# Patient Record
Sex: Male | Born: 1935 | Race: White | Hispanic: No | Marital: Married | Smoking: Former smoker
Health system: Southern US, Community
[De-identification: ages and names within clinical notes are randomized; demographics above are authoritative.]

## PROBLEM LIST (undated history)

## (undated) DIAGNOSIS — I482 Chronic atrial fibrillation, unspecified: Secondary | ICD-10-CM

## (undated) DIAGNOSIS — E559 Vitamin D deficiency, unspecified: Secondary | ICD-10-CM

## (undated) DIAGNOSIS — I259 Chronic ischemic heart disease, unspecified: Secondary | ICD-10-CM

## (undated) DIAGNOSIS — E291 Testicular hypofunction: Secondary | ICD-10-CM

## (undated) DIAGNOSIS — Z95810 Presence of automatic (implantable) cardiac defibrillator: Secondary | ICD-10-CM

## (undated) DIAGNOSIS — J90 Pleural effusion, not elsewhere classified: Secondary | ICD-10-CM

## (undated) DIAGNOSIS — I4821 Permanent atrial fibrillation: Secondary | ICD-10-CM

## (undated) DIAGNOSIS — F0390 Unspecified dementia without behavioral disturbance: Secondary | ICD-10-CM

## (undated) DIAGNOSIS — C6290 Malignant neoplasm of unspecified testis, unspecified whether descended or undescended: Secondary | ICD-10-CM

## (undated) DIAGNOSIS — E119 Type 2 diabetes mellitus without complications: Secondary | ICD-10-CM

## (undated) DIAGNOSIS — I10 Essential (primary) hypertension: Secondary | ICD-10-CM

## (undated) DIAGNOSIS — J439 Emphysema, unspecified: Secondary | ICD-10-CM

## (undated) DIAGNOSIS — R918 Other nonspecific abnormal finding of lung field: Secondary | ICD-10-CM

## (undated) DIAGNOSIS — I5020 Unspecified systolic (congestive) heart failure: Secondary | ICD-10-CM

## (undated) DIAGNOSIS — E785 Hyperlipidemia, unspecified: Secondary | ICD-10-CM

## (undated) DIAGNOSIS — I2510 Atherosclerotic heart disease of native coronary artery without angina pectoris: Secondary | ICD-10-CM

## (undated) DIAGNOSIS — F03A0 Unspecified dementia, mild, without behavioral disturbance, psychotic disturbance, mood disturbance, and anxiety: Secondary | ICD-10-CM

## (undated) DIAGNOSIS — I00 Rheumatic fever without heart involvement: Secondary | ICD-10-CM

## (undated) DIAGNOSIS — F329 Major depressive disorder, single episode, unspecified: Secondary | ICD-10-CM

## (undated) DIAGNOSIS — A809 Acute poliomyelitis, unspecified: Secondary | ICD-10-CM

## (undated) DIAGNOSIS — F32A Depression, unspecified: Secondary | ICD-10-CM

## (undated) HISTORY — DX: Unspecified systolic (congestive) heart failure: I50.20

## (undated) HISTORY — DX: Acute poliomyelitis, unspecified: A80.9

## (undated) HISTORY — DX: Essential (primary) hypertension: I10

## (undated) HISTORY — DX: Emphysema, unspecified: J43.9

## (undated) HISTORY — DX: Pleural effusion, not elsewhere classified: J90

## (undated) HISTORY — DX: Unspecified dementia without behavioral disturbance: F03.90

## (undated) HISTORY — DX: Depression, unspecified: F32.A

## (undated) HISTORY — DX: Chronic atrial fibrillation, unspecified: I48.20

## (undated) HISTORY — DX: Unspecified dementia, mild, without behavioral disturbance, psychotic disturbance, mood disturbance, and anxiety: F03.A0

## (undated) HISTORY — DX: Hyperlipidemia, unspecified: E78.5

## (undated) HISTORY — DX: Rheumatic fever without heart involvement: I00

## (undated) HISTORY — DX: Malignant neoplasm of unspecified testis, unspecified whether descended or undescended: C62.90

## (undated) HISTORY — DX: Vitamin D deficiency, unspecified: E55.9

## (undated) HISTORY — DX: Type 2 diabetes mellitus without complications: E11.9

## (undated) HISTORY — DX: Presence of automatic (implantable) cardiac defibrillator: Z95.810

## (undated) HISTORY — DX: Other nonspecific abnormal finding of lung field: R91.8

## (undated) HISTORY — DX: Testicular hypofunction: E29.1

## (undated) HISTORY — DX: Permanent atrial fibrillation: I48.21

## (undated) HISTORY — DX: Atherosclerotic heart disease of native coronary artery without angina pectoris: I25.10

## (undated) HISTORY — DX: Major depressive disorder, single episode, unspecified: F32.9

## (undated) HISTORY — DX: Chronic ischemic heart disease, unspecified: I25.9

---

## 1998-04-16 ENCOUNTER — Inpatient Hospital Stay (HOSPITAL_COMMUNITY): Admission: AD | Admit: 1998-04-16 | Discharge: 1998-04-17 | Payer: Self-pay | Admitting: *Deleted

## 1998-04-18 ENCOUNTER — Encounter (HOSPITAL_COMMUNITY): Admission: RE | Admit: 1998-04-18 | Discharge: 1998-07-17 | Payer: Self-pay

## 1998-07-02 ENCOUNTER — Emergency Department (HOSPITAL_COMMUNITY): Admission: EM | Admit: 1998-07-02 | Discharge: 1998-07-02 | Payer: Self-pay | Admitting: Emergency Medicine

## 1998-09-25 ENCOUNTER — Ambulatory Visit: Admission: RE | Admit: 1998-09-25 | Discharge: 1998-09-25 | Payer: Self-pay | Admitting: Otolaryngology

## 1998-10-23 ENCOUNTER — Other Ambulatory Visit: Admission: RE | Admit: 1998-10-23 | Discharge: 1998-10-23 | Payer: Self-pay | Admitting: Otolaryngology

## 1998-10-29 ENCOUNTER — Inpatient Hospital Stay (HOSPITAL_COMMUNITY): Admission: EM | Admit: 1998-10-29 | Discharge: 1998-11-04 | Payer: Self-pay

## 1998-10-30 ENCOUNTER — Encounter: Payer: Self-pay | Admitting: *Deleted

## 1998-10-31 ENCOUNTER — Encounter: Payer: Self-pay | Admitting: Internal Medicine

## 1998-12-17 ENCOUNTER — Encounter: Payer: Self-pay | Admitting: Cardiology

## 1998-12-17 ENCOUNTER — Ambulatory Visit (HOSPITAL_COMMUNITY): Admission: RE | Admit: 1998-12-17 | Discharge: 1998-12-17 | Payer: Self-pay | Admitting: Cardiology

## 1999-10-15 ENCOUNTER — Other Ambulatory Visit: Admission: RE | Admit: 1999-10-15 | Discharge: 1999-10-15 | Payer: Self-pay | Admitting: Urology

## 2000-10-11 ENCOUNTER — Encounter: Payer: Self-pay | Admitting: Internal Medicine

## 2000-10-11 ENCOUNTER — Ambulatory Visit (HOSPITAL_COMMUNITY): Admission: RE | Admit: 2000-10-11 | Discharge: 2000-10-11 | Payer: Self-pay | Admitting: Internal Medicine

## 2001-07-27 ENCOUNTER — Encounter (INDEPENDENT_AMBULATORY_CARE_PROVIDER_SITE_OTHER): Payer: Self-pay | Admitting: Specialist

## 2001-07-27 ENCOUNTER — Other Ambulatory Visit: Admission: RE | Admit: 2001-07-27 | Discharge: 2001-07-27 | Payer: Self-pay | Admitting: Urology

## 2002-06-19 ENCOUNTER — Encounter: Admission: RE | Admit: 2002-06-19 | Discharge: 2002-06-19 | Payer: Self-pay | Admitting: Cardiology

## 2002-06-19 ENCOUNTER — Encounter: Payer: Self-pay | Admitting: Cardiology

## 2002-06-22 ENCOUNTER — Inpatient Hospital Stay (HOSPITAL_COMMUNITY): Admission: RE | Admit: 2002-06-22 | Discharge: 2002-07-01 | Payer: Self-pay | Admitting: Cardiology

## 2002-06-23 ENCOUNTER — Encounter: Payer: Self-pay | Admitting: Cardiology

## 2002-06-23 ENCOUNTER — Encounter: Payer: Self-pay | Admitting: Cardiothoracic Surgery

## 2002-06-26 ENCOUNTER — Encounter: Payer: Self-pay | Admitting: Cardiothoracic Surgery

## 2002-06-26 HISTORY — PX: CORONARY ARTERY BYPASS GRAFT: SHX141

## 2002-06-27 ENCOUNTER — Encounter: Payer: Self-pay | Admitting: Cardiothoracic Surgery

## 2002-06-28 ENCOUNTER — Encounter: Payer: Self-pay | Admitting: Cardiothoracic Surgery

## 2002-06-29 ENCOUNTER — Encounter: Payer: Self-pay | Admitting: Cardiothoracic Surgery

## 2002-07-20 ENCOUNTER — Encounter: Admission: RE | Admit: 2002-07-20 | Discharge: 2002-07-20 | Payer: Self-pay | Admitting: Cardiothoracic Surgery

## 2002-07-20 ENCOUNTER — Encounter: Payer: Self-pay | Admitting: Cardiothoracic Surgery

## 2002-08-21 ENCOUNTER — Ambulatory Visit (HOSPITAL_COMMUNITY): Admission: RE | Admit: 2002-08-21 | Discharge: 2002-08-21 | Payer: Self-pay | Admitting: Ophthalmology

## 2002-09-18 ENCOUNTER — Ambulatory Visit (HOSPITAL_COMMUNITY): Admission: RE | Admit: 2002-09-18 | Discharge: 2002-09-19 | Payer: Self-pay | Admitting: Ophthalmology

## 2003-02-15 ENCOUNTER — Encounter: Payer: Self-pay | Admitting: Internal Medicine

## 2003-02-15 ENCOUNTER — Ambulatory Visit (HOSPITAL_COMMUNITY): Admission: RE | Admit: 2003-02-15 | Discharge: 2003-02-15 | Payer: Self-pay | Admitting: Internal Medicine

## 2003-03-12 ENCOUNTER — Encounter (INDEPENDENT_AMBULATORY_CARE_PROVIDER_SITE_OTHER): Payer: Self-pay | Admitting: Specialist

## 2003-03-12 ENCOUNTER — Ambulatory Visit (HOSPITAL_COMMUNITY): Admission: RE | Admit: 2003-03-12 | Discharge: 2003-03-12 | Payer: Self-pay | Admitting: Gastroenterology

## 2003-05-20 ENCOUNTER — Encounter: Payer: Self-pay | Admitting: Cardiology

## 2003-05-20 ENCOUNTER — Inpatient Hospital Stay (HOSPITAL_COMMUNITY): Admission: AD | Admit: 2003-05-20 | Discharge: 2003-05-27 | Payer: Self-pay | Admitting: Cardiology

## 2003-07-19 ENCOUNTER — Ambulatory Visit (HOSPITAL_COMMUNITY): Admission: RE | Admit: 2003-07-19 | Discharge: 2003-07-19 | Payer: Self-pay | Admitting: Cardiology

## 2003-09-16 ENCOUNTER — Encounter: Payer: Self-pay | Admitting: Internal Medicine

## 2003-09-16 ENCOUNTER — Ambulatory Visit (HOSPITAL_COMMUNITY): Admission: RE | Admit: 2003-09-16 | Discharge: 2003-09-16 | Payer: Self-pay | Admitting: Internal Medicine

## 2003-10-21 ENCOUNTER — Ambulatory Visit (HOSPITAL_COMMUNITY): Admission: RE | Admit: 2003-10-21 | Discharge: 2003-10-22 | Payer: Self-pay | Admitting: *Deleted

## 2004-02-25 ENCOUNTER — Emergency Department (HOSPITAL_COMMUNITY): Admission: AD | Admit: 2004-02-25 | Discharge: 2004-02-26 | Payer: Self-pay | Admitting: Emergency Medicine

## 2004-03-09 ENCOUNTER — Ambulatory Visit (HOSPITAL_COMMUNITY): Admission: RE | Admit: 2004-03-09 | Discharge: 2004-03-09 | Payer: Self-pay | Admitting: Internal Medicine

## 2004-03-25 ENCOUNTER — Encounter (INDEPENDENT_AMBULATORY_CARE_PROVIDER_SITE_OTHER): Payer: Self-pay | Admitting: *Deleted

## 2004-03-25 ENCOUNTER — Ambulatory Visit (HOSPITAL_COMMUNITY): Admission: RE | Admit: 2004-03-25 | Discharge: 2004-03-25 | Payer: Self-pay | Admitting: Urology

## 2004-11-29 ENCOUNTER — Inpatient Hospital Stay (HOSPITAL_COMMUNITY): Admission: EM | Admit: 2004-11-29 | Discharge: 2004-12-01 | Payer: Self-pay | Admitting: Emergency Medicine

## 2004-11-30 ENCOUNTER — Encounter (INDEPENDENT_AMBULATORY_CARE_PROVIDER_SITE_OTHER): Payer: Self-pay | Admitting: Cardiovascular Disease

## 2005-01-26 ENCOUNTER — Ambulatory Visit (HOSPITAL_COMMUNITY): Admission: RE | Admit: 2005-01-26 | Discharge: 2005-01-26 | Payer: Self-pay | Admitting: Ophthalmology

## 2005-07-02 ENCOUNTER — Encounter: Admission: RE | Admit: 2005-07-02 | Discharge: 2005-07-02 | Payer: Self-pay | Admitting: Cardiology

## 2005-07-19 ENCOUNTER — Ambulatory Visit (HOSPITAL_COMMUNITY): Admission: RE | Admit: 2005-07-19 | Discharge: 2005-07-19 | Payer: Self-pay | Admitting: Internal Medicine

## 2005-10-07 ENCOUNTER — Ambulatory Visit (HOSPITAL_COMMUNITY): Admission: RE | Admit: 2005-10-07 | Discharge: 2005-10-07 | Payer: Self-pay | Admitting: Surgery

## 2005-10-07 ENCOUNTER — Encounter (INDEPENDENT_AMBULATORY_CARE_PROVIDER_SITE_OTHER): Payer: Self-pay | Admitting: *Deleted

## 2005-11-02 ENCOUNTER — Encounter (INDEPENDENT_AMBULATORY_CARE_PROVIDER_SITE_OTHER): Payer: Self-pay | Admitting: Specialist

## 2005-11-02 ENCOUNTER — Ambulatory Visit (HOSPITAL_COMMUNITY): Admission: RE | Admit: 2005-11-02 | Discharge: 2005-11-03 | Payer: Self-pay | Admitting: Urology

## 2006-03-19 ENCOUNTER — Emergency Department (HOSPITAL_COMMUNITY): Admission: EM | Admit: 2006-03-19 | Discharge: 2006-03-19 | Payer: Self-pay | Admitting: Emergency Medicine

## 2006-03-31 ENCOUNTER — Encounter (INDEPENDENT_AMBULATORY_CARE_PROVIDER_SITE_OTHER): Payer: Self-pay | Admitting: Specialist

## 2006-03-31 ENCOUNTER — Inpatient Hospital Stay (HOSPITAL_COMMUNITY): Admission: RE | Admit: 2006-03-31 | Discharge: 2006-04-06 | Payer: Self-pay | Admitting: Urology

## 2006-04-21 ENCOUNTER — Inpatient Hospital Stay (HOSPITAL_COMMUNITY): Admission: EM | Admit: 2006-04-21 | Discharge: 2006-04-23 | Payer: Self-pay | Admitting: Emergency Medicine

## 2006-05-24 ENCOUNTER — Ambulatory Visit: Admission: RE | Admit: 2006-05-24 | Discharge: 2006-05-24 | Payer: Self-pay | Admitting: Urology

## 2006-07-01 ENCOUNTER — Ambulatory Visit (HOSPITAL_COMMUNITY): Admission: RE | Admit: 2006-07-01 | Discharge: 2006-07-01 | Payer: Self-pay | Admitting: Internal Medicine

## 2006-07-08 ENCOUNTER — Ambulatory Visit (HOSPITAL_COMMUNITY): Admission: RE | Admit: 2006-07-08 | Discharge: 2006-07-08 | Payer: Self-pay | Admitting: Gastroenterology

## 2006-07-08 ENCOUNTER — Encounter (INDEPENDENT_AMBULATORY_CARE_PROVIDER_SITE_OTHER): Payer: Self-pay | Admitting: *Deleted

## 2006-09-14 ENCOUNTER — Encounter: Admission: RE | Admit: 2006-09-14 | Discharge: 2006-09-14 | Payer: Self-pay | Admitting: Cardiology

## 2006-09-15 HISTORY — PX: NM MYOCAR PERF WALL MOTION: HXRAD629

## 2006-10-26 ENCOUNTER — Inpatient Hospital Stay (HOSPITAL_COMMUNITY): Admission: EM | Admit: 2006-10-26 | Discharge: 2006-11-03 | Payer: Self-pay | Admitting: Emergency Medicine

## 2006-11-02 ENCOUNTER — Ambulatory Visit: Payer: Self-pay | Admitting: Internal Medicine

## 2006-11-04 ENCOUNTER — Ambulatory Visit: Payer: Self-pay | Admitting: Family Medicine

## 2006-11-25 ENCOUNTER — Ambulatory Visit (HOSPITAL_COMMUNITY): Admission: RE | Admit: 2006-11-25 | Discharge: 2006-11-25 | Payer: Self-pay | Admitting: Family Medicine

## 2006-11-26 ENCOUNTER — Emergency Department (HOSPITAL_COMMUNITY): Admission: EM | Admit: 2006-11-26 | Discharge: 2006-11-26 | Payer: Self-pay | Admitting: Emergency Medicine

## 2007-01-05 ENCOUNTER — Ambulatory Visit (HOSPITAL_COMMUNITY): Admission: RE | Admit: 2007-01-05 | Discharge: 2007-01-05 | Payer: Self-pay | Admitting: Neurology

## 2007-01-05 ENCOUNTER — Ambulatory Visit: Admission: RE | Admit: 2007-01-05 | Discharge: 2007-01-05 | Payer: Self-pay | Admitting: Neurology

## 2007-02-06 ENCOUNTER — Encounter: Admission: RE | Admit: 2007-02-06 | Discharge: 2007-03-05 | Payer: Self-pay | Admitting: Internal Medicine

## 2007-03-06 ENCOUNTER — Encounter: Admission: RE | Admit: 2007-03-06 | Discharge: 2007-03-30 | Payer: Self-pay | Admitting: Internal Medicine

## 2007-08-25 ENCOUNTER — Ambulatory Visit (HOSPITAL_COMMUNITY): Admission: RE | Admit: 2007-08-25 | Discharge: 2007-08-25 | Payer: Self-pay | Admitting: Internal Medicine

## 2007-09-14 ENCOUNTER — Encounter: Admission: RE | Admit: 2007-09-14 | Discharge: 2007-09-14 | Payer: Self-pay | Admitting: Internal Medicine

## 2007-11-29 ENCOUNTER — Ambulatory Visit (HOSPITAL_COMMUNITY): Admission: RE | Admit: 2007-11-29 | Discharge: 2007-11-29 | Payer: Self-pay | Admitting: Cardiology

## 2008-03-04 ENCOUNTER — Inpatient Hospital Stay (HOSPITAL_COMMUNITY): Admission: EM | Admit: 2008-03-04 | Discharge: 2008-03-07 | Payer: Self-pay | Admitting: Emergency Medicine

## 2008-04-23 ENCOUNTER — Ambulatory Visit: Payer: Self-pay | Admitting: Internal Medicine

## 2008-04-25 ENCOUNTER — Ambulatory Visit: Payer: Self-pay | Admitting: Internal Medicine

## 2008-04-25 LAB — CONVERTED CEMR LAB
Basophils Absolute: 0 10*3/uL (ref 0.0–0.1)
CO2: 33 meq/L — ABNORMAL HIGH (ref 19–32)
Chloride: 104 meq/L (ref 96–112)
Lymphocytes Relative: 18.2 % (ref 12.0–46.0)
MCHC: 33.9 g/dL (ref 30.0–36.0)
Neutrophils Relative %: 70.3 % (ref 43.0–77.0)
Potassium: 3.2 meq/L — ABNORMAL LOW (ref 3.5–5.1)
Prothrombin Time: 12.1 s (ref 10.9–13.3)
RBC: 4.45 M/uL (ref 4.22–5.81)
RDW: 14.1 % (ref 11.5–14.6)
Sodium: 144 meq/L (ref 135–145)
aPTT: 30.7 s — ABNORMAL HIGH (ref 21.7–29.8)

## 2008-04-29 ENCOUNTER — Ambulatory Visit: Payer: Self-pay | Admitting: Internal Medicine

## 2008-04-29 LAB — CONVERTED CEMR LAB
CO2: 32 meq/L (ref 19–32)
Calcium: 8.5 mg/dL (ref 8.4–10.5)
Chloride: 107 meq/L (ref 96–112)
Sodium: 145 meq/L (ref 135–145)

## 2008-05-01 ENCOUNTER — Ambulatory Visit: Payer: Self-pay | Admitting: Internal Medicine

## 2008-05-01 ENCOUNTER — Inpatient Hospital Stay (HOSPITAL_COMMUNITY): Admission: RE | Admit: 2008-05-01 | Discharge: 2008-05-03 | Payer: Self-pay | Admitting: Internal Medicine

## 2008-05-01 HISTORY — PX: CARDIAC DEFIBRILLATOR PLACEMENT: SHX171

## 2008-05-11 ENCOUNTER — Inpatient Hospital Stay (HOSPITAL_COMMUNITY): Admission: EM | Admit: 2008-05-11 | Discharge: 2008-05-22 | Payer: Self-pay | Admitting: Pediatrics

## 2008-05-11 ENCOUNTER — Ambulatory Visit: Payer: Self-pay | Admitting: Pulmonary Disease

## 2008-05-11 ENCOUNTER — Ambulatory Visit: Payer: Self-pay | Admitting: Cardiology

## 2008-05-14 ENCOUNTER — Encounter (INDEPENDENT_AMBULATORY_CARE_PROVIDER_SITE_OTHER): Payer: Self-pay | Admitting: Pulmonary Disease

## 2008-05-22 ENCOUNTER — Telehealth: Payer: Self-pay | Admitting: Pulmonary Disease

## 2008-05-23 ENCOUNTER — Telehealth (INDEPENDENT_AMBULATORY_CARE_PROVIDER_SITE_OTHER): Payer: Self-pay | Admitting: *Deleted

## 2008-05-24 ENCOUNTER — Encounter: Payer: Self-pay | Admitting: Pulmonary Disease

## 2008-06-04 ENCOUNTER — Encounter: Payer: Self-pay | Admitting: Pulmonary Disease

## 2008-06-11 ENCOUNTER — Ambulatory Visit: Payer: Self-pay | Admitting: Pulmonary Disease

## 2008-06-12 ENCOUNTER — Ambulatory Visit: Payer: Self-pay

## 2008-10-15 ENCOUNTER — Ambulatory Visit: Payer: Self-pay | Admitting: Internal Medicine

## 2008-11-04 ENCOUNTER — Ambulatory Visit (HOSPITAL_COMMUNITY): Admission: RE | Admit: 2008-11-04 | Discharge: 2008-11-04 | Payer: Self-pay | Admitting: Internal Medicine

## 2008-11-27 ENCOUNTER — Ambulatory Visit (HOSPITAL_COMMUNITY): Admission: RE | Admit: 2008-11-27 | Discharge: 2008-11-27 | Payer: Self-pay | Admitting: Internal Medicine

## 2009-04-10 ENCOUNTER — Encounter: Admission: RE | Admit: 2009-04-10 | Discharge: 2009-04-10 | Payer: Self-pay | Admitting: Orthopaedic Surgery

## 2009-06-11 IMAGING — CR DG CHEST 1V PORT
1 series · 1 of 1 positions shown · non-contrast
Comparison: 05/14/2008

CLINICAL DATA: Respiratory distress/pneumonia with

PORTABLE CHEST - 1 VIEW

[AP]
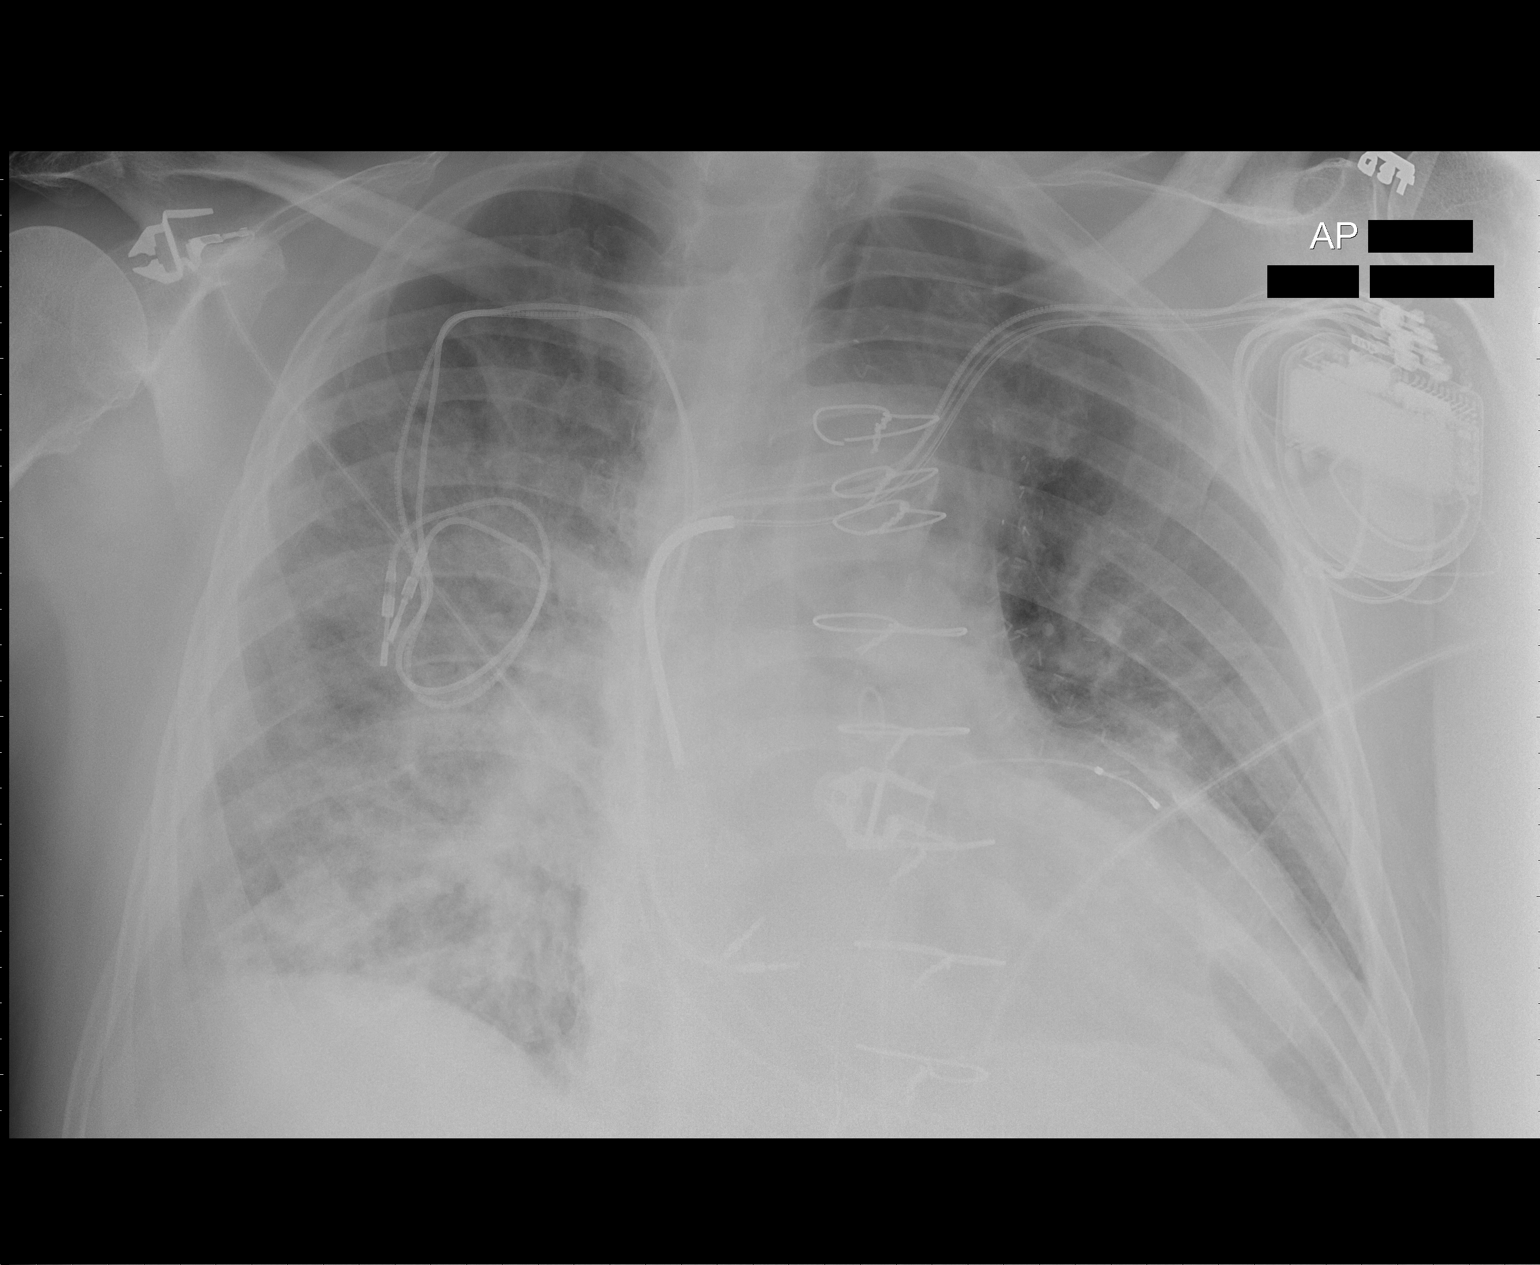

[1 of 1 positions shown; findings below may reference images not displayed]

FINDINGS: Heart remains enlarged.  Bilateral airspace densities are
again noted with little interval change allowing for differences in
technique.  AICD unchanged.  No new findings.
IMPRESSION: Cardiomegaly with bilateral airspace disease - right greater than
left.  No significant change allowing for some differences in
technique.

## 2009-12-09 ENCOUNTER — Inpatient Hospital Stay (HOSPITAL_COMMUNITY): Admission: EM | Admit: 2009-12-09 | Discharge: 2009-12-10 | Payer: Self-pay | Admitting: Emergency Medicine

## 2009-12-09 ENCOUNTER — Ambulatory Visit: Payer: Self-pay | Admitting: Cardiovascular Disease

## 2009-12-10 ENCOUNTER — Encounter (INDEPENDENT_AMBULATORY_CARE_PROVIDER_SITE_OTHER): Payer: Self-pay | Admitting: Internal Medicine

## 2009-12-10 ENCOUNTER — Ambulatory Visit: Payer: Self-pay | Admitting: Vascular Surgery

## 2010-07-18 ENCOUNTER — Emergency Department (HOSPITAL_COMMUNITY): Admission: EM | Admit: 2010-07-18 | Discharge: 2010-07-18 | Payer: Self-pay | Admitting: Emergency Medicine

## 2010-08-18 ENCOUNTER — Encounter: Admission: RE | Admit: 2010-08-18 | Discharge: 2010-08-18 | Payer: Self-pay | Admitting: Neurology

## 2011-02-26 LAB — BASIC METABOLIC PANEL
CO2: 29 mEq/L (ref 19–32)
Calcium: 9.3 mg/dL (ref 8.4–10.5)
Chloride: 103 mEq/L (ref 96–112)
GFR calc Af Amer: >60 mL/min (ref >60)
Sodium: 141 mEq/L (ref 135–145)

## 2011-02-26 LAB — CBC
Hemoglobin: 14.7 g/dL (ref 13.0–17.0)
MCH: 31.1 pg (ref 26.0–34.0)
RBC: 4.73 MIL/uL (ref 4.22–5.81)

## 2011-02-26 LAB — DIFFERENTIAL
Eosinophils Absolute: 0.1 10*3/uL (ref 0.0–0.7)
Lymphocytes Relative: 16 % (ref 12–46)
Lymphs Abs: 1.1 10*3/uL (ref 0.7–4.0)
Monocytes Relative: 10 % (ref 3–12)
Neutrophils Relative %: 73 % (ref 43–77)

## 2011-03-15 LAB — GLUCOSE, CAPILLARY
Glucose-Capillary: 110 mg/dL — ABNORMAL HIGH (ref 70–99)
Glucose-Capillary: 111 mg/dL — ABNORMAL HIGH (ref 70–99)
Glucose-Capillary: 130 mg/dL — ABNORMAL HIGH (ref 70–99)
Glucose-Capillary: 173 mg/dL — ABNORMAL HIGH (ref 70–99)
Glucose-Capillary: 231 mg/dL — ABNORMAL HIGH (ref 70–99)

## 2011-03-15 LAB — CARDIAC PANEL(CRET KIN+CKTOT+MB+TROPI)
CK, MB: 2.6 ng/mL (ref 0.3–4.0)
CK, MB: 3 ng/mL (ref 0.3–4.0)
Relative Index: INVALID (ref 0.0–2.5)
Total CK: 65 U/L (ref 7–232)
Troponin I: 0.04 ng/mL (ref 0.00–0.06)

## 2011-03-15 LAB — CBC
HCT: 38.7 % — ABNORMAL LOW (ref 39.0–52.0)
Hemoglobin: 13.2 g/dL (ref 13.0–17.0)
MCV: 91 fL (ref 78.0–100.0)
RBC: 4.25 MIL/uL (ref 4.22–5.81)
WBC: 5.9 10*3/uL (ref 4.0–10.5)

## 2011-03-15 LAB — BASIC METABOLIC PANEL
BUN: 16 mg/dL (ref 6–23)
Chloride: 104 mEq/L (ref 96–112)
GFR calc Af Amer: >60 mL/min (ref >60)
Potassium: 3.4 mEq/L — ABNORMAL LOW (ref 3.5–5.1)
Sodium: 139 mEq/L (ref 135–145)

## 2011-03-15 LAB — DIFFERENTIAL
Eosinophils Absolute: 0.2 10*3/uL (ref 0.0–0.7)
Eosinophils Relative: 3 % (ref 0–5)
Lymphocytes Relative: 21 % (ref 12–46)
Lymphs Abs: 1.2 10*3/uL (ref 0.7–4.0)
Monocytes Absolute: 0.4 10*3/uL (ref 0.1–1.0)
Monocytes Relative: 7 % (ref 3–12)

## 2011-03-15 LAB — POCT CARDIAC MARKERS
CKMB, poc: 2.1 ng/mL (ref 1.0–8.0)
CKMB, poc: 2.6 ng/mL (ref 1.0–8.0)
Troponin i, poc: <0.05 ng/mL (ref 0.00–0.09)
Troponin i, poc: <0.05 ng/mL (ref 0.00–0.09)

## 2011-03-15 LAB — LIPID PANEL
HDL: 54 mg/dL (ref >39)
Triglycerides: 75 mg/dL (ref <150)
VLDL: 15 mg/dL (ref 0–40)

## 2011-03-15 LAB — URINALYSIS, ROUTINE W REFLEX MICROSCOPIC
Glucose, UA: NEGATIVE mg/dL
Hgb urine dipstick: NEGATIVE
Ketones, ur: NEGATIVE mg/dL
Protein, ur: NEGATIVE mg/dL
pH: 5 (ref 5.0–8.0)

## 2011-03-15 LAB — TSH: TSH: 1.799 u[IU]/mL (ref 0.350–4.500)

## 2011-04-03 ENCOUNTER — Other Ambulatory Visit: Payer: Self-pay | Admitting: Internal Medicine

## 2011-04-16 ENCOUNTER — Emergency Department (HOSPITAL_COMMUNITY): Payer: Medicare Other

## 2011-04-16 ENCOUNTER — Inpatient Hospital Stay (HOSPITAL_COMMUNITY)
Admission: EM | Admit: 2011-04-16 | Discharge: 2011-04-19 | DRG: 293 | Disposition: A | Discharge status: Home / Self Care | Payer: Medicare Other | Attending: Cardiology | Admitting: Cardiology

## 2011-04-16 DIAGNOSIS — I2589 Other forms of chronic ischemic heart disease: Secondary | ICD-10-CM | POA: Diagnosis present

## 2011-04-16 DIAGNOSIS — K219 Gastro-esophageal reflux disease without esophagitis: Secondary | ICD-10-CM | POA: Diagnosis present

## 2011-04-16 DIAGNOSIS — E119 Type 2 diabetes mellitus without complications: Secondary | ICD-10-CM | POA: Diagnosis present

## 2011-04-16 DIAGNOSIS — I10 Essential (primary) hypertension: Secondary | ICD-10-CM | POA: Diagnosis present

## 2011-04-16 DIAGNOSIS — I2510 Atherosclerotic heart disease of native coronary artery without angina pectoris: Secondary | ICD-10-CM | POA: Diagnosis present

## 2011-04-16 DIAGNOSIS — E039 Hypothyroidism, unspecified: Secondary | ICD-10-CM | POA: Diagnosis present

## 2011-04-16 DIAGNOSIS — Z8673 Personal history of transient ischemic attack (TIA), and cerebral infarction without residual deficits: Secondary | ICD-10-CM

## 2011-04-16 DIAGNOSIS — Z95810 Presence of automatic (implantable) cardiac defibrillator: Secondary | ICD-10-CM

## 2011-04-16 DIAGNOSIS — I5023 Acute on chronic systolic (congestive) heart failure: Principal | ICD-10-CM | POA: Diagnosis present

## 2011-04-16 DIAGNOSIS — Z951 Presence of aortocoronary bypass graft: Secondary | ICD-10-CM

## 2011-04-16 DIAGNOSIS — I4891 Unspecified atrial fibrillation: Secondary | ICD-10-CM | POA: Diagnosis present

## 2011-04-16 DIAGNOSIS — I509 Heart failure, unspecified: Secondary | ICD-10-CM | POA: Diagnosis present

## 2011-04-16 DIAGNOSIS — G4730 Sleep apnea, unspecified: Secondary | ICD-10-CM | POA: Diagnosis present

## 2011-04-16 DIAGNOSIS — F0390 Unspecified dementia without behavioral disturbance: Secondary | ICD-10-CM | POA: Diagnosis present

## 2011-04-16 LAB — POCT CARDIAC MARKERS
CKMB, poc: 1.3 ng/mL (ref 1.0–8.0)
Myoglobin, poc: 110 ng/mL (ref 12–200)
Troponin i, poc: <0.05 ng/mL (ref 0.00–0.09)
Troponin i, poc: <0.05 ng/mL (ref 0.00–0.09)

## 2011-04-16 LAB — COMPREHENSIVE METABOLIC PANEL
ALT: 6 U/L (ref 0–53)
AST: 12 U/L (ref 0–37)
Albumin: 3.5 g/dL (ref 3.5–5.2)
Chloride: 111 mEq/L (ref 96–112)
Creatinine, Ser: 1.28 mg/dL (ref 0.4–1.5)
GFR calc Af Amer: >60 mL/min (ref >60)
Sodium: 148 mEq/L — ABNORMAL HIGH (ref 135–145)
Total Bilirubin: 1 mg/dL (ref 0.3–1.2)

## 2011-04-16 LAB — CBC
Hemoglobin: 12.9 g/dL — ABNORMAL LOW (ref 13.0–17.0)
MCH: 29.1 pg (ref 26.0–34.0)
Platelets: 153 10*3/uL (ref 150–400)
RBC: 4.43 MIL/uL (ref 4.22–5.81)
WBC: 6.6 10*3/uL (ref 4.0–10.5)

## 2011-04-16 LAB — BASIC METABOLIC PANEL
Calcium: 8.8 mg/dL (ref 8.4–10.5)
Chloride: 109 mEq/L (ref 96–112)
Creatinine, Ser: 1.19 mg/dL (ref 0.4–1.5)
GFR calc Af Amer: >60 mL/min (ref >60)

## 2011-04-16 LAB — DIFFERENTIAL
Basophils Absolute: 0 10*3/uL (ref 0.0–0.1)
Basophils Relative: 0 % (ref 0–1)
Eosinophils Absolute: 0.2 10*3/uL (ref 0.0–0.7)
Neutro Abs: 5 10*3/uL (ref 1.7–7.7)
Neutrophils Relative %: 76 % (ref 43–77)

## 2011-04-16 LAB — TROPONIN I: Troponin I: <0.3 ng/mL (ref <0.30)

## 2011-04-16 LAB — CK TOTAL AND CKMB (NOT AT ARMC)
Relative Index: INVALID (ref 0.0–2.5)
Total CK: 48 U/L (ref 7–232)

## 2011-04-17 LAB — MAGNESIUM: Magnesium: 2.1 mg/dL (ref 1.5–2.5)

## 2011-04-17 LAB — CARDIAC PANEL(CRET KIN+CKTOT+MB+TROPI)
CK, MB: 3.1 ng/mL (ref 0.3–4.0)
Relative Index: INVALID (ref 0.0–2.5)
Troponin I: <0.3 ng/mL (ref <0.30)

## 2011-04-17 LAB — TSH: TSH: 1.738 u[IU]/mL (ref 0.350–4.500)

## 2011-04-17 LAB — GLUCOSE, CAPILLARY: Glucose-Capillary: 139 mg/dL — ABNORMAL HIGH (ref 70–99)

## 2011-04-18 LAB — GLUCOSE, CAPILLARY
Glucose-Capillary: 189 mg/dL — ABNORMAL HIGH (ref 70–99)
Glucose-Capillary: 207 mg/dL — ABNORMAL HIGH (ref 70–99)

## 2011-04-18 LAB — BASIC METABOLIC PANEL
CO2: 31 mEq/L (ref 19–32)
Calcium: 9 mg/dL (ref 8.4–10.5)
Chloride: 107 mEq/L (ref 96–112)
GFR calc Af Amer: >60 mL/min (ref >60)
Sodium: 143 mEq/L (ref 135–145)

## 2011-04-19 LAB — BASIC METABOLIC PANEL
BUN: 22 mg/dL (ref 6–23)
CO2: 29 mEq/L (ref 19–32)
Chloride: 103 mEq/L (ref 96–112)
Creatinine, Ser: 1.27 mg/dL (ref 0.4–1.5)
Glucose, Bld: 156 mg/dL — ABNORMAL HIGH (ref 70–99)

## 2011-04-20 NOTE — H&P (Signed)
NAME:  Troy Warren, Troy Warren NO.:  [id number]  MEDICAL RECORD NO.:  [id number]           PATIENT TYPE:  E  LOCATION:  [facility]                         FACILITY:  [facility]  PHYSICIAN:  Landry Corporal, MD DATE OF BIRTH:  Sep 11, 1931  DATE OF ADMISSION:  04/16/2011 DATE OF DISCHARGE:                             HISTORY & PHYSICAL   CHIEF COMPLAINTS:  Weakness and shortness of breath.  HISTORY OF PRESENT ILLNESS:  Troy Warren is a 77 year old male followed by Dr. Clarene Warren with a history of bypass grafting in 2003.  He was cathed in 2007 and has been treated medically.  He had an LIMA to the LAD and a vein graft to the ramus intermedius.  He has had a pacemaker implanted in the past and he was upgraded to a BiV device in May 2009 by Dr. Graciela Warren for ischemic cardiomyopathy.  He has had a past history of intercerebral bleed and is subsequently troubled with dementia.  He is not a Coumadin candidate.  He has actually not been in the hospital since he was hospitalized for pneumonia in 2009.  The patient's family brought him into the emergency room tonight.  They said that he had been weak over the last week.  Today, his wife said he got out of the shower and he looked very weak and sweaty.  He has had some left-sided chest pain.  It is difficult for him to give a clear history.  In the emergency room, he is comfortable and his EKG is paced.  He is admitted now for further evaluations.  PAST MEDICAL HISTORY:  Remarkable for PAF.  He has type 2 insulin- dependent diabetes.  He has treated hypertension.  He has sleep apnea but is intolerant to see Pap.  He has gastroesophageal reflux.  He has treated hypothyroidism.  He has had previous gallbladder surgery, prostate surgery, and a tonsillectomy.  HOME MEDICATIONS: 1. Meloxicam 7.5 mg b.i.d. p.r.n. arthritis. 2. Benicar 40 mg a day. 3. Synthroid 0.075 mg a day. 4. Lasix 40 mg b.i.d. 5. Potassium 20 mEq b.i.d. 6. Imdur 60 mg a  day. 7. Aricept 10 mg a day. 8. Pravachol 40 mg a day. 9. Diltiazem 120 mg twice a day. 10.Hydralazine 25 mg twice a day. 11.Citalopram 40 mg at bedtime. 12.Labetalol 200 mg twice a day. 13.He also has listed losartan potassium 100 mg daily.  ALLERGIES:  He has no known drug allergies.  SOCIAL HISTORY:  He is married.  He is a nonsmoker.  He is retired from [employer].  FAMILY HISTORY:  Unremarkable.  REVIEW OF SYSTEMS:  Essentially unremarkable except for noted above.  PHYSICAL EXAMINATION:  VITAL SIGNS:  Blood pressure 150/76, pulse 70, temperature 98, and respirations 12. GENERAL:  He is a well-developed elderly male in no acute distress. HEENT:  Normocephalic.  Extraocular movements intact.  Sclerae are nonicteric.  Lids and conjunctivae within normal limits. NECK:  Without JVD or bruit. CHEST:  Without rales. CARDIAC:  Regular rate and rhythm with a soft systolic murmur to aortic valve area on left sternal border. ABDOMEN:  Nontender.  He is obese. EXTREMITIES:  Trace  edema bilaterally. NEUROLOGIC:  Grossly intact.  He is awake, alert, oriented, and cooperative.  IMAGING:  His EKG shows atrial fibrillation with V pacing.  LABORATORY DATA:  White count 6.6, hemoglobin 12.9, and hematocrit 39. INR 1.12.  Troponin is negative.  Chest x-ray shows cardiomegaly but no active disease.  IMPRESSION: 1. Fatigue and dyspnea on exertion, rule out anginal equivalent vs. Acute on Chronic Systolic & Diastolic Heart Failure.  a. Known ischemic cardiomyopathy with an ejection fraction of 25%,         b. Coronary disease with coronary artery bypass grafting in 2003 with      catheterization 2007, plans for medical therapy. 2. Atrial fibrillation, the patient not felt to be a Coumadin      candidate.  a. History of pacemaker implant in the past with an upgrade to a     biventricular device, May 2009 by Dr. Graciela Warren. 3. History of sleep apnea. 4. Treated hypothyroidism. 5.  History of cerebrovascular accident and intercerebral bleed with     subsequent dementia and poor memory.  PLAN:  The patient is admitted to Telemetry.  We will try and get his home medications straightened out with an accurate med rec sheet.  Once his renal function comes back, we will decide about a Lasix dose, he may need to be gently diuresed over the weekend. We will check cardiac biomarkers, check and echocardiogram. If biomarkers are negative, we will consider out-patient non-invasive stress testing.   If positive or with a significant new echocardiographic finding, consider invasive assessment.    Troy Warren, P.A.   ______________________________ Landry Corporal, MD    LKK/MEDQ  D:  04/16/2011  T:  04/16/2011  Job:  [id number]  cc:   Thereasa Solo. Little, M.D.  Electronically Signed by Corine Shelter P.A. on 04/18/2011 10:30:54 AM Electronically Signed by Bryan Lemma MD on 04/20/2011 09:18:47 AM

## 2011-04-27 NOTE — Letter (Signed)
October 15, 2008    Thereasa Solo. Little, MD  [address]  [address]  [place], [place] [address]   RE:  Troy, Warren  MRN:  [id number]  /  DOB:  1931/10/10   Dear [name]:   Troy Warren comes in following CRTD upgrade accomplished in May 2009.  You have seen him in intercurrently and according to his wife he is much  improved in terms of his functional capacity.  He obviously doesn't  remember.   His medications are reviewed and are unchanged apart from the  intercurrent, addition of labetalol, dose unknown, Imdur titrated from  30-60, Lasix down titrated from 80-40, and the potassium comfortably.   PHYSICAL EXAMINATION:  VITAL SIGNS:  Today, his blood pressure was  136/61 with the pulse of 77.  LUNGS:  Clear.  HEART:  Sounds were regular.  Device pocket was well-healed.  EXTREMITIES:  Without edema.   Interrogation of his Medtronic Concerta device demonstrates a P-wave of  1.8 with impedance of 480, a threshold of 0.5 at 0.4, the R-wave was  14.7 with impedance of 408, threshold of 1.5 at 0.5, and the LV  impedance of 560 with threshold 1.5 at 0.7.  Battery voltage was 3.17.  Device was reprogrammed for maximum longevity.   IMPRESSION:  1. Congestive heart failure - chronic - systolic - improved.  2. Ischemic heart disease with      a.     Prior bypass surgery.      b.     Depressed left ventricular function.  3. Status post CRT for the above.  4. PAF, on amiodarone without options for Coumadin because of      intracerebral hemorrhage.   Mr. [name] is stable from a heart failure point of view.  His device  has been reprogrammed.  He will follow up with Dr. Clarene Duke and I will be  glad to see him again at Dr. Fredirick Maudlin request.    Sincerely,      Duke Salvia, MD, [facility]  Electronically Signed    SCK/MedQ  DD: 10/15/2008  DT: 10/16/2008  Job #: [id number]

## 2011-04-27 NOTE — Letter (Signed)
Apr 23, 2008    Thereasa Solo. Little, M.D.  [address]  [address]  [place],  [place] [address]   RE:  Troy, Warren  MRN:  [id number]  /  DOB:  06-24-1931   Dear [name]:   It was a pleasure seeing Troy Warren with his wife and daughter  today for consideration of upgrade of his Medtronic pacemaker, given his  congestive heart failure and his ischemic heart disease.   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  He has significant limiting congestive symptoms,  being short of breath at about 100 feet, nocturnal dyspnea, some  orthopnea but no significant peripheral edema.   He is had no concomitant chest pain.  He has had no syncope.   PAST MEDICAL HISTORY:  1. Prostate surgery, status post prostatectomy.  2. Obstructive sleep apnea, though he says he only wears nocturnal O2      as opposed to CPAP.  3. Diabetes.  4. Stomach disorders with GE reflux disease.  5. Fatigue.  6. Thyroid disease.   PAST MEDICAL HISTORY:  1. Bypass surgery.  2. Cholecystectomy.  3. Prostate surgery.  4. Tonsillectomy.   SOCIAL HISTORY:  He is retired from the [employer].  He has two  stepdaughters and is married.  He does not use cigarettes, alcohol, or  recreational drugs.   REVIEW OF SYSTEMS:  His review of systems is broadly negative, apart  from the above and related to his neurological deficits.   MEDICATIONS:  1. Coreg 12.5 b.i.d.  2. Furosemide 80 b.i.d.  3. Potassium 10.  4. Isosorbide 30.  5. Pravastatin 40.  6. Levothyroxine 75.  7. Cozaar 100.  8. Lisinopril 20.  9. Insulin.  10.Aricept 10.  11.Pacerone 200, presumably for atrial fibrillation, though I do not      have this on record.  12.Benadryl.   ALLERGIES:  NO KNOWN DRUG ALLERGIES.   PHYSICAL EXAMINATION:  GENERAL:  He  is an older Caucasian male appearing  older than his stated age of 45.  VITAL SIGNS:  His blood pressure is 156/84,  pulse of 84.  NECK:  Neck veins were 7-8 cm.  His carotids were brisk.  HEENT:  No icterus or xanthomata.  BACK:  Without kyphosis or scoliosis.  LUNGS:  Clear.  CARDIOVASCULAR:  His heart sounds were regular with a displaced PMI and  a 2/6 systolic murmur heard along the left lower sternal border.  ABDOMEN:  Protuberant but soft.  EXTREMITIES:  Femoral pulses were trace.  Distal pulses were intact.  There was no clubbing, cyanosis or edema.  NEUROLOGIC:  Notable for him having a poor memory, not being able to  follow the conversation that he was having with his wife in his  daughter.   Electrocardiogram today demonstrated AV pacing.   Interrogation of his device demonstrates that he has an intrinsic rhythm  at about 30 beats per minute with a PR interval of 250 milliseconds;  however, pacing at 60 beats per minute, he does not conduct with an AV  interval of 350 milliseconds.   IMPRESSION:  1. Sinus node dysfunction with prolonged antegrade atrioventricular      conduction.  2. Status post pacer for the above, resulting in 100% ventricular  pacing.  3. Ischemic heart disease with (a) prior bypass, (b) ejection fraction      of 25-30%.  4. Congestive heart failure - systolic - chronic - class III.  5. Question paroxysmal atrial fibrillation on amiodarone.  6. Status post stroke.  7. No Coumadin therapy secondary to intracerebral hemorrhage.   Al, Mr. Cavell certainly might benefit from CRT upgrade of his  pacemaker.  The data are that about 50% of these people will have  significant improvement in their functional capacity following  resynchronization of an RV apically paced rhythm.  This is somewhat  lower than the general left bundle branch block patients as you know.  We also discussed the role of ICD implantation with prevention of sudden  death in  this gentleman who is quite devastated by a stroke, but his  wife and his daughter are both quite clear that they would like to  pursue ICD implantation at the same time.  Given the concomitant  ventricular pacing wires that would be needed, I would plan to go in  from the left side with an entirely new system and then explant the  right-sided pacemaker.  I have reviewed this with them as well as the  other potential risks including but not limited to death, perforation,  infection, diaphragmatic stimulation, device malfunction, and vascular  injury.  They understand these risks and would like to proceed.   Al, thank you very much for asking [place] to see him, and we will plan to  insert his Medtronic CRT device in the near future.    Sincerely,      Duke Salvia, MD, [facility]  Electronically Signed    SCK/MedQ  DD: 04/23/2008  DT: 04/23/2008  Job #: [id number]

## 2011-04-27 NOTE — Discharge Summary (Signed)
NAME:  Troy Warren, Troy Warren NO.:  [id number]   MEDICAL RECORD NO.:  [id number]          PATIENT TYPE:  INP   LOCATION:  4709                         FACILITY:  [facility]   PHYSICIAN:  Coralyn Helling, MD        DATE OF BIRTH:  12-Sep-1931   DATE OF ADMISSION:  05/11/2008  DATE OF DISCHARGE:  05/22/2008                               DISCHARGE SUMMARY   FINAL DIAGNOSES:  1. Resolved acute respiratory failure secondary to community-acquired      pneumonia (no organism specified, complicated by pulmonary edema      and decompensated congestive heart failure.  2. Hypertension.  3. Diabetes.  4. Hypothyroidism.   PROCEDURES:  Endotracheal tube placed on May 11, 2008 and removed on May 12, 2008; left radial A-line placed on May 11, 2008 and removed on May 13, 2008.   LABORATORY DATA:  Blood cultures x2 on May 11, 2008 were negative.  Urine culture on May 11, 2008 negative.  Bronchioalveolar lavage  negative, this was obtained on May 11, 2008, as well as urine strep and  Legionella antigens, both negative.   BRIEF HISTORY:  A 77 year old male patient admitted on May 11, 2008 with  right lower lobe pneumonia.  He was intubated in the emergency room upon  arrival secondary to progressive respiratory distress.  Pulmonary  critical care service was asked to evaluate.   PAST MEDICAL HISTORY:  Ischemic cardiomyopathy, recently underwent  hospitalization for insertion/update of permanent pacemaker.  Additional  past medical history consists of coronary artery disease, coronary  artery bypass grafting, cerebrovascular accident, hypertension,  hyperlipidemia, diabetes, prostate cancer, and paroxysmal atrial  fibrillation, status post AICD in the past.   HOSPITAL COURSE BY DISCHARGE DIAGNOSIS:  1. Acute respiratory failure secondary to community-acquired      pneumonia, complicated by decompensated congestive heart failure.      [name] was admitted to the intensive care  following      endotracheal intubation in the emergency room.  He was maintained      on mechanical ventilation x24 hours and successfully extubated      after that point.  Therapy consisted of intravenous antibiotics in      the form of vancomycin from May 11, 2008 to May 20, 2008, also      Rocephin from May 11, 2008 to May 20, 2008.  He continue to improve      over the course of his hospitalization.  His regimen also consisted      of aggressive intravenous diuresis, bronchodilators, and titration      of supplemental oxygen.  Upon time of discharge, he did require      supplemental oxygen, given ambulatory saturations, did drop into      the 80s on room air.  He was discharged to home after reevaluating      his medication regimen, as well as completing appropriate course of      therapy for pneumonia without organism specified.  He was      discharged to home with followup within the outpatient setting with  Dr. Craige Cotta.  2. Hypertension.  For this, he was maintained on his primary      antihypertensive regimen.  3. Hypothyroidism.  For this, he was maintained on Synthroid.  4. Diabetes.  For this, he was maintained on his typical regimen.   DISCHARGE INSTRUCTIONS:  Diet, no concentrated sweet, low sodium.   DISCHARGE FOLLOWUP:  Dr. Graciela Husbands with [facility] on June 12, 2008  and Dr. Craige Cotta on June 11, 2008.   DISCHARGE MEDICATIONS:  1. Synthroid 75 mcg daily.  2. Aspirin 81 mg daily.  3. Normodyne 200 mg every 8 hours.  4. Potassium chloride 20 mEq daily.  5. Imdur 60 mg daily.  6. NovoLog insulin per typical scale.  7. Aricept 10 mg daily.  8. Pravachol 40 mg daily.  9. Oxygen at all times at 2 L.   DISPOSITION:  [name] had met maximum benefit from inpatient stay.  He was discharged to home with followup with mentioned.      Zenia Resides, NP      Coralyn Helling, MD  Electronically Signed    PB/MEDQ  D:  06/11/2008  T:  06/11/2008  Job:  [id number]

## 2011-04-27 NOTE — Discharge Summary (Signed)
NAME:  Troy Warren, Troy Warren NO.:  [id number]   MEDICAL RECORD NO.:  [id number]          PATIENT TYPE:  INP   LOCATION:  2002                         FACILITY:  [facility]   PHYSICIAN:  Duke Salvia, MD, FACCDATE OF BIRTH:  06/03/1931   DATE OF ADMISSION:  05/01/2008  DATE OF DISCHARGE:  05/03/2008                               DISCHARGE SUMMARY   PROCEDURES:  1. Upgrade of existing pacemaker.  2. Medtronic biventricular device.   SECONDARY DIAGNOSES:  1. Sinus node dysfunction with prolonged antegrade atrioventricular      conduction.  2. Ischemic cardiomyopathy with an ejection fraction of 25-30%.  3. Chronic systolic congestive heart failure.  4. Paroxysmal atrial fibrillation.  5. History of cerebrovascular accident.  6. Status post intracerebral hemorrhage, therefore no Coumadin.  7. Status post aortocoronary bypass surgery in 2003 with left internal      mammary artery to left anterior descending artery and saphenous      vein graft to ramus intermedius.  8. Insulin-dependent diabetes.  9. Cataracts.  10.Hypertension.  11.History of pericardial effusion secondary to minoxidil, resolved.  12.Aortic insufficiency.   TIME AT DISCHARGE:  52 minutes.   HOSPITAL COURSE:  Troy Warren is a 77 year old male with a history of  coronary artery disease and permanent pacemaker.  He was evaluated by  Dr. Graciela Husbands in the office and an upgrade to a biventricular CRT device was  indicated.  He came to the hospital for this on May 02, 2008.   The device was implanted without complication.  However, he had a pocket  hematoma post procedure.  Dr. Ladona Ridgel evaluated this and because the  pocket was no longer tense, direct pressure was used with a very tight  pressure dressing, but the pocket was not evacuated.  Dr. Ladona Ridgel felt  that the pocket would ultimately heal.   On May 03, 2008, there was a large area of ecchymosis because the blood  expressed into the tissue around the  incision, relieving pressure on the  incision.  He was watched for several hours and then Dr. Graciela Husbands felt that  he could be safely discharged home with close outpatient followup.   DISCHARGE INSTRUCTIONS:  1. His activity level is to be per the discharge instruction sheet      with keeping the incision clean and dry for a week.  He is to      increase his arm movements per the activity sheet.  He is to stick      to a low-sodium, heart-healthy diet.  He is to come to short stay      on Tuesday, May 07, 2008, at 7:30 a.m. and get Dr. Graciela Husbands to check      the wound then.  He also has an appointment for wound check and      pacer progression and device interrogation on May 23, 2008 at 2      p.m..  He is to follow up with Dr. Milinda Cave and Dr. Clarene Duke as      scheduled.   DISCHARGE MEDICATIONS:  1. Coreg 12.5 mg b.i.d.  2. Benadryl daily.  3. Vitamin D daily.  4. Klor-Con 10 mEq b.i.d.  5. Isosorbide 30 mg daily.  6. Pravastatin 40 mg daily.  7. Synthroid 75 mcg daily.  8. Cozaar 100 mg daily.  9. Pacerone 200 mg daily.  10.Fish oil and vitamins as well as flaxseed daily.  11.Aricept 10 mg daily.  12.Azopt and Alphagan eye drops b.i.d.  13.Novolin 28 units a.m. and 12 units p.m.  14.Xanax 1 mg 1/2 tablet nightly.  15.Celexa 40 mg nightly.  16.Lasix 40 mg 2 tablets b.i.d.  17.Lisinopril 20 mg daily  18.Iron daily.  19.Aspirin 81 mg daily.      Theodore Demark, PA-C      Duke Salvia, MD, [place] [facility]  Electronically Signed    RB/MEDQ  D:  05/03/2008  T:  05/04/2008  Job:  [id number]   cc:   Troy Warren, M.D.  Jeoffrey Massed, MD

## 2011-04-27 NOTE — Discharge Summary (Signed)
NAME:  GIONNI, VACA NO.:  [id number]   MEDICAL RECORD NO.:  [id number]          PATIENT TYPE:  INP   LOCATION:  2021                         FACILITY:  [facility]   PHYSICIAN:  Thereasa Solo. Little, M.D. DATE OF BIRTH:  01-26-1931   DATE OF ADMISSION:  03/04/2008  DATE OF DISCHARGE:  03/07/2008                               DISCHARGE SUMMARY   ADDENDUM   At discharge, we got Mr. Stamps' potassium back to 20 mEq once a day.  His potassium is 4.3 at discharge and his ACE inhibitor was just  increased.      Abelino Derrick, P.A.    ______________________________  Thereasa Solo. Little, M.D.    Lenard Lance  D:  03/07/2008  T:  03/08/2008  Job:  [id number]

## 2011-04-29 NOTE — Discharge Summary (Signed)
  NAME:  Troy Warren, CLINCH             ACCOUNT NO.:  [id number]  MEDICAL RECORD NO.:  [id number]           PATIENT TYPE:  I  LOCATION:  3732                         FACILITY:  [facility]  PHYSICIAN:  Thereasa Solo. Little, M.D. DATE OF BIRTH:  1931-07-29  DATE OF ADMISSION:  04/16/2011 DATE OF DISCHARGE:  04/19/2011                              DISCHARGE SUMMARY   DISCHARGE DIAGNOSES: 1. Acute-on-chronic systolic heart failure, improved at discharge. 2. History of an ischemic cardiomyopathy, status post biventricular     implantable cardioverter-defibrillator in May 2009 by Dr. 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 3. Chronic atrial fibrillation, not felt to be a Coumadin candidate. 4. Coronary disease with coronary artery bypass grafting in 2003,     catheterization in 2007.5. History of cerebrovascular accident in the past with an     intercerebral bleed and subsequent dementia and poor short-term     memory. 6. Sleep apnea. 7. Treated hypothyroidism.  HOSPITAL COURSE:  The patient is a pleasant 77 year old male followed by Dr. Clarene Duke.  He was admitted to the emergency room on the night of Apr 16, 2011.  His family brought him in because they noted he had some increasing dyspnea on exertion.  He had also had some lower extremity edema.  The patient was admitted to telemetry and started on IV diuretics for gentle diuresis.  Pro-BNP on admission was 4875.  The patient improved gradually with diaphoresis.  His creatinine remained stable.  His admission weight was 82.8 kg, weight at discharge is 77 kg. This correlates with his usual good dry weight at home of about 170.  We did increase the patient's daily Lasix from 40 twice a day to 80 in the morning and 40 in the evening.  He will see Dr. Clarene Duke back in about a week.  LABORATORY DATA ON  ADMISSION:  LFTs were normal.  Hemoglobin A1c was 6.1.  CK-MB and troponins were negative.  TSH is 1.73.  At discharge, sodium 140, potassium 3.7, BUN 22, creatinine 1.27.  White count 6.6, hemoglobin 12.9, hematocrit 39, platelets 153.  EKG shows V-pacing. Chest x-ray on Apr 16, 2011, shows cardiomegaly and vascular congestion. An echocardiogram is pending at the time of this dictation.  Please see med rec for complete discharge medications.    Abelino Derrick, P.A.   ______________________________ Thereasa Solo. Little, M.D.   Lenard Lance  D:  04/19/2011  T:  04/20/2011  Job:  [id number]  Electronically Signed by Corine Shelter P.A. on 04/21/2011 11:08:15 AM Electronically Signed by Julieanne Manson M.D. on 04/29/2011 03:51:32 PM

## 2011-04-30 NOTE — Discharge Summary (Signed)
[place]  Patient:    Troy Warren, Troy Warren Visit Number: [id number] MRN: [id number]          Service Type: MED Location: 2000 2001 01 Attending Physician:  Mikey Bussing Dictated by:   Maxwell Marion, RNFA Admit Date:  06/22/2002 Discharge Date: 07/01/2002   CC:         Julieanne Manson, M.D.  Marinus Maw, M.D.   Discharge Summary  DATE OF BIRTH:  06-Sep-1931  DATE OF SURGERY:  June 26, 2002  ADMITTING DIAGNOSES:  Chest pain and known aortic insufficiency.  PAST MEDICAL HISTORY: 1. Type 2 diabetes. 2. Bradycardia. 3. Hypertension.  PAST SURGICAL HISTORY:  Cholecystectomy and tonsillectomy.  ALLERGIES:  This patient has no known drug allergies.  DISCHARGE DIAGNOSIS:  Two-vessel coronary artery disease, status post coronary artery bypass graft.  HISTORY OF PRESENT ILLNESS:  The patient is a 77 year old Caucasian man.  The patient is initially referred by his primary care Timothey Dahlstrom to Dr. Clarene Duke for evaluation of bradycardia.  Dr. Clarene Duke performed a 2-D echocardiogram which showed a dilated left ventricle with mild global hypokinesia with an ejection fraction of 45%.  There was a questionable aortic insufficiency of 1 to 2+.  A repeat echocardiogram showed no aortic insufficiency, however.  During this time, the patient developed indigestion that occurred both at rest and during exercise.  To evaluate these symptoms, Dr. Clarene Duke recommended cardiac catheterization.  HOSPITAL COURSE:  On July 11, the patient was admitted to [facility] under the care of Dr. Julieanne Manson.  He underwent a cardiac catheterization which revealed two-vessel coronary artery disease and an ejection fraction of 45%, trace aortic insufficiency.  His coronary lesions were not amenable to PCI; therefore, CVTS consult was requested.  Dr. Donata Clay evaluated the patient.  After examination of the patient, he reviewed the available  records including catheterization films.  Dr. Donata Clay recommended coronary artery bypass grafting for a treatment choice in this gentleman.  Preoperative Doppler studies were performed which revealed no significant coronary artery disease, and his ABIs were noted to be greater than 1.0 bilaterally.  Of note on admission, his HBA1C was 6.6.  On July 15, the patient underwent an uncomplicated coronary artery bypass grafting x 2 with Dr. Kathlee Nations Trigt.  Grafts placed at the time of procedure:  Left internal mammary artery graft to the diagonal artery, saphenous vein graft to the ramus artery.  Vein was harvested for the bypass grafts from the right leg.  The patient tolerated this procedure well, was transferred in stable condition to the SICU.  He has remained hemodynamically stable since surgery and his postoperative course has been uneventful.  On the morning of July 19, the patient reports feeling very well.  His heart is in a regular rate and rhythm.  His lungs have fine crackles at the left base, otherwise clear.  He is tolerating a regular diet.  His bowel and bladder functions are within normal limits for him.  His incisions are all healing well.  He has staples intact on his right leg.  He is ambulating independently and his pain is well controlled.  If the patient continues to make progress, it is anticipated he will be ready for discharge home tomorrow, July 01, 2002.  RECENT LABORATORY STUDIES:  July 18, WBC is 9.0, hemoglobin 10.1, hematocrit 28.9, platelets 131.  Sodium 138, potassium 3.8, chloride 100, CO2 33, BUN 22, creatinine 1.0, glucose 90, calcium 8.3.  CONDITION ON DISCHARGE:  Improved.  DISCHARGE INSTRUCTIONS:  Activities:  He has been asked to refrain from any driving or any heavy lifting, pushing, or pulling.  He has been asked to continue his breathing exercises and daily walking.  His diet should be a diabetic diet.  Wound care:  He may shower with mild  soap and water.  He has been asked to examine his incisions daily.  If they become red, hot, swollen, draining, or if he has any fever greater than 101 degrees Fahrenheit, he has been asked to call Dr. Vincent Gros office.  DISCHARGE MEDICATIONS: 1. Tylox one to two p.o. q.4-6h. p.r.n. for pain. 2. Toprol XL 25 mg one p.o. q.d. 3. Coated aspirin 325 mg p.o. q.d.  He is instructed to resume the following home medications: 1. Actos 30 mg p.o. q.d. 2. Glyburide 5 mg p.o. b.i.d. 3. Altace 2.5 mg p.o. b.i.d. 4. Paxil 30 mg p.o. q.d. 5. Xanax 0.25 mg p.o. b.i.d. 6. Cardura 4 mg p.o. q.h.s.  FOLLOW-UP: 1. He has an appointment at the CVTS office for staple removal on Thursday,    July 24, at 10:30. 2. Dr. Clarene Duke would like to see him in the office in two weeks.  He has been    asked to call that office to arrange an appointment. 3. Dr. Donata Clay will see the patient at the CVTS office on Friday, August 8,    at 11:30.  He has been asked to have a chest x-ray ______ at 12:30 that    day. Dictated by:   Maxwell Marion, RNFA Attending Physician:  Mikey Bussing DD:  06/30/02 TD:  07/05/02 Job: [id number]

## 2011-04-30 NOTE — Op Note (Signed)
NAME:  Troy Warren, Troy Warren NO.:  [id number]   MEDICAL RECORD NO.:  [id number]          PATIENT TYPE:  OIB   LOCATION:  1419                         FACILITY[facility]   PHYSICIAN:  Jamison Neighbor, M.D.  DATE OF BIRTH:  04-10-1931   DATE OF PROCEDURE:  11/02/2005  DATE OF DISCHARGE:                                 OPERATIVE REPORT   PREOPERATIVE DIAGNOSIS:  Right cord liposarcoma.   POSTOPERATIVE DIAGNOSIS.:  Right cord liposarcoma.   PROCEDURE:  1.  Right inguinal exploration with resection of residual cord and pericord      soft tissue.  2.  Right hemi scrotectomy.   SURGEON:  Marcelyn Bruins, M.D.   ASSISTANT:  Glade Nurse, M.D.   ANESTHESIA:  General endotracheal.   SPECIMENS:  Right scrotal wall and residual cord structure.   DRAINS:  Right inguinal Jackson-Pratt.   PROCEDURE:  The patient was identified by his wrist bracelet and brought to  room 10 where he received preprocedure antibiotic and was prepped and draped  in usual sterile fashion, taking great care to minimize peripheral  neuropathy and compartment syndrome. Next, we made a 7-cm right inguinal  incision, dissected down through the dermis, subcutaneous fat, Scarpa's  fascia, down to the level of the external oblique fibers. We identified the  external ring and made a 1-cm incision proximal to and along the lines of  its fibers. We grasped each edge with a hemostat clamp, and using a Kidner,  bluntly dissected the nerve down away from the ilioinguinal nerve away from  the incision. Next, we further opened the external fascia along its fibers  down to level the ring. We then extended this incision with Metzenbaum  scissors along the length of its fibers in a cranial direction. Next, we  were able to grasp the cord structure, and with Kidner dissection, we were  able to bluntly dissect it and deliver it into the field on a Penrose drain.  Next, with a knife, we created a large ellipse around the  right hemiscrotum,  inscribing the entire right hemiscrotum. The dermis, subcutaneous tissue was  then divided with Bovie cautery. We were able to palpate a residual  mass/fibrosis the right hemiscrotum. We carried our dissection in a  circle  around this, maintaining a margin on all sides and effectively removing the  right hemiscrotum and all soft tissue to the level of the pubis on the  right. When this was entirely freed, we delivered this under our inguinal  bridge into the inguinal incision. Next, using Kelly clamps, we divided the  cord into three packets and double clamped each packet with Kelly clamps. We  then ligated each section of the cord with a 2-0 silk free tie and then AP 2-  0 silk suture ligature tie. The cord was then divided sharply and passed off  the field for pathologic analysis. We then thoroughly examined both incision  sites and addressed all bleeding points with Bovie cautery. Next, we turned  our attention to the inguinal incision. We closed the external fascia with a  running 2-0 Vicryl being careful  to protect the ilioinguinal nerve and keep  it in constant vision throughout the process. Next, we copiously irrigated  the wound and closed Scarpa's fascia with a running 2-0 Vicryl. Skin was  then closed with a running 4-0 subcuticular Monocryl. Dressing was applied.  We then turned attention to the scrotal wound. We closed this in three  layers, first redressing any venous oozing points with Bovie cautery. We  then closed the deep space with running 2-0 Vicryl. We then closed  the  level of the subdermal layer with a running 2-0 Vicryl and then closed skin  with a running 3-0 chromic. Dressings were applied. The patient was reversed  from his anesthesia which he tolerated without complication. Please note Dr.  Marcelyn Bruins was present for and participated in all aspects of this case.     ______________________________  Glade Nurse, MD      Jamison Neighbor, M.D.  Electronically Signed    MT/MEDQ  D:  11/02/2005  T:  11/03/2005  Job:  [id number]

## 2011-04-30 NOTE — Consult Note (Signed)
[place]  Patient:    Troy Warren, LANTRY Visit Number: [id number] MRN: [id number]          Service Type: CAT Location: 2000 2020 01 Attending Physician:  Loreli Dollar Dictated by:   Mikey Bussing, M.D. Proc. Date: 06/23/02 Admit Date:  06/22/2002   CC:         Julieanne Manson, M.D.  Marinus Maw, M.D.   Consultation Report  REQUESTING PHYSICIAN:  Julieanne Manson, M.D.  CONSULTANT:  Mikey Bussing, M.D.  PRIMARY CARE PHYSICIAN:  Marinus Maw, M.D.  REASON FOR CONSULTATION:  Severe two-vessel coronary artery disease with unstable angina.  CHIEF COMPLAINT:  Heartburn and chest pain.  HISTORY OF PRESENT ILLNESS:  I was asked to see this 77 year old white male type 2 diabetic in consultation by Dr. Clarene Duke for evaluation of potential surgical coronary revascularization for recently diagnosed severe two-vessel coronary artery disease.  The patient was initially referred by his primary care physician to Dr. Clarene Duke for evaluation of bradycardia.  A 2-D echo was performed which showed a dilated left ventricle with mild global hypokinesia, with ejection fraction of 45%.  There was a questionable aortic insufficiency of 1-2+.  The patients rhythm was sinus.  A repeat echo showed no aortic insufficiency, however.  During this period of evaluation and adjustment of his medicines to treat his bradycardia, he developed indigestion both at rest and during exercise.  To evaluate these new symptoms of potential ischemia in this patient with several risk factors (diabetes, hypertension, and peripheral vascular disease) a cardiac catheterization was performed by Dr. Clarene Duke yesterday.  This demonstrated two proximal high-grade stenoses of 80-90% in the LAD and a 90-95% stenosis of the ramus intermediate.  Ejection fraction was 45% and there was trace or a whiff of aortic insufficiency.  Based on the patients bad coronary anatomy  and recent symptoms of unstable angina, he was admitted to the hospital, placed on IV heparin, and a cardiothoracic surgical consultation was requested.  PAST MEDICAL HISTORY: 1. Type 2 diabetes. 2. Bradycardia. 3. Hypertension. 4. Surgical history positive for cholecystectomy and tonsillectomy.  ALLERGIES:  No known drug allergies.  CURRENT OUTPATIENT MEDICATIONS:  1. Altace 10 mg p.o. q.d.  2. Glyburide 5 mg p.o. t.i.d.  3. Cardura 8 mg p.o. q.d.  4. Norvasc 10 mg p.o. q.d.  5. Xanax 1 mg p.o. b.i.d.  6. Verapamil 240 mg p.o. q.d.  7. Actos 30 mg p.o. q.d.  CURRENT HOSPITAL MEDICATIONS:  1. IV heparin.  2. Aspirin 325 mg q.d.  3. Amlodipine or Norvasc 5 mg q.d.  4. NPH insulin  5. Humalog insulin sliding scale q.d.  6. Actos 30 mg q.d.  7. Maxzide one p.o. q.d.  8. Paxil 30 mg p.o. q.d.  9. Cardura 8 mg p.o. q.d. 10. Altace 10 mg p.o. q.d. 11. Glyburide 5 mg t.i.d. 12. Verapamil 240 mg a day. 13. Valium p.r.n.  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  He is married and stopped smoking 28 years ago.  He does not use alcohol.  FAMILY HISTORY:  Positive for diabetes and hypertension.  Both parents are deceased.  REVIEW OF SYSTEMS:  The patient denies any significant recent weight loss or other constitutional symptoms of fever or night sweats.  He denies any significant trauma to his chest or extremities.  HEENT:  He denies any ENT changes in vision or dysphagia.  He did have a tonsillectomy five years ago and did have some bleeding problems afterwards.  LUNG:  Negative for history of pneumonia or asthma.  His chest x-ray on admission does show an area of scar or nodular infiltrate in the lingula of the left lung.  CARDIAC: Positive for angina. Negative for orthopnea or PND.  Positive for dyspnea on exertion.  Negative for history of heart murmur or palpitation.  GI:  Negative for blood per rectum, jaundice, or  chronic abdominal pain.  GU:  Negative for hematuria, kidney stone, or significant nocturia.  VASCULAR:  Negative for DVT or claudication.  NEUROLOGIC:  Negative for seizure, syncope, concussion, or TIA.  HEMATOLOGIC:  Negative for previous blood transfusion or bleeding diathesis.  PSYCHIATRIC:  Negative for depression, insomnia, or diminished appetite.  PHYSICAL EXAMINATION:  VITAL SIGNS:  The patient is 5 feet 7 inches and weighs 195 pounds.  Blood pressure 140/70, heart rate 80 and sinus, oxygen saturation on room air is 95%.  His blood glucose this morning was 218.  GENERAL APPEARANCE:  A pleasant elderly male who is accompanied by his wife and family in the hospital room after having cardiac catheterization yesterday.  He is comfortable, in no distress.  HEENT:  Normocephalic.  He wears eyeglasses.  He has partial dental plates. The pharynx is clear.  NECK:  Supple without JVD, thyromegaly, mass, or bruit.  LYMPHATICS:  Reveal no palpable cervical, supraclavicular, or axillary adenopathy.  LUNGS:  Clear to auscultation.  CARDIAC:  Reveals regular rate and rhythm without S3 gallop.  I hear no murmur of aortic insufficiency.  ABDOMEN:  Mildly obese with a well-healed right upper quadrant surgical incision.  There is no abdominal bruit.  RECTAL:  Deferred.  VASCULAR:  Reveals 2+ pulses in the radial, femoral, and pedal areas.  There is no significant groin hematoma at the cardiac catheterization site.  EXTREMITIES:  Without edema, tenderness, or cyanosis.  Skin is warm, clear, and dry without rash or lesion.  There are no diabetic ulcers.  He has some mild varicosities of his tibial areas.   His ABI is greater than 1.0 in each lower extremity.  His carotid Doppler study showed no significant carotid stenosis.  NEUROLOGIC:  Alert and oriented x3 with full motor function.   LABORATORY DATA:  Creatinine normal.  Chest x-ray shows a nodular infiltrate or scar of the  lingular area which we followed with a CT scan.  Hematocrit 37; platelet count 145,000.  IMPRESSION AND RECOMMENDATION:  A 77 year old type 2 diabetic with a new-onset chest pain and severe two-vessel coronary artery disease with high-grade stenosis of the LAD diagonal, and ramus intermediate.  I agree that coronary bypass surgery would be his best therapeutic option for relief of angina, preservation of ventricular function, and improved long-term survival.  I reviewed the procedure in detail with the patient and his family including the choice of conduit for grafting, the location of the surgical incisions, the use of general anesthesia and cardiopulmonary bypass, and the expected postoperative hospital recovery.  I reviewed with the patient the alternatives to surgical therapy for treatment of his coronary artery disease.  I have discussed with the patient and family the potential complications to him of having heart surgery including those risks of MI, CVA, bleeding, infection, and death.  He understands these implications for the surgery and agrees to proceed with the operation as planned under what I feel is an informed consent.  Thank you very much for this consultation.  His surgery will be scheduled for the morning of Tuesday, June 26, 2002. Dictated by:   Mikey Bussing, M.D. Attending Physician:  Loreli Dollar DD:  06/23/02 TD:  06/23/02 Job: [id number]

## 2011-04-30 NOTE — Op Note (Signed)
NAME:  Troy Warren, Troy Warren NO.:  [id number]   MEDICAL RECORD NO.:  [id number]          PATIENT TYPE:  OIB   LOCATION:  2899                         FACILITY:  [facility]   PHYSICIAN:  Guadelupe Sabin, M.D.DATE OF BIRTH:  Oct 04, 1931   DATE OF PROCEDURE:  01/26/2005  DATE OF DISCHARGE:  01/26/2005                                 OPERATIVE REPORT   PREOPERATIVE DIAGNOSIS:  Nuclear cataract, right eye.   POSTOPERATIVE DIAGNOSIS:  Nuclear cataract, right eye.   DATE OF OPERATION:  Planned extracapsular cataract extraction -  phacoemulsification, primary insertion of posterior chamber intraocular lens  implant.   SURGEON:  Guadelupe Sabin, M.D.   ASSISTANT:  Nurse.   ANESTHESIA:  Local 4% Xylocaine, 0.75 Marcaine retrobulbar block with Wydase  added.  Topical tetracaine, intraocular Xylocaine.  Anesthesia standby  required in this diabetic patient.   OPERATIVE PROCEDURE:  After the patient was prepped and draped, a lid  speculum was inserted in the right eye.  The eye was turned downward, and a  superior rectus traction suture placed.  Schiotz tonometry was recorded at  10 scale units with a 5.5 gram weight.  A peritomy was performed adjacent to  the limbus from the 11 to 1 o'clock position.  The corneoscleral junction  was cleaned, and a corneoscleral groove made with a 45 degrees Superblade.  The anterior chamber was then entered with a 2.5 mm diamond keratome at the  12 o'clock position, and a 15 degrees blade at the 2:30 position.  Using a  bent 26 gauge needle on an Ocucoat syringe, a circular capsular rhexis was  begun and then completed with the Utrata forceps. Hydrodissection and  hydrodelineation were performed using 1% Xylocaine. The 30 degrees  phacoemulsification tip was then inserted with slow controlled  emulsification of the lens nucleus with back cracking and fragmentation with  the Bechert pick.  Total ultrasonic time 1 minute 7 seconds, average  power  level 14%, total amount of fluid used 75 mL.  Following removal of the  nucleus, the residual cortex was aspirated with the irrigation-aspiration  tip.  The posterior capsule appeared intact with a brilliant red fundus  reflex.  It was therefore elected to insert an Allergan medical optics SI 40  and B silicone three-piece posterior chamber intraocular lens implant,  diopter strength +20.00.  This was inserted with the McDonald forceps into  the anterior chamber and then centered into the capsular bag using the  [facility] lens rotator.  The lens appeared to be well-centered.  The Provisc  and Ocucoat which had used intermittently during the procedures were  replaced with balanced salt solution and Miochol ophthalmic solution. The  operative incisions appeared to be self-sealing.  It was, however, elected  to place a single 10-0 interrupted nylon suture across the 12 o'clock  incision to ensure closure and  to prevent endophthalmitis. Maxitrol ointment was instilled in the  conjunctival cul-de-sac and a light patch and protector shield applied.  Duration of procedure and anesthesia administration 45 minutes.  The patient  tolerated the procedure well in general, left the operating room  for the  recovery room in good condition.      HNJ/MEDQ  D:  04/11/2005  T:  04/11/2005  Job:  [id number]

## 2011-04-30 NOTE — Op Note (Signed)
   NAME:  Troy Warren, Troy Warren                       ACCOUNT NO.:  [id number]   MEDICAL RECORD NO.:  [id number]                   PATIENT TYPE:  AMB   LOCATION:  ENDO                                 FACILITY:  [facility]   PHYSICIAN:  James L. Malon Kindle., M.D.          DATE OF BIRTH:  01-25-1931   DATE OF PROCEDURE:  03/12/2003  DATE OF DISCHARGE:                                 OPERATIVE REPORT   PROCEDURE:  Colonoscopy with polypectomy and biopsy.   MEDICATIONS:  Fentanyl 50 mcg, Versed 4 mg IV.   ENDOSCOPE:  Olympus adult scope.   INDICATIONS:  Heme-positive stool.   DESCRIPTION OF PROCEDURE:  The procedure had been explained to the patient  and consent obtained.  With the patient in the left lateral decubitus  position, the adult Olympus scope was inserted and advanced under direct  visualization.  The prep was excellent.  We were able to reach the cecum,  the ileocecal valve and appendiceal orifice seen.  About 4-5 cm from the  appendiceal orifice was a 3-4 mm sessile polyp that was cauterized and  placed in jar #1.  The patient had been complaining of diarrhea, and the  mucosa throughout the entire colon was grossly normal.  Multiple biopsies  were obtained randomly throughout.  There was no significant diverticular  disease.  No polyps were seen until the rectum was reached, and a 0.75 cm  sessile polyp was encountered in the rectum and was removed with the snare  and sucked through the scope.  No other polyps were seen.  The scope was  withdrawn.  The patient tolerated the procedure well, was maintained on low-  flow oxygen and pulse oximeter throughout the procedure.   ASSESSMENT:  1. Polyps removed from the cecum and rectum.  2. Chronic diarrhea, grossly normal mucosa but microscopic biopsies     obtained.   PLAN:  Will check pathology on the polyps and the colonic mucosa, see back  in the office in two to three months and recheck his stools.                      James L. Malon Kindle., M.D.    Waldron Session  D:  03/12/2003  T:  03/12/2003  Job:  [id number]   cc:   Lucky Cowboy, M.D.  [address]  [place], [place] [address]  Fax: [phone]

## 2011-04-30 NOTE — H&P (Signed)
NAME:  Troy Warren, Troy Warren NO.:  [id number]   MEDICAL RECORD NO.:  [id number]          PATIENT TYPE:  INP   LOCATION:  2104                         FACILITY:  [facility]   PHYSICIAN:  Casimiro Needle L. Reynolds, M.D.DATE OF BIRTH:  June 17, 1972   DATE OF ADMISSION:  10/26/2006  DATE OF DISCHARGE:                                HISTORY & PHYSICAL   CHIEF COMPLAINT:  Speech disturbance.   HISTORY OF PRESENT ILLNESS:  This is one of several [place]  admissions for this 77 year old man with a past medical history which  includes coronary artery disease, atrial fibrillation, and anticoagulation.  The patient woke this morning with confused speech.  There is no associated  weakness or facial droop, although the family did notice that he was  somewhat unsteady on his feet.  He seemed to worsen a little bit over the  day.  He was seen by his primary physician and was advised to go to the  emergency room for evaluation for possible stroke.  In the emergency room,  he underwent a CT of the head which demonstrated a sizable left temporal  intracerebral hemorrhage.  His blood pressure was elevated in the emergency  department.  He has received intravenous labetalol.  There is no history of  any previous similar symptoms.   PAST MEDICAL HISTORY:  1. Coronary artery disease.  He had coronary artery bypass grafting in      2004 and an admission for angina with a negative catheterization in May      of 2007.  2. History of atrial fibrillation with chronic anticoagulation.  3. He has pacemaker secondary to bradycardia.  4. He has history of diabetes under uncertain control.  5. 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      Biopsies at that time were negative for prostate cancer   FAMILY HISTORY:  Notable for hypertension and diabetes.   SOCIAL HISTORY:  He lives at home with  his wife and is normally fairly  independent.  There is no history of tobacco or alcohol use.   ALLERGIES:  NO KNOWN DRUG ALLERGIES.   MEDICATIONS:  1. Xanax 1 mg each bedtime.  2. Cordarone 200 mg daily.  3. Coreg 25 mg two times daily.  4. Coumadin as directed.  5. Diovan 320 mg daily.  6. Lasix 40 mg two times daily.  7. NovoLog N 28 units in the morning and 12 units in the evening.  8. Zoloft 20 mg daily.  9. Potassium 10 mEq 4 times daily.  10.Levothyroxine 0.75 mg daily.  11.Baby aspirin.  12.Pravastatin 40 mg daily.  13.Sular 10 mg daily.  14.Isosorbide 30 mg daily.   REVIEW OF SYSTEMS:  Not available secondary to patient's alteration in  language function.  His family reports that he does have intermittent chest  pain even at this time.   PHYSICAL EXAMINATION:  VITAL SIGNS:  Temperature 98.1, blood pressure  211/97, pulse 71, respirations 16.  GENERAL:  This is an elderly, healthy-appearing man supine in the hospital  bed, no evidence of distress.  HEAD:  Cranium is normocephalic and atraumatic.  Oropharynx benign.  NECK:  Supple without carotid bruit.  HEART:  Occasional premature beats, otherwise regular rhythm with no murmur.  CHEST:  Clear to auscultation bilaterally.  ABDOMEN:  Soft, normoactive bowel sounds.  His liver is at the costal  margin.  EXTREMITIES:  2+ pulses, 1+ edema.  NEUROLOGIC:  Mental status:  He is awake and alert.  He has fluent aphasia  and is only intermittently able to follow simple commands.  For the most  part, he does not understand spoken speech.  He is unable to answer  orientation questions.  Cranial nerves:  Pupils are 3 mm, brisk, reactive.  Extraocular movements are full without nystagmus.  He does not blink to  threat from either side.  The face is symmetric, and he is able to protrude  his tongue symmetrically.  Motor:  Normal bulk and tone.  He has normal  strength in all tested extremity muscles.  Sensation intact to pain in  all  extremities.  Reflexes are 2+ and symmetric. Toes are downgoing bilaterally.  Cerebellar and gait are deferred.   LABORATORY DATA:  Laboratory reviewed.  CBC:  White count 6.9, hemoglobin  13.8, platelets 188,000.  BMET remarkable for an elevated glucose of 135.  BUN and creatinine are 16 and 1.2 respectively.  Prothrombin time is 35.1  seconds with an INR of 3.2.  PTT is elevated at 76 seconds. LFTs are normal  except for slightly elevated bilirubin.  Cardiac enzymes are negative.   CT of the head is personally reviewed.  The study demonstrates a sizable  left temporal hemorrhage with approximately 5 mm of left-to-right shift.   IMPRESSION:  1. Acute intracerebral hemorrhage secondary to anticoagulation with      resultant fluent aphasia.  2. Hypertensive emergency.  3. History of coronary artery disease.  4. History of atrial fibrillation.  5. History of congestive heart failure.  6. History of diabetes.  7. History of prostate surgery.   PLAN:  We will admit to the ICU.  He needs blood pressure control,  intravenous Cardene, plus we will correct his coagulopathy as quickly as  possible with FFP.  Given his complex cardiac history, factor VII is felt to  be contraindicated.  Stroke service to follow.      Michael L. Thad Ranger, M.D.  Electronically Signed     MLR/MEDQ  D:  10/26/2006  T:  10/27/2006  Job:  [id number]   cc:   Lucky Cowboy, M.D.  Thereasa Solo. Little, M.D.

## 2011-04-30 NOTE — Discharge Summary (Signed)
NAME:  Troy Warren, Troy Warren             ACCOUNT NO.:  [id number]   MEDICAL RECORD NO.:  [id number]          PATIENT TYPE:  INP   LOCATION:  4711                         FACILITY:  [facility]   PHYSICIAN:  Thereasa Solo. Little, M.D. DATE OF BIRTH:  28-Aug-1931   DATE OF ADMISSION:  11/29/2004  DATE OF DISCHARGE:  12/01/2004                                 DISCHARGE SUMMARY   ADMISSION DIAGNOSES:  1.  Progressive dyspnea on exertion.  2.  History of coronary artery disease.      1.  History of coronary artery bypass grafting July 2003.  3.  History of ejection fraction 55% at catheterization July 2003.  4.  History of atrial fibrillation, the patient is on chronic Coumadin and      amiodarone.  5.  History of permanent pacemaker.  6.  Diabetes mellitus type 2.  7.  Hypotension.  8.  History of remote pericardial effusion secondary to minoxidil.  9.  History of cholecystectomy.  10. History of tonsillectomy.  11. History of laser therapy to both eyes over the last six months.   DISCHARGE DIAGNOSES:  1.  Progressive dyspnea on exertion.  Probably secondary to dietary      indiscretion.  Cardiac enzymes negative for myocardial infarction.      Pacemaker interrogated and functioning well.  A 2-D echocardiogram      performed and shows no significant change.  2.  History of coronary artery disease.      1.  History of coronary artery bypass grafting July 2003.  3.  History of ejection fraction 55% at catheterization July 2003.  4.  History of atrial fibrillation, the patient is on chronic Coumadin and      amiodarone.  5.  History of permanent pacemaker.  6.  Diabetes mellitus type 2.  7.  Hypotension.  8.  History of remote pericardial effusion secondary to minoxidil.  9.  History of cholecystectomy.  10. History of tonsillectomy.  11. History of laser therapy to both eyes over the last six months.   HISTORY OF PRESENT ILLNESS:  Troy Warren is a 77 year old white male with  history of CAD  status post CABG July 2003.  He also has a history of  hypertension, diabetes mellitus.  He has a history of atrial fibrillation  and permanent pacemaker implantation.  He is now on amiodarone and Coumadin  anticoagulation.   He presented to the ER on November 29, 2004.  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  He says that six months ago he would not have felt short of breath  with this type of activity.  Also he had noticed that when picking greens  in the garden he also felt short of breath.  Whenever he does any minor  activity he feels dyspneic.   He then states that on the prior evening and  on the morning of admission, he  got up and went to the bathroom.  When he laid back in the bed, he felt  significantly short of breath.  Shortness of breath was so significant that  he could not go back to sleep.  Instead he had to get up, take a nerve pill.  He was not improving, he called his daughter who brought him the ER.  He had  no significant cough, had some lower extremity swelling, but this was  chronic and no worse.   As well, he stated that he had recently had a little bit of an indigestion  feeling.  He says that prior to bypass he had a little bit of a similar  feeling.  He says that recently he has been taking Tums with relief and has  not used any nitroglycerin.  The discomfort resolved quickly in just a  matter of minutes.   At this point he was seen and evaluated by Dr. Charolette Child who was on call  for Dr. Clarene Duke.  He planned for gentle diuresis, monitor overnight.  We  would check enzymes to rule out MI.   HOSPITAL COURSE:  On November 30, 2004, Mr. Dufresne was seen by Dr. Clarene Duke.  He was found to have BNP of only 137.  However, Dr. Clarene Duke did feel  that he  had rales halfway up bilaterally.  He felt that the patient had mild CHF.  He planned to give some IV Lasix and would check an echocardiogram.  At that  point cardiac enzymes were negative.   A 2-D echocardiogram was done November 30, 2004.  I am unable to locate a  full report on the chart.  However, this was reviewed by Dr. Alanda Amass prior  to the patient's discharge.  I do know that this did not show any  significant abnormality.   The morning of December 01, 2004 Dr. Alanda Amass performed pacemaker  interrogation.  He felt that the pacemaker was functioning okay.  He had  reviewed the echocardiogram as well.  He also reviewed it with the patient.  At this point the heart was in the 60s pacing, oxygen saturations were  greater than 96% on room air.  Lungs sounded fairly clear.  There is no  lower extremity edema.   At this time again he had reviewed the above as well as the echo and the  pacemaker interrogation and felt that the patient was deemed stable for  discharge home.   HOSPITAL CONSULTS:  None.   HOSPITAL PROCEDURES:  A 2-D echocardiogram done November 30, 2004.  Cannot  find the typed report copy on the chart.   EKG shows appropriate pacing.   LABORATORY REVIEW:  TSH normal at 2.914.  Cardiac enzymes negative, with CK  of 73, 68, and 59, MB 3.1, 2.4, 1.6, troponin 0.04, 0.03, 0.03.  Liver  function tests are normal.  Sodium 141, potassium 3.6, BUN 12, creatinine  0.9.  White count 5.4, hemoglobin 12.1, hematocrit 32.1, platelets 157.  INR  therapeutic at 2.9 and 3.3.   DISCHARGE MEDICATIONS:  1.  Novolin N 28 units in the morning, 12 units at night.  2.  Diovan 320 mg at night.  3.  Paroxetine HCl 30 mg 1/2 tablet at night.  4.  Norvasc 10 mg at night.  5.  Amiodarone 200 mg 1/2 tablet at night.  6.  Coumadin 5 mg every day except for 2.5 mg on Fridays. 7.  Toprol-XL 50 mg tablet  1/2 tablet daily.  8.  Alprazolam as before.  9.  Fish oil, flaxseed as  before.  10. Lasix 60 (1-1/2 of the 40 mg tablet) once a day.  11. K-Dur 10 mEq a day on Monday, Wednesday and Friday only.   DISCHARGE INSTRUCTIONS:  1.  Low sodium diabetic diet.  2.  Have your blood drawn to check a PT/INR in three weeks.  3.  Have your blood drawn to check a BMET in1-2 weeks.  4.  Follow up with Dr. Clarene Duke January 05, 2004 at 10:45 a.m.      MBE/MEDQ  D:  01/18/2005  T:  01/18/2005  Job:  [id number]   cc:   Lucky Cowboy, M.D.  [address]  [place], [place] [address]  Fax: [phone]

## 2011-04-30 NOTE — Op Note (Signed)
   NAME:  Troy Warren, Troy Warren NO.:  [id number]   MEDICAL RECORD NO.:  [id number]                   PATIENT TYPE:  INP   LOCATION:  2008                                 FACILITY:  [facility]   PHYSICIAN:  Jamison Neighbor, M.D.               DATE OF BIRTH:  1931/08/05   DATE OF PROCEDURE:  06/08/2003  DATE OF DISCHARGE:  05/27/2003                                 OPERATIVE REPORT   PREOPERATIVE DIAGNOSIS:  Penile bleeding secondary to Foley catheter trauma.   POSTOPERATIVE DIAGNOSIS:  Penile bleeding secondary to Foley catheter  trauma.   PROCEDURE:  Flexible cystoscopy.   SURGEON:  Jamison Neighbor, M.D.   ANESTHESIA:  Local.   COMPLICATIONS:  None.   DRAINS:  None.   BRIEF HISTORY:  This is a patient evaluated by me in the past.  He is known  to have some prostatic obstruction.  The patient had been in the hospital  for evaluation of chest pain as well as new onset of atrial fibrillation.  The patient had a Foley catheter inserted, and it does appear it was  somewhat traumatic.  He has had bleeding since that time and now has  bleeding coming from the urethra.  He is to undergo diagnostic evaluation.   DESCRIPTION OF PROCEDURE:  Flexible cystoscopy was performed at the bedside.  Using local anesthesia, the patient was sterilely prepped and draped.  The  flexible cystoscope was inserted.  The urethra was visualized in its  entirety.  In the deep bulb just prior to the prostate, there was evidence  of urethral trauma and some bleeding from that area.  That area was easily  bypassed.  The bladder was carefully inspected.  It was free of any tumors  or stones.  Both ureteral orifices were normal and appeared to be in  original location.  The patient did have an enlarged prostate.   Foley catheter was inserted.  With this in place, the bleeding should slow  down and stop.  This can be discontinued in the next few days.  The patient  will be followed by me  during his hospital stay as well post discharge.                                               Jamison Neighbor, M.D.    RJE/MEDQ  D:  06/09/2003  T:  06/09/2003  Job:  [id number]

## 2011-04-30 NOTE — Cardiovascular Report (Signed)
NAME:  Troy Warren, Troy Warren NO.:  [id number]   MEDICAL RECORD NO.:  [id number]          PATIENT TYPE:  INP   LOCATION:  2001                         FACILITY:  [facility]   PHYSICIAN:  Darlin Priestly, MD  DATE OF BIRTH:  02-26-1931   DATE OF PROCEDURE:  04/22/2006  DATE OF DISCHARGE:  04/23/2006                              CARDIAC CATHETERIZATION   PROCEDURE:  1.  Left heart catheterization.  2.  Coronary angiography.  3.  Left ventriculogram.  4.  Saphenous vein graft angiography.  5.  Left internal mammary artery angiography.   CARDIOLOGIST:  Darlin Priestly, M.D.   COMPLICATIONS:  None.   INDICATIONS FOR PROCEDURE:  Mr. Wenig is a 77 year old male, a patient of  Dr. Gaspar Garbe B. Little's and Dr. Lacretia Leigh, with a history of coronary  artery disease, status post coronary artery bypass graft surgery in July  2003, consisting of a vein graft to the ramus intermedius and a LIMA to the  diagonal, a history of ischemic cardiomyopathy with an ejection fraction of  approximately 40%, a history of symptomatic  bradycardia, status post dual-  chamber pacer implant in 2004, hypertension and diabetes, who was admitted  on Apr 21, 2006, with substernal chest tightness and shortness of breath.  He is now brought for a repeat cardiac catheterization to reassess his  grafts.   DESCRIPTION OF PROCEDURE:  After giving an informed consent, the patient was  brought to the cardiac catheterization laboratory.  The right and left  groins were shaved, prepped and draped in the usual sterile fashion.  An  electrocardiogram monitor was established.  Using the modified Seldinger  technique a 6-French arterial sheath was inserted in the right femoral  artery.  The 6-French diagnostic catheters used to perform a diagnostic  angiography.   RESULTS:  1.  LEFT MAIN CORONARY ARTERY:  The left main coronary artery is a large and      short vessel, with no significant disease.  2.   LEFT ANTERIOR DESCENDING CORONARY ARTERY:  The left anterior descending      coronary artery is large vessel which courses back to the apex and      supplies two diagonal branches.  The LAD is noted to have a 60% proximal      and an 80% mid-lesion prior to the takeoff of the second diagonal.  The      remainder of the LAD does not have any significant disease.  The first      diagonal is a small to medium-sized vessel which bifurcates distally      with 70% ostial lesion.  The second diagonal is a medium-sized vessel      which is noted to be grafted via the LIMA.  There is a mild 50% kinking      segment beyond the IMA insertion in the diagonal.  3.  LEFT CIRCUMFLEX CORONARY ARTERY:  The left circumflex coronary artery is      a large vessel which courses in the AV groove and supplies four obtuse      marginal branches.  The AV  groove circumflex has no significant disease.      The first OM is a medium-sized vessel which fills competitively and is      noted to have a 90% proximal stenosis.  There is a patent saphenous vein      graft which inserts in the mid-portion of the first OM.  The vein graft      does appear to have approximately a 50% lesion at its ostium; however,      there does not appear to be any further significant disease within the      graft or distal to the graft insertion.  The second, third and fourth      OM's are all large vessels which bifurcate in their mid-segments with no      significant disease.  4.  RIGHT CORONARY ARTERY:  The right coronary artery is a large vessel      which is dominant and __________ the PDS posterolateral branch.  There      is a mild 30% mid-RCA narrowing but no high-grade stenosis.  The PD and      posterolateral branch have no significant disease.   A hand injection of the LV reveals an ejection fraction of approximately 40%  with global hypokinesis.   HEMODYNAMICS:  Systemic arterial pressure:  183/72.  LV systemic pressure:   182/90.  LVEDP:  14.   CONCLUSION:  1.  Significant two-vessel coronary artery disease.  2.  Patent left internal mammary artery to the diagonal with a 50% kinking      segment beyond the left internal mammary artery insertion.  3.  Patent saphenous vein graft to the first obtuse marginal, with a 50%      ostial lesion in the saphenous vein graft, but no significant disease in      the remainder of the graft or distal to the graft insertion.  4.  Mildly to moderately depressed left ventricular systolic function.  5.  Systemic hypertension.      Darlin Priestly, MD  Electronically Signed     RHM/MEDQ  D:  04/22/2006  T:  04/24/2006  Job:  [id number]   cc:   Thereasa Solo. Little, M.D.  Fax: [phone]   Lucky Cowboy, M.D.  Fax: [phone]

## 2011-04-30 NOTE — Op Note (Signed)
NAME:  Troy Warren, Troy Warren                       ACCOUNT NO.:  [id number]   MEDICAL RECORD NO.:  [id number]                   PATIENT TYPE:  OIB   LOCATION:  5740                                 FACILITY:  [facility]   PHYSICIAN:  Guadelupe Sabin, M.D.             DATE OF BIRTH:  [birth year]   DATE OF PROCEDURE:  09/18/2002  DATE OF DISCHARGE:  09/19/2002                                 OPERATIVE REPORT   PREOPERATIVE DIAGNOSES:  1. Retained cataract fragments following recent cataract surgery left eye.  2. Proliferative diabetic retinopathy left eye.   POSTOPERATIVE DIAGNOSES:  1. Retained cataract fragments following recent cataract surgery left eye.  2. Proliferative diabetic retinopathy left eye.   OPERATION/PROCEDURE:  Posterior vitrectomy through pars plana using vitreous  infusion suction cutter, endolaser photocoagulation, removal of retained  cataract lens fragments.   SURGEON:  Guadelupe Sabin, M.D.   ASSISTANT:  Nurse.   ANESTHESIA:  General.   OPHTHALMOSCOPY:  As previously described.   DESCRIPTION OF PROCEDURE:  After the patient was prepped and draped, a lid  speculum was inserted in the left eye. A peritomy was performed adjacent to  the limbus from the 7:00 to 12:00 position and from the 9:00 to 10:00  position. The subconjunctival tissue was cleaned. The cataract incision at  the 12:00 position appeared sealed and secure. Three sclerotomy sites were  prepared using the MVR blade #19 gauge knife. The 6 mm vitreous infusion  terminal was secured in place at the 3:30 position with a 5-0 white mattress  Dacron suture. The tip could be seen projecting into the vitreous cavity.  The fiberoptic light pipe was inserted at the 2:00 position and the hand  piece of the vitreous infusion suction cutter at the 10:00 position. Slow  vitreous infusion suction cutting was begun anteriorly initially. The  retained fragments were aspirated in the pupillary aperture and the  posterior capsule was opened slightly to remove some of the cortical  fragment in the pupillary space at the posterior capsule. Attention was then  paid to the deeper vitreous and retina. There were no retained fragments  seen in the mid vitreous or on the retinal surface. The old diabetic  proliferative retinopathy and panretinal laser photocoagulation was noted.  There was one area of retinal hemorrhage surrounded by hard, yellow exudates  and it was elected to perform endolaser photocoagulation to this area and to  other selective microaneurysms or dot hemorrhages seen at the posterior  pole. A total of 128 applications were applied. The macular area was  inspected and felt to represent chronic macular edema and thickening with a  grayish glistening appearance. This did not appear to be a definite  preretinal membrane, but just thickening of the retinal itself. It was  therefore elected not to perform any preretinal membrane peeling or an  excision. The vitreous instruments were withdrawn and the fundus inspected  with indirect ophthalmoscopy and  scleral depression. No peripheral retinal  tear or detachment areas were noted. The sclerotomy sites were closed with 7-  0 Vicryl sutures and the conjunctiva closed with a running 7-0 Vicryl  suture. Depo, Garamycin and dexamethasone were injected in the subtenon's  space inferiorly and Maxitrol and Atropine ointment were instilled in  the conjunctival cul-de-sac. A light patch and protector shield were  applied. Duration of the procedure and anesthesia administration one hour.  The patient tolerated the procedure well in general and left the operating  room for the recovery room in good condition.                                               Guadelupe Sabin, M.D.    HNJ/MEDQ  D:  09/18/2002  T:  09/20/2002  Job:  [id number]   cc:   Thereasa Solo. Little, M.D.

## 2011-04-30 NOTE — Op Note (Signed)
NAME:  Troy Warren, Troy Warren NO.:  [id number]   MEDICAL RECORD NO.:  [id number]          PATIENT TYPE:  AMB   LOCATION:  DFTL                         FACILITY:  [facility]   PHYSICIAN:  Jamison Neighbor, M.D.  DATE OF BIRTH:  Jan 04, 1931   DATE OF PROCEDURE:  05/24/2006  DATE OF DISCHARGE:  05/24/2006                                 OPERATIVE REPORT   PREOPERATIVE DIAGNOSIS:  History of urine leak, urinoma, possible wound  infection.   POSTOPERATIVE DIAGNOSIS:  Resolved urine leak.   PROCEDURE:  Cystoscopy and cystogram.   SURGEON:  Jamison Neighbor, M.D.   ASSISTANT:  Terie Purser, MD.   ANESTHESIA:  General anesthesia.   COMPLICATIONS:  None.   DRAINS:  None.   BRIEF HISTORY:  This 77 year old male had a suprapubic prostatectomy  performed and was doing quite well but then he developed what appeared to be  a wound infection.  I opened that up and found a significant urinoma even  though the patient had been urinating normally.  A follow-up cystogram  seemed to show persistent leaks with the patient now to undergo cystoscopy,  cystogram and possible exploration to fix this area along with drainage of  any infection that may be present.  He understands the risks and benefits of  the procedure and gave full informed consent.   DESCRIPTION OF PROCEDURE:  After successful induction of general anesthesia,  the patient was placed in the dorsal lithotomy position, prepped with  Betadine and draped in the usual sterile fashion.  Cystoscopy was performed  and urethra was visualized in its entirety and found to be normal.  Beyond  the verumontanum was a wide open prostatic fossa following the recent open  prostatectomy.  The bladder was inspected and no irregularities could be  seen. A cystogram was performed and there were no signs of a leak.  The  patient apparently had healed over this area and was most likely simply  leaking from a suprapubic tube site. The bladder was  drained and catheter  was left out.  The patient tolerated the procedure well and was taken to the  recovery room in good condition.           ______________________________  Jamison Neighbor, M.D.  Electronically Signed     RJE/MEDQ  D:  06/08/2006  T:  06/08/2006  Job:  [id number]

## 2011-04-30 NOTE — Consult Note (Signed)
NAME:  Troy Warren, Troy Warren NO.:  [id number]   MEDICAL RECORD NO.:  [id number]          PATIENT TYPE:  INP   LOCATION:  2104                         FACILITY:  [facility]   PHYSICIAN:  Hewitt Shorts, M.D.DATE OF BIRTH:  August 02, 1931   DATE OF CONSULTATION:  10/27/2006  DATE OF DISCHARGE:                                   CONSULTATION   HISTORY OF PRESENT ILLNESS:  The patient is a 77 year old white male who was  admitted yesterday from the emergency room to the stroke service after  having suffered a stroke yesterday.   The patient was having difficulties yesterday with difficulty breathing,  difficulties with speech and headache and was taken to see his family  physician, Dr. Cheri Rous, who sent him to the emergency room and the  patient was evaluated by Dr. Thad Ranger and admitted to the stroke service.  He was seen in follow up today by Dr. Delia Heady from the stroke service.   His history is notable for numerous medical problems, but most significantly  related to this is that he is on Coumadin for atrial fibrillation.   CT, yesterday, showed a large left temporal intracerebral hematoma.  The  patient was given vitamin K and fresh frozen plasma for reversible  therapeutic anticoagulation.  However, followup CT scan, this morning, was  reviewed by Dr. Pearlean Brownie, who felt that the hematoma appears slightly bigger  and he requested neurosurgical consultation.  He also felt that, because of  INR was somewhat prolonged, he was going to give him additional fresh frozen  plasma.   PAST MEDICAL HISTORY:  Notable for a history of hypertension for 35 years, a  history of type 1 diabetes for 15 years, a history of sleep apnea that has  not been treated, a history of prostate cancer diagnosed in 2006, a history  of glaucoma, and a history of atherosclerotic cardiovascular disease status  post a CABG in 2004 with subsequent atrial fibrillation.   PAST SURGICAL HISTORY:   Cholecystectomy 25 years ago, tonsillectomy 20 years  ago, CABG by Dr. Donata Clay in 2004 with subsequent placement of a pacemaker  in 2005.  His cardiologist is Dr. Clarene Duke.   ALLERGIES:  No allergies to medications.   MEDICATIONS ON ADMISSION:  1. Isosorbide 30 mg daily.  2. Xanax 1 mg q.h.s.  3. Diovan 320 mg q.h.s.  4. Sertraline 100 mg q.h.s.  5. Lasix 40 mg q.12 hours.  6. Klor-Con 10 mEq daily.  7. Levothyroxine 75 mcg daily.  8. Pravastatin 40 mg daily.  9. Coumadin 4 mg on Saturday, Sunday, Monday, Wednesday and Thursday, and      2.5 mg on Tuesday and Friday.  10.Amiodarone 100 mg daily.  11.Coreg 12.5 mg q.h.s.  12.Novolin 20 units q.a.m. and 12 units q.p.m.  13.Vitamin.  14.Eye drops.  15.Fish oil 1000 mg daily.  16.Flaxseed oil 1000 mg daily.  17.Aspirin 81 mg daily.   FAMILY HISTORY:  His parents have passed on, his mother of old age and  declining health, his father of a myocardial infarction.  He has 6 sisters  that are  living, 1 brother who passed on from a drowning.  He has no  biological children, but 2 stepdaughters.   SOCIAL HISTORY:  The patient is retired as a Chartered certified accountant.  He quit smoking 40  years ago.  He does not drink alcohol and does not have a history of drug  abuse.  He is married.   REVIEW OF SYSTEMS:  Notable for those as described in the history of present  illness and past medical history, but because of a depressed level of  responsiveness, further review of systems is not feasible.   PHYSICAL EXAMINATION:  GENERAL:  The patient is a well-developed, well-  nourished white male, drowsy but arousable and in no acute distress.  VITAL SIGNS:  Temperature 98.4, pulse 70, blood pressure 135/48, respiratory  rate 20.  Height 5 feet, 8 inches.  LUNGS:  Clear to auscultation.  He has symmetrical expiratory excursion.  HEART:  Regular rate and rhythm.  S1, S2.  There is no murmur.  MENTAL STATUS:  The patient is drowsy, but arouses.  He follows some  simple  commands, but other simple commands he is unable to follow.  Such as, he is  able to push down with his feet on command, but is unable to hold up 2  fingers to command.  His speech is garbled.  He has word-finding  difficulties.  He is unable to clearly answer questions of orientation.  Cranial nerves shows that the left pupil is 3.5 mm, the right is 2.5 mm.  They are round, but sluggishly reactive to light.  He has a right ptosis.  He has a right facial paresis.  MOTOR:  A right hemiparesis with the arm being weaker than the leg.  Sensation is more intact to pinprick on the left than on the right side.  Reflexes are absent throughout the upper and lower extremity including the  biceps, brachialis, triceps, quadriceps, and gastrocnemius.  Symmetrical  bilaterally.  Toes downgoing bilaterally.  Gait exam is un-testable at this  time due to his medical condition.   IMPRESSION:  Large left temporal intracerebral hematoma with global  (incomplete) aphasia and right hemiparesis.  Mental status shows a decreased  level of responsiveness.   RECOMMENDATIONS:  Discussed my assessment and impression patient, his wife,  and 2 stepdaughters.  They explained that he is slightly less responsive  than yesterday when they came into the emergency room.  We discussed the  nature of his condition and the possibility that he may continue to decline  as edema and swelling, as a reaction to the stroke, develops.  We will need  to monitor his responsiveness and function.   It was explained to him that he may well require evacuation of the hematoma  if his mental status continues to decline and his responsiveness continues  to decline, or if his left pupil enlarges further.   Recommend repeat CT of the brain in the morning.  I agree with Dr. Marlis Edelson  order for further fresh frozen plasma as well as giving additional vitamin K and will continue to follow the patient along with the neurology  service.      Hewitt Shorts, M.D.  Electronically Signed     RWN/MEDQ  D:  10/27/2006  T:  10/28/2006  Job:  [id number]   cc:   Pramod P. Pearlean Brownie, MD

## 2011-04-30 NOTE — Cardiovascular Report (Signed)
   NAME:  Troy Warren, Troy Warren                       ACCOUNT NO.:  [id number]   MEDICAL RECORD NO.:  [id number]                   PATIENT TYPE:  OIB   LOCATION:  2860                                 FACILITY:  [facility]   PHYSICIAN:  Thereasa Solo. Little, M.D.              DATE OF BIRTH:  01-15-1931   DATE OF PROCEDURE:  07/19/2003  DATE OF DISCHARGE:                              CARDIAC CATHETERIZATION   HISTORY:  [name] is a 77 year old male who has had asymptomatic  bradycardia in the past, but developed atrial fibrillation with increased  ventricular response.  He was placed on Coumadin, Cardizem, and metoprolol  and has had a therapeutic INR for several months.  He is brought in for  elective outpatient cardioversion.   PROCEDURE:  After receiving 225 mg of IV Pentothal by Dr. Jean Rosenthal from  anesthesia, the patient reached an appropriate level of sedation.  He  underwent elective cardioversion.   Two hundred watt seconds resulted in changing the atrial fibrillation with a  rate of 75 to sinus bradycardia with a rate of 38.  Even with this lower  pulse his blood pressure was stable at 155/38.  Now that the patient is  awake, his pulse is 50, blood pressure still in the 150s.   Because of his bradycardia, I have stopped his metoprolol, but will continue  him on his Cardizem.  He has an appointment to see me in my office in two  weeks but because of his bradycardia I have asked my colleagues at  [facility] to see him on August 11 at 2:00 p.m.                                               Thereasa Solo. Little, M.D.    ABL/MEDQ  D:  07/19/2003  T:  07/19/2003  Job:  [id number]

## 2011-04-30 NOTE — H&P (Signed)
NAME:  Troy Warren, Troy Warren                       ACCOUNT NO.:  [id number]   MEDICAL RECORD NO.:  [id number]                   PATIENT TYPE:  AMB   LOCATION:  2008                                 FACILITY:  [facility]   PHYSICIAN:  Thereasa Solo. Little, M.D.              DATE OF BIRTH:  [birth year]   DATE OF ADMISSION:  05/20/2003  DATE OF DISCHARGE:                                HISTORY & PHYSICAL   ADMISSION DIAGNOSES:  New onset of atrial fibrillation with rapid  ventricular response and chest pain.   HISTORY OF PRESENT ILLNESS:  The patient is a 77 year old male who underwent  bypass surgery in July 2003, which included an internal mammary artery graft  to the LAD and a saphenous vein graft to the ramus intermediate.  At that  time he had a 45% ejection fraction and did reasonably well following  surgery.  He also had mild aortic insufficiency at the time of his cardiac  catheterization.  About one week ago, while working in the yard, he had an episode of  exertional chest pressure that lasted for about five to seven minutes.  It  did not radiate into the back.  Some awareness in his arm, resolved, and he  has had no recurrence.  Today he had two episodes of lightheadedness where  he felt faint.  He did not have chest pain with this, and was unaware of any  arrhythmias or palpitations.  In my office today on a routine examination, he has new onset of atrial  fibrillation with a rapid ventricular response at 110-135.  In the distant  past he has had asymptomatic bradycardia.   PAST MEDICAL HISTORY:  1. Hypertension, which was in the distant past somewhat difficult to     control, but has been controlled nicely recently.  2. Pericardial effusion, secondary to Minoxidil.  This resolved after     stopping the Minoxidil.  3. Diabetes mellitus.  4. Aortic insufficiency.   FAMILY HISTORY:  Positive for hypertension in a sister and diabetes mellitus  in his mother.   SOCIAL HISTORY:   Married, a 8th grade education.  No cigarettes since 1973.  Occasional alcohol.  Appropriate diet.  Very active.   REVIEW OF SYSTEMS:  No dizziness or lightheadedness.  No real complaints of  tiredness or fatigue, although he does not fell his stamina is as good as it  should be.  This far out from his surgery.  No PND or orthopnea.  No  puffiness, no claudication complaints.  He has been on Paxil for mild  depression from Dr. Lucky Cowboy.  Blood sugar has been adequately  controlled.  No neuropathy-type complaints.   CURRENT MEDICATIONS:  1. Glyburide 5 mg b.i.d.  2. Aspirin one daily.  3. Diovan 320 mg one daily.  4. Hydrochlorothiazide 25 mg daily.  5. Verapamil 240 mg one daily.  6. Paxil 30 mg, 1/2 tablet daily.  7. Ativan 0.5 mg p.r.n.   PHYSICAL EXAMINATION:  VITAL SIGNS:  Blood pressure 130/80, heart rate 128  and irregular, respirations 16, weight 185 pounds.  HEENT:  Unremarkable.  Posterior pharynx normal.  Minor nicking and  narrowing of the funduscopic vessels.  LUNGS:  Clear.  HEART:  Reveals an irregular rhythm with a rapid rate.  No systolic murmur  can be appreciated.  I did not appreciate a diastolic murmur.  NECK:  No carotid bruits.  Thyroid is nonpalpable.  The neck is supple.  ABDOMEN:  No bruits.  Soft, nontender, no masses.  EXTREMITIES:  Good pulses in the upper and lower extremities.  No edema.  SKIN:  Warm and dry.  NEUROLOGIC:  Grossly intact.   ASSESSMENT:  1. New onset of rapid atrial fibrillation.  2. Chest pain.  3. Hypertension.  4. Diabetes mellitus.  5. Known coronary artery disease, status post bypass surgery in 2003.   PLAN:  I will admit the patient to the hospital and start him on IV heparin  and IV Cardizem and stop his Verapamil.  Will start him with amiodarone p.o.  because of relatively low ejection fraction, and plan a cardiac  catheterization in the morning.  If this does not show any significant  coronary artery disease,  will go ahead and start Coumadin loading.  In our office on May 20, 2003, a 2-D echocardiogram was performed which  showed a 30%-35% ejection fraction, with the anterior wall being  hypokinetic.  The left ventricle was dilated at 6.5 cm in systole, 5.5 in  diastole.  The left atrium dimension was dilated at 5.2 cm.  Doppler study  was less than adequate because of the rapid heart rate.                                                Thereasa Solo. Little, M.D.    ABL/MEDQ  D:  05/20/2003  T:  05/20/2003  Job:  [id number]   cc:   Lucky Cowboy, M.D.  [address], [address]  [place], [place] [address]  Fax: [phone]

## 2011-04-30 NOTE — H&P (Signed)
[facility]  Patient:    Troy Warren, Troy Warren Visit Number: [id number] MRN: [id number]          Service Type: CAT Location: 2000 2020 01 Attending Physician:  Loreli Dollar Dictated by:   Julieanne Manson, M.D. Admit Date:  06/22/2002                           History and Physical  ADMITTING DIAGNOSIS:  Severe coronary disease with angina.  HISTORY OF PRESENT ILLNESS:  Troy Warren is a 77 year old male, who had an episode of indigestion about 10 days ago that occurred for no apparent reason. It lasted about five minutes, was more intense than he had ever had before, and occurred at rest.  He denies being short of breath or diaphoretic with this, but his wife said he looked very clammy and pale.  He had a similar episode the following day, this time while mowing grass.  It was not as severe, and he has had none since that time.  Because of this, plus he has known dilatation of the left ventricle and mild AI, he is brought in for outpatient cardiac catheterization on todays date.  His cardiac catheterization showed severe disease in his LAD, diagonal, and ramus intermediate and the ostium of a circumflex and because of this, he is being admitted to the hospital.  PAST MEDICAL HISTORY: 1. Positive for hypertension which in the distant past was very severe to    control, and he had been on minoxidil.  He had a pericardial effusion    secondary to minoxidil; this resolved with discontinuation of minoxidil. 2. Adult onset diabetes. 3. Aortic insufficiency by echocardiogram. 4. LV dilatation by echocardiogram. 5. Bradycardia, medication induced. 6. Intermittent diarrhea.  FAMILY HISTORY:  Positive for hypertension in a sister, diabetes in his mother.  SOCIAL HISTORY:  Married, eighth grade education, no cigarettes since 1973, occasional alcohol, nothing to excess.  REVIEW OF SYSTEMS:  Unaware of any arrhythmias.  Complained of  slight increased fatigue over the last several years.  Denies any exertional anginal complaints other than the above-mentioned indigestion.  No PND or orthopnea. Rare puffiness of his ankles.  No claudication complaints.  He has been on Paxil for mild depressive symptoms by Dr. Oneta Rack.  PHYSICAL EXAMINATION:  VITAL SIGNS:  Blood pressure 188/82.  Heart rate is in the 60s.  LUNGS:  Completely clear.  CARDIAC:  Regular rhythm with a 1/6 systolic murmur.  I cannot appreciate a diastolic murmur.  ABDOMEN:  Soft and nontender with no masses.  Liver edge nonpalpable.  NECK:  Thyroid nonpalpable.  No carotid bruits.  EXTREMITIES:  Good pulses in the upper and lower extremities.  No edema.  SKIN:  Warm and dry.  NEUROLOGIC:  Grossly normal.  HEENT:  Posterior pharynx normal.  ASSESSMENT: 1. High-grade stenosis in his left anterior descending with a 90+% lesion that    is probably the responsible lesion for his resting "indigestion."  Because    of this high-grade stenosis, I have made arrangements for him to be    admitted to the hospital, started on IV heparin after his cath, and he has    been seen by [facility] with plans for surgery on Tuesday.  Because his episode    of indigestion occurred at rest, I am uncomfortable with him being at home    despite no symptoms in the last week. 2. Mild aortic insufficiency by cardiac  catheterization. 3. Dilatation of the left ventricle with 55% ejection fraction. 4. Mild pulmonary hypertension by cardiac cath, pulmonary artery pressure was    42/22. 5. Hypertension. 6. History of bradycardia, medication induced.  I have stopped his verapamil    and started him on Norvasc. 7. Adult onset diabetes mellitus. 8. Distant history of pericardial effusion secondary to minoxidil.  He is admitted somewhere to the hospital today. Dictated by:   Julieanne Manson, M.D. Attending Physician:  Loreli Dollar DD:  06/22/02 TD:  06/25/02 Job:  [id number]

## 2011-04-30 NOTE — Discharge Summary (Signed)
NAME:  Troy Warren, Troy Warren NO.:  [id number]   MEDICAL RECORD NO.:  [id number]          PATIENT TYPE:  INP   LOCATION:  2021                         FACILITY:  [facility]   PHYSICIAN:  Thereasa Solo. Little, M.D. DATE OF BIRTH:  1931-06-20   DATE OF ADMISSION:  03/04/2008  DATE OF DISCHARGE:  03/07/2008                               DISCHARGE SUMMARY   DISCHARGE DIAGNOSES:  1. Congestive heart failure, improved at discharge.  2. Ischemic cardiomyopathy with an EF of 20%.  3. Coronary artery bypass grafting in 2003 with catheterization in      November 2007, plans for medical therapy.  4. History of paroxysmal atrial fibrillation and sick sinus syndrome      status post pacemaker implant in the past.  5. Dementia.   HOSPITAL COURSE:  The patient is a 77 year old with a history of  ischemic cardiomyopathy and coronary disease.  The patient had a  pacemaker in the past for PAF and sick sinus syndrome.  In the last  week, he has had increasing dyspnea.  In the emergency room, chest x-ray  showed pulmonary edema.  His BNP was elevated.  The patient was admitted  to telemetry, started on IV diuretics.  CK-MB and troponins were  negative.  He felt that he has acute chronic systolic heart failure.  The patient does have PAF and has not felt to be a Coumadin candidate,  because of her past history of intracerebral bleed and dementia.  We  weaned off his IV nitrates.  The patient will follow up with Dr. Jenne Campus  and Dr. Clarene Duke as an outpatient.  We will consider possible upgrade to  BiV-ICD if the patient has recurrent heart failure.  Beta blocker was  added and his ACE inhibitor was increased this admission.   DISCHARGE MEDICATIONS:  1. Lisinopril 20 mg in the morning, 20 mg in the evening.  2. Cozaar 100 mg a day.  3. Aricept 10 mg a day.  4. Levothyroxine 0.075 mg a day.  5. Potassium 20 mEq once a day.  6. Lasix 80 mg twice a day.  7. Novolin sliding scale.  8. Imdur 30 mg a  day.  9. Pacerone 200 mg a day.  10.Pravastatin 40 mg a day.  11.Coreg 6.25 mg twice a day.  12.Vitamin D.  13.Fish oil.  14.Flaxseed oil.  15.Celexa 40 mg a day.  16.Iron daily.  17.Aspirin 81 mg a day.  18.Alphagan.  19.Azopt opthalmic drops.   DIAGNOSTIC STUDIES:  EKG shows paced rhythm.  Chest x-ray shows trace  right pleural effusion.  White count 6.7, hemoglobin 14.1, hematocrit  41, and platelets 152.  INR is 1.1.  Sodium 137, potassium 4.3, BUN 20,  and creatinine 1.2.  Liver functions are normal.  CK-MB and troponins  are negative.  Troponin is negative x3.  BNP on admission was 1039, at  discharge 397.  Lipid panel shows a cholesterol 113, LDL 57, HDL 39, and  TSH 0.85.   DISPOSITION:  The patient is discharged in stable condition and will  follow up with Dr. Clarene Duke as an outpatient.  He  will need to be  considered for possible BiV-ICD if the patient has recurrent heart  failure.      Troy Warren, P.A.    ______________________________  Thereasa Solo. Little, M.D.    Lenard Lance  D:  04/01/2008  T:  04/02/2008  Job:  [id number]   cc:   Darlin Priestly, MD  Kirk Ruths, M.D.

## 2011-04-30 NOTE — Op Note (Signed)
NAME:  Troy Warren, Troy Warren             ACCOUNT NO.:  [id number]   MEDICAL RECORD NO.:  [id number]          PATIENT TYPE:  AMB   LOCATION:  ENDO                         FACILITY:  [facility]   PHYSICIAN:  James L. Malon Kindle., M.D.DATE OF BIRTH:  1931/11/21   DATE OF PROCEDURE:  07/08/2006  DATE OF DISCHARGE:                                 OPERATIVE REPORT   PROCEDURE:  Colonoscopy and polypectomy.   MEDICATIONS:  Fentanyl 60 mcg, Versed 7 mg IV.   INDICATION:  Patient with a previous history of colon polyps.  This is done  due to the fact that he is overdue for a repeat, and he has had iron  deficiency anemia.  He has been on Coumadin, and that has been on hold.   DESCRIPTION OF PROCEDURE:  The procedure had been explained to the patient  and consent obtained.  In the left lateral decubitus position, the Olympus  scope was inserted and advanced.  This was done following an endoscopy.  Cecum, ileocecal valve, and appendiceal orifices seen.  Scope withdrawn.  Cecum and ascending colon normal.  Transverse colon normal.  Descending  colon revealed a 1-cm polyp that was removed with a snare and brought out  through the scope.  Scattered diverticula in the sigmoid colon.  At 11-cm  from the anal verge, a 1.5-cm rectal polyp was encountered.  This was  removed in 2 pieces and brought out through the scope.  There were no  bleeding in any of the sites.  The patient tolerated the procedure well.   ASSESSMENT:  1.  Polyps in the descending colon and rectum.  2.  Iron deficiency.  3.  History of polyps.   PLAN:  We will recommend repeating in 3 years, holding Coumadin for 5 days,  see back in the office in approximately 1 month.           ______________________________  Llana Aliment. Malon Kindle., M.D.     Waldron Session  D:  07/08/2006  T:  07/09/2006  Job:  [id number]   cc:   Thereasa Solo. Little, M.D.  Fax: [phone]   Lucky Cowboy, M.D.  Fax: [phone]   Jamison Neighbor, M.D.  Fax: [phone]

## 2011-04-30 NOTE — Op Note (Signed)
NAME:  Troy Warren, Troy Warren                       ACCOUNT NO.:  [id number]   MEDICAL RECORD NO.:  [id number]                   PATIENT TYPE:  OIB   LOCATION:  2895                                 FACILITY:  [facility]   PHYSICIAN:  Guadelupe Sabin, M.D.             DATE OF BIRTH:  1931-01-18   DATE OF PROCEDURE:  08/21/2002  DATE OF DISCHARGE:                                 OPERATIVE REPORT   PREOPERATIVE DIAGNOSES:  Senile nuclear cataract, left eye; proliferative  diabetic retinopathy; status post panretinal photocoagulation.   POSTOPERATIVE DIAGNOSES:   OPERATION:  Planned extracapsular cataract extraction --  phacoemulsification, primary insertion of posterior chamber intraocular lens  implant.   SURGEON:  Guadelupe Sabin, M.D.   ASSISTANT:  Nurse.   ANESTHESIA:  Local 4% Xylocaine, 0.75% Marcaine retrobulbar block, topical  tetracaine, intraocular Xylocaine, anesthesia standby required in this  cardiac patient; patient given sodium Pentothal intravenously during the  period of retrobulbar injection.   DESCRIPTION OF PROCEDURE:  After the patient was prepped and draped, a lid  speculum was inserted in the left eye.  The eye was turned downward and a  superior rectus traction suture placed.  Schiotz tonometry was recorded at 7  scale units with a 5.5 g weight.  A peritomy was performed adjacent to the  limbus from the 11 to 1 o'clock position.  The corneoscleral junction was  cleaned and a corneoscleral groove made with a 45 degree Superblade.  The  anterior chamber was then entered with a 2.5-mm diamond keratome at the 12  o'clock position and a 15 degree blade at the 2:30 position.  Using a bent  26-gauge needle on a Healon syringe, a circular capsulorrhexis was begun and  then completed with the Grabow forceps.  Hydrodissection and  hydrodelineation were performed using 1% Xylocaine.  A 30 degree  phacoemulsification tip was then inserted with slow, controlled  emulsification of the lens nucleus with some lens chopping.  Following  removal of the nucleus, the residual cortex was aspirated with the  irrigation-aspiration tip.  There suddenly appeared to be a small circular  round hole in the posterior capsule.  The patient had additional Healon  added and the hole inspected.  There did not appear to be any vitreous  presentation into the anterior chamber or into the operative incision.  A  small amount of additional irrigation and aspiration were achieved and then  it was elected to stop, feeling that the hole was possibly enlarging  slightly and that it would be impossible to insert a posterior chamber  implant.  It was therefore elected to proceed with insertion of the  posterior chamber implant.  An [employer] [id number] silicone three-  piece posterior chamber intraocular lens implant was then prepared, diopter  strength +20.50; this was inserted with the McDonald forceps into the  anterior chamber and then centered into the capsular bag using the [employer]  lens rotator.  The lens appeared to be well-centered.  It was elected to  perform a peripheral iridectomy.  The Healon which had been used during the  procedure was aspirated and replaced with balanced salt solution and Miochol  ophthalmic solution.  The incisions were once again checked and there did  not appear to be any vitreous in the anterior chamber.  It was then elected  to close.  The operative incisions appeared to be self-sealing and no  sutures were required.  Maxitrol ointment was instilled in the  conjunctival cul-de-sac and a light patch and protective shield applied.  Duration of procedure and anesthesia administration -- 45 minutes.  The  patient tolerated the procedure well in general and left the operating room  for the recovery room in good condition.                                               Guadelupe Sabin, M.D.    HNJ/MEDQ  D:  08/21/2002  T:   08/22/2002  Job:  [id number]

## 2011-04-30 NOTE — H&P (Signed)
NAME:  Troy Warren, Troy Warren NO.:  [id number]   MEDICAL RECORD NO.:  [id number]           PATIENT TYPE:   LOCATION:                                FACILITY:  [facility]   PHYSICIAN:  Jamison Neighbor, M.D.       DATE OF BIRTH:   DATE OF ADMISSION:  03/31/2006  DATE OF DISCHARGE:                                HISTORY & PHYSICAL   HISTORY OF PRESENT ILLNESS:  This is a 77 year old male who has  a  complicated  urologic history.  The patient has had moderate bladder outlet  symptoms for some time, although those have gotten worse recently.  The  patient developed problems with swelling of the right hemiscrotumand was  thought on ultrasound to have a hydrocele, possibly involving the cord.  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  He was taken back to the operating room where he underwent a  right hemiscrotectomy along with right inguinal exploration with removal of  the entire cord up to the internal ring and extensive resection of all the  soft tissue from the entire right side of the scrotum.  This was taken in  block including a portion of the scrotum and the pathologist felt that  adequate margins had been obtained.  The patient was subsequently staged and  a CT scan showed some fatty nodes, although these were felt to be generally  benign.  The patient had a repeat CT scan done more recently and was found  to have no change in the nodes.  These, however, were obviously of concern  because the patient does, of course, have a liposarcoma.  He does not have  any evidence of neural tissue in the femoral area.  The patient has  developed a secondary problem with worsening bladder obstruction as well as  an elevated PSA.  Because of the PSA he underwent transurethral ultrasound  which revealed a large prostate, 120 g in size.  He was biopsied to rule out  carcinoma of the prostate and had a negative biopsy but went into retention.  He has failed two voiding trials and for that reason, is to undergo  prostatectomy.  The patient certainly was considered for TURP, but given his  large prostate size, it was felt that the optimal treatment will be an open  prostatectomy.  It was also felt that that would give [place] an opportunity in a  single setting to sample the lymph nodes.  The patient has been advised that  these are two separate problems that we are going to try deal with in a  single setting.  He gave full informed consent as to be admitted following  that  procedure.   PAST MEDICAL HISTORY:  1.  Hypertension.  2.  Atrial fibrillation.  He has a pacemaker in place and has been on      Coumadin.  All that has been discontinued prior to the procedure.  3.  The  patient is known to have shortness of breath.  4.  He has had lower extremity weakness.  5.  The patient does have some problems with chronic anemia.  6.  He is known to have diabetes.  7.  Thyroid disease.  8.  There is a history of esophageal reflux.   PAST SURGICAL HISTORY:  1.  His previous surgery includes the right hydrocelectomy.  2.  Right orchiectomy.  3.  Right hemiscrotectomy with inguinal exploration late last year.  4.  He had two vessels coronary bypass graft in July of 2003.  5.  He previously had cholecystectomy.  6.  Tonsillectomy.  7.  Laser and cataract surgery for the eye.   MEDICATIONS:  He is on an extensive list of medications which include:  Sertraline, levothyroxine, Pravastatin, Zetia, Lomotil, potassium,  alprazolam, Cordarone, Coreg, Diovan, Coumadin which has been discontinued,  eye drops, insulin, Lasix, and he was given antibiotics preoperatively.  The  patient will be continued on these medications aside from the Coumadin.   SOCIAL HISTORY:  He stopped smoking at age 73.  He does not use alcohol.   FAMILY HISTORY:  Unremarkable with the  exception of what has been described  above.   REVIEW OF SYSTEMS:  Unremarkable with the exception of what has been  described above.   PHYSICAL EXAMINATION:  VITAL SIGNS:  Temperature 97, pulse 72, respirations  16, blood pressure 160/78.  GENERAL:  The patient is a well-developed, well-nourished male in no  distress with Foley catheter in place.  HEENT: Normocephalic, atraumatic.  NEUROLOGIC:  Cranial nerves II-XII are grossly intact.  NECK:  Supple.  No adenopathy or thyromegaly.  LUNGS:  Clear.  HEART:  The patient has had a pacemaker in place.  His heart had a paced  rhythm.  ABDOMEN:  His abdomen is soft, nontender, with no palpable masses, rebound,  or guarding.  The patient has a well-healed incision in the right groin.  EXTREMITIES:  No cyanosis, clubbing, or edema.  RECTAL:  Examination shows a 4+ massive prostate secondary to what is felt  to be benign prostatic hypertrophy.   IMPRESSION:  1.  Recent history of liposarcoma with nodes detected on CT.  Question      metastatic disease.  2.  Biopsy proven benign prostatic hypertrophy with urinary retention.  3.  Coronary artery disease.  4.  Hypertension.  5.  Atrial fibrillation.  6.  Insulin dependent diabetes mellitus.  7.  Anemia.  8.  Thyroid disease.  9.  Glaucoma.   PLAN:  Admit following suprapubic prostatectomy andpelviclymph node  dissection.           ______________________________  Jamison Neighbor, M.D.  Electronically Signed     RJE/MEDQ  D:  03/31/2006  T:  03/31/2006  Job:  [id number]   cc:   Jamison Neighbor, M.D.  Fax: [phone]   Lucky Cowboy, M.D.  Fax: [phone]   Thereasa Solo. Little, M.D.  Fax: [phone]

## 2011-04-30 NOTE — Discharge Summary (Signed)
NAME:  Troy Warren, Troy Warren NO.:  [id number]   MEDICAL RECORD NO.:  [id number]          PATIENT TYPE:  INP   LOCATION:  3033                         FACILITY:  [facility]   PHYSICIAN:  Casimiro Needle L. Reynolds, M.D.DATE OF BIRTH:  [birth year]   DATE OF ADMISSION:  10/26/2006  DATE OF DISCHARGE:  11/03/2006                                 DISCHARGE SUMMARY   DISCHARGE DIAGNOSES:  1. Left temporal intracranial hemorrhage secondary to Coumadin.  2. Atrial fibrillation, rate controlled, on chronic Coumadin prior to      admission.  3. Hypertension.  4. Diabetes.  5. Coronary artery disease, status post coronary artery bypass grafting in      2004 and an admission for angina with negative catheterization in May      2007.  6. Pacemaker secondary to bradycardia.  7. 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  Biopsies were negative for prostate cancer.   DISCHARGE MEDICATIONS:  1. Amiodarone 200 mg a day.  2. Coreg 25 mg q.12h.  3. Diovan 320 mg a day.  4. Lasix 40 mg b.i.d.  5. Zoloft 100 mg a day.  6. Potassium 10 mEq q.i.d.  7. Synthroid 75 mcg a day.  8. Sular 10 mg a day.  9. Pravachol 40 mg a day.  10.Imdur 30 mg a day.  11.Lisinopril 10 mg a day.  12.Sliding scale insulin q.i.d.  13.Protonix 40 mg a day.  14.Lantus 17 unit q.12h.   STUDIES PERFORMED:  1. CT of the brain on admission showed a sizeable left temporal      intracerebral hemorrhage.  2. Follow-up CT at 24 hours shows minimal increase in size of left      temporoparietal hematoma secondary to hemorrhagic infarct.  Minimal      increase in left-to-right shunt, now 6 mm.  Development of      intraventricular hemorrhage in the lateral ventricles and likely the      fourth ventricle.  Persistent prominence of the occipital horn of the      right lateral ventricle.  Soft tissue density in the subcutaneous fat      of the left occipital scalp.  3. CT of the head  at 48 hours shows slight contraction of left      temporoparietal hematoma.  Intraventricular hemorrhage is slightly      smaller.  Stable subfalcine herniation, no new intracranial findings.  4. Chest x-ray shows interval development of asymmetric interstitial      edema, left worse than right, PICC placement, stable cardiomegaly, and      this was on the day of admission.  5. EKG shows atrial paced rhythm with left axis deviation, nonspecific      intraventricular conduction block.  6. Carotid Doppler and 2 D echocardiogram not performed.   LABORATORY STUDIES:  CBC with hemoglobin 12.8, hematocrit 37.6, otherwise  normal.  Differential with lymphs 14, otherwise normal.  INR on admission  3.2, down to 1.2 day of discharge.  Chemistry with potassium ranging 2.9 to  3.6, glucose  ranging 81 to 198.  Chemistry with total bilirubin 1.5,  indirect bilirubin 1.3, direct bilirubin 0.2.  Liver function tests normal.  Hemoglobin A1c 6.0.  He is O positive, negative antibodies.  Cardiac enzymes  negative.  Fasting lipids, homocysteine and hemoglobin A1c pending at time  of discharge.   HISTORY OF PRESENT ILLNESS:  [name] is a 77 year old right-  handed Caucasian male with a history of coronary artery disease, atrial  fibrillation and anticoagulation.  The patient woke the morning of admission  with confused speech.  He had no associated weakness or facial droop,  although the family did notice he was somewhat unsteady on his feet.  He  seemed to worsen a bit over the day.  He was seen by his primary physician  and was advised to go to the emergency room for evaluation for possible  stroke. In the emergency room he underwent a CT of the head, which  demonstrated a sizeable left temporal intracerebral hemorrhage.  His blood  pressure was elevated in the emergency department.  He received IV  labetalol.  He has no previous history of similar symptoms.  He was admitted  to the ICU for  further stroke evaluation.   HOSPITAL COURSE:  Neurosurgery was consulted.  It was not felt he was a  surgical candidate at admission, but they monitored him throughout the  hospitalization and surgery was not needed.  For initial anticoagulation  reversal, the patient received fresh frozen plasma and vitamin K.  At 24  hours the stroke had increased but by 48 hours, the hematoma was decreasing.  The patient has significant expressive and receptive aphasia but overall, no  weakness in his extremities.  He was evaluated by PT and OT.  He was not  felt to be a rehab candidate secondary to his high level of ambulation.  He  was initially arranged to have home health speech therapy follow him at  discharge; however, wife is concerned with her current ability to care for  him, as he is so aphasic and just general weakness.  They have opted for  skilled nursing facility at the time of discharge, and he will be  transported to one.  Speech therapy has followed him.  He remains on a D-3  nectar-thick liquid diet through spoon only.  They will need close follow-up  at the skilled facility.  The patient has vascular risk factors as above.  He had significant problems with his diabetes in the hospital on the  Glucommander at one time with significant insulin adjustments.  The patient  has also had significant hypertensive medicine adjustments in the hospital.  These will need to be closely watched as an outpatient.   CONDITION ON DISCHARGE:  Patient alert and oriented x2.  He is in no acute  distress.  He has fluent aphasia.  He moves all extremities well.  Chest is  clear to auscultation and his heart rate is regular.   DISCHARGE PLAN:  1. Discharge to skilled nursing facility for continuation of PT, OT, and      speech therapy.  2. Close follow-up of swallowing, increase as able by barium swallow as      needed.  3. Tight control of risk factors with goal LDL less than 100, goal     systolic  blood pressure less than 130, and goal      glucose less than 140.  4. He will need primary care follow-up at the nursing home, then  with his      primary care physician.  He will need to follow up with Dr. Delia Heady in 2-3 months after discharge from the hospital.      Annie Main, N.P.      Marolyn Hammock. Thad Ranger, M.D.  Electronically Signed    SB/MEDQ  D:  11/02/2006  T:  11/02/2006  Job:  [id number]   cc:   Pramod P. Pearlean Brownie, MD  Thereasa Solo Little, M.D.  Lucky Cowboy, M.D.

## 2011-04-30 NOTE — Op Note (Signed)
NAME:  Troy Warren, Troy Warren             ACCOUNT NO.:  [id number]   MEDICAL RECORD NO.:  [id number]          PATIENT TYPE:  AMB   LOCATION:  ENDO                         FACILITY:  [facility]   PHYSICIAN:  James L. Malon Kindle., M.D.DATE OF BIRTH:  July 23, 1931   DATE OF PROCEDURE:  07/08/2006  DATE OF DISCHARGE:                                 OPERATIVE REPORT   PROCEDURE:  Esophagogastroduodenoscopy and biopsy.   MEDICATIONS:  Cetacaine spray, Fentanyl 50 mcg, versed 7 mg IV.   INDICATION:  The patient has a history of colon polyps and has been on  Coumadin.  He has had no clear GI bleeding, but was found to be anemic with  a hemoglobin of 9.6 and a low iron level.  This is done to look for a cause.  He does have a previous history of colon polyps.   DESCRIPTION OF PROCEDURE:  The procedure was explained to the patient and  consent obtained.  In the left lateral decubitus position, the Olympus scope  was inserted and advanced into the stomach __________.  Duodenum normal.  Several biopsies taken of the second duodenum to look for celiac disease.  The scope was withdrawn back into the stomach.  A prepyloric polyp was seen,  appeared normal mucosa.  Hyperplastic.  This was biopsied.  There was  streaky gastritis in the antrum.  Polyp was approximately 1 cm or so in  size.  The fundus and cardia were seen well.  In the retroflexed view, there  was a hiatal hernia with a widely patent GE junction.  The distal and  proximal esophagus were endoscopically normal.  The scope was withdrawn.  The patient tolerated the procedure well.   ASSESSMENT:  1.  Iron deficiency.  2.  Hiatal hernia, possible reflux.  3.  Gastric polyp, probably hyperplastic.   PLAN:  We will check path, rule out celiac disease, go ahead with  colonoscopy at this time, and we will recommend the patient take Prilosec  OTC.           ______________________________  Llana Aliment. Malon Kindle., M.D.     Waldron Session  D:  07/08/2006   T:  07/09/2006  Job:  [id number]   cc:   Dr. Rico Junker. Little, M.D.  Fax: [phone]   Jamison Neighbor, M.D.  Fax: [phone]

## 2011-04-30 NOTE — Cardiovascular Report (Signed)
NAME:  Troy Warren, Troy Warren NO.:  [id number]   MEDICAL RECORD NO.:  [id number]                   PATIENT TYPE:  INP   LOCATION:  2008                                 FACILITY:  [facility]   PHYSICIAN:  Thereasa Solo. Little, M.D.              DATE OF BIRTH:  1931/01/23   DATE OF PROCEDURE:  05/21/2003  DATE OF DISCHARGE:                              CARDIAC CATHETERIZATION   INDICATIONS FOR TEST:  The patient is a 77 year old male who had bypass  surgery one year earlier.  He had an episode of exertional chest pain about  a week ago.  Presented to my office for routine follow-up yesterday  complaining of dizziness and lightheadedness.  Was in new onset rapid atrial  fibrillation.  Because of his complaints of chest pain and the concern about  long-term Coumadin use for the atrial fibrillation, cardiac catheterization  was performed to resolve any early graft failure.   PROCEDURE:  After obtaining informed consent the patient was prepped and  draped in the usual sterile fashion exposing the right groin.  Following  local anesthetic with 1% Xylocaine the Seldinger technique was employed and  the 5-French introducer sheath was placed into the right femoral artery.  Left and right coronary arteriography, ventriculography, and an aortic root  injection for aortic insufficiency was performed.  During the procedure wire  exchange technique was performed.  The patient was also given 1 mg of Versed  for relaxation.   RESULTS:   HEMODYNAMIC MONITORING:  Central aortic pressure was 174/82.  Left  ventricular pressure was 171/11 with no aortic valve gradient noted at time  of pullback.   VENTRICULOGRAPHY:  Ventriculography in the RAO projection revealed the apex  and distal anterior wall to be hypokinetic.  The anterior basilar, posterior  basilar, and inferior wall appeared to have normal contractility.  The  ejection fraction was 40% and the end-diastolic pressure was  16.   Aortic root injection:  Aortic root injection using 20 mL of contrast at 15  mL/second revealed a small, but discreet aortic insufficiency jet.  This is  consistent with mild (+1) aortic insufficiency.  The aortic root itself did  not appear to be enlarged/dilated.   CORONARY ARTERIOGRAPHY:  On fluoroscopy calcification was seen in the  distribution of the left main and LAD.  1. Left main:  There was actually a common ostium for the LAD and     circumflex.  2. LAD:  The LAD had calcification in its proximal segment and mild     irregularities.  At the first septal perforator was a focal area of 80%     narrowing.  The first diagonal that came off just before the septal     perforator had ostial 60-70% narrowing and is not bypassed.  The distal     LAD was free of disease.  The second diagonal showed evidence of  bidirectional flow consistent with graft insertion into this vessel.  3. Circumflex:  The circumflex in the AV groove was free of significant     disease with only mild irregularities at OM number 1.  OM number 2,     however, was not visualized except by the graft.  OM number 3, 4, and 5     were all free of significant disease.  The ostium of OM number 1 had 45%     narrowing.  4. Right coronary artery:  The right coronary artery had mild proximal     irregularities.  The PDA was free of disease.    GRAFTS:  1. The saphenous vein graft to the OM was widely patent.  There was,     however, minimal irregularities of 10-20% at the proximal segment.  OM     number 2 itself was well visualized.  There was retrograde filling to a     subtotaled area and some faint filling of the ongoing circumflex was     noted.  2. Internal mammary artery to diagonal number 2:  The IMA itself was very     tortuous, but free of disease.  It inserted into the IM number 2 and     there was retrograde flow back up the diagonal down the LAD as well as     antegrade flow down the diagonal.   Distal to the insertion site in the     more distal portion of the diagonal was a focal area of narrowing of     about 50%.  There was some minimal tapering at the distal anastomotic     site.  There was brisk TIMI 3 flow.   CONCLUSION:  1. Moderate left ventricular dysfunction with a 40% ejection fraction and     mild dilatation of left ventricle.  2. +1 aortic insufficiency.  3. Patent grafts.  4. Mild to moderate residual coronary disease involving the ostium of     diagonal number 1 at 60%, an ostial narrowing in obtuse marginal number 1     of around 30%, and an area just distal to the insertion site of the     internal mammary artery to the diagonal 50.   DISCUSSION:  At this point these areas of CAD can be managed with medical  therapy.  Plan to start the patient on Coumadin for his atrial fibrillation  today.  His ventricular response is already in the 80s compared to the 130s  at time of admission.  He will be discharged on amiodarone with plans for  outpatient conversion if he does not convert with medical therapy.                                               Thereasa Solo. Little, M.D.    ABL/MEDQ  D:  05/21/2003  T:  05/21/2003  Job:  [id number]   cc:   Cath Lab   Lucky Cowboy, M.D.  [address]  [place], [place] [address]  Fax: [phone]

## 2011-04-30 NOTE — Discharge Summary (Signed)
NAME:  Troy Warren, Troy Warren NO.:  [id number]   MEDICAL RECORD NO.:  [id number]          PATIENT TYPE:  INP   LOCATION:  1417                         FACILITY:  [facility]   PHYSICIAN:  Jamison Neighbor, M.D.  DATE OF BIRTH:  28-Sep-1931   DATE OF ADMISSION:  03/31/2006  DATE OF DISCHARGE:  04/06/2006                                 DISCHARGE SUMMARY   PRINCIPAL PROCEDURE:  1.  Pelvic node  dissection.  2.  Suprapubic prostatectomy.   DISCHARGE DIAGNOSES:  1.  BHP with bladder obstruction.  2.  Atrial fibrillation.  3.  Postoperative anemia.  4.  Hypertension.  5.  Diabetes.  6.  Esophageal reflux disease.  7.  Urgency.  8.  Hypothyroidism.  9.  Coronary artery disease.  10. Glaucoma.  11. Past history of coronary artery bypass.  12. Past history of pacemaker.  13. Long-term use of anticoagulants.  14. Past history of liposarcoma of the right spermatic floor.   HISTORY OF PRESENT ILLNESS:  This 77 year old male has a somewhat  complicated urologic history.  The patient has had bladder outlet symptoms  for some time which have gotten significantly worse.  He also developed some  problems with swelling of the right hemiscrotum.  Evaluation was first felt  to be hydrocele, but when it was explored, he had a somewhat irregular mass  and it was resected.  It turned out to be a liposarcoma of the port.  The  patient was taken back to the operating room and underwent a right  hemiscrotectomy, along with right inguinal  resection  up to the internal  ring, and extensive resection of all soft tissue in that area.  This was  shown to have completely removed the tumor with good margins and no evidence  of recurrence.  The patient has had CT scan done which shows fatty nodes and  was first felt to be in a knot.  These were still present, and for that  reason, it is felt that the patient really should undergo a node dissection.   In the interim, the patient has developed  problems with LA to PSA, and when  he underwent a biopsy, he went into urinary retention.  He has a large  prostate of 120 g in size, and it is felt that this is too large to treat  transurethrally.  He is to undergo a simultaneous open prostatectomy as well  as lymph node dissection.   The patient's initial physical examination was remarkable for the incision  of the right hemiscrotum which is not accompanied by any evidence of  recurrence as well as markedly enlarged prostate.  The patient has been on  Coumadin, but that has been discontinued.   PAST MEDICAL HISTORY/PHYSICAL EXAMINATION:  Well delineated in the initial  History and Physical.   HOSPITAL COURSE:  The patient came to the operating room and underwent a  node dissection on the right hand side.  The left hand side really did not  show any evidence of nodes.  He had an open prostatectomy performed as well.  His postoperative course was  as expected.  The patient did have some blood  loss requiring transfusion, but his urine did clear.  He had both suprapubic  tube and Foley catheter in place.  He had some soreness in his abdomen as to  be expected.  He slowly recovered from the procedure.  Turns out that his  pathology was negative for both cancer and the nodes were negative as well.  The patient was ready for discharge.  He was sent home with the catheter  draining and the suprapubic tube plugged.  He will return to see [place] in  follow up.  He did go home on Colace as well as Vicodin and was maintained  on Macrodantin one daily until all tubes could be removed.           ______________________________  Jamison Neighbor, M.D.  Electronically Signed     RJE/MEDQ  D:  04/21/2006  T:  04/22/2006  Job:  [id number]

## 2011-04-30 NOTE — Discharge Summary (Signed)
NAME:  Troy Warren, Troy Warren NO.:  [id number]   MEDICAL RECORD NO.:  [id number]          PATIENT TYPE:  INP   LOCATION:  2001                         FACILITY:  [facility]   PHYSICIAN:  Darcella Gasman. In[place], N.P.  DATE OF BIRTH:  [birth year]   DATE OF ADMISSION:  04/21/2006  DATE OF DISCHARGE:  04/23/2006                                 DISCHARGE SUMMARY   DISCHARGE DIAGNOSES:  1.  Chest pain, negative myocardial infarction.  2.  Coronary artery disease, status post bypass grafting, now with      nonobstructive disease and patent graft.  3.  History of prostate surgery.  4.  Ejection fracture of 40%.  5.  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  6.  Diabetes mellitus type 2.  7.  History of resection of right spermatic cord.  8.  Paroxysmal atrial fibrillation (PAF).   DISCHARGE CONDITION:  Improved.   PROCEDURE:  On Apr 22, 2006, cardiac catheterization by Dr. Lenise Herald.   DISCHARGE MEDICATIONS:  1.  Coumadin 5 mg daily.  2.  Coreg 12.5 twice a day.  3.  Furosemide 200 mg a 1/2 a tab daily.  4.  K-Dur 20 mEq daily.  5.  Hold Diovan until a.m., then begin 320 mg daily of Diovan.  6.  Levothyroxine 75 mcg daily.  7.  Zoloft 100 mg daily.  8.  Novolin-N 28 units in the morning and 12 units in the evening.  9.  Aspirin 81 mg daily.   DISCHARGE INSTRUCTIONS:  1.  Low-fat, low-salt diabetic diet.  2.  Increase activity slowly.  3.  Wash cath site with soap and water.  Call if any bleeding, swelling or      drainage.  4.  Have lab work done Tuesday morning to check for kidney function and pro      time.   HISTORY OF PRESENT ILLNESS:  A 77 year old married, white male with prior  history of bypass grafting and multiple surgical procedures secondary to  spermatic cord cancer, was admitted to the hospital Apr 21, 2006 after  shortness of breath for 2-3 days prior to admission.  The afternoon of  admission, he was working in the kitchen when he  developed severe shortness  of breath and chest tightness.  He tried to rest, but symptoms were getting  worse.  He was transported to [facility] ER.  The patient's symptoms were  similar to those prior to his bypass grafting.   PAST MEDICAL HISTORY:  1.  Bypass grafting as stated.  2.  EF of 39% by nuclear study in January of 2006.  3.  Permanent pacemaker by Dr. Jenne Campus secondary to bradycardia in 2004.  4.  Hypothyroidism.  5.  Diabetes mellitus type 2.  6.  Hypertension.   Family history, social history and review of systems, see H&P.   ALLERGIES:  No known allergies.   OUTPATIENT MEDS:  1.  Lasix 40 for 2 days.  2.  Klor-Con 10 x4 days.  3.  Verapamil 120 b.i.d.  4.  Levothyroxine 75 mcg daily.  5.  Coreg 12.5, 1/2 a tablet  twice a day.  6.  Coumadin 5 mg daily.  7.  Lomotil 1 pill 4 times a day.  8.  Diovan 320 daily.  9.  Cordarone 100 mg daily.  10. Xanax 1 mg p.r.n.  11. Sertraline 10 mg daily.  12. Insulin Novolin 28 in the morning, 12 in the evening.   PHYSICAL EXAM AT DISCHARGE:  VITAL SIGNS: Blood pressure 158/82, pulse 73,  respirations 18, temp 97.9, oxygen saturation on room air 97%.   LABORATORY DATA:  Hemoglobin on admission 10.3, hematocrit 30.1, WBCs 15.9,  platelets 253.  Hemoglobin at discharge dropped to 9.5, hematocrit 27.7, and  white count 5.9.   Neutrophils 71, lymphs 18, mono 8, eos 3, baso 1, pro time 16.4, INR of 1.3,  PTT 30, heparin 0.52.   Chemistry: Sodium 137.  Potassium originally 2.9,  and he was given  supplements and came up to 3.7.  Again, went down to 3.3 and was given  supplements.  Chloride 103, CO2 of 23, glucose 123, BUN 16, creatinine 1.6,  calcium 8.3, total protein 5.9, albumin 2.7, AST 20, ALT 9, ALP 93, total  bili 0.8, magnesium 2.1.  Creatinine at discharge was 1.3.   Cardiac enzymes: CK 28, MB is 1.3, troponin I 0.04, BNP was 143.   Total cholesterol 100, triglycerides 74, HDL 36 and LDL 49, TSH 1.464.   Chest  x-ray: Cardiac enlargement, but other than some basilar atelectasis,  no acute pulmonary findings.   Chest x-ray May 10th: Unusual P axis, right superior axis deviation  incomplete, bundle branch block.   A 2-D echo: EF was 20% range, mean 15-25.  Moderate mitral valve regurg.  Left atrium was moderately dilated.  Right ventricle was mildly dilated.  Moderate tricuspid valve regurg.   On cardiac cath: It stated an EF on cardiac cath was 40%.   HOSPITAL COURSE:  Mr. Sawchuk was admitted by the on call for Dr. Clarene Duke,  secondary tight chest pain and dyspnea.  He was placed on IV heparin and  followed cardiac enzymes were negative.  He remained stable, and then  underwent cardiac cath Apr 22, 2006 with patent graft.   Please see Dr. Mikey Bussing dictated cath report.  By Apr 23, 2006, he was pain  free.  No complaints.  We were adding back his Coumadin for his atrial fib,  and he will followup as an outpatient with Dr. Clarene Duke.      Darcella Gasman. Annie Paras, N.P.     LRI/MEDQ  D:  06/02/2006  T:  06/02/2006  Job:  [id number]   cc:   Thereasa Solo. Little, M.D.  Fax: [phone]   Nicki Guadalajara, M.D.  Fax: [phone]

## 2011-04-30 NOTE — Discharge Summary (Signed)
NAME:  Troy Warren, Troy Warren NO.:  [id number]   MEDICAL RECORD NO.:  [id number]                   PATIENT TYPE:  INP   LOCATION:  2008                                 FACILITY:  [facility]   PHYSICIAN:  Thereasa Solo. Little, M.D.              DATE OF BIRTH:  Jan 31, 1931   DATE OF ADMISSION:  05/20/2003  DATE OF DISCHARGE:  05/27/2003                                 DISCHARGE SUMMARY   ADMISSION DIAGNOSES:  1. Chest pain, rule out myocardial infarction.  2. New-onset atrial fibrillation with rapid ventricular response.  3. Hypertension.  4. Non-insulin-dependent diabetes mellitus.   DISCHARGE DIAGNOSES:  1. Atrial fibrillation with controlled ventricular response.  2. Coronary artery disease, status post coronary artery bypass graft, July     of '03, with patent grafts by catheterization, May 21, 2003.  3. Hypertension, controlled.  4. Non-insulin-dependent diabetes mellitus, controlled.  5. Penile bleeding secondary to Foley catheter trauma.  6. Urinary tract infection.   PROCEDURES:  1. Left heart catheterization, May 21, 2003.  2. Flexible cystoscopy, May 24, 2003.   COMPLICATIONS:  None.   DISCHARGE STATUS:  Stable.   ADMISSION HISTORY:  This is a 77 year old male with known CAD being status  post CABG, July of '03.  Also with a history of NIDDM and hypertension.   The week prior to this admission, he experienced some exertional chest  discomfort while he was working in his garden; it was rather short-lived and  spontaneously resolved with rest for a few minutes.  Today, he experienced  two episodes of generalized lightheadedness but denies any near syncope.  He  had no awareness of tachycardia, arrhythmias, or palpitations.  No recurrent  chest pain.  No shortness of breath.  He presented to our office with these  complaints and was subsequently admitted when found to be in rapid atrial  fibrillation.   PHYSICAL EXAMINATION:  GENERAL:  He was  conscious and alert.  LUNGS:  Clear.  HEART:  Rapid, irregular rhythm.  EXTREMITIES:  He had no peripheral edema.  ABDOMEN:  Benign with normal bowel sounds.  NEUROLOGIC:  He had no neurological deficit.   HOSPITAL COURSE:  He was admitted, started on IV heparin and IV Cardizem.  His p.o. verapamil was discontinued.  He was started on amiodarone p.o.  Cardiac catheterization was planned for the next morning.   He did undergo a 2-D echocardiogram in the office prior to admission; left  atrium was moderately dilated at 5.2 cm.  EF was noted to be 30 to 35% with  some anterior wall hypokinesis noted.  Left ventricle was noted to be  dilated.   Admission labs showed a normal CBC and coag studies.  CMP was normal with  the exception of hypokalemia at 3.1 and hyperglycemia at 209.  Cardiac  enzymes as well as TSH were all normal.   On May 21, 2003, patient remained in  atrial fibrillation with a variable  heart rate ranging in the 70s up to the 120s.  Blood pressure was stable.  O2 sats were good.  Potassium remained low at 2.8, requiring further  replacement.  His exam was essentially unchanged.   He was taken to the cardiac cath lab on May 21, 2003; he was found to have  patent grafts.  There was notable anterior and apical wall hypokinesis.  EF  was approximately 40%.   He remained in atrial fibrillation.  He continued on IV heparin, and  Coumadin load was begun.  He continued on a Cardizem drip for a variable  heart rate; however, this was changed over to p.o. on May 22, 2003.  His  blood pressure was controlled.  Potassium was still mildly depressed at 3.4;  replacement continued.  He developed some urinary retention post  catheterization, requiring insertion of a Foley catheter; he complained of a  moderate amount of pain with its insertion.  It was removed the following  morning; he, later, developed penile bleeding with obvious mild bleeding  noted at the meatus.  There was  gross hematuria also noted.  At this point,  Dr. Logan Bores, who had seen the patient in the past, was called to see the  patient with him also having some complaints of BPH.   On May 23, 2003, blood pressure and heart rate were noted to be elevated;  blood pressure was 169/99 with a heart rate in the 100s.  His exam was  unchanged.  Potassium had now normalized to 3.5.  Hemoglobin was stable at  13.9.  INR was 1.3.  His dose of Cardizem was increased from 180 mg to 240  mg.  He continued on amiodarone 400 mg p.o. b.i.d.  IV heparin continued.  His Coumadin had been held the day previously secondary to his penile  bleeding with a temporary interruption in IV heparin until bleeding had  stopped.  His heparin was restarted again several hours later.  His blood  sugars were noted to be elevated for the last one or two days, and his dose  of Glucotrol was increased at this point from 5 mg b.i.d. to 10 mg b.i.d.  Continued some potassium replacement.  Coumadin was restarted at this point  with apparent resolution of his penile bleeding.   On May 24, 2003, patient underwent a flexible cystoscopy secondary to this  bleeding; there was no significant bleeding seen in the bladder.  There was  no evidence of major trauma.  An 18-French Foley catheter was placed.  He  was started empirically on antibiotic therapy, and he was also started on  Proscar for BPH symptoms.  It was advised for him to be sent home with the  Foley catheter in place and a leg bag; the Foley catheter would be removed  on an outpatient basis by Dr. Logan Bores once the urine was clear.  He was  started on low-dose Toprol secondary to continued mild tachycardia with a  heart rate in the low 100s.   He continued to complain of some discomfort secondary to the Foley catheter;  he continued to have gross hematuria noted in the Foley bag.  INR was now  1.5.  Coumadin load was continued.  IV heparin was discontinued, and he was on b.i.d.  Lovenox.  Heart rate was still elevated in the 100s along with  mild hypertension; Toprol was increased from 25 to 50 mg per day.   By May 26, 2003,  his INR was now mildly supratherapeutic at 3.3.  Potassium  had now normalized.  Blood pressure still mildly elevated at 160/100.  Heart  rate now down into the 90s.  No other medication changes.  Still continued  with some mild bleeding into the Foley bag.   By May 27, 2003, patient was ready for discharge.  His INR was still  somewhat supratherapeutic at 3.9, but this was secondary to antibiotic  therapy.  He remained in an atrial fibrillation with a controlled  ventricular response with a heart rate now in the 80s.  Blood pressure was  better controlled at 154/76.  He remained afebrile.  He had no complaints  other than wanting the Foley catheter out.  His blood sugars were now  controlled with a CBG on the morning of discharge at 138.  He had had  Glucophage 500 mg daily added on May 24, 2003.  We, again, contacted Dr.  Logan Bores prior to his discharge in regards to removal of the Foley catheter.   He was discharged home on May 27, 2003, without further incident.   DISCHARGE MEDICATIONS:  1. Amiodarone 200 mg two tablets daily.  2. Diovan 320 mg daily.  3. Paxil 30 mg one-half tablet daily.  4. Glyburide 5 mg two tablets b.i.d.  5. Coumadin 5 mg as directed.  6. Cardizem CD 240 mg daily.  7. Glucophage 500 mg daily.  8. Proscar 5 mg daily.  9. Toprol XL 50 mg daily.  10.      Ativan 0.5 mg p.r.n.  11.      Cipro 500 mg b.i.d.   DISCHARGE INSTRUCTIONS:  He was instructed not to take his aspirin, HCTZ, or  verapamil.   ACTIVITY:  As tolerated.   DIET:  He is to maintain a low-fat, low-cholesterol diet as well as maintain  his diabetic diet restrictions.   FOLLOW UP:  1. He will need repeat blood work for INR on Thursday morning, May 30, 2003.  2. He is to call Dr. Logan Bores for a followup office visit.  3. He is to see Dr.  Clarene Duke in three weeks after discharge; he is to call for     an appointment.     Adrian Saran, N.P.                        Thereasa Solo. Little, M.D.    HB/MEDQ  D:  06/03/2003  T:  06/04/2003  Job:  [id number]   cc:   Lucky Cowboy, M.D.  [address], [address]  [place], [place] [address]  Fax: [phone]   Jamison Neighbor, M.D.  [address]  [place]  [place] [address]  Fax: [phone]    cc:   Lucky Cowboy, M.D.  [address]  [place], [place] [address]  Fax: [phone]   Jamison Neighbor, M.D.  [address]  [place]  [place] [address]  Fax: [phone]

## 2011-04-30 NOTE — H&P (Signed)
NAME:  Troy Warren, Troy Warren NO.:  [id number]   MEDICAL RECORD NO.:  [id number]                   PATIENT TYPE:  OIB   LOCATION:  2895                                 FACILITY:  [facility]   PHYSICIAN:  Guadelupe Sabin, M.D.             DATE OF BIRTH:  [birth year]   DATE OF ADMISSION:  08/21/2002  DATE OF DISCHARGE:                                HISTORY & PHYSICAL   REASON FOR ADMISSION:  This was a planned outpatient surgical admission of  this 77 year old non-insulin-dependent diabetic, admitted for  cataract/implant surgery of the left eye.   PRESENT ILLNESS:  This patient has been followed in my office since 2000 for  diabetic retinopathy and progressive cataract formation.  He has undergone  previous panretinal laser photocoagulation to the left eye on three  occasions, February 05, 1999, March 18, 2000, Apr 18, 2000.  The patient was  felt to have proliferative diabetic retinopathy in the left eye and  background diabetic retinopathy, mild, in the right eye.  Progressive  cataract formation has developed, reducing his vision to 20/40 -- right eye,  20/200 -- left eye.  It was elected to proceed with cataract/implant surgery  of the left eye at this time as the patient was anticipating a driver's  license examination in December.  He was given oral discussion and printed  information concerning the procedure and possible complications; he signed  an informed consent and arrangements were made for his outpatient admission  at this time.   PAST MEDICAL HISTORY:  The patient is under the care of Dr. Marinus Maw and Dr. Thereasa Solo. Little.  He is felt to have severe coronary artery  disease with unstable angina in the past; he, however, is felt to be in  satisfactory condition for the proposed surgery.  The patient has undergone  previous coronary artery bypass surgery in July 2003.   REVIEW OF SYSTEMS:  No current cardiorespiratory complaints.   PHYSICAL EXAMINATION:  VITAL SIGNS:  As recorded on admission:  Blood  pressure 154/70, pulse 60, respirations 18, temperature 99.1.  GENERAL APPEARANCE:  The patient is a pleasant, well-nourished, well-  developed 77 year old white male in no acute distress.  HEENT:  Eyes:  The eyes are white and clear with nuclear cataract formations  in both eyes.  Visual acuity as noted above.  Applanation tonometry:  Right  eye -- 15 mm; 18 -- left eye.  Detailed fundus examination reveals a clear  vitreous behind the cataractous lens; the vitreous is clear.  The retina is  attached.  Optic nerve is sharply outlined and of good color.  Disk-cup  ratio 0.4.  The right eye shows background diabetic retinopathy with no  hemorrhage, exudation or choroidal neovascular membranes or proliferation at  this time.  The left eye shows panretinal laser photocoagulation background  -- proliferative diabetic retinopathy with focal laser photocoagulation,  panretinal type.  No  active bleeding site is seen.  CHEST:  Lungs clear to percussion and auscultation.  HEART:  Normal sinus rhythm.  No cardiomegaly.  The patient has a systemic  systolic murmur heard along the left sternal border.  ABDOMEN:  Negative.  EXTREMITIES:  Negative.   ADMISSION DIAGNOSES:  1. Senile cataracts, both eyes.  2.     Non-insulin-dependent diabetes mellitus with diabetic retinopathy,     background-proliferative type/cataract formation.   SURGICAL PLAN:  Cataract/implant surgery, left eye now, right eye later.                                               Guadelupe Sabin, M.D.    HNJ/MEDQ  D:  08/21/2002  T:  08/22/2002  Job:  [id number]

## 2011-04-30 NOTE — Op Note (Signed)
NAME:  Troy Warren, Troy Warren NO.:  [id number]   MEDICAL RECORD NO.:  [id number]                   PATIENT TYPE:  OIB   LOCATION:  2899                                 FACILITY:  [facility]   PHYSICIAN:  Darlin Priestly, M.D.             DATE OF BIRTH:  Mar 23, 1931   DATE OF PROCEDURE:  10/21/2003  DATE OF DISCHARGE:                                 OPERATIVE REPORT   PROCEDURE:  Insertion of a dual chamber pacemaker using Medtronic Enpulse  generator with passive ventricular and active atrial lead.   SURGEON:  Darlin Priestly, M.D.   COMPLICATIONS:  None.   INDICATIONS FOR PROCEDURE:  Mr. Xue is a 77 year old male patient of  Dr. Julieanne Manson and Dr. Lucky Cowboy with a history of coronary bypass  surgery in July 2003, history of paroxysmal atrial fibrillation, status post  DC cardioversion in August of 2004. The patient has continued to have  intermittent significant bradycardia and is now referred for dual chamber  pacemaker implant.   DESCRIPTION OF PROCEDURE:  After giving informed written consent, the  patient was brought to the cardiac catheterization laboratory where the  right anterior chest wall was shaved, prepped and draped in a sterile  fashion. Electrocardiogram monitoring was established. Then 1% Lidocaine was  used to anesthetize the right subclavian area.   An approximately 3-cm horizontal infraclavicular incision was then performed  and hemostasis was achieved with electrocautery. Blunt dissection was then  used to carry this  down to the right pectoral fascia. Following  this the  right subclavian vein was then entered without complication using  fluoroscopic guidance and a guide wire was then easily passed into the  superior vena cava and right atrium.   Next a #9 [place] dilator and sheath was then advanced  over the guide wire  and the dilator and guide wire were then removed. Following  this a 52-cm  Medtronic passive  lead, model [id number], serial number [id number] was then  easily passed into the right atrium and the retained guide wire was then  repositioned through the sheath. The peel-away sheath was then removed. A  2nd 9 French dilator and sheath was then passed over the retained guide wire  and the guide wire and dilator were then removed.   An active fixation Medtronic 45 cm lead, model #5076 serial number  [id number] was then passed through the sheath and then positioned in the  right atrium without difficulty. The retained guide wire was then  repositioned and the peel-away sheath was then removed. The retained guide  wire was then anchored to the sheath with a hemostat.   A J-curve was then placed on the ventricular stylet and the ventricular lead  was then easily brought up to the tricuspid valve and positioned in the RV  apex without difficulty. R-waves were measured at 6.7 millivolts. Impedance  was stable at 662 ohms. The threshold  of the ventricle was 0.5 volts at 0.5  milliseconds. Current was 0.9 milliamps, 10 volts were negative.   A J-stylet was then placed in the atrial lead and the right atrial lead was  then positioned in the neighborhood of the right atrial appendage. The screw  was then extended and confirmed. Thresholds were then determined. P-waves  were stable at 2.7 millivolts. Impedance was stable at 578 ohms. The  threshold in the atrium was 1.1 volts at 0.5 milliseconds. Current was 2.4  milliamps, 10 volts were negative. These leads were then  sutured into place  with 2 silk sutures per lead.   The pocket was then copious irrigated with 1% kanamycin solution. A single  silk anchoring suture was then placed at the apex of the pocket and the  generator and leads were then  delivered into the pocket and the anchoring  suture was then secured to the header. The  pocket was then closed using  running 2-0 Dexon for the subcutaneous layer and running 5-0 Dexon for the  skin   layer. Steri-Strips were then applied. The patient was then  transferred to the recovery room in stable condition.   CONCLUSIONS:  Successful placement of a Medtronic Enpulse E2DRO1 serial  number [id number] generator with passive ventricular and active leads.                                               Darlin Priestly, M.D.    RHM/MEDQ  D:  10/21/2003  T:  10/21/2003  Job:  [id number]   cc:   Thereasa Solo. Little, M.D.  [address]  [place] [address]  Fax: [phone]   Lucky Cowboy, M.D.  [address]  [place], [place] [address]  Fax: [phone]

## 2011-04-30 NOTE — Discharge Summary (Signed)
NAME:  FOWLER, ANTOS NO.:  [id number]   MEDICAL RECORD NO.:  [id number]          PATIENT TYPE:  INP   LOCATION:  3033                         FACILITY:  [facility]   PHYSICIAN:  Casimiro Needle L. Reynolds, M.D.DATE OF BIRTH:  Jul 15, 1931   DATE OF ADMISSION:  10/26/2006  DATE OF DISCHARGE:  11/03/2006                                 DISCHARGE SUMMARY   DIAGNOSES AT TIME OF DISCHARGE:  1. Left temporal intracranial hemorrhage secondary to Coumadin for atrial      fibrillation.  2. Atrial fibrillation.  3. Hypertension.  4. Diabetes.  5. Coronary artery disease.  Had a CABG in 2004 and an admission for      angina with negative catheterization in May 2007.  6. Pacemaker secondary to bradycardia.  7. 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  Biopsies were negative for prostate      cancer.   MEDICINES AT TIME OF DISCHARGE:  1. Amiodarone 200 mg daily.  2. Coreg 25 mg b.i.d.  3. Diovan 320 mg daily.  4. Lasix 40 mg b.i.d.  5. Zoloft 100 mg daily.  6. Potassium 10 mEq q.i.d.  7. Synthroid 75 mcg daily.  8. Sular 10 mg daily.  9. Pravachol 40 mg daily.  10.Imdur 30 mg daily.  11.Lisinopril 5 mg  Dictation ended at this point.      Annie Main, N.P.      Marolyn Hammock. Thad Ranger, M.D.  Electronically Signed   SB/MEDQ  D:  11/02/2006  T:  11/02/2006  Job:  [id number]   cc:   Pramod P. Pearlean Brownie, MD  Lucky Cowboy, M.D.  Thereasa Solo. Little, M.D.

## 2011-04-30 NOTE — H&P (Signed)
NAME:  Troy Warren, SITES NO.:  [id number]   MEDICAL RECORD NO.:  [id number]          PATIENT TYPE:  OIB   LOCATION:  2899                         FACILITY:  [facility]   PHYSICIAN:  Guadelupe Sabin, M.D.DATE OF BIRTH:  1931-08-01   DATE OF ADMISSION:  01/26/2005  DATE OF DISCHARGE:  01/26/2005                                HISTORY & PHYSICAL   This was a planned outpatient admission of this 77 year old white male  admitted for cataract implant surgery of the right eye.   PRESENT ILLNESS:  This patient has been followed in my office, since  January 22, 1999, for evaluation of diabetic retinopathy of both eyes.  Initial examination revealed a history of insulin-dependent diabetes  mellitus and diabetic retinopathy.  Over the ensuing years, the patient has  had laser photocoagulation to both eyes and has been admitted previously on  September 18, 2002 for posterior vitrectomy surgery and endolaser  photocoagulation of the left eye. The patient has recently had focal laser  photocoagulation to the right eye.  It has also been noted that the patient  has had progressive cataract formation in both eyes.  He has undergone  previous cataract implant surgery of the left eye, on August 21, 2002, and  is now admitted for similar surgery of the right eye.  Current vision is  recorded at 20/80+/- right eye, 20/200 left eye.  The patient has been given  oral discussion and printed information concerning the operative procedure  and possible complications.  He has signed an informed consent and  arrangements made for his outpatient admission at this time.   PAST MEDICAL HISTORY:  The patient continues under the care of Dr. Oneta Rack  for his diabetes mellitus.   He takes multiple medications for his hypertension, arteriosclerotic heart  disease with coronary artery bypass surgery March 17, 1999, hyperlipidemia,  and some and benign prostatic hypertrophy.   The patient is felt to be  an average risk for local anesthesia for this  procedure by Dr. Oneta Rack.   REVIEW OF SYSTEMS:  No current cardiorespiratory complaints.   PHYSICAL EXAMINATION:  VITAL SIGNS:  As recorded on admission, blood  pressure 130/69, pulse 87, respirations 18, temperature 97.1. GENERAL  APPEARANCE:  The patient is a pleasant well-nourished, well-developed white  male in no acute distress. HEENT:  EYES:  The visual acuity as recorded  above.  Slit lamp examination, the cornea is clear, anterior chamber deep  and clear, nuclear cataract formation is present right eye, posterior  chamber implant left eye.  Dilated fundus examination shows an optic nerve  sharply outlined, good color with background diabetic retinopathy, minimal  in the right eye with a few para macular laser photocoagulation scars.  The  left eye shows a clear vitreous with extensive panretinal laser  photocoagulation.  The optic nerve is pale in the left eye and the blood  vessels attenuated.  No active bleeding is present  CHEST/LUNGS:  Clear to  percussion and auscultation.  HEART:  Normal sinus rhythm.  No cardiomegaly.  No murmurs.  ABDOMEN:  Negative.  EXTREMITIES:  Negative.  ADMISSION DIAGNOSIS:  1.  Senile nuclear cataract, right eye.  2.  Pseudophakia, left eye.  3.  Diabetes mellitus.  4.  Diabetic retinopathy.   SURGICAL PLAN:  Cataract implant surgery, right eye.      HNJ/MEDQ  D:  04/10/2005  T:  04/11/2005  Job:  [id number]

## 2011-04-30 NOTE — Op Note (Signed)
NAME:  Troy Warren, Troy Warren NO.:  [id number]   MEDICAL RECORD NO.:  [id number]          PATIENT TYPE:  INP   LOCATION:  1417                         FACILITY:  [facility]   PHYSICIAN:  Jamison Neighbor, M.D.  DATE OF BIRTH:  17-Aug-1931   DATE OF PROCEDURE:  03/31/2006  DATE OF DISCHARGE:                                 OPERATIVE REPORT   PREOPERATIVE DIAGNOSIS:  1.  Benign prostatic hypertrophy with bladder outlet obstruction and      associated urinary retention.  2.  History of liposarcoma.   PROCEDURE:  1.  Suprapubic prostatectomy.  2.  Right extended pelvic lymph node dissection.   SURGEON:  Jamison Neighbor, M.D.   ASSISTANT:  Cornelious Bryant, M.D.   ANESTHESIA:  General.   COMPLICATIONS:  None.   DRAINS:  A 24 [place] Ainsworth Foley and also a 20 [place] Foley catheter and  suprapubic tube and a Blake drain.   HISTORY:  This 77 year old male developed swelling in the right hemiscrotum  and underwent an exploration for what was presumed to be a hydrocele with a  possible extension up into the cord.  He was found to have a mass in that  area was removed, and this turned out to be a liposarcoma.  Because the  margins were positive the patient was taken back, where he underwent groin  exploration, including removal of the entire cord up to the inguinal ring.  He had the scrotum resected and had the entire area removed en bloc with all  soft tissue taken down to the bone.  The patient recovered from this quite  nicely and had follow-up scans that showed a few fatty lymph nodes but was  otherwise unremarkable.  There was no sign of any adenopathy within the  groin.  The patient subsequently developed an elevated PSA and problems with  his bladder outlet.  He underwent ultrasound testing, which showed a  prostate of 120 g.  He underwent biopsy, which turned to be negative for  cancer, but the patient went into urinary retention.  He has failed voiding  trials  despite the use of medication such as Flomax and is now to undergo an  open prostatectomy.  This was selected for two reasons.  The first is  because of the large size of the prostate, which would be very difficult to  resect in a single setting.  It was not felt that he was a particularly good  candidate for minimally-invasive surgery, either.  The second rationale was  that it would allow [place] to give access to the pelvic area for a node  dissection given the abnormal nodes seen on CT.  The patient understands  that these are two separate problems that are going to be dealt with in a  single setting.  He understands the risks and benefits of the procedure.  He  is aware of the fact that if the nodes are positive, we will refer him for  oncologic evaluation to determine if he needs either radiation or  chemotherapy.  He also knows that he will have a fairly prolonged  hospital  stay following the open prostatectomy and may need transfusions.  He is also  aware of the possibility for erectile dysfunction as well as urinary  incontinence.  He gave full and informed consent.   PROCEDURE:  After successful induction of general anesthesia, the patient is  placed in the supine position, prepped with Betadine and draped in the usual  sterile fashion.  A midline incision was made.  This was carried down  through the subcutaneous tissue until the fascia was identified.  This was  opened in the midline all the way down to the symphysis pubis.  The patient  was found to have a little herniation on the right-hand side.  As that was  taken down, there was a defect in the peritoneum.  This was fixed at the end  of the procedure with a running suture of 3-0 Vicryl.  The Bookwalter  retractor was placed and inspection of the right pelvis showed extensive  adenopathy.  The patient underwent a very extensive lymph node dissection.  The external iliac was cleared off all the way up to the bifurcation of the   common iliac.  The dissection even included some nodal tissue back behind  the iliac artery.  The obturator fossa was also cleared, and the entire area  overlying the external iliac vein and obturator node was cleared.  Hemostasis was obtained where needed with surgical clips.  Approximately 10  or more large lymph nodes were sent down for frozen section.  Dr. Jimmy Picket looked at these felt initially there was no sign of any cancer, but  we reminded him that this patient had a history of liposarcoma.  He noted  these were very fatty in nature and he is going to proceed to violate these  with permanent section.  Given the large size of the nodes, it may be  multiple days until the final report on this is available.  The lymph nodes  were looked at carefully.  There was no need for a node dissection since the  patient had only had unilateral tumor.   Attention was then directed to the suprapubic prostatectomy.  The bladder  was then opened between two stay stitches.  Electrocautery was used to make  a scored opening in the mucosa below the ureters.  The operator's finger was  placed down between the adenoma and the normal prostate all the way down  toward the sphincter.  The resection then proceeded superiorly to free up  the adenoma from the surrounding tissue.  When this had been freed  circumferentially, it was then removed by pinching off the urethra and  extracting the adenoma.  The patient had hemostatic stitches placed at 5  o'clock and 7 o'clock, which did nicely flatten down the mucosa.  The  patient had a 71 [place] Ainsworth Foley catheter inserted, and Gelfoam was  placed down within the prostatic fossa.  The patient then closed with a  double running layer of 2-0 Vicryl.  A suprapubic tube was placed and  brought out through a stab incision in the left side.  The patient was  irrigated and both catheters irrigated normally.  The incision was then closed with a running suture  of #1 PDS.  Prior to closure the opening in the  peritoneum on the left was closed with 3-0 Vicryl.  Careful inspection in  the right hemipelvis showed no bleeding from the operative site.  The  patient did have a Blake drain in  place, which was inserted down along the  bladder as well as in the pelvis.  This was sutured in place with a 2-0  silk, as was the suprapubic tube.  The skin was closed with surgical  staples.  The patient tolerated the procedure well and was taken to the  recovery room in good condition.           ______________________________  Jamison Neighbor, M.D.  Electronically Signed     RJE/MEDQ  D:  03/31/2006  T:  04/01/2006  Job:  [id number]   cc:   Thereasa Solo. Little, M.D.  Fax: [phone]   Lucky Cowboy, M.D.  Fax: [phone]

## 2011-04-30 NOTE — Op Note (Signed)
NAME:  Troy Warren, CHAPEL NO.:  [id number]   MEDICAL RECORD NO.:  [id number]          PATIENT TYPE:  INP   LOCATION:  0004                         FACILITY:  [facility]   PHYSICIAN:  Jamison Neighbor, M.D.  DATE OF BIRTH:  [birth year]   DATE OF PROCEDURE:  05/24/2006  DATE OF DISCHARGE:                                 OPERATIVE REPORT   PREOPERATIVE DIAGNOSIS:  History of suprapubic prostatectomy with urine leak  and urinoma, possible wound infection.   POSTOPERATIVE DIAGNOSIS:  Urine leak, resolved.   PROCEDURES PERFORMED:  1.  Cystoscopy.  2.  Cystogram.  3.  Interoperative fluoroscopy with interpretation   ATTENDING SURGEON:  Jamison Neighbor, M.D.   ASSISTANT SURGEON:  Terie Purser, M.D.   ANESTHESIA:  General endotracheal.   COMPLICATIONS:  None.   DRAINS:  None.   DISPOSITION:  Stable to the post anesthesia care unit.   INDICATIONS FOR PROCEDURE:  [name] is a 77 year old gentleman who had  a history of BPH and underwent suprapubic prostatectomy in April.  He  subsequently presented to clinic with a large urinoma which was drained.  He  has had a Foley catheter drainage since.  He had a cystogram performed in  the office which was showed a questionable persistent bladder leak.  He also  had some redness surrounding his surgical incision with concern for possible  wound infection.  He was subsequently counseled regarding the need to take  him to the operating room for evaluation including cystoscopy and possible  open repair of urine leak.  The benefits and risks were explained and  consent was obtained.   DESCRIPTION OF PROCEDURE:  The patient was brought to the operating room and  properly identified.  A time-out was performed to confirm correct patient  and procedure.  He was administered general anesthesia, given preoperative  antibiotics, placed in the dorsal lithotomy position, and prepped and draped  in a sterile fashion.  We then performed  cystoscopy with 70 degree lens 22-  French cystoscope sheath.  The anterior urethra was normal.  The posterior  urethra demonstrated post surgical changes.  His bladder was trabeculated  and there was some bullous edema of the mucosa consistent with the patient's  Foley catheter.  We did not identify any opening or defect in the bladder.  As a result, we elected to perform cystogram at this time.   The cystoscope was removed and a Foley catheter was placed in the bladder  without difficulty.  Then, using a gravity drainage, approximately 400 mL of  Cysto-Conray was instilled into the bladder.  Multiple fluoroscopic images  were obtained.  These images showed the bladder filling appropriately with  also evidence of contrast in the prostatic fossa.  This did not appear to  leak during this aspect of the procedure.  There was no evidence of  extravasation.  The bladder was then allowed to drain and image was taken of  the bladder after drainage and revealed no persistent contrast indicating  resolution of prior leak.  We decided to then remove the patient's Foley  catheter.  We  examined the patient's wound and there was no evidence of  active infection.  The procedure was then terminated.  The patient was  awakened from anesthesia and transported to the recovery room in stable  condition.  There were no complications.  Please note Dr. Logan Bores was present  and participated in all aspects of the procedure as the primary surgeon.     ______________________________  Terie Purser, MD      Jamison Neighbor, M.D.  Electronically Signed    JH/MEDQ  D:  05/24/2006  T:  05/24/2006  Job:  [id number]

## 2011-04-30 NOTE — Cardiovascular Report (Signed)
Troy Warren. [facility]  Patient:    Troy Warren, Troy Warren Visit Number: [id number] MRN: [id number]          Service Type: MED Location: 2300 [phone] Attending Physician:  Mikey Bussing Dictated by:   Thereasa Solo Little, M.D. Admit Date:  06/22/2002   CC:         Cath Lab  Deeann Cree, M.D.  CVTS   Cardiac Catheterization  DATE OF BIRTH:  05-11-1931  INDICATIONS FOR TEST:  Troy Warren is a 77 year old male who is hypertensive and has adult onset diabetes.  He has mild LV dysfunction and aortic insufficiency by echocardiogram.  He had developed an episode of chest discomfort that he describes as an indigestion that was not associated with eating that lasted about 5-10 minutes and was significantly effected by this. It resolved spontaneously about a week ago and he had a second episode that was mild while he was cutting grass.  He has had no recurrent problems now in at least a week.  He is brought in for outpatient right and left heart catheterization.  PROCEDURE:  The patient was prepped and draped in the usual sterile fashion exposing the right groin.  Following local anesthetic of 1% Xylocaine, the Seldinger technique was employed and a 5-French introducer sheath was placed in the right femoral artery, and an 8-French introducer sheath into the right femoral vein.  A Swan-Ganz catheter was advanced through its normal route into the pulmonary artery with hemodynamic monitoring undertaken throughout each station.  Cardiac output by thermodilution was performed.  Simultaneous saturations in the pulmonary artery and ascending aorta were obtained. Ventriculography, aortic root injections, selective left and right coronary arteriography was performed.  COMPLICATIONS:  None.  EQUIPMENT:  A 5-French Judkins configuration catheters, 8-French Swan-Ganz catheters.  HEMODYNAMIC MONITORING: 1. Right atrial pressure 11, right ventricular pressure  47/9, pulmonary artery    pressure 42/22, wedge 19.  Aortic pressure 188/82.  Left ventricular    pressure 196/11.  At the time of pullback, there was no aortic valve    gradient seen. 2. Saturations: Aorta 91%, pulmonary artery 60%, cardiac index by    thermodilution 7.8 L/minute, cardiac index 3.9.  Thick cardiac output    questionable accuracy.  Cardiac output 3.5, cardiac index 1.8.  VENTRICULOGRAPHY: 1. Ventriculography in the RAO projection using 25 cc of contrast revealed    good opacification of the left ventricle.  The left ventricular cavity    was dilated and there was mild left ventricular hypertrophy.  The    ejection fraction was 55%, the end diastolic pressure was 11.  There    was no mitral regurgitation. 2. Aortic root: The aortic root revealed injection at 30 cc of contrast at    15 cc/second revealed mild aortic insufficiency.  The aortic root was only    minimally dilated. 3. Coronary arteriography: The left main was either very short or a common    ostium.  I could not selectively engage the circumflex making me think it    was a short left main.  ANGIOGRAPHIC DATA: 1. LAD: The LAD had calcification in the proximal segment.  In this proximal    segment was an eccentric 60% plus area of narrowing.  The mid portion had a    90% area of narrowing and the distal vessel was free of disease.  The    ostium of the first diagonal had a 60% area narrowing. 2. Circumflex: The circumflex  was a dominant vessel giving rise to three OMs    and the PDA.  The first OM had ostial 60-70% narrowing with the distal OMs    and PDA being free of disease. 3. Ramus: The ramus had an 80% area of narrowing in the mid portion of the    vessel and the distal vessel bifurcated. 4. Right coronary artery: Nondominant vessel.  CONCLUSION: 1. Mild dilatation of the left ventricle with left ventricular hypertrophy    and slightly decreased ejection fraction at 55%. 2. Mild aortic  insufficiency. 3. Mild pulmonary artery hypertension with pulmonary artery pressure of 42/22. 4. Significant disease in the LAD, ostium of the first diagonal, in the ramus,    and the ostium of the first OM.  Because of the diffuseness of disease, I have asked CVTS to evaluate the patient for consideration of revascularization.  Of note, he has had problems with bradycardia in the past.  He had been taking Verapamil which has now been stopped.  Cannot use beta-blockers at this point because of the bradycardia. Dictated by:   Thereasa Solo Little, M.D. Attending Physician:  Mikey Bussing DD:  06/22/02 TD:  06/25/02 Job: [id number]

## 2011-04-30 NOTE — Op Note (Signed)
NAME:  Troy Warren, Troy Warren NO.:  [id number]   MEDICAL RECORD NO.:  [id number]          PATIENT TYPE:  AMB   LOCATION:  DAY                          FACILITY:  [facility]   PHYSICIAN:  Jamison Neighbor, M.D.  DATE OF BIRTH:  10-Oct-1931   DATE OF PROCEDURE:  DATE OF DISCHARGE:                                 OPERATIVE REPORT   PREOPERATIVE DIAGNOSES:  1.  Right hydrocele.  2.  Chronic right scrotal pain.   POSTOPERATIVE DIAGNOSIS:  Right chronic epididymitis.   PROCEDURE:  1.  Scrotal exploration.  2.  Right simple orchiectomy .   SURGEON:  Jamison Neighbor, MD.   ASSISTANT:  Glade Nurse, MD.   ANESTHESIA:  General endotracheal.   SPECIMENS:  Right testicle and cord.   PROCEDURE:  The patient was identified by his wrist bracelet and brought to  room 12 where he received preprocedure antibiotics and was administered  general anesthesia. He was prepped and draped in the usual sterile fashion  taking care to minimize peripheral neuropathy or compartment syndrome. Next,  we made a 4 cm incision over the midline raphe, we carried it down through  the dartos to the level of the tunica vaginalis with the knife blade. Next,  we delivered the right testicle and cord into the field. The tunica  vaginalis was opened and inverted exposing the testicle and cord. The entire  epididymis was markedly enlarged and very firm to palpation _consistent with  chronic epididymitis. The cord structure was markedly thickened. Because of  this, a decision was made to perform a right simple orchiectomy. Next, we  opened the fibers of the cremaster and isolated the vas and vessels. They  were double ligated with a 2-0 silk tie and a 2-0 silk suture tie and  divided sharply. This left the remainder of the cord structure which was  dissected out sharply. We did not find any hernia sac. The remainder of this  structure was divided with Bovie cautery. Next, the specimen was passed onto  the  back table. We inspected the operative field which was hemostatic. We  then closed the scrotum in three layers. We closed the dartos fascia in two  layers closing a deep layer of running 2-0 chromic and more superficial  layer of dartos was closed with 3-0 chromic. The skin was  closed with a running 3-0 chromic suture. Dressings were applied. The  patient was reversed from his anesthesia which he tolerated without  complications. Please note Dr. Marcelyn Bruins was present and participated in  all aspects of this case.     ______________________________  Glade Nurse, MD      Jamison Neighbor, M.D.  Electronically Signed    MT/MEDQ  D:  10/07/2005  T:  10/07/2005  Job:  [id number]

## 2011-04-30 NOTE — H&P (Signed)
NAME:  Troy Warren, Troy Warren                       ACCOUNT NO.:  [id number]   MEDICAL RECORD NO.:  [id number]                   PATIENT TYPE:  OIB   LOCATION:  2895                                 FACILITY:  [facility]   PHYSICIAN:  Guadelupe Sabin, M.D.             DATE OF BIRTH:  09-Jun-1931   DATE OF ADMISSION:  08/21/2002  DATE OF DISCHARGE:  08/21/2002                                HISTORY & PHYSICAL   REASON FOR ADMISSION:  This was a planned outpatient readmission of this 77-  year-old white male admitted for retained lens fragments and diabetic  retinopathy of the right eye.   HISTORY OF PRESENT ILLNESS:  This patient has a long history of noninsulin  dependent diabetes mellitus and secondary complications of diabetic  retinopathy and cataract formation. He has had previous laser  photocoagulation performed to the left eye on three occasions, panretinal  type. Due to progressive cataract formation, the patient was recently  admitted on 08/21/02 for planned extracapsular cataract extraction with  primary insertion of a posterior chamber intraocular lens implant. At  surgery, there appeared to be a break in the posterior capsule but enough  retained lens capsule to allow the placement of a posterior chamber  intraocular lens implant. There was no associated vitreous loss. A  peripheral iridectomy was performed also at the time of surgery.  Postoperatively, the patient has done well, but has been noted to have some  small lens fragments in the pupillary aperture reducing his vision and  causing him to have the sensation of multiple floaters. It was therefore  elected to perform a posterior vitrectomy to remove the retained cataract  lens fragments and also inspect the retina for any possible diabetic  retinopathy progression. The patient was given oral discussion and printed  information concerning the procedure and its complications, he signed an  informed consent and arrangements  made for his outpatient readmission.   PAST MEDICAL HISTORY:  The patient is in stable general health; see old  chart. The patient is felt to be in satisfactory condition for the proposed  surgery. The patient has had coronary artery bypass surgery in July 2003. He  is under the cardiac care of Dr. Caprice Kluver.   ALLERGIES:  No known allergies.   REVIEW OF SYMPTOMS:  No current cardiorespiratory complaint. The patient  denies any chest pain, shortness of breath or ankle swelling.   PHYSICAL EXAMINATION:  VITAL SIGNS:  As recorded on admission - heart rate  56, temperature 97.2, respirations 16, blood pressure 205/91.  GENERAL APPEARANCE:  The patient is a pleasant, well-nourished, well-  developed white male in no acute distress.  HEENT:  Eyes:  Visual acuity without correction 20/70 +2 right eye, 20/200  left eye. External ocular and slit lamp examination - There is resolving  operative reaction following the previous cataract surgery of the left eye.  The cornea is clear, anterior chamber deep and  clear. The peripheral  iridectomy is open at the 12:30 position, a well centered posterior chamber  intraocular lens implant is present. Behind the implant, however, is some  white cortical cataract fragments in the pupillary aperture and at the  posterior open capsule. There is no retained lens fragments in the  midvitreous or on the retinal surface. Detailed fundus examination reveals  panretinal laser photocoagulation. The optic nerve is sharply outlined,  slightly pale in color, disk to cup ratio 0.4. Panretinal laser  photocoagulation is present. There are several areas of microaneurysm and  dot hemorrhage is present. The macular appears glassy.  LUNGS:  Lungs clear to percussion and auscultation.  CARDIAC:  Normal sinus rhythm, no cardiomegaly, no murmurs.  ABDOMEN:  Negative.  EXTREMITIES:  Negative.   ADMISSION DIAGNOSES:  1.Retained cataract fragments following cataract  implant  surgery left eye.  1. Proliferative diabetic retinopathy left eye.  2. Status post panretinal laser photocoagulation left eye.   SURGICAL PLAN:  Posterior vitrectomy through pars plana using vitreous  infusion suction cutter, removal of retained cataract fragments, endolaser  photocoagulation, preretinal membrane peeling if needed.                                                Guadelupe Sabin, M.D.    HNJ/MEDQ  D:  09/18/2002  T:  09/20/2002  Job:  [id number]   cc:   Include lab data, EKG, chest x-ray Julieanne Manson, M.D.

## 2011-04-30 NOTE — Discharge Summary (Signed)
NAME:  Troy, Warren                         ACCOUNT NO.:  [id number]   MEDICAL RECORD NO.:  [id number]                   PATIENT TYPE:  OIB   LOCATION:  4735                                 FACILITY:  [facility]   PHYSICIAN:  Thereasa Solo. Little, M.D.              DATE OF BIRTH:  1931/08/13   DATE OF ADMISSION:  10/21/2003  DATE OF DISCHARGE:  10/22/2003                                 DISCHARGE SUMMARY   HISTORY OF PRESENT ILLNESS:  [name] came in the hospital in the  Same Day area for outpatient elective procedure to have a pacemaker  inserted.  He is a 77 year old white married male patient of Gaspar Garbe B.  Little, M.D.  He has a history of PAF, sick sinus syndrome.  He had a  cardioversion on August 23 which he maintained sinus rhythm.  However, he  continued to have a pulse ranging from 33 to 42 with symptoms of marked  fatigue.  He was on amiodarone 200 mg daily and no native chronotropic  medication.  Thus, it was decided that he would need a pacemaker.  He also  has a history of coronary artery disease, hypertension.  He is on  anticoagulation.  He has left ventricular dysfunction with an EF of 30-35%  by echocardiogram and 40% by catheterization.  On October 21, 2003 he had a  Medtronic EnPulse generator with passive ventricular and active atrial lead  placed.  Serial [id number].  It was inserted via right subclavian.  He had  two Medtronic leads placed.  Pacemaker was interrogated on October 22, 2003.  For threshold atrial was 0.5 volts at 0.4 MS.  Ventricular was 0.5 volts at  0.4 MS.  Impedance was 496 atrial, 599 ventricle.  PR-wave was 2.8-4.0  atrial and ventricular was 8.0-11.2.  He was A-paced 99% and V-paced less  than 0.1%.  On the morning of October 22, 2003 he was seen by Gaspar Garbe B.  Little, M.D.  He reviewed his medications.  His blood pressure was quite  high.  It was decided to titrate his blood pressure medications.  Thus, a  beta blocker was added  Toprol 50 mg daily.  His blood pressure was 180/77,  160/78.  Pulse was 60, respirations 20, temperature 98.2, room air  saturations 94%.  No laboratories were done.  The chest x-ray was reviewed  by Gaspar Garbe B. Little, M.D.  There was no pneumothorax.  Leads were in their  proper place.  He was told to start his Coumadin in 48 hours.   DISCHARGE MEDICATIONS:  1. Toprol XL 50 mg daily.  2. Amiodarone 200 mg daily.  3. Paxil 30 mg daily.  4. Norvasc 10 mg daily.  5. Glyburide 10 mg b.i.d.  6. Diltiazem 240 mg daily.  7. Lescol 40 mg q.h.s.  8. Diovan 320 mg daily.  9. Coumadin.  He should start on a Wednesday night.  10.  Alprazolam half a pill when needed every day.   DISCHARGE INSTRUCTIONS:  He should do no driving for one week.  He should  not go to work until he is seen by Gaspar Garbe B. Little, M.D.  He should do no  stretching, pulling, pushing, lifting with his right arm.  He may use his  right arm, but no over extension or extension.  He should keep the area  clean and dry for four days and he may shower and wash with soap and water  and pat it dry.  If his pacer site shows any signs of swelling, oozing,  bruising he will give [place] a call.  He will follow up with Gaspar Garbe B. Little,  M.D. on November 19 at 4:15.  Will follow up with Darlin Priestly, M.D.  for pacemaker check in three months which will be in February.   DISCHARGE DIAGNOSES:  1. Sick sinus syndrome, marked bradycardia with marked fatigue also with     history of paroxysmal atrial fibrillation.  2. Status post PTVDP October 21, 2003 by Darlin Priestly, M.D. with a     Medtronic EnPulse.  3. Hypertension, now controlled.  Toprol XL added.  4. Non-insulin-dependent diabetes mellitus.  5. Anticoagulation.  He will start his Coumadin in 48 hours.  6. Dyslipidemia.  7. LVD with an ejection fraction of 30-35% by echocardiogram, 40% by     catheterization.  8. History of benign prostatic hypertrophy.  He is now on  Proscar and will     need to have a reevaluation by Troy Warren, M.D. in January per     patient report.      Lezlie Octave, N.P.                        Thereasa Solo. Little, M.D.    BB/MEDQ  D:  10/22/2003  T:  10/22/2003  Job:  [id number]   cc:   Lucky Cowboy, M.D.  [address]  [place], [place] [address]  Fax: [phone]   Troy Warren, M.D.  [address]  [place]  [place] [address]  Fax: [phone]

## 2011-04-30 NOTE — Op Note (Signed)
[place]  Patient:    Troy Warren, PUSKAS Visit Number: [id number] MRN: [id number]          Service Type: MED Location: 2000 2001 01 Attending Physician:  Mikey Bussing Dictated by:   Mikey Bussing, M.D. Proc. Date: 06/26/02 Admit Date:  06/22/2002   CC:         CVTS office  Julieanne Manson, M.D.   Operative Report  OPERATION:  Coronary artery bypass grafting x2 (left internal mammary artery to the anterolateral, saphenous vein graft to ramus intermedius).  PREOPERATIVE DIAGNOSIS:  Class IV angina with severe two vessel coronary artery disease.  POSTOPERATIVE DIAGNOSIS:  Class IV angina with severe two vessel coronary artery disease.  SURGEON:  Mikey Bussing, M.D.  ASSISTANT:  Adair Patter, P.A.-C.  ANESTHESIA:  General by Dr. Halford Decamp.  INDICATIONS FOR PROCEDURE:  The patient is a 77 year old white male type 2 diabetic who presented with symptoms of exertional then resting angina. Cardiac catheterization by Dr. Caprice Kluver demonstrated high grade stenosis of the left anterior descending artery, very proximally, as well as high grade stenosis of a smaller ramus intermediate.  The patient was felt to be a candidate for surgical coronary revascularization.  Prior to surgery I examined the patient in his hospital room and reviewed the results of the cardiac catheterization with the patient and family.  I discussed the indications and expected benefits of coronary artery bypass grafting surgery for treatment of his coronary artery disease, including relief of symptoms of angina, preservation of ventricular function, and improved survival.  I discussed the major aspects of the proposed operation with the patient and the family, including the choice of conduits for grafting, the use of general anesthesia, and cardiopulmonary bypass, the location of the surgical incisions, and the expected postoperative hospital  recovery period.  I discussed with the patient the risks to him of coronary artery bypass graft surgery, including those risks of myocardial infarction, cerebrovascular accident, bleeding, infection, blood transfusion requirement, and death.  He understood these implications for the surgery as well as the alternatives to surgery, and agreed to proceed with operation under what I thought was an informed consent.  OPERATIVE FINDINGS:  The patients heart is enlarged with his history of hypertension and covered in a deep layer of epicardial fat.  The coronaries were difficult to identify.  The mammary artery was somewhat small, but had good flow.  The saphenous vein was of adequate quality.  DESCRIPTION OF PROCEDURE:  The patient was brought to the operating room and placed supine on the operating room table where general anesthesia was induced under invasive hemodynamic monitoring.  The chest, abdomen, and legs were prepped with Betadine and draped as a sterile field.  A sternal incision was made, and the internal mammary artery was harvested, as the saphenous vein was harvested from the right lower leg.  The sternal retractor was placed. Heparin was administered.  After the ECT was documented as being therapeutic, the patient was placed on bypass through cannulas placed in the ascending aorta and right atrium, and cooled to 32 degrees.  The coronaries were identified for grafting.  The mammary artery and vein graft were prepared for the distal anastomoses.  A Cardioplegia cannula was placed for both antegrade and retrograde delivery of cold blood Cardioplegia, as the patient had a trace of aortic insufficiency on his preoperative catheterization and echocardiogram.  The patient was cooled to 75 degrees.  The aortic  cross clamp was applied, 500 of cold blood Cardioplegia was delivered in split doses between the antegrade aortic and retrograde coronary sinus Cardioplegia catheters.  There  was good cardioplegic arrest with septal temperature dropping less than 15 degrees.  Topical iced saline slush were used to augment myocardial preservation, and a pericardial insulator pad was used to protect the left phrenic nerve.  The distal coronary anastomoses were then performed.  The first distal anastomosis was placed to the ramus intermedius.  This was a 1.5 mm vessel, proximal 90% stenosis.  A reverse saphenous vein was sewn end-to-side with a running 7-0 Prolene with good flow through the graft.  The second distal anastomosis was the anterolateral branch of the mid portion of the left anterior descending artery.  This branch was a bit larger than the left anterior descending artery, which was deeply intramyocardial, and underneath a thick layer of fat.  The left internal mammary artery pedicle was brought through an opening created in the left lateral pericardium, was brought down on the anterolateral and sewn end-to-side with a running 8-0 Prolene.  There was good flow through the anastomosis with immediate rise in septal temperature and the heart began to beat spontaneously.  The pedicle was secured to the epicardium, and the aortic cross clamp was removed.  A partial occluding clamp was placed on the ascending aorta, and one proximal vein anastomosis was performed.  The partial clamp was removed and the vein was perfused.  There was good flow, and hemostasis was documented at the proximal and distal sites.  The patient was rewarmed to 37 degrees.  Temporary pacing wires were applied.  The lungs were re-expanded, and the ventilator was resumed.  The patient was weaned from bypass on no pressors with stable cardiac output and in a sinus rhythm.  Protamine was administered without adverse reaction.  The cannulas were removed.  The mediastinum was irrigated with warm antibiotic irrigation.  The leg incision was irrigated and closed in  a standard fashion.  The pericardium was  loosely reapproximated.  Two mediastinal and a left pleural chest tube were placed, and brought out through separate incisions.  The sternum was reapproximated with interrupted steel wire.  The pectoralis fascia was closed with interrupted Vicryl.  The subcutaneous and skin were closed with a running Vicryl.  Sterile dressings were applied.  The total bypass time was 100 minutes with aortic cross clamp time of 45 minutes. Dictated by:   Mikey Bussing, M.D. Attending Physician:  Mikey Bussing DD:  06/26/02 TD:  06/29/02 Job: [id number]

## 2011-06-29 ENCOUNTER — Other Ambulatory Visit (HOSPITAL_COMMUNITY): Payer: Self-pay | Admitting: Internal Medicine

## 2011-06-29 ENCOUNTER — Ambulatory Visit (HOSPITAL_COMMUNITY)
Admission: RE | Admit: 2011-06-29 | Discharge: 2011-06-29 | Disposition: A | Discharge status: Home / Self Care | Payer: Medicare Other | Source: Ambulatory Visit | Attending: Internal Medicine | Admitting: Internal Medicine

## 2011-06-29 DIAGNOSIS — R0602 Shortness of breath: Secondary | ICD-10-CM | POA: Insufficient documentation

## 2011-06-29 DIAGNOSIS — Z950 Presence of cardiac pacemaker: Secondary | ICD-10-CM | POA: Insufficient documentation

## 2011-07-14 ENCOUNTER — Ambulatory Visit
Admission: RE | Admit: 2011-07-14 | Discharge: 2011-07-14 | Disposition: A | Discharge status: Home / Self Care | Payer: Medicare Other | Source: Ambulatory Visit | Attending: Internal Medicine | Admitting: Internal Medicine

## 2011-07-14 ENCOUNTER — Other Ambulatory Visit: Payer: Self-pay | Admitting: Internal Medicine

## 2011-07-14 DIAGNOSIS — R42 Dizziness and giddiness: Secondary | ICD-10-CM

## 2011-07-29 ENCOUNTER — Inpatient Hospital Stay (HOSPITAL_COMMUNITY)
Admission: EM | Admit: 2011-07-29 | Discharge: 2011-08-04 | DRG: 193 | Disposition: A | Discharge status: Skilled Nursing Facility | Payer: Medicare Other | Attending: Internal Medicine | Admitting: Internal Medicine

## 2011-07-29 ENCOUNTER — Emergency Department (HOSPITAL_COMMUNITY): Payer: Medicare Other

## 2011-07-29 DIAGNOSIS — F0390 Unspecified dementia without behavioral disturbance: Secondary | ICD-10-CM | POA: Diagnosis present

## 2011-07-29 DIAGNOSIS — F329 Major depressive disorder, single episode, unspecified: Secondary | ICD-10-CM | POA: Diagnosis present

## 2011-07-29 DIAGNOSIS — N179 Acute kidney failure, unspecified: Secondary | ICD-10-CM | POA: Diagnosis present

## 2011-07-29 DIAGNOSIS — Z9981 Dependence on supplemental oxygen: Secondary | ICD-10-CM

## 2011-07-29 DIAGNOSIS — R197 Diarrhea, unspecified: Secondary | ICD-10-CM | POA: Diagnosis present

## 2011-07-29 DIAGNOSIS — Z6828 Body mass index (BMI) 28.0-28.9, adult: Secondary | ICD-10-CM

## 2011-07-29 DIAGNOSIS — J189 Pneumonia, unspecified organism: Principal | ICD-10-CM | POA: Diagnosis present

## 2011-07-29 DIAGNOSIS — E039 Hypothyroidism, unspecified: Secondary | ICD-10-CM | POA: Diagnosis present

## 2011-07-29 DIAGNOSIS — I2589 Other forms of chronic ischemic heart disease: Secondary | ICD-10-CM | POA: Diagnosis present

## 2011-07-29 DIAGNOSIS — Z951 Presence of aortocoronary bypass graft: Secondary | ICD-10-CM

## 2011-07-29 DIAGNOSIS — I69920 Aphasia following unspecified cerebrovascular disease: Secondary | ICD-10-CM

## 2011-07-29 DIAGNOSIS — I4891 Unspecified atrial fibrillation: Secondary | ICD-10-CM | POA: Diagnosis present

## 2011-07-29 DIAGNOSIS — Z7982 Long term (current) use of aspirin: Secondary | ICD-10-CM

## 2011-07-29 DIAGNOSIS — N289 Disorder of kidney and ureter, unspecified: Secondary | ICD-10-CM | POA: Diagnosis present

## 2011-07-29 DIAGNOSIS — I5023 Acute on chronic systolic (congestive) heart failure: Secondary | ICD-10-CM | POA: Diagnosis not present

## 2011-07-29 DIAGNOSIS — E876 Hypokalemia: Secondary | ICD-10-CM | POA: Diagnosis present

## 2011-07-29 DIAGNOSIS — IMO0001 Reserved for inherently not codable concepts without codable children: Secondary | ICD-10-CM | POA: Diagnosis present

## 2011-07-29 DIAGNOSIS — Z9181 History of falling: Secondary | ICD-10-CM

## 2011-07-29 DIAGNOSIS — I509 Heart failure, unspecified: Secondary | ICD-10-CM | POA: Diagnosis present

## 2011-07-29 DIAGNOSIS — E860 Dehydration: Secondary | ICD-10-CM | POA: Diagnosis present

## 2011-07-29 DIAGNOSIS — F3289 Other specified depressive episodes: Secondary | ICD-10-CM | POA: Diagnosis present

## 2011-07-29 DIAGNOSIS — Z79899 Other long term (current) drug therapy: Secondary | ICD-10-CM

## 2011-07-29 DIAGNOSIS — Z95810 Presence of automatic (implantable) cardiac defibrillator: Secondary | ICD-10-CM

## 2011-07-29 DIAGNOSIS — Z794 Long term (current) use of insulin: Secondary | ICD-10-CM

## 2011-07-29 DIAGNOSIS — I2510 Atherosclerotic heart disease of native coronary artery without angina pectoris: Secondary | ICD-10-CM | POA: Diagnosis present

## 2011-07-29 DIAGNOSIS — I951 Orthostatic hypotension: Secondary | ICD-10-CM | POA: Diagnosis present

## 2011-07-29 LAB — URINALYSIS, ROUTINE W REFLEX MICROSCOPIC
Glucose, UA: 100 mg/dL — AB
Hgb urine dipstick: NEGATIVE
Ketones, ur: 15 mg/dL — AB
Protein, ur: NEGATIVE mg/dL
Urobilinogen, UA: 0.2 mg/dL (ref 0.0–1.0)

## 2011-07-29 LAB — GLUCOSE, CAPILLARY: Glucose-Capillary: 179 mg/dL — ABNORMAL HIGH (ref 70–99)

## 2011-07-29 LAB — CBC
HCT: 38.9 % — ABNORMAL LOW (ref 39.0–52.0)
Hemoglobin: 13 g/dL (ref 13.0–17.0)
MCV: 88.4 fL (ref 78.0–100.0)
Platelets: 120 10*3/uL — ABNORMAL LOW (ref 150–400)
RBC: 4.4 MIL/uL (ref 4.22–5.81)
WBC: 12.7 10*3/uL — ABNORMAL HIGH (ref 4.0–10.5)

## 2011-07-29 LAB — POCT I-STAT TROPONIN I

## 2011-07-29 LAB — BASIC METABOLIC PANEL
CO2: 22 mEq/L (ref 19–32)
Chloride: 104 mEq/L (ref 96–112)
Creatinine, Ser: 2.89 mg/dL — ABNORMAL HIGH (ref 0.50–1.35)

## 2011-07-29 LAB — URINE MICROSCOPIC-ADD ON

## 2011-07-29 LAB — CK: Total CK: 441 U/L — ABNORMAL HIGH (ref 7–232)

## 2011-07-29 LAB — DIFFERENTIAL
Eosinophils Absolute: 0 10*3/uL (ref 0.0–0.7)
Lymphocytes Relative: 5 % — ABNORMAL LOW (ref 12–46)
Lymphs Abs: 0.6 10*3/uL — ABNORMAL LOW (ref 0.7–4.0)
Neutro Abs: 11.3 10*3/uL — ABNORMAL HIGH (ref 1.7–7.7)
Neutrophils Relative %: 89 % — ABNORMAL HIGH (ref 43–77)

## 2011-07-30 ENCOUNTER — Inpatient Hospital Stay (HOSPITAL_COMMUNITY): Payer: Medicare Other

## 2011-07-30 LAB — COMPREHENSIVE METABOLIC PANEL
ALT: 7 U/L (ref 0–53)
AST: 19 U/L (ref 0–37)
CO2: 18 mEq/L — ABNORMAL LOW (ref 19–32)
Calcium: 8.3 mg/dL — ABNORMAL LOW (ref 8.4–10.5)
Chloride: 107 mEq/L (ref 96–112)
Creatinine, Ser: 2.68 mg/dL — ABNORMAL HIGH (ref 0.50–1.35)
GFR calc Af Amer: 28 mL/min — ABNORMAL LOW (ref >60)
GFR calc non Af Amer: 23 mL/min — ABNORMAL LOW (ref >60)
Glucose, Bld: 132 mg/dL — ABNORMAL HIGH (ref 70–99)
Sodium: 138 mEq/L (ref 135–145)
Total Bilirubin: 1 mg/dL (ref 0.3–1.2)

## 2011-07-30 LAB — CBC
HCT: 37.3 % — ABNORMAL LOW (ref 39.0–52.0)
MCH: 29.3 pg (ref 26.0–34.0)
MCV: 88.8 fL (ref 78.0–100.0)
Platelets: 111 10*3/uL — ABNORMAL LOW (ref 150–400)
RDW: 15.6 % — ABNORMAL HIGH (ref 11.5–15.5)

## 2011-07-30 LAB — MAGNESIUM: Magnesium: 1.9 mg/dL (ref 1.5–2.5)

## 2011-07-30 LAB — GLUCOSE, CAPILLARY: Glucose-Capillary: 128 mg/dL — ABNORMAL HIGH (ref 70–99)

## 2011-07-30 LAB — TSH: TSH: 0.818 u[IU]/mL (ref 0.350–4.500)

## 2011-07-30 LAB — URINE CULTURE
Colony Count: NO GROWTH
Culture  Setup Time: 201208161504
Culture: NO GROWTH

## 2011-07-31 LAB — HEPATIC FUNCTION PANEL
ALT: 6 U/L (ref 0–53)
Alkaline Phosphatase: 56 U/L (ref 39–117)
Bilirubin, Direct: 0.3 mg/dL (ref 0.0–0.3)
Indirect Bilirubin: 0.5 mg/dL (ref 0.3–0.9)
Total Bilirubin: 0.8 mg/dL (ref 0.3–1.2)
Total Protein: 5.3 g/dL — ABNORMAL LOW (ref 6.0–8.3)

## 2011-07-31 LAB — CBC
HCT: 33.6 % — ABNORMAL LOW (ref 39.0–52.0)
Hemoglobin: 10.9 g/dL — ABNORMAL LOW (ref 13.0–17.0)
MCH: 29.2 pg (ref 26.0–34.0)
MCHC: 32.4 g/dL (ref 30.0–36.0)
MCV: 90.1 fL (ref 78.0–100.0)
RDW: 15.8 % — ABNORMAL HIGH (ref 11.5–15.5)

## 2011-07-31 LAB — BASIC METABOLIC PANEL
CO2: 20 mEq/L (ref 19–32)
Calcium: 8.2 mg/dL — ABNORMAL LOW (ref 8.4–10.5)
Creatinine, Ser: 1.89 mg/dL — ABNORMAL HIGH (ref 0.50–1.35)
GFR calc non Af Amer: 35 mL/min — ABNORMAL LOW (ref >60)
Sodium: 141 mEq/L (ref 135–145)

## 2011-07-31 LAB — CLOSTRIDIUM DIFFICILE BY PCR: Toxigenic C. Difficile by PCR: NEGATIVE

## 2011-07-31 LAB — GLUCOSE, CAPILLARY
Glucose-Capillary: 135 mg/dL — ABNORMAL HIGH (ref 70–99)
Glucose-Capillary: 149 mg/dL — ABNORMAL HIGH (ref 70–99)

## 2011-08-01 ENCOUNTER — Inpatient Hospital Stay (HOSPITAL_COMMUNITY): Payer: Medicare Other

## 2011-08-01 LAB — BASIC METABOLIC PANEL WITH GFR
BUN: 26 mg/dL — ABNORMAL HIGH (ref 6–23)
CO2: 20 meq/L (ref 19–32)
Calcium: 8.5 mg/dL (ref 8.4–10.5)
Chloride: 114 meq/L — ABNORMAL HIGH (ref 96–112)
Creatinine, Ser: 1.35 mg/dL (ref 0.50–1.35)
GFR calc Af Amer: >60 mL/min
GFR calc non Af Amer: 51 mL/min — ABNORMAL LOW
Glucose, Bld: 154 mg/dL — ABNORMAL HIGH (ref 70–99)
Potassium: 4.2 meq/L (ref 3.5–5.1)
Sodium: 140 meq/L (ref 135–145)

## 2011-08-01 LAB — GLUCOSE, CAPILLARY
Glucose-Capillary: 140 mg/dL — ABNORMAL HIGH (ref 70–99)
Glucose-Capillary: 157 mg/dL — ABNORMAL HIGH (ref 70–99)
Glucose-Capillary: 237 mg/dL — ABNORMAL HIGH (ref 70–99)

## 2011-08-01 LAB — CBC
HCT: 33.5 % — ABNORMAL LOW (ref 39.0–52.0)
MCH: 29.1 pg (ref 26.0–34.0)
MCHC: 31.9 g/dL (ref 30.0–36.0)
MCV: 91 fL (ref 78.0–100.0)
Platelets: 116 10*3/uL — ABNORMAL LOW (ref 150–400)
RDW: 15.9 % — ABNORMAL HIGH (ref 11.5–15.5)

## 2011-08-01 LAB — PRO B NATRIURETIC PEPTIDE: Pro B Natriuretic peptide (BNP): 2838 pg/mL — ABNORMAL HIGH (ref 0–450)

## 2011-08-01 NOTE — H&P (Signed)
NAME:  Troy Warren, FRALIX NO.:  [id number]  MEDICAL RECORD NO.:  [id number]  LOCATION:  5532                         FACILITY:  [facility]  PHYSICIAN:  Conley Canal, MD      DATE OF BIRTH:  [birth year]  DATE OF ADMISSION:  07/29/2011 DATE OF DISCHARGE:                             HISTORY & PHYSICAL   PRIMARY CARE PHYSICIAN:  Lucky Cowboy, M.D.  CHIEF COMPLAINT:  The patient fell at home.  HISTORY OF PRESENT ILLNESS:  [name] is a pleasant 77 year old male with history of diabetes mellitus type II, hypothyroidism, history of CVAs in the past, dementia, atrial fibrillation not chronically anticoagulated because of fall risk, history of pacemaker implant now has a biventricular device, history of ischemic cardiomyopathy with an EF of 25% hospitalization in May 2012 for acute on chronic CHF who came in at this time after he fell at home this morning.  When he presented to the emergency room, his blood pressure was found to be relatively low.  The patient is a poor historian, hence history was obtained from medical records as well as from his friend who is at the bedside.  The history is that the patient has been feeling generally weak for the last few days and has had some episodes of excessive sweating at night as well as diarrhea.  According to his friend, the patient was running to the bathroom several times yesterday, although the patient himself denies this history.  Otherwise, the patient admits to vomiting earlier this morning and he complains of some abdominal pain.  He denies any cough or shortness of breath.  However, at presentation he had a chest x- ray, which showed a right lower lobe opacity suspicious for pneumonia and his white count was 12.7.  The patient was also found to be in acute renal failure with a BUN of 47, creatinine 2.89, whereas the BUN and creatinine was normal in May 2012.  He was also hyperglycemic with a blood sugar of 393, but he  was an acidotic as his bicarb was 22.  The patient was given ceftriaxone and azithromycin and was started on IV fluids and referred to the hospitalist service for further management. His blood pressure has since improved and is in the 120s range systolic at the time of my evaluation.  The patient was started on an antibiotic for urinary tract infection by his primary care physician 2 days ago, but he does not recall the name of the antibiotic.  PAST MEDICAL HISTORY: 1. Diabetes mellitus type II. 2. History of chronic systolic congestive heart failure, EF of 40% to     45% in December 2010 status post biventricular pacemaker. 3. Chronic atrial fibrillation.  The patient is not on Coumadin due to     fall risk.  He is on aspirin. 4. CAD status post CABG in 2003. 5. Hypothyroidism. 6. Dementia. 7. Depression.  ALLERGIES:  No known drug allergies.  SOCIAL HISTORY:  The patient is married.  He denies cigarette smoking, alcohol, or illicit drugs.  FAMILY HISTORY:  The patient denies a history of chronic medical conditions.  HOME MEDICATIONS:  Benicar, hydralazine, Lasix, Imdur, Aricept, labetalol, Pravachol, Synthroid, KCl, Cardizem, and  citalopram.  REVIEW OF SYSTEMS:  Unremarkable except as highlighted in the history of present illness.  PHYSICAL EXAMINATION:  GENERAL:  This is a frail elderly male who is not in acute distress. VITAL SIGNS:  Blood pressure 120 systolic, heart rate 60s, he is febrile, temperature 98.2 degrees Fahrenheit, respirations 18, and oxygen saturation 96% on 2 L nasal cannula. HEAD, EARS, NOSE AND THROAT:  Pupils are equal and reacting to light. No jugular venous distention.  Dry oral mucosa.  No oral thrush. RESPIRATORY:  There is bilateral air entry with coarse basilar rhonchi on the right side.  No wheezing. CARDIOVASCULAR:  First and second heart sounds are heard.  No murmurs. Pulse regular. ABDOMEN:  Marked bowel sounds.  Otherwise abdomen soft,  slightly distended, no palpable organomegaly or masses. CNS:  The patient is alert and oriented in person, place, and time but seems forgetful.  No focal neurological deficits. EXTREMITIES:  No pedal edema.  Peripheral pulses are equal.  LABORATORY DATA:  Labs were reviewed significant for WBC 12.7, hemoglobin is 18, hematocrit 38.9, platelet count 120, and neutrophils 89%.  Sodium 139, potassium 3.5, BUN 47, creatinine 2.89, calcium 8.3, glucose 393, and CPK 441.  UA shows nitrites negative, leukocytes trace, wbc 3-6.  Chest x-ray shows right lower lobe opacity suspicious for pneumonia, 2-D echocardiogram in May 2012 showed an EF 60% to 65% with no wall motion abnormalities.  IMPRESSION:  A pleasant 77 year old male with extensive cardiac history and recent hospitalization in May 2012 at which point he apparently received some antibiotics, but was admitted at that time for acute on chronic systolic congestive heart failure who comes in this time with what looks like orthostatic hypotension secondary to gastrointestinal loss.  He has right lower lobe pneumonia, which could be community- acquired versus aspiration as the patient apparently vomited once this morning.  He has diarrhea which has contributed to the acute kidney injury and the concern would be for Clostridium difficile colitis.  He is also hyperglycemic with no evidence of diabetic ketoacidosis.  He does not have evidence of congestive heart failure decompensation.  PLAN: 1. Right lower lobe pneumonia, community-acquired versus aspiration     pneumonia.  We will place the patient on Zosyn to cover for     anaerobes as well as azithromycin for atypical organisms like     Mycoplasma. 2. Diarrhea with concern for C. difficile colitis.  We will place the     patient in contact isolation and gently rehydrate.  Obtain stool C     difficile PCR, meanwhile we will empirically treat with Flagyl     given degree of diarrhea. 3.  Acute kidney injury.  This is a combination of factors including     dehydration from gastrointestinal loss as well as medications     including Benicar and Lasix, expect improvement with rehydration.     Meanwhile, we will check random urine sodium, creatinine, and     protein.  Monitor input and output, place a Foley catheter.  If     renal picture is not improving, we will obtain renal ultrasound and     consider renal input.  We will avoid nephrotoxic medications. 4. Diabetes mellitus type II, uncontrolled.  We will place the patient     on Lantus and sliding scale insulin.  Gently rehydrate and monitor. 5. CAD, history of ischemic cardiomyopathy status post biventricular     implantable cardioverter defibrillator.  Last ejection fraction was     60%  to 65% in May 2012.  The patient has no cardiac symptoms.  We     will resume home medications including hydralazine, Imdur, and     labetalol.  Hold Benicar for now.  Continue Cardizem. 6. Hypokalemia.  Combination of factors including GI loss and     diuretics.  We will replenish as necessary. 7. Hypothyroidism.  Plan to continue Synthroid. 8. Hyperlipidemia.  We will hold Pravachol until diarrhea improved. 9. DVT and GI prophylaxis. 10.The patient's is condition closely guarded.   Conley Canal, MD     SR/MEDQ  D:  07/29/2011  T:  07/29/2011  Job:  [id number]  cc:   Lucky Cowboy, M.D. Thereasa Solo. Little, M.D.  Electronically Signed by Conley Canal  on 08/01/2011 02:25:41 PM

## 2011-08-02 LAB — BASIC METABOLIC PANEL
BUN: 17 mg/dL (ref 6–23)
Creatinine, Ser: 1.15 mg/dL (ref 0.50–1.35)
GFR calc Af Amer: >60 mL/min (ref >60)
GFR calc non Af Amer: >60 mL/min (ref >60)
Potassium: 3.6 mEq/L (ref 3.5–5.1)

## 2011-08-02 LAB — GLUCOSE, CAPILLARY
Glucose-Capillary: 98 mg/dL (ref 70–99)
Glucose-Capillary: 184 mg/dL — ABNORMAL HIGH (ref 70–99)

## 2011-08-02 LAB — PRO B NATRIURETIC PEPTIDE: Pro B Natriuretic peptide (BNP): 4496 pg/mL — ABNORMAL HIGH (ref 0–450)

## 2011-08-02 LAB — CBC
MCH: 28.8 pg (ref 26.0–34.0)
Platelets: 124 10*3/uL — ABNORMAL LOW (ref 150–400)
RBC: 4 MIL/uL — ABNORMAL LOW (ref 4.22–5.81)

## 2011-08-03 LAB — STOOL CULTURE

## 2011-08-03 LAB — BASIC METABOLIC PANEL
BUN: 12 mg/dL (ref 6–23)
CO2: 30 mEq/L (ref 19–32)
Chloride: 105 mEq/L (ref 96–112)
Creatinine, Ser: 1.07 mg/dL (ref 0.50–1.35)

## 2011-08-04 ENCOUNTER — Inpatient Hospital Stay (HOSPITAL_COMMUNITY): Payer: Medicare Other

## 2011-08-04 LAB — CBC
Hemoglobin: 11.7 g/dL — ABNORMAL LOW (ref 13.0–17.0)
MCH: 29 pg (ref 26.0–34.0)
MCHC: 32.7 g/dL (ref 30.0–36.0)
RDW: 15.1 % (ref 11.5–15.5)

## 2011-08-04 LAB — GLUCOSE, CAPILLARY
Glucose-Capillary: 178 mg/dL — ABNORMAL HIGH (ref 70–99)
Glucose-Capillary: 101 mg/dL — ABNORMAL HIGH (ref 70–99)

## 2011-08-04 LAB — BASIC METABOLIC PANEL
CO2: 32 mEq/L (ref 19–32)
Calcium: 9.4 mg/dL (ref 8.4–10.5)
Creatinine, Ser: 1.09 mg/dL (ref 0.50–1.35)
GFR calc non Af Amer: >60 mL/min (ref >60)
Glucose, Bld: 110 mg/dL — ABNORMAL HIGH (ref 70–99)
Sodium: 141 mEq/L (ref 135–145)

## 2011-08-04 LAB — PRO B NATRIURETIC PEPTIDE: Pro B Natriuretic peptide (BNP): 3886 pg/mL — ABNORMAL HIGH (ref 0–450)

## 2011-08-04 NOTE — Discharge Summary (Signed)
NAME:  Troy Warren, Troy Warren NO.:  [id number]  MEDICAL RECORD NO.:  [id number]  LOCATION:  5524                         FACILITY:  [facility]  PHYSICIAN:  Lonia Blood, M.D.       DATE OF BIRTH:  Oct 14, 1931  DATE OF ADMISSION:  07/29/2011 DATE OF DISCHARGE:  08/04/2011                              DISCHARGE SUMMARY   PRIMARY CARE PHYSICIAN:  Lucky Cowboy, MD  DISCHARGE DIAGNOSES: 1. Right lower lobe pneumonia. 2. Acute on chronic systolic and diastolic congestive heart failure. 3. Diabetes mellitus type 2. 4. Ischemic cardiomyopathy status post biventricular pacemaker     placement. 5. Chronic atrial fibrillation, not on Coumadin due to fall risk. 6. Coronary artery disease status post coronary artery bypass graft in     2003. 7. Hyperthyroidism. 8. Mild dementia. 9. Status post stroke with expressive aphasia. 10.Depression.  DISCHARGE MEDICATIONS: 1. Tylenol 650 mg by mouth every 4 hours as needed for pain. 2. Celexa 20 mg daily. 3. Famotidine 20 mg daily. 4. Insulin NovoLog sliding scale 1-9 units subcutaneously 3 times a     day with meals. 5. Lantus 10 units at bedtime. 6. Moxifloxacin 400 mg by mouth daily for two more days. 7. Resource 240 mL by mouth daily. 8. Florastor 250 mg by mouth twice a day. 9. Alprazolam 1 mg half tablet by mouth every evening. 10.Aricept 10 mg by mouth every morning. 11.Aspirin 81 mg daily. 12.Diltiazem 120 mg by mouth once a day. 14.Dorzolamide 1 drop in both eyes twice a day. 15.Imdur 60 mg by mouth daily. 17.Iron 1 tablet by mouth twice a day. 18.Labetalol 200 mg by mouth twice a day. 19.Lasix 40 mg by mouth once a day 20.Potassium chloride 10 mEq by mouth twice a day. 21.Pravachol 40 mg by mouth daily. 22.Synthroid 75 mcg by mouth daily. 23.Vitamin B12 one tablet by mouth every day. 24.Vitamin D 2000 2 tablets by mouth twice a day.  CONDITION ON DISCHARGE:  Troy Warren was discharged in good condition on 2 L of  oxygen.  Hemodynamically stable.  He will be discharged to a skilled nursing home for short-term rehabilitation.  The patient will be followed by the attending physician at the skilled nursing home.  He will have physical therapy, occupational therapy at the nursing home. Oxygen weaning can be done as needed.  PROCEDURE DONE THIS ADMISSION: 1. The patient underwent a chest x-ray portable with findings of right     lower lobe airspace disease. 2. A followup chest x-ray showing clearing of the right lower lobe     opacity, some cardiomegaly and vascular congestion.  HISTORY AND PHYSICAL:  Refer dictated H and P done by Dr. Venetia Constable.  HOSPITAL COURSE:  Troy Warren is a 77 year old gentleman with history of congestive heart failure and diabetes, who presented to the emergency room with fall at home, increased weakness.  He was diagnosed with right lower lobe community-acquired pneumonia versus aspiration pneumonia.  He also had some diarrhea.  His C. diff toxin was negative.  He was treated empirically on intravenous Zosyn with improvement in his respiratory status.  He initially had his diuretics put on the hold because he was having diarrhea, he received  intravenous fluids.  He initially had also acute renal insufficiency.  His acute renal insufficiency improved after intravenous fluids, but then he became a little bit volume overload and require intravenous diuresis.  His diarrhea resolved completely with just one loose stool by August 01, 2011.  He had empiric Flagyl initially.  His C. diff was negative.  We did not treat him with long- term Flagyl.  The patient progressed nicely and his antibiotics generally was consolidated to oral Avelox.  He was seen by the physical therapist, occupational therapist felt that he could benefit from short- term rehabilitation.  He is discharged to the nursing home in stable condition with a creatinine of 1.0, oxygen requirement only 2 liters per nasal  cannula, and plans for physical therapy, occupational therapy.     Lonia Blood, M.D.     SL/MEDQ  D:  08/04/2011  T:  08/04/2011  Job:  [id number]  cc:   Lucky Cowboy, M.D.  Electronically Signed by Lonia Blood M.D. on 08/04/2011 02:21:45 PM

## 2011-08-05 LAB — CULTURE, BLOOD (ROUTINE X 2)
Culture  Setup Time: 201208170142
Culture: NO GROWTH
Culture: NO GROWTH

## 2011-08-30 ENCOUNTER — Other Ambulatory Visit (HOSPITAL_COMMUNITY): Payer: Self-pay | Admitting: Internal Medicine

## 2011-08-30 ENCOUNTER — Ambulatory Visit (HOSPITAL_COMMUNITY)
Admission: RE | Admit: 2011-08-30 | Discharge: 2011-08-30 | Disposition: A | Discharge status: Home / Self Care | Payer: Medicare Other | Source: Ambulatory Visit | Attending: Internal Medicine | Admitting: Internal Medicine

## 2011-08-30 DIAGNOSIS — R0602 Shortness of breath: Secondary | ICD-10-CM | POA: Insufficient documentation

## 2011-08-30 DIAGNOSIS — Z8701 Personal history of pneumonia (recurrent): Secondary | ICD-10-CM

## 2011-09-06 LAB — BASIC METABOLIC PANEL
CO2: 30
CO2: 36 — ABNORMAL HIGH
Calcium: 8.7
Calcium: 8.8
Calcium: 9.3
Chloride: 101
Chloride: 102
Chloride: 97
Creatinine, Ser: 1.29
Creatinine, Ser: 1.32
GFR calc Af Amer: >60
Glucose, Bld: 128 — ABNORMAL HIGH
Glucose, Bld: 68 — ABNORMAL LOW
Sodium: 137
Sodium: 140

## 2011-09-06 LAB — CARDIAC PANEL(CRET KIN+CKTOT+MB+TROPI)
Relative Index: INVALID
Relative Index: INVALID
Troponin I: 0.03
Troponin I: 0.03

## 2011-09-06 LAB — CBC
Hemoglobin: 14.1
Hemoglobin: 14.4
MCHC: 33.7
MCHC: 34.3
MCHC: 34.4
MCV: 89.9
MCV: 90.2
MCV: 90.4
Platelets: 145 — ABNORMAL LOW
RBC: 4.54
RBC: 4.72
RDW: 15.5
RDW: 15.5
WBC: 6.7

## 2011-09-06 LAB — LIPID PANEL
HDL: 39 — ABNORMAL LOW
Total CHOL/HDL Ratio: 2.9
Triglycerides: 86

## 2011-09-06 LAB — POCT I-STAT, CHEM 8
Calcium, Ion: 1.08 — ABNORMAL LOW
Glucose, Bld: 136 — ABNORMAL HIGH
HCT: 43
Hemoglobin: 14.6
TCO2: 31

## 2011-09-06 LAB — CK TOTAL AND CKMB (NOT AT ARMC): CK, MB: 3.3

## 2011-09-06 LAB — COMPREHENSIVE METABOLIC PANEL
ALT: 15
AST: 23
Albumin: 3.6
Chloride: 104
Creatinine, Ser: 1.45
GFR calc Af Amer: 58 — ABNORMAL LOW
Potassium: 4.2
Sodium: 145
Total Bilirubin: 1.9 — ABNORMAL HIGH

## 2011-09-06 LAB — DIFFERENTIAL
Basophils Absolute: 0
Basophils Relative: 0
Eosinophils Absolute: 0.1
Monocytes Absolute: 0.7
Monocytes Relative: 8

## 2011-09-06 LAB — TSH: TSH: 0.855

## 2011-09-06 LAB — PROTIME-INR: INR: 1.1

## 2011-09-06 LAB — MAGNESIUM: Magnesium: 2.4

## 2011-09-08 LAB — BLOOD GAS, ARTERIAL
Acid-Base Excess: 0.2
Acid-Base Excess: 0.2
Bicarbonate: 25.1 — ABNORMAL HIGH
Drawn by: 22430
FIO2: 0.5
FIO2: 0.5
FIO2: 0.7
FIO2: 1
MECHVT: 500
MECHVT: 500
Mode: POSITIVE
O2 Saturation: 94
O2 Saturation: 95.8
PEEP: 8
PEEP: 8
Patient temperature: 98.6
Patient temperature: 98.6
Patient temperature: 98.6
RATE: 14
RATE: 14
RATE: 18
TCO2: 26.6
pCO2 arterial: 46.5 — ABNORMAL HIGH
pCO2 arterial: 48.3 — ABNORMAL HIGH
pH, Arterial: 7.335 — ABNORMAL LOW
pH, Arterial: 7.408
pO2, Arterial: 75.9 — ABNORMAL LOW

## 2011-09-08 LAB — CULTURE, BLOOD (ROUTINE X 2)
Culture: NO GROWTH
Culture: NO GROWTH

## 2011-09-08 LAB — LEGIONELLA ANTIGEN, URINE: Legionella Antigen, Urine: NEGATIVE

## 2011-09-08 LAB — CBC
Hemoglobin: 10.7 — ABNORMAL LOW
MCHC: 34.2
MCHC: 34.9
MCHC: 35.1
MCV: 91
MCV: 92
Platelets: 132 — ABNORMAL LOW
RBC: 3.28 — ABNORMAL LOW
RBC: 4.56
RDW: 14.8
RDW: 15.6 — ABNORMAL HIGH
WBC: 10.8 — ABNORMAL HIGH

## 2011-09-08 LAB — BASIC METABOLIC PANEL
BUN: 16
CO2: 27
CO2: 32
Calcium: 7.7 — ABNORMAL LOW
Calcium: 8.6
Chloride: 103
Creatinine, Ser: 1.22
GFR calc Af Amer: 49 — ABNORMAL LOW
GFR calc Af Amer: >60
GFR calc non Af Amer: 40 — ABNORMAL LOW
Glucose, Bld: 85
Sodium: 144

## 2011-09-08 LAB — POCT I-STAT 3, VENOUS BLOOD GAS (G3P V)
Acid-Base Excess: 1
O2 Saturation: 72
TCO2: 31
pCO2, Ven: 64.7 — ABNORMAL HIGH
pO2, Ven: 47 — ABNORMAL HIGH

## 2011-09-08 LAB — POCT I-STAT, CHEM 8
Calcium, Ion: 1.13
Chloride: 101
Creatinine, Ser: 1.5
Glucose, Bld: 194 — ABNORMAL HIGH
Hemoglobin: 15
Potassium: 3.7

## 2011-09-08 LAB — POCT CARDIAC MARKERS
Myoglobin, poc: 75
Operator id: 270651

## 2011-09-08 LAB — PROTIME-INR
INR: 1
INR: 1
Prothrombin Time: 13.4

## 2011-09-08 LAB — APTT: aPTT: 26

## 2011-09-08 LAB — CULTURE, BAL-QUANTITATIVE W GRAM STAIN

## 2011-09-08 LAB — URINE CULTURE
Colony Count: NO GROWTH
Special Requests: NEGATIVE

## 2011-09-08 LAB — DIFFERENTIAL
Basophils Absolute: 0
Basophils Relative: 0
Eosinophils Absolute: 0.1
Monocytes Relative: 1 — ABNORMAL LOW
Neutro Abs: 10.2 — ABNORMAL HIGH
Neutrophils Relative %: 92 — ABNORMAL HIGH

## 2011-09-08 LAB — INFLUENZA A+B VIRUS AG-DIRECT(RAPID)
Inflenza A Ag: NEGATIVE
Influenza B Ag: NEGATIVE

## 2011-09-08 LAB — LACTIC ACID, PLASMA: Lactic Acid, Venous: 2

## 2011-09-08 LAB — CORTISOL: Cortisol, Plasma: 33.2

## 2011-09-09 LAB — BASIC METABOLIC PANEL
BUN: 19
BUN: 23
BUN: 25 — ABNORMAL HIGH
CO2: 27
CO2: 29
CO2: 30
CO2: 33 — ABNORMAL HIGH
CO2: 33 — ABNORMAL HIGH
Calcium: 8.3 — ABNORMAL LOW
Calcium: 8.3 — ABNORMAL LOW
Chloride: 103
Chloride: 107
Chloride: 107
Chloride: 109
Chloride: 98
Creatinine, Ser: 1.08
Creatinine, Ser: 1.09
Creatinine, Ser: 1.11
Creatinine, Ser: 1.22
GFR calc Af Amer: >60
GFR calc Af Amer: >60
GFR calc Af Amer: >60
GFR calc Af Amer: >60
GFR calc Af Amer: >60
GFR calc non Af Amer: 58 — ABNORMAL LOW
GFR calc non Af Amer: >60
GFR calc non Af Amer: >60
GFR calc non Af Amer: >60
Glucose, Bld: 126 — ABNORMAL HIGH
Glucose, Bld: 140 — ABNORMAL HIGH
Glucose, Bld: 162 — ABNORMAL HIGH
Potassium: 3.4 — ABNORMAL LOW
Potassium: 3.4 — ABNORMAL LOW
Potassium: 3.5
Potassium: 3.7
Potassium: 3.7
Potassium: 3.8
Sodium: 140
Sodium: 141
Sodium: 141
Sodium: 142
Sodium: 144
Sodium: 145

## 2011-09-09 LAB — CBC
HCT: 29.5 — ABNORMAL LOW
HCT: 30.3 — ABNORMAL LOW
HCT: 30.3 — ABNORMAL LOW
HCT: 31 — ABNORMAL LOW
Hemoglobin: 10.2 — ABNORMAL LOW
Hemoglobin: 10.2 — ABNORMAL LOW
Hemoglobin: 10.8 — ABNORMAL LOW
Hemoglobin: 10.9 — ABNORMAL LOW
Hemoglobin: 10.9 — ABNORMAL LOW
MCHC: 33.7
MCHC: 34.2
MCHC: 34.7
MCHC: 34.8
MCHC: 35.3
MCV: 91.8
MCV: 92
MCV: 92.4
MCV: 92.8
MCV: 92.9
Platelets: 134 — ABNORMAL LOW
Platelets: 135 — ABNORMAL LOW
RBC: 3.12 — ABNORMAL LOW
RBC: 3.2 — ABNORMAL LOW
RBC: 3.26 — ABNORMAL LOW
RBC: 3.49 — ABNORMAL LOW
RDW: 15.6 — ABNORMAL HIGH
RDW: 15.8 — ABNORMAL HIGH
RDW: 15.8 — ABNORMAL HIGH
RDW: 16 — ABNORMAL HIGH
WBC: 10.6 — ABNORMAL HIGH
WBC: 6.7
WBC: 6.9

## 2011-09-09 LAB — BLOOD GAS, ARTERIAL
Acid-Base Excess: 1.6
Acid-Base Excess: 4.4 — ABNORMAL HIGH
Acid-Base Excess: 7.6 — ABNORMAL HIGH
Acid-Base Excess: 9.2 — ABNORMAL HIGH
Bicarbonate: 26.4 — ABNORMAL HIGH
Bicarbonate: 27.8 — ABNORMAL HIGH
Bicarbonate: 28.6 — ABNORMAL HIGH
Bicarbonate: 32.1 — ABNORMAL HIGH
Delivery systems: POSITIVE
Drawn by: 224301
Drawn by: 27553
FIO2: 0.4
FIO2: 0.5
O2 Content: 5
O2 Content: 5
O2 Saturation: 91.5
O2 Saturation: 91.9
O2 Saturation: 93.3
O2 Saturation: 98.1
Patient temperature: 98.5
Patient temperature: 98.6
Patient temperature: 98.6
Patient temperature: 98.6
TCO2: 28.7
TCO2: 29.1
TCO2: 33.6
TCO2: 35.9
pCO2 arterial: 42.8
pCO2 arterial: 48.3 — ABNORMAL HIGH
pCO2 arterial: 49.9 — ABNORMAL HIGH
pCO2 arterial: 53.6 — ABNORMAL HIGH
pH, Arterial: 7.399
pH, Arterial: 7.428
pO2, Arterial: 104 — ABNORMAL HIGH
pO2, Arterial: 62.5 — ABNORMAL LOW
pO2, Arterial: 77.6 — ABNORMAL LOW
pO2, Arterial: 81.3

## 2011-09-09 LAB — COMPREHENSIVE METABOLIC PANEL
BUN: 22
CO2: 29
Calcium: 8.5
Creatinine, Ser: 1.01
GFR calc non Af Amer: >60
Glucose, Bld: 116 — ABNORMAL HIGH

## 2011-09-09 LAB — RETICULOCYTES
RBC.: 3.7 — ABNORMAL LOW
Retic Count, Absolute: 111

## 2011-09-09 LAB — IRON AND TIBC
Iron: 58
TIBC: 265
UIBC: 207

## 2011-09-09 LAB — MAGNESIUM
Magnesium: 2.2
Magnesium: 2.4
Magnesium: 2.4

## 2011-09-09 LAB — VITAMIN B12: Vitamin B-12: 347 (ref 211–911)

## 2011-09-09 LAB — FOLATE: Folate: 8.7

## 2011-09-09 LAB — PHOSPHORUS
Phosphorus: 3.4
Phosphorus: 3.9

## 2011-09-09 LAB — FERRITIN: Ferritin: 132 (ref 22–322)

## 2011-09-09 LAB — B-NATRIURETIC PEPTIDE (CONVERTED LAB): Pro B Natriuretic peptide (BNP): 570 — ABNORMAL HIGH

## 2012-03-21 ENCOUNTER — Ambulatory Visit (HOSPITAL_COMMUNITY)
Admission: RE | Admit: 2012-03-21 | Discharge: 2012-03-21 | Disposition: A | Discharge status: Home / Self Care | Payer: Medicare Other | Source: Ambulatory Visit | Attending: Physician Assistant | Admitting: Physician Assistant

## 2012-03-21 ENCOUNTER — Other Ambulatory Visit (HOSPITAL_COMMUNITY): Payer: Self-pay | Admitting: Physician Assistant

## 2012-03-21 DIAGNOSIS — Z0000 Encounter for general adult medical examination without abnormal findings: Secondary | ICD-10-CM | POA: Insufficient documentation

## 2012-03-21 DIAGNOSIS — R0602 Shortness of breath: Secondary | ICD-10-CM

## 2012-03-21 DIAGNOSIS — Z950 Presence of cardiac pacemaker: Secondary | ICD-10-CM | POA: Insufficient documentation

## 2012-10-17 ENCOUNTER — Encounter (HOSPITAL_COMMUNITY): Payer: Self-pay | Admitting: Pharmacy Technician

## 2012-10-17 ENCOUNTER — Other Ambulatory Visit: Payer: Self-pay | Admitting: Cardiovascular Disease

## 2012-10-23 ENCOUNTER — Other Ambulatory Visit: Payer: Self-pay | Admitting: Cardiovascular Disease

## 2012-10-23 ENCOUNTER — Ambulatory Visit
Admission: RE | Admit: 2012-10-23 | Discharge: 2012-10-23 | Disposition: A | Discharge status: Home / Self Care | Payer: Medicare Other | Source: Ambulatory Visit | Attending: Cardiovascular Disease | Admitting: Cardiovascular Disease

## 2012-10-23 DIAGNOSIS — Z01811 Encounter for preprocedural respiratory examination: Secondary | ICD-10-CM

## 2012-10-26 MED ORDER — SODIUM CHLORIDE 0.9 % IR SOLN
80.0000 mg | Status: DC
  Filled 2012-10-26: qty 2

## 2012-10-26 MED ORDER — CEFAZOLIN SODIUM-DEXTROSE 2-3 GM-% IV SOLR
2.0000 g | INTRAVENOUS | Status: DC
  Filled 2012-10-26: qty 50

## 2012-10-27 ENCOUNTER — Encounter (HOSPITAL_COMMUNITY)
Admission: RE | Disposition: A | Discharge status: Home / Self Care | Payer: Self-pay | Source: Ambulatory Visit | Attending: Cardiovascular Disease

## 2012-10-27 ENCOUNTER — Ambulatory Visit (HOSPITAL_COMMUNITY)
Admission: RE | Admit: 2012-10-27 | Discharge: 2012-10-27 | Disposition: A | Discharge status: Home / Self Care | Payer: Medicare Other | Source: Ambulatory Visit | Attending: Cardiovascular Disease | Admitting: Cardiovascular Disease

## 2012-10-27 DIAGNOSIS — Z4502 Encounter for adjustment and management of automatic implantable cardiac defibrillator: Secondary | ICD-10-CM | POA: Insufficient documentation

## 2012-10-27 HISTORY — PX: BIV ICD GENERTAOR CHANGE OUT: SHX5745

## 2012-10-27 LAB — GLUCOSE, CAPILLARY: Glucose-Capillary: 177 mg/dL — ABNORMAL HIGH (ref 70–99)

## 2012-10-27 LAB — SURGICAL PCR SCREEN: Staphylococcus aureus: POSITIVE — AB

## 2012-10-27 SURGERY — BIV ICD GENERTAOR CHANGE OUT
Anesthesia: LOCAL

## 2012-10-27 MED ORDER — CHLORHEXIDINE GLUCONATE 4 % EX LIQD: 60.0000 mL | Freq: Once | CUTANEOUS | Status: DC

## 2012-10-27 MED ORDER — CEFAZOLIN SODIUM-DEXTROSE 2-3 GM-% IV SOLR
INTRAVENOUS | Status: AC
  Filled 2012-10-27: qty 50

## 2012-10-27 MED ORDER — SODIUM CHLORIDE 0.45 % IV SOLN
INTRAVENOUS | Status: DC
  Administered 2012-10-27: 07:00:00 via INTRAVENOUS

## 2012-10-27 MED ORDER — LIDOCAINE HCL (PF) 1 % IJ SOLN
INTRAMUSCULAR | Status: AC
  Filled 2012-10-27: qty 60

## 2012-10-27 MED ORDER — SODIUM CHLORIDE 0.9 % IJ SOLN: 3.0000 mL | INTRAMUSCULAR | Status: DC | PRN

## 2012-10-27 MED ORDER — MUPIROCIN 2 % EX OINT
TOPICAL_OINTMENT | Freq: Two times a day (BID) | CUTANEOUS | Status: DC
  Administered 2012-10-27: 1 via NASAL
  Filled 2012-10-27: qty 22

## 2012-10-27 NOTE — CV Procedure (Signed)
Troy Warren,Troy Warren Male, 78 y.o., [birth year]  Location: [facility]  Bed: NONE  MRN: [id number]  CSN: [id number]  Admit Dt: 10/27/2012  Procedure report  Procedure performed:  Dual chamber CRT-D generator changeout  Reason for procedure:  Device generator at elective replacement interval  Procedure performed by:  Thurmon Fair, MD  Complications:  None  Estimated blood loss:  <5 mL  Medications administered during procedure:  Ancef 2 g intravenously, lidocaine 1% 30 mL locally  Device details:  Therapist, art model number D314TRG, serial number [id number] Right atrial lead (chronic) Medtronic, model number [id number], serial [id number] (implanted 05/01/2008) Right ventricular lead (chronic)  Medtronic, model number C320749, serial number [id number] (implanted 05/01/2008) Left ventricular lead  Medtronic, model number [id number], serial number [id number] (implanted 05/01/2008) Explanted generator Medtronic [place],  model number [id number], serial number  [id number] (implanted 05/01/2008)  Procedure details:  After the risks and benefits of the procedure were discussed the patient provided informed consent. She was brought to the cardiac catheter lab in the fasting state. The patient was prepped and draped in usual sterile fashion. Local anesthesia with 1% lidocaine was administered to to the left infraclavicular area. A 5-6cm horizontal incision was made parallel with and 2-3 cm caudal to the left clavicle, in the area of an old scar. An older scar was seen closer to the left clavicle. Using minimal electrocautery and mostly sharp and blunt dissection the prepectoral pocket was opened carefully to avoid injury to thet loops of chronic leads. Extensive dissection was necessary. The device was explanted. The pocket was carefully inspected for hemostasis and flushed with copious amounts of antibiotic solution.  The leads were disconnected from the old generator and testing of the lead  parameters later showed excellent values. The new generator was connected to the chronic leads, with appropriate pacing noted.   The entire system was then carefully inserted in the pocket with care been taking that the leads and device assumed a comfortable position without pressure on the incision. Great care was taken that the leads be located deep to the generator. The pocket was then closed in layers using 2 layers of 2-0 Vicryl and cutaneous staples after which a sterile dressing was applied.   At the end of the procedure the following lead parameters were encountered:   Right atrial lead sensed P waves 1.6 mV, impedance 404 ohms, threshold not tested due to AF  Right ventricular lead sensed R waves  8.0 mV, impedance 453 ohms, threshold 1.1 at 0.4 ms pulse width.  Left ventricular lead impedance 594 ohms, threshold 1.3 at 1.0 ms pulse width.  HV 57/59 ohm (dual coil).   Thurmon Fair, MD, [facility] [phone] office [phone] pager 10/27/2012.now

## 2012-10-27 NOTE — Progress Notes (Signed)
Pt's PCR is positive for staff and MRSA.  Pt and wife given verbal and written instructions and mupiricin to use at home for 5 days 2x day.  Both verbalize understanding.

## 2012-10-27 NOTE — H&P (Signed)
Date of Initial H&P:   History reviewed, patient examined, no change in status, stable for surgery. Generator change (elective replacement indicator). This procedure has been fully reviewed with the patient and written informed consent has been obtained.  Thurmon Fair, MD, [facility] [phone] office [phone] pager 10/27/2012 8:06 AM

## 2012-10-27 NOTE — Progress Notes (Signed)
Pt received from Renee Richards,R.N. To discharge

## 2013-03-18 LAB — ICD DEVICE OBSERVATION

## 2013-05-01 ENCOUNTER — Other Ambulatory Visit: Payer: Self-pay | Admitting: *Deleted

## 2013-05-01 ENCOUNTER — Encounter: Payer: Self-pay | Admitting: *Deleted

## 2013-05-01 ENCOUNTER — Encounter: Payer: Self-pay | Admitting: Neurology

## 2013-05-01 MED ORDER — FUROSEMIDE 40 MG PO TABS: 40.0000 mg | ORAL_TABLET | Freq: Every day | ORAL | Status: DC

## 2013-05-01 NOTE — Telephone Encounter (Signed)
rx sent to pharmacy by e-script  

## 2013-05-02 ENCOUNTER — Encounter: Payer: Self-pay | Admitting: Neurology

## 2013-05-03 ENCOUNTER — Other Ambulatory Visit: Payer: Self-pay | Admitting: *Deleted

## 2013-05-03 ENCOUNTER — Ambulatory Visit: Payer: Self-pay | Admitting: Neurology

## 2013-05-08 ENCOUNTER — Other Ambulatory Visit: Payer: Self-pay | Admitting: Neurology

## 2013-05-20 ENCOUNTER — Other Ambulatory Visit: Payer: Self-pay | Admitting: Cardiovascular Disease

## 2013-05-20 DIAGNOSIS — I428 Other cardiomyopathies: Secondary | ICD-10-CM

## 2013-05-20 DIAGNOSIS — I4891 Unspecified atrial fibrillation: Secondary | ICD-10-CM

## 2013-05-20 DIAGNOSIS — I495 Sick sinus syndrome: Secondary | ICD-10-CM

## 2013-05-20 DIAGNOSIS — I509 Heart failure, unspecified: Secondary | ICD-10-CM

## 2013-05-28 ENCOUNTER — Encounter: Payer: Self-pay | Admitting: *Deleted

## 2013-05-28 LAB — REMOTE ICD DEVICE
FVT: 0
PACEART VT: 0
RV LEAD IMPEDENCE ICD: 361 Ohm
TZAT-0001SLOWVT: 1
TZAT-0013SLOWVT: 2
TZON-0003SLOWVT: 340.9 ms
TZON-0004SLOWVT: 20
TZON-0004VSLOWVT: 24
TZON-0005SLOWVT: 12
TZST-0001SLOWVT: 3
TZST-0001SLOWVT: 6
TZST-0003SLOWVT: 20 J
TZST-0003SLOWVT: 35 J
VF: 0

## 2013-06-03 ENCOUNTER — Other Ambulatory Visit: Payer: Self-pay

## 2013-06-03 MED ORDER — DONEPEZIL HCL 10 MG PO TABS: 10.0000 mg | ORAL_TABLET | Freq: Every day | ORAL | Status: DC

## 2013-06-05 ENCOUNTER — Ambulatory Visit: Payer: Medicare Other | Admitting: Cardiovascular Disease

## 2013-06-06 ENCOUNTER — Ambulatory Visit: Payer: Medicare Other | Admitting: Physician Assistant

## 2013-06-13 ENCOUNTER — Other Ambulatory Visit: Payer: Self-pay | Admitting: Neurology

## 2013-06-24 ENCOUNTER — Other Ambulatory Visit: Payer: Self-pay | Admitting: Cardiovascular Disease

## 2013-06-24 LAB — ICD DEVICE OBSERVATION

## 2013-06-26 ENCOUNTER — Encounter: Payer: Self-pay | Admitting: Cardiovascular Disease

## 2013-07-05 ENCOUNTER — Ambulatory Visit (INDEPENDENT_AMBULATORY_CARE_PROVIDER_SITE_OTHER): Payer: Medicare Other | Admitting: Neurology

## 2013-07-05 ENCOUNTER — Other Ambulatory Visit: Payer: Self-pay

## 2013-07-05 ENCOUNTER — Telehealth: Payer: Self-pay | Admitting: Neurology

## 2013-07-05 VITALS — BP 134/67 | HR 67 | Temp 98.2°F | Ht 66.5 in | Wt 170.0 lb

## 2013-07-05 DIAGNOSIS — F0390 Unspecified dementia without behavioral disturbance: Secondary | ICD-10-CM

## 2013-07-05 MED ORDER — MEMANTINE HCL ER 7 MG PO CP24: 7.0000 mg | ORAL_CAPSULE | ORAL | Status: DC

## 2013-07-05 NOTE — Telephone Encounter (Signed)
Rx was already sent to the pharmacy.  I have resent it again.  The patient will need to follow up with the pharmacy to see when meds will be ready for pick up.  I called the patient back.  He will contact the pharmacy.

## 2013-07-05 NOTE — Patient Instructions (Addendum)
Trial of Namenda for worsening vascular dementia. Start Namenda excess 7 mg daily for one week followed by 14 mg daily for one week then 21 mg and 28 mg once daily if tolerated. Continue Aricept 10 mg daily. Return for followup in 2 months with Heide Guile,, NP

## 2013-07-05 NOTE — Telephone Encounter (Signed)
Pt's wife Gardiner Ramus called wants to know if pt's prescription was called in to [facility] for Memantine HCl ER (NAMENDA XR) 7 MG. Gardiner Ramus states they got directions just no prescription. They would like for someone to call him concerning this matter so that they may pick this up. Thanks

## 2013-07-07 NOTE — Progress Notes (Signed)
[facility] [address] [place]. [place] [address] [phone]       OFFICE FOLLOW-UP NOTE  [name] Date of Birth:  March 19, 1931 Medical Record Number:  [id number]   HPI: 48 male with a vascular dementia following left temporal intercerebral hemorrhage in 2007 from warfarin related coagulopathy and atrial fibrillation. 07/05/2013 He returns for followup today after last visit on 05/15/2012. Is accompanied by his wife who feel that she has noticed worsening of his memory and cognitive abilities. He is cannot remember at times his name several familiar places and needs more help with activities of daily living. Is still able to walk quite well and has good balance and has not had any falls. Does not have any delusions or hallucinations her concerns about agitation her safety. He is tolerating Aricept 10 mg daily without any side effects. He has not yet tried Namenda in the past. Wife state that she is quite comfortable taking care of him at home with some help from her daughter. On Mini-Mental status exam testing today his score 9/30 which is a 5 point decline from 14/30 at last visit one year ago ROS:   14 system review of systems is positive for weight gain, loss of vision, shortness of breath, swelling in legs, hearing loss, diarrhea, feeling hot, feeling cold, easy bruising, runny nose, confusion, memory loss, poor sleep, change in appetite and confusion.  PMH:  Past Medical History  Diagnosis Date  . Hypertension   . Heart disease   . Hyperlipemia   . Depression   . Testicular cancer   . Diabetes mellitus without complication   . Polio     child polio  . Rheumatic fever   . Mild dementia   . Mild dementia     Social History:  History   Social History  . Marital Status: Married    Spouse Name: N/A    Number of Children: N/A  . Years of Education: N/A   Occupational History  . Not on file.   Social History Main Topics  . Smoking status: Not  on file  . Smokeless tobacco: Not on file  . Alcohol Use: Not on file  . Drug Use: Not on file  . Sexually Active: Not on file   Other Topics Concern  . Not on file   Social History Narrative  . No narrative on file    Medications:   Current Outpatient Prescriptions on File Prior to Visit  Medication Sig Dispense Refill  . ALPRAZolam (XANAX) 1 MG tablet Take 0.5 mg by mouth at bedtime. Take every night per wife      . aspirin 81 MG tablet Take 81 mg by mouth at bedtime.      . citalopram (CELEXA) 40 MG tablet Take 40 mg by mouth daily.      . Cyanocobalamin (VITAMIN B-12 PO) Take 1 tablet by mouth daily.      Marland Kitchen diltiazem (CARDIZEM) 120 MG tablet Take 120 mg by mouth 2 (two) times daily.      . diphenoxylate-atropine (LOMOTIL) 2.5-0.025 MG per tablet       . donepezil (ARICEPT) 10 MG tablet TAKE 1 TABLET BY MOUTH EVERY DAY  30 tablet  0  . dorzolamide-timolol (COSOPT) 22.3-6.8 MG/ML ophthalmic solution       . ferrous fumarate (HEMOCYTE - 106 MG FE) 325 (106 FE) MG TABS Take 1 tablet by mouth 2 (two) times daily.      . furosemide (LASIX) 40  MG tablet Take 1 tablet (40 mg total) by mouth daily.  30 tablet  11  . insulin glargine (LANTUS) 100 UNIT/ML injection Inject 30 Units into the skin at bedtime.      . isosorbide mononitrate (IMDUR) 60 MG 24 hr tablet Take 60 mg by mouth 2 (two) times daily. Takes twice daily per wife      . labetalol (NORMODYNE) 200 MG tablet Take 200 mg by mouth 2 (two) times daily.      Marland Kitchen levothyroxine (SYNTHROID, LEVOTHROID) 75 MCG tablet Take 75 mcg by mouth daily.      Marland Kitchen losartan (COZAAR) 100 MG tablet       . meloxicam (MOBIC) 15 MG tablet       . Multiple Vitamins-Minerals (ICAPS) CAPS Take 1 capsule by mouth daily.      . potassium chloride (K-DUR) 10 MEQ tablet Take 10 mEq by mouth 2 (two) times daily.      . pravastatin (PRAVACHOL) 40 MG tablet Take 40 mg by mouth daily.       No current facility-administered medications on file prior to visit.     Allergies:  No Known Allergies Filed Vitals:   07/05/13 1416  BP: 134/67  Pulse: 67  Temp: 98.2 F (36.8 C)    Physical Exam General: well developed, well nourished, seated, in no evident distress Head: head normocephalic and atraumatic. Orohparynx benign Neck: supple with no carotid or supraclavicular bruits Cardiovascular: regular rate and rhythm, no murmurs Musculoskeletal: no deformity Skin:  no rash/petichiae Vascular:  Normal pulses all extremities  Neurologic Exam Mental Status: Awake and fully alert. Disoriented to place and time. Recent and remote memory diminished. Attention span, concentration and fund of knowledge diminishedte. Mood and affect appropriate. MMSE  9/30 ( down from 15/30 last visit).Animal Naming test 6 only. Cranial Nerves: Fundoscopic exam not done  Pupils equal, briskly reactive to light. Extraocular movements full without nystagmus. Visual fields full to confrontation. Hearing intact. Facial sensation intact. Face, tongue, palate moves normally and symmetrically.  Motor: Normal bulk and tone. Normal strength in all tested extremity muscles. Sensory.: intact to tough and pinprick and vibratory.  Coordination: Rapid alternating movements normal in all extremities. Finger-to-nose and heel-to-shin performed accurately bilaterally. Gait and Station: Arises from chair without difficulty. Gait is steady. No apraxia or ataxia. Unable to do tandem walking without difficulty.  Reflexes: 1+ and symmetric. Toes downgoing.     ASSESSMENT: 35 male with a vascular dementia following left temporal intercerebral hemorrhage in 2007 from warfarin related coagulopathy and atrial fibrillation. Recent progression of dementia and may be related to superimposed Alzheimer's.    PLAN: Trial of Namenda for worsening vascular dementia. Start Namenda excess 7 mg daily for one week followed by 14 mg daily for one week then 21 mg and 28 mg once daily if tolerated. Continue  Aricept 10 mg daily. Return for followup in 2 months with Heide Guile,, NP

## 2013-07-18 ENCOUNTER — Telehealth: Payer: Self-pay

## 2013-07-18 NOTE — Telephone Encounter (Signed)
Tonya from [facility] called, left message stating they have approve the Prior Auth Request we submitted for Namenda XR.  Approval is valid for one year.  They have notified the patient and will mail [place] the approval letter.

## 2013-07-20 ENCOUNTER — Encounter: Payer: Self-pay | Admitting: Cardiovascular Disease

## 2013-07-23 ENCOUNTER — Other Ambulatory Visit: Payer: Self-pay

## 2013-07-23 MED ORDER — MEMANTINE HCL ER 28 MG PO CP24: 28.0000 mg | ORAL_CAPSULE | Freq: Every day | ORAL | Status: DC

## 2013-07-26 ENCOUNTER — Encounter: Payer: Self-pay | Admitting: *Deleted

## 2013-07-28 ENCOUNTER — Other Ambulatory Visit: Payer: Self-pay | Admitting: Cardiovascular Disease

## 2013-07-28 DIAGNOSIS — I495 Sick sinus syndrome: Secondary | ICD-10-CM

## 2013-07-28 DIAGNOSIS — I4891 Unspecified atrial fibrillation: Secondary | ICD-10-CM

## 2013-07-28 DIAGNOSIS — I428 Other cardiomyopathies: Secondary | ICD-10-CM

## 2013-07-28 LAB — ICD DEVICE OBSERVATION

## 2013-07-30 ENCOUNTER — Telehealth: Payer: Self-pay | Admitting: Neurology

## 2013-07-31 NOTE — Telephone Encounter (Signed)
Called patient wife and let her know to hold the patients namenda 28MG  Until  Tomorrow 08-01-2013. And to start giving him one pill a day from that point on.

## 2013-08-01 LAB — REMOTE ICD DEVICE
AL IMPEDENCE ICD: 361 Ohm
BATTERY VOLTAGE: 3.15 V
LV LEAD IMPEDENCE ICD: 608 Ohm
MODE SWITCH EPISODES: 1
RV LEAD AMPLITUDE: 8.4 mv
RV LEAD THRESHOLD: 1.125 V
TZAT-0004SLOWVT: 8
TZAT-0011SLOWVT: 10 ms
TZAT-0013SLOWVT: 2
TZON-0004VSLOWVT: 24
TZST-0001SLOWVT: 3
TZST-0001SLOWVT: 6
TZST-0003SLOWVT: 35 J
VENTRICULAR PACING ICD: 100 pct

## 2013-08-16 ENCOUNTER — Other Ambulatory Visit: Payer: Self-pay

## 2013-08-16 MED ORDER — MEMANTINE HCL ER 28 MG PO CP24: 28.0000 mg | ORAL_CAPSULE | Freq: Every day | ORAL | Status: DC

## 2013-08-23 ENCOUNTER — Encounter: Payer: Self-pay | Admitting: *Deleted

## 2013-08-23 LAB — REMOTE ICD DEVICE
AL AMPLITUDE: 1.6 mv
AL IMPEDENCE ICD: 361 Ohm
LV LEAD IMPEDENCE ICD: 532 Ohm
LV LEAD THRESHOLD: 1.625 V
RV LEAD AMPLITUDE: 5.5 mv
RV LEAD IMPEDENCE ICD: 361 Ohm
RV LEAD THRESHOLD: 1.375 V
TZAT-0004SLOWVT: 8
TZAT-0011SLOWVT: 10 ms
TZAT-0013SLOWVT: 2
TZAT-0018SLOWVT: NEGATIVE
TZON-0004SLOWVT: 20
TZON-0004VSLOWVT: 24
TZST-0001SLOWVT: 3
TZST-0001SLOWVT: 4
TZST-0001SLOWVT: 6
TZST-0003SLOWVT: 35 J
TZST-0003SLOWVT: 35 J
VENTRICULAR PACING ICD: 100 pct
VF: 0

## 2013-09-02 DIAGNOSIS — I4891 Unspecified atrial fibrillation: Secondary | ICD-10-CM

## 2013-09-02 DIAGNOSIS — I509 Heart failure, unspecified: Secondary | ICD-10-CM

## 2013-09-02 DIAGNOSIS — I428 Other cardiomyopathies: Secondary | ICD-10-CM

## 2013-09-02 LAB — ICD DEVICE OBSERVATION

## 2013-09-03 ENCOUNTER — Ambulatory Visit: Payer: Medicare Other

## 2013-09-05 ENCOUNTER — Encounter: Payer: Self-pay | Admitting: Nurse Practitioner

## 2013-09-05 ENCOUNTER — Ambulatory Visit (INDEPENDENT_AMBULATORY_CARE_PROVIDER_SITE_OTHER): Payer: Medicare Other | Admitting: Nurse Practitioner

## 2013-09-05 VITALS — BP 125/69 | HR 96 | Temp 98.5°F | Ht 66.0 in | Wt 174.0 lb

## 2013-09-05 DIAGNOSIS — F0390 Unspecified dementia without behavioral disturbance: Secondary | ICD-10-CM

## 2013-09-05 NOTE — Patient Instructions (Signed)
Discontinue Namenda by giving 1 pill once a day for 1 week then stop.  Continue Aricept.  Follow up in 6 months, sooner as needed.

## 2013-09-05 NOTE — Progress Notes (Signed)
[facility]  PATIENT: Troy Warren DOB: [birth year]   REASON FOR VISIT: follow up HISTORY FROM: patient  HISTORY OF PRESENT ILLNESS: 10 male with a vascular dementia following left temporal intercerebral hemorrhage in 2007 from warfarin related coagulopathy and atrial fibrillation.   07/05/2013 He returns for followup today after last visit on 05/15/2012. Is accompanied by his wife who feel that she has noticed worsening of his memory and cognitive abilities. He is cannot remember at times his name several familiar places and needs more help with activities of daily living. Is still able to walk quite well and has good balance and has not had any falls. Does not have any delusions or hallucinations her concerns about agitation her safety. He is tolerating Aricept 10 mg daily without any side effects. He has not yet tried Namenda in the past. Wife state that she is quite comfortable taking care of him at home with some help from her daughter. On Mini-Mental status exam testing today his score 9/30 which is a 5 point decline from 14/30 at last visit one year ago.  UPDATE 09/05/13 (LL):  Mr. Lusk returns with his wife for 2 month follow up after adding Namenda.  Patient states he is feeling good, no complaints.  Wife and daughter do not see any difference since starting Namenda except they feel he becomes agitated more often and may have more confusion.  They would like to stop the medication.  MMSE score today is 9/30, which is the same since last visit 2 months ago.  ROS:  14 system review of systems is positive for weight gain, loss of vision, shortness of breath, hearing loss, diarrhea,diarrhea, memory loss  ALLERGIES: No Known Allergies  HOME MEDICATIONS: Outpatient Prescriptions Prior to Visit  Medication Sig Dispense Refill  . ALPRAZolam (XANAX) 1 MG tablet Take 0.5 mg by mouth at bedtime. Take every night per wife      . aspirin 81 MG tablet Take 81 mg by  mouth at bedtime.      . citalopram (CELEXA) 40 MG tablet Take 40 mg by mouth daily.      . Cyanocobalamin (VITAMIN B-12 PO) Take 1 tablet by mouth daily.      Marland Kitchen diltiazem (CARDIZEM) 120 MG tablet Take 120 mg by mouth 2 (two) times daily.      . diphenoxylate-atropine (LOMOTIL) 2.5-0.025 MG per tablet       . donepezil (ARICEPT) 10 MG tablet TAKE 1 TABLET BY MOUTH EVERY DAY  30 tablet  0  . dorzolamide-timolol (COSOPT) 22.3-6.8 MG/ML ophthalmic solution       . ferrous fumarate (HEMOCYTE - 106 MG FE) 325 (106 FE) MG TABS Take 1 tablet by mouth 2 (two) times daily.      . furosemide (LASIX) 40 MG tablet Take 1 tablet (40 mg total) by mouth daily.  30 tablet  11  . insulin glargine (LANTUS) 100 UNIT/ML injection Inject 30 Units into the skin at bedtime.      . isosorbide mononitrate (IMDUR) 60 MG 24 hr tablet Take 60 mg by mouth 2 (two) times daily. Takes twice daily per wife      . labetalol (NORMODYNE) 200 MG tablet Take 200 mg by mouth 2 (two) times daily.      Marland Kitchen levothyroxine (SYNTHROID, LEVOTHROID) 75 MCG tablet Take 75 mcg by mouth daily.      Marland Kitchen losartan (COZAAR) 100 MG tablet       . meloxicam (MOBIC) 15 MG tablet       .  Multiple Vitamins-Minerals (ICAPS) CAPS Take 1 capsule by mouth daily.      . potassium chloride (K-DUR) 10 MEQ tablet Take 10 mEq by mouth 2 (two) times daily.      . pravastatin (PRAVACHOL) 40 MG tablet Take 40 mg by mouth daily.      . Memantine HCl ER (NAMENDA XR) 28 MG CP24 Take 28 mg by mouth daily. After 1 month titration completed.  If this medication is still on backorder, please dispense Namenda 10mg  BID #60 + 5 Refills instead.  Thank you.  30 capsule  5   No facility-administered medications prior to visit.    PAST MEDICAL HISTORY: Past Medical History  Diagnosis Date  . Hypertension   . Heart disease   . Hyperlipemia   . Depression   . Testicular cancer   . Diabetes mellitus without complication   . Polio     child polio  . Rheumatic fever   .  Mild dementia   . Mild dementia     PAST SURGICAL HISTORY: No past surgical history on file.  FAMILY HISTORY: No family history on file.  SOCIAL HISTORY: History   Social History  . Marital Status: Married    Spouse Name: N/A    Number of Children: 0  . Years of Education: N/A   Occupational History  .     Social History Main Topics  . Smoking status: Former Games developer  . Smokeless tobacco: Not on file  . Alcohol Use: No  . Drug Use: No  . Sexual Activity: Not on file   Other Topics Concern  . Not on file   Social History Narrative  . No narrative on file   PHYSICAL EXAM  Filed Vitals:   09/05/13 1327  BP: 125/69  Pulse: 96  Temp: 98.5 F (36.9 C)  Height: 5\' 6"  (1.676 m)  Weight: 174 lb (78.926 kg)   Body mass index is 28.1 kg/(m^2).  Physical Exam  General: well developed, well nourished, seated, in no evident distress  Head: head normocephalic and atraumatic. Orohparynx benign  Neck: supple with no carotid or supraclavicular bruits  Cardiovascular: regular rate and rhythm, no murmurs  Musculoskeletal: no deformity  Skin: no rash/petichiae  Vascular: Normal pulses all extremities   Neurologic Exam  Mental Status: Awake and fully alert. Disoriented to place and time. Recent and remote memory diminished. Attention span, concentration and fund of knowledge diminishedte. Mood and affect appropriate. MMSE 9/30 ( down from 15/30 last visit).Animal Naming test 6 only.  Cranial Nerves: Fundoscopic exam not done Pupils equal, briskly reactive to light. Extraocular movements full without nystagmus. Visual fields full to confrontation. Hearing intact. Facial sensation intact. Face, tongue, palate moves normally and symmetrically.  Motor: Normal bulk and tone. Normal strength in all tested extremity muscles.  Sensory.: intact to tough and pinprick and vibratory.  Coordination: Rapid alternating movements normal in all extremities. Finger-to-nose and heel-to-shin  performed accurately bilaterally.  Gait and Station: Arises from chair without difficulty. Gait is steady. No apraxia or ataxia. Unable to do tandem walking without difficulty.  Reflexes: 1+ and symmetric. Toes downgoing.   DIAGNOSTIC DATA (LABS, IMAGING, TESTING) - I reviewed patient records, labs, notes, testing and imaging myself where available.   ASSESSMENT AND PLAN 75 male with a vascular dementia following left temporal intercerebral hemorrhage in 2007 from warfarin related coagulopathy and atrial fibrillation. Recent progression of dementia and may be related to superimposed Alzheimer's.   Continue Aricept 10 mg daily.  No benefit  with Namenda and family feels there is increased agitation.  Decrease to 1 daily for 1 week, then stop.  Pharmacy did not have Namenda XR so they were given the BID formula. Provided family with resources list for seniors in [place]. Return for followup in 6 months with Heide Guile, NP, sooner as needed.  Medications Discontinued During This Encounter  Medication Reason  . Memantine HCl ER (NAMENDA XR) 28 MG CP24 Discontinued by provider   Ronal Fear, MSN, NP-C 09/05/2013, 2:26 PM [facility] [address] [place], [place] [address] [phone]

## 2013-09-08 ENCOUNTER — Encounter: Payer: Self-pay | Admitting: *Deleted

## 2013-09-08 LAB — REMOTE ICD DEVICE
BATTERY VOLTAGE: 3.14 V
FVT: 0
LV LEAD IMPEDENCE ICD: 608 Ohm
RV LEAD AMPLITUDE: 6.9 mv
RV LEAD IMPEDENCE ICD: 399 Ohm
TZAT-0001SLOWVT: 1
TZAT-0011SLOWVT: 10 ms
TZON-0003SLOWVT: 340.9 ms
TZON-0003VSLOWVT: 379.7 ms
TZON-0004VSLOWVT: 24
TZON-0005SLOWVT: 12
TZST-0001SLOWVT: 2
TZST-0001SLOWVT: 3
TZST-0001SLOWVT: 5
TZST-0003SLOWVT: 20 J
TZST-0003SLOWVT: 35 J
TZST-0003SLOWVT: 35 J
VENTRICULAR PACING ICD: 100 pct
VF: 0

## 2013-09-14 ENCOUNTER — Other Ambulatory Visit: Payer: Self-pay

## 2013-09-14 MED ORDER — DONEPEZIL HCL 10 MG PO TABS: 10.0000 mg | ORAL_TABLET | Freq: Every day | ORAL | Status: DC

## 2013-10-07 LAB — ICD DEVICE OBSERVATION

## 2013-10-08 ENCOUNTER — Ambulatory Visit (INDEPENDENT_AMBULATORY_CARE_PROVIDER_SITE_OTHER): Payer: Medicare Other

## 2013-10-08 DIAGNOSIS — I428 Other cardiomyopathies: Secondary | ICD-10-CM

## 2013-10-08 DIAGNOSIS — I509 Heart failure, unspecified: Secondary | ICD-10-CM

## 2013-10-08 DIAGNOSIS — I4891 Unspecified atrial fibrillation: Secondary | ICD-10-CM

## 2013-10-15 ENCOUNTER — Encounter: Payer: Self-pay | Admitting: *Deleted

## 2013-10-15 LAB — REMOTE ICD DEVICE
AL AMPLITUDE: 1 mv
BATTERY VOLTAGE: 3.1295 V
FVT: 0
LV LEAD IMPEDENCE ICD: 589 Ohm
LV LEAD THRESHOLD: 2.125 V
RV LEAD THRESHOLD: 1.375 V
TOT-0006: 20131115000000
TZAT-0001ATACH: 1
TZAT-0001ATACH: 2
TZAT-0004SLOWVT: 8
TZAT-0005SLOWVT: 88 pct
TZAT-0011SLOWVT: 10 ms
TZAT-0012ATACH: 150 ms
TZAT-0012ATACH: 150 ms
TZAT-0012ATACH: 150 ms
TZAT-0012FASTVT: 170 ms
TZAT-0012SLOWVT: 170 ms
TZAT-0018ATACH: NEGATIVE
TZAT-0018ATACH: NEGATIVE
TZAT-0018FASTVT: NEGATIVE
TZAT-0019ATACH: 6 V
TZAT-0020ATACH: 1.5 ms
TZAT-0020ATACH: 1.5 ms
TZAT-0020FASTVT: 1.5 ms
TZAT-0020SLOWVT: 1.5 ms
TZON-0003ATACH: 350 ms
TZON-0003SLOWVT: 340 ms
TZON-0003VSLOWVT: 380 ms
TZON-0004VSLOWVT: 24
TZST-0001ATACH: 4
TZST-0001FASTVT: 2
TZST-0001FASTVT: 6
TZST-0001SLOWVT: 3
TZST-0001SLOWVT: 4
TZST-0002ATACH: NEGATIVE
TZST-0002FASTVT: NEGATIVE
TZST-0002FASTVT: NEGATIVE
TZST-0003SLOWVT: 20 J
TZST-0003SLOWVT: 35 J
VENTRICULAR PACING ICD: 83.09 pct
VF: 0

## 2013-10-24 ENCOUNTER — Other Ambulatory Visit: Payer: Self-pay | Admitting: Internal Medicine

## 2013-10-25 ENCOUNTER — Ambulatory Visit: Payer: Medicare Other

## 2013-10-25 DIAGNOSIS — Z23 Encounter for immunization: Secondary | ICD-10-CM

## 2013-10-29 ENCOUNTER — Other Ambulatory Visit: Payer: Self-pay | Admitting: Internal Medicine

## 2013-11-09 ENCOUNTER — Ambulatory Visit (INDEPENDENT_AMBULATORY_CARE_PROVIDER_SITE_OTHER): Payer: Medicare Other | Admitting: *Deleted

## 2013-11-09 DIAGNOSIS — I509 Heart failure, unspecified: Secondary | ICD-10-CM

## 2013-11-09 DIAGNOSIS — I4891 Unspecified atrial fibrillation: Secondary | ICD-10-CM

## 2013-11-09 DIAGNOSIS — I428 Other cardiomyopathies: Secondary | ICD-10-CM

## 2013-11-09 LAB — ICD DEVICE OBSERVATION

## 2013-11-23 ENCOUNTER — Encounter: Payer: Medicare Other | Admitting: Cardiovascular Disease

## 2013-11-27 ENCOUNTER — Encounter: Payer: Self-pay | Admitting: *Deleted

## 2013-11-27 LAB — MDC_IDC_ENUM_SESS_TYPE_REMOTE
Battery Voltage: 3.11 V
HighPow Impedance: 43 Ohm
Lead Channel Impedance Value: 342 Ohm
Lead Channel Pacing Threshold Amplitude: 1.25 V
Lead Channel Pacing Threshold Pulse Width: 1 ms
Lead Channel Sensing Intrinsic Amplitude: 5.4 mV
Lead Channel Setting Pacing Amplitude: 2.5 V
Zone Setting Detection Interval: 300 ms
Zone Setting Detection Interval: 340 ms
Zone Setting Detection Interval: 350 ms

## 2013-12-03 ENCOUNTER — Telehealth: Payer: Self-pay

## 2013-12-03 NOTE — Telephone Encounter (Signed)
Return call to patient , wife called today states that patient has had a cold and his ears fill as though they are "clogged" up, per Marchelle Folks advised to try Zyrtec at night, Nasacort nasal spray , advised if not feeling any better to call office to schedule appt

## 2013-12-15 ENCOUNTER — Encounter: Payer: Self-pay | Admitting: *Deleted

## 2013-12-17 ENCOUNTER — Other Ambulatory Visit: Payer: Self-pay | Admitting: Internal Medicine

## 2013-12-18 ENCOUNTER — Encounter: Payer: Medicare Other | Admitting: Cardiovascular Disease

## 2013-12-18 NOTE — Telephone Encounter (Signed)
Called pt scheduled Appt 01/02/14 with Dr Crissie Sickles with Pts wife

## 2013-12-24 ENCOUNTER — Encounter: Payer: Self-pay | Admitting: Nurse Practitioner

## 2013-12-24 ENCOUNTER — Telehealth: Payer: Self-pay | Admitting: Nurse Practitioner

## 2013-12-24 ENCOUNTER — Ambulatory Visit (INDEPENDENT_AMBULATORY_CARE_PROVIDER_SITE_OTHER): Payer: Medicare Other | Admitting: Emergency Medicine

## 2013-12-24 ENCOUNTER — Encounter: Payer: Self-pay | Admitting: Emergency Medicine

## 2013-12-24 ENCOUNTER — Other Ambulatory Visit: Payer: Self-pay | Admitting: Nurse Practitioner

## 2013-12-24 VITALS — BP 118/70 | HR 72 | Temp 98.0°F | Resp 16 | Ht 66.5 in | Wt 169.0 lb

## 2013-12-24 DIAGNOSIS — I259 Chronic ischemic heart disease, unspecified: Secondary | ICD-10-CM

## 2013-12-24 DIAGNOSIS — I2510 Atherosclerotic heart disease of native coronary artery without angina pectoris: Secondary | ICD-10-CM

## 2013-12-24 DIAGNOSIS — R569 Unspecified convulsions: Secondary | ICD-10-CM

## 2013-12-24 DIAGNOSIS — F32A Depression, unspecified: Secondary | ICD-10-CM | POA: Insufficient documentation

## 2013-12-24 DIAGNOSIS — R350 Frequency of micturition: Secondary | ICD-10-CM

## 2013-12-24 DIAGNOSIS — E119 Type 2 diabetes mellitus without complications: Secondary | ICD-10-CM

## 2013-12-24 DIAGNOSIS — E782 Mixed hyperlipidemia: Secondary | ICD-10-CM

## 2013-12-24 DIAGNOSIS — R5381 Other malaise: Secondary | ICD-10-CM

## 2013-12-24 DIAGNOSIS — F329 Major depressive disorder, single episode, unspecified: Secondary | ICD-10-CM

## 2013-12-24 DIAGNOSIS — E785 Hyperlipidemia, unspecified: Secondary | ICD-10-CM

## 2013-12-24 DIAGNOSIS — F05 Delirium due to known physiological condition: Secondary | ICD-10-CM

## 2013-12-24 DIAGNOSIS — F0390 Unspecified dementia without behavioral disturbance: Secondary | ICD-10-CM

## 2013-12-24 DIAGNOSIS — C6290 Malignant neoplasm of unspecified testis, unspecified whether descended or undescended: Secondary | ICD-10-CM | POA: Insufficient documentation

## 2013-12-24 DIAGNOSIS — F03A0 Unspecified dementia, mild, without behavioral disturbance, psychotic disturbance, mood disturbance, and anxiety: Secondary | ICD-10-CM

## 2013-12-24 DIAGNOSIS — R5383 Other fatigue: Secondary | ICD-10-CM

## 2013-12-24 DIAGNOSIS — I10 Essential (primary) hypertension: Secondary | ICD-10-CM | POA: Insufficient documentation

## 2013-12-24 DIAGNOSIS — R7309 Other abnormal glucose: Secondary | ICD-10-CM

## 2013-12-24 LAB — CBC WITH DIFFERENTIAL/PLATELET
BASOS ABS: 0 10*3/uL (ref 0.0–0.1)
Basophils Relative: 0 % (ref 0–1)
EOS PCT: 2 % (ref 0–5)
Eosinophils Absolute: 0.2 10*3/uL (ref 0.0–0.7)
HEMATOCRIT: 43.5 % (ref 39.0–52.0)
HEMOGLOBIN: 15 g/dL (ref 13.0–17.0)
LYMPHS ABS: 1.4 10*3/uL (ref 0.7–4.0)
LYMPHS PCT: 15 % (ref 12–46)
MCH: 30.9 pg (ref 26.0–34.0)
MCHC: 34.5 g/dL (ref 30.0–36.0)
MCV: 89.5 fL (ref 78.0–100.0)
MONO ABS: 0.7 10*3/uL (ref 0.1–1.0)
Monocytes Relative: 8 % (ref 3–12)
NEUTROS ABS: 6.8 10*3/uL (ref 1.7–7.7)
Neutrophils Relative %: 75 % (ref 43–77)
Platelets: 160 10*3/uL (ref 150–400)
RBC: 4.86 MIL/uL (ref 4.22–5.81)
RDW: 15 % (ref 11.5–15.5)
WBC: 9.1 10*3/uL (ref 4.0–10.5)

## 2013-12-24 LAB — HEMOGLOBIN A1C
Hgb A1c MFr Bld: 7.8 % — ABNORMAL HIGH (ref <5.7)
MEAN PLASMA GLUCOSE: 177 mg/dL — AB (ref <117)

## 2013-12-24 LAB — HEPATIC FUNCTION PANEL
ALBUMIN: 4 g/dL (ref 3.5–5.2)
AST: 12 U/L (ref 0–37)
Alkaline Phosphatase: 85 U/L (ref 39–117)
BILIRUBIN DIRECT: 0.2 mg/dL (ref 0.0–0.3)
Indirect Bilirubin: 0.8 mg/dL (ref 0.0–0.9)
TOTAL PROTEIN: 6.3 g/dL (ref 6.0–8.3)
Total Bilirubin: 1 mg/dL (ref 0.3–1.2)

## 2013-12-24 LAB — LIPID PANEL
CHOLESTEROL: 149 mg/dL (ref 0–200)
HDL: 57 mg/dL (ref >39)
LDL Cholesterol: 66 mg/dL (ref 0–99)
TRIGLYCERIDES: 128 mg/dL (ref <150)
Total CHOL/HDL Ratio: 2.6 Ratio
VLDL: 26 mg/dL (ref 0–40)

## 2013-12-24 LAB — BASIC METABOLIC PANEL WITH GFR
BUN: 19 mg/dL (ref 6–23)
CHLORIDE: 101 meq/L (ref 96–112)
CO2: 28 meq/L (ref 19–32)
CREATININE: 1.32 mg/dL (ref 0.50–1.35)
Calcium: 9.4 mg/dL (ref 8.4–10.5)
GFR, Est African American: 58 mL/min — ABNORMAL LOW
GFR, Est Non African American: 51 mL/min — ABNORMAL LOW
GLUCOSE: 230 mg/dL — AB (ref 70–99)
POTASSIUM: 4.2 meq/L (ref 3.5–5.3)
Sodium: 139 mEq/L (ref 135–145)

## 2013-12-24 MED ORDER — LEVOFLOXACIN 500 MG PO TABS: 500.0000 mg | ORAL_TABLET | Freq: Every day | ORAL | Status: AC

## 2013-12-24 NOTE — Telephone Encounter (Signed)
Seems like he may had a seizure. Schedule a EEG and appointment with Charlott Holler

## 2013-12-24 NOTE — Patient Instructions (Signed)
Confusion °Confusion is the inability to think with your usual speed or clarity. Confusion may come on quickly or slowly over time. How quickly the confusion comes on depends on the cause. Confusion can be due to any number of causes. °CAUSES  °· Concussion, head injury, or head trauma. °· Seizures. °· Stroke. °· Fever. °· Senility. °· Heightened emotional states like rage or terror. °· Mental illness in which the person loses the ability to determine what is real and what is not (hallucinations). °· Infections. °· Toxic effects from alcohol, drugs, or prescription medicines. °· Dehydration and an imbalance of salts in the body (electrolytes). °· Lack of sleep. °· Low blood sugar (diabetes). °· Low levels of oxygen (for example from chronic lung disorders). °· Drug interactions or other medication side effects. °· Nutritional deficiencies, especially niacin, thiamine, vitamin C, or vitamin B. °· Sudden drop in body temperature (hypothermia). °· Illness in the elderly. Constipation can result in confusion. An elderly person who is hospitalized may become confused due to change in daily routine. °SYMPTOMS  °People often describe their thinking as cloudy or unclear when they are confused. Confusion can also include feeling disoriented. That means you are unaware of where or who you are. You may also not know what the date or time is. If confused, you may also have difficulty paying attention, remembering and making decisions. Some people also act aggressively when they are confused.  °DIAGNOSIS  °The medical evaluation of confusion may include: °· Blood and urine tests. °· X-rays. °· Brain and nervous system tests. °· Analyzing your brain waves (electroencphalogram or EEG). °· A special X-ray (MRI) of your head or other special studies. °Your physician will ask questions such as: °· Do you get days and nights mixed up? °· Are you awake during regular sleep times? °· Do you have trouble recognizing people? °· Do you  know where you are? °· Do you know the date and time? °· Does the confusion come and go? °· Is the confusion quickly getting worse? °· Has there been a recent illness? °· Has there been a recent head injury? °· Are you diabetic? °· Do you have a lung disorder? °· What medication are you taking? °· Have you taken drugs or alcohol? °TREATMENT  °An admission to the hospital may not be needed, but a confused person should not be left alone. Stay with a family member or friend until the confusion clears. Avoid alcohol, pain relievers or sedative drugs until you have fully recovered. Do not drive until your caregiver says it is okay. °HOME CARE INSTRUCTIONS °What family and friends can do: °· To find out if someone is confused ask him or her their name, age, and the date. If the person is unsure or answers incorrectly, he or she is confused. °· Always introduce yourself, no matter how well the person knows you. °· Often remind the person of his or her location. °· Place a calendar and clock near the confused person. °· Talk about current events and plans for the day. °· Try to keep the environment calm, quiet and peaceful. °· Make sure the patient keeps follow up appointments with their physician. °PREVENTION  °Ways to prevent confusion: °· Avoid alcohol. °· Eat a balanced diet. °· Get enough sleep. °· Do not become isolated. Spend time with other people and make plans for your days. °· Keep careful watch on your blood sugar levels if you are diabetic. °SEEK IMMEDIATE MEDICAL CARE IF:  °· You develop   severe headaches, repeated vomiting, seizures, blackouts or slurred speech. °· There is increasing confusion, weakness, numbness, restlessness or personality changes. °· You develop a loss of balance, have marked dizziness, feel uncoordinated or fall. °· You have delusions, hallucinations or develop severe anxiety. °· Your family members think you need to be rechecked. °Document Released: 01/06/2005 Document Revised:  02/21/2012 Document Reviewed: 09/03/2008 °ExitCare® Patient Information ©2014 [employer]. ° °

## 2013-12-24 NOTE — Telephone Encounter (Signed)
Spoke with wife and she said that patient episode lasted all day/night,could not hear and it seemed as though he was picking something off his pants

## 2013-12-24 NOTE — Telephone Encounter (Signed)
Of appt changed from 3/30 to 3/27 also mailed letter

## 2013-12-24 NOTE — Telephone Encounter (Signed)
PT HAVING PROBLEMS-WIFE STATES YESTERDAY UNABLE TO UNDERSTAND HIM WHEN HE TALKS--PLEASE CALL

## 2013-12-24 NOTE — Progress Notes (Signed)
Subjective:    Patient ID: Troy Warren, male    DOB: 09-01-1931, 80 y.o.   MRN: [id number]  HPI Comments: 80 yo male presents for 3 month F/U for HTN, Cholesterol, Pre-Dm, D. Deficient. He is not exercising. He is eating better but family feels he has lost weight. Weight stable with last visit at office. LAST LABS T 136 TG 141 L 56 A1C 6.1  D 58 HE HAD GI EVALUATION WITH LOW hgb AND IRON AFTER last OV with fatigue, with GI declining to do colonoscopy due to age. He is not exercising or drinking water AD.  He has had increased urine frequency x 2 days and yesterday had extreme confusion with garbled speech but has returned to normal today. Family denies any noticeable weakness. He has been sleeping more than usual.   Current Outpatient Prescriptions on File Prior to Visit  Medication Sig Dispense Refill  . ALPRAZolam (XANAX) 1 MG tablet Take 0.5 mg by mouth at bedtime. Take every night per wife      . aspirin 81 MG tablet Take 81 mg by mouth at bedtime.      . citalopram (CELEXA) 40 MG tablet Take 40 mg by mouth. 1/2 daily = 20  mg      . Cyanocobalamin (VITAMIN B-12 PO) Take 1 tablet by mouth daily.      Marland Kitchen diltiazem (CARDIZEM) 120 MG tablet Take 120 mg by mouth 2 (two) times daily.      Marland Kitchen donepezil (ARICEPT) 10 MG tablet Take 1 tablet (10 mg total) by mouth daily.  30 tablet  6  . dorzolamide-timolol (COSOPT) 22.3-6.8 MG/ML ophthalmic solution       . ferrous fumarate (HEMOCYTE - 106 MG FE) 325 (106 FE) MG TABS Take 1 tablet by mouth 2 (two) times daily.      . furosemide (LASIX) 40 MG tablet Take 1 tablet (40 mg total) by mouth daily.  30 tablet  11  . insulin glargine (LANTUS) 100 UNIT/ML injection Inject 30 Units into the skin at bedtime.      . isosorbide mononitrate (IMDUR) 60 MG 24 hr tablet Take 60 mg by mouth 2 (two) times daily. Takes twice daily per wife      . KLOR-CON 10 10 MEQ tablet TAKE 1 TABLET TWICE DAILY  60 tablet  0  . labetalol (NORMODYNE) 200 MG tablet Take 200  mg by mouth 2 (two) times daily.      Marland Kitchen levothyroxine (SYNTHROID, LEVOTHROID) 75 MCG tablet TAKE 1 TABLET EVERY DAY  90 tablet  3  . losartan (COZAAR) 100 MG tablet       . Multiple Vitamins-Minerals (ICAPS) CAPS Take 1 capsule by mouth daily.      . potassium chloride (K-DUR) 10 MEQ tablet Take 10 mEq by mouth 2 (two) times daily.      . pravastatin (PRAVACHOL) 40 MG tablet Take 40 mg by mouth daily.       No current facility-administered medications on file prior to visit.    Lipitor  Past Medical History  Diagnosis Date  . Hypertension   . Ischemic heart disease   . Hyperlipemia   . Depression   . Testicular cancer   . Diabetes mellitus without complication   . Polio     child polio  . Rheumatic fever   . Mild dementia   . Mild dementia   . Systolic heart failure     acute on chronic  . CAD (coronary artery disease)   .  Chronic atrial fibrillation       Review of Systems  Constitutional: Positive for fatigue.  Genitourinary: Positive for frequency.  Psychiatric/Behavioral: Positive for confusion.   BP 118/70  Pulse 72  Temp(Src) 98 F (36.7 C) (Temporal)  Resp 16  Ht 5' 6.5" (1.689 m)  Wt 169 lb (76.658 kg)  BMI 26.87 kg/m2      Objective:   Physical Exam  Nursing note and vitals reviewed. Constitutional: He is oriented to person, place, and time. He appears well-developed and well-nourished.  HENT:  Head: Normocephalic and atraumatic.  Right Ear: External ear normal.  Left Ear: External ear normal.  Nose: Nose normal.  Eyes: Conjunctivae and EOM are normal.  Neck: Normal range of motion. Neck supple. No JVD present. No thyromegaly present.  Cardiovascular: Normal rate, regular rhythm, normal heart sounds and intact distal pulses.   Pulmonary/Chest: Effort normal and breath sounds normal.  Abdominal: Soft. Bowel sounds are normal. He exhibits no distension and no mass. There is no tenderness. There is no rebound and no guarding.  Musculoskeletal:  Normal range of motion. He exhibits no edema and no tenderness.  Lymphadenopathy:    He has no cervical adenopathy.  Neurological: He is alert and oriented to person, place, and time. He has normal reflexes. No cranial nerve deficit. Coordination normal.  Skin: Skin is warm and dry.  Psychiatric: He has a normal mood and affect. His behavior is normal. Judgment and thought content normal.          Assessment & Plan:  1.  3 month F/U for HTN, Cholesterol, Pre-Dm, D. Deficient. Needs healthy diet, cardio QD and obtain healthy weight. Check Labs, Check BP if >130/80 call office 2. Confusion vs UTI with HX frequency- Check labs, if any sx increase w/c for CT or ER. Advised of dementia exercises to try with mental and physical activity

## 2013-12-25 ENCOUNTER — Ambulatory Visit: Payer: Self-pay | Admitting: Emergency Medicine

## 2013-12-25 LAB — URINALYSIS, ROUTINE W REFLEX MICROSCOPIC
GLUCOSE, UA: 500 mg/dL — AB
HGB URINE DIPSTICK: NEGATIVE
Leukocytes, UA: NEGATIVE
Nitrite: NEGATIVE
PROTEIN: NEGATIVE mg/dL
Specific Gravity, Urine: 1.02 (ref 1.005–1.030)
UROBILINOGEN UA: 0.2 mg/dL (ref 0.0–1.0)
pH: 5 (ref 5.0–8.0)

## 2013-12-25 LAB — TSH: TSH: 2.299 u[IU]/mL (ref 0.350–4.500)

## 2013-12-25 LAB — INSULIN, FASTING: Insulin fasting, serum: 25 u[IU]/mL (ref 3–28)

## 2013-12-25 NOTE — Telephone Encounter (Signed)
EEG/ appt with Ms Chauncy Passy has been scheduled

## 2013-12-26 LAB — URINE CULTURE

## 2014-01-01 DIAGNOSIS — E1029 Type 1 diabetes mellitus with other diabetic kidney complication: Secondary | ICD-10-CM | POA: Insufficient documentation

## 2014-01-01 DIAGNOSIS — E559 Vitamin D deficiency, unspecified: Secondary | ICD-10-CM | POA: Insufficient documentation

## 2014-01-01 DIAGNOSIS — E291 Testicular hypofunction: Secondary | ICD-10-CM | POA: Insufficient documentation

## 2014-01-02 ENCOUNTER — Encounter: Payer: Self-pay | Admitting: Internal Medicine

## 2014-01-02 ENCOUNTER — Ambulatory Visit (INDEPENDENT_AMBULATORY_CARE_PROVIDER_SITE_OTHER): Payer: Medicare Other | Admitting: Physician Assistant

## 2014-01-02 VITALS — BP 122/64 | HR 80 | Temp 97.9°F | Resp 16 | Wt 173.0 lb

## 2014-01-02 DIAGNOSIS — R2689 Other abnormalities of gait and mobility: Secondary | ICD-10-CM

## 2014-01-02 DIAGNOSIS — R269 Unspecified abnormalities of gait and mobility: Secondary | ICD-10-CM

## 2014-01-02 DIAGNOSIS — F05 Delirium due to known physiological condition: Principal | ICD-10-CM

## 2014-01-02 DIAGNOSIS — R410 Disorientation, unspecified: Secondary | ICD-10-CM

## 2014-01-02 DIAGNOSIS — C44310 Basal cell carcinoma of skin of unspecified parts of face: Secondary | ICD-10-CM

## 2014-01-02 NOTE — Progress Notes (Signed)
Subjective:    Patient ID: Troy Warren, male    DOB: 1931/11/06, 80 y.o.   MRN: [id number]  HPI On 12/24/2012 he saw Melissa in the office for a 3 month follow up and at that time he was having frequency, garbled speech for one day and fatigue/sleeping more. He was put on Levaquin for + UTI and has 2 left. His wife and daughter state that he is not having frequency or garbled speech but continues to have fatigue and confusion. He has had some AM sugars in the 70's, lowest was 57. Has follow up appointment with Dr. Mary Sella tomorrow and will be doing an EEG.  Very concerned as well, his speech is better but he has had several incidents with worsening memory. Per his wife up until Sunday when he had the episode of garbled speech he was able to vacuum, check his sugar, take his medicine but while his speech has improved he is unable to do these things without assistance. He has forgotten where his room/clothes, can't check his sugar, and she feels that he is detiorating faster. Feels his balance is worse, has had incontinence.   Last CT head in 2012 IMPRESSION:  1. No acute intracranial abnormality.  2. Similar encephalomalacia in the left temporal lobe.  3. Mild ventriculomegaly, unchanged. Felt to be secondary to  cerebral atrophy.    Current Outpatient Prescriptions on File Prior to Visit  Medication Sig Dispense Refill  . ALPRAZolam (XANAX) 1 MG tablet Take 0.5 mg by mouth at bedtime. Take every night per wife      . aspirin 81 MG tablet Take 81 mg by mouth at bedtime.      . Cholecalciferol (VITAMIN D PO) Take 4,000 Int'l Units by mouth daily.      . citalopram (CELEXA) 40 MG tablet Take 40 mg by mouth. 1/2 daily = 20  mg      . Cyanocobalamin (VITAMIN B-12 PO) Take 1 tablet by mouth daily.      Marland Kitchen diltiazem (CARDIZEM) 120 MG tablet Take 120 mg by mouth 2 (two) times daily.      Marland Kitchen donepezil (ARICEPT) 10 MG tablet Take 1 tablet (10 mg total) by mouth daily.  30 tablet  6  .  dorzolamide-timolol (COSOPT) 22.3-6.8 MG/ML ophthalmic solution       . ferrous fumarate (HEMOCYTE - 106 MG FE) 325 (106 FE) MG TABS Take 1 tablet by mouth 2 (two) times daily.      . furosemide (LASIX) 40 MG tablet Take 1 tablet (40 mg total) by mouth daily.  30 tablet  11  . insulin glargine (LANTUS) 100 UNIT/ML injection Inject 30 Units into the skin at bedtime.      . isosorbide mononitrate (IMDUR) 60 MG 24 hr tablet Take 60 mg by mouth 2 (two) times daily. Takes twice daily per wife      . KLOR-CON 10 10 MEQ tablet TAKE 1 TABLET TWICE DAILY  60 tablet  0  . labetalol (NORMODYNE) 200 MG tablet Take 200 mg by mouth 2 (two) times daily.      Marland Kitchen levofloxacin (LEVAQUIN) 500 MG tablet Take 1 tablet (500 mg total) by mouth daily.  10 tablet  0  . levothyroxine (SYNTHROID, LEVOTHROID) 75 MCG tablet TAKE 1 TABLET EVERY DAY  90 tablet  3  . losartan (COZAAR) 100 MG tablet       . Multiple Vitamins-Minerals (ICAPS) CAPS Take 1 capsule by mouth daily.      Marland Kitchen  pravastatin (PRAVACHOL) 40 MG tablet Take 40 mg by mouth daily.       No current facility-administered medications on file prior to visit.   Past Medical History  Diagnosis Date  . Hypertension   . Ischemic heart disease   . Hyperlipemia   . Depression   . Testicular cancer   . Polio     child polio  . Rheumatic fever   . Mild dementia   . Mild dementia   . Systolic heart failure     acute on chronic  . CAD (coronary artery disease)   . Chronic atrial fibrillation   . Vitamin D deficiency   . Type II or unspecified type diabetes mellitus without mention of complication, not stated as uncontrolled   . Other testicular hypofunction     Review of Systems  Constitutional: Positive for activity change and fatigue. Negative for fever, chills, diaphoresis, appetite change and unexpected weight change.  HENT: Negative.   Respiratory: Negative.   Cardiovascular: Negative.   Gastrointestinal: Negative.   Genitourinary:       Nocturia and  incontinence  Musculoskeletal: Negative.   Skin: Positive for wound (right nares x 1 year).  Neurological: Positive for dizziness, speech difficulty and weakness. Negative for tremors, seizures, syncope, facial asymmetry, light-headedness, numbness and headaches.  Hematological: Negative.   Psychiatric/Behavioral: Positive for confusion.       Objective:   Physical Exam  Constitutional: He appears well-developed and well-nourished.  Eyes: Conjunctivae are normal. Pupils are equal, round, and reactive to light.  Neck: Normal range of motion. Neck supple.  Cardiovascular: Normal rate and regular rhythm.   Pulmonary/Chest: Effort normal and breath sounds normal.  Abdominal: Soft. Bowel sounds are normal. There is no tenderness.  Musculoskeletal: Normal range of motion.  Neurological: He is alert. He has normal strength. He is disoriented. No cranial nerve deficit.  Wide based, shuffling gait  Skin: Skin is warm.  Right nare 53mm ulceration, non healing      Assessment & Plan:  1) UTI- finish levaquin and recheck urine in one month 2) Dementia- increasing confusion, imbalance- continue to see Dr. Mary Sella, we will get a CT scan to rule out masses/NPH/stroke.   Decrease lantus by 5 units and do protein snack at night. - incase he is having low sugars.   I also explained the disease process of dementia and how it is a progressive disease. We will rule out what we can.  3) ? Basal cell right nare- refer to cancer center  Dr. Melford Aase did an examination of the patient as well and agrees with the assessment.

## 2014-01-02 NOTE — Patient Instructions (Signed)
Please decrease your lantus by 5 units Please given him a high protein snack before bed like cheese stick, yogurt.   Dementia Dementia is a general term for problems with brain function. A person with dementia has memory loss and a hard time with at least one other brain function such as thinking, speaking, or problem solving. Dementia can affect social functioning, how you do your job, your mood, or your personality. The changes may be hidden for a long time. The earliest forms of this disease are usually not detected by family or friends. Dementia can be:  Irreversible.  Potentially reversible.  Partially reversible.  Progressive. This means it can get worse over time. CAUSES  Irreversible dementia causes may include:  Degeneration of brain cells (Alzheimer's disease or lewy body dementia).  Multiple small strokes (vascular dementia).  Infection (chronic meningitis or Creutzfelt-Jakob disease).  Frontotemporal dementia. This affects younger people, age 26 to 66, compared to those who have Alzheimer's disease.  Dementia associated with other disorders like Parkinson's disease, Huntington's disease, or HIV-associated dementia. Potentially or partially reversible dementia causes may include:  Medicines.  Metabolic causes such as excessive alcohol intake, vitamin B12 deficiency, or thyroid disease.  Masses or pressure in the brain such as a tumor, blood clot, or hydrocephalus. SYMPTOMS  Symptoms are often hard to detect. Family members or coworkers may not notice them early in the disease process. Different people with dementia may have different symptoms. Symptoms can include:  A hard time with memory, especially recent memory. Long-term memory may not be impaired.  Asking the same question multiple times or forgetting something someone just said.  A hard time speaking your thoughts or finding certain words.  A hard time solving problems or performing familiar tasks (such as  how to use a telephone).  Sudden changes in mood.  Changes in personality, especially increasing moodiness or mistrust.  Depression.  A hard time understanding complex ideas that were never a problem in the past. DIAGNOSIS  There are no specific tests for dementia.   Your caregiver may recommend a thorough evaluation. This is because some forms of dementia can be reversible. The evaluation will likely include a physical exam and getting a detailed history from you and a family member. The history often gives the best clues and suggestions for a diagnosis.  Memory testing may be done. A detailed brain function evaluation called neuropsychologic testing may be helpful.  Lab tests and brain imaging (such as a CT scan or MRI scan) are sometimes important.  Sometimes observation and re-evaluation over time is very helpful. TREATMENT  Treatment depends on the cause.   If the problem is a vitamin deficiency, it may be helped or cured with supplements.  For dementias such as Alzheimer's disease, medicines are available to stabilize or slow the course of the disease. There are no cures for this type of dementia.  Your caregiver can help direct you to groups, organizations, and other caregivers to help with decisions in the care of you or your loved one. HOME CARE INSTRUCTIONS The care of individuals with dementia is varied and dependent upon the progression of the dementia. The following suggestions are intended for the person living with, or caring for, the person with dementia.  Create a safe environment.  Remove the locks on bathroom doors to prevent the person from accidentally locking himself or herself in.  Use childproof latches on kitchen cabinets and any place where cleaning supplies, chemicals, or alcohol are kept.  Use childproof  covers in unused electrical outlets.  Install childproof devices to keep doors and windows secured.  Remove stove knobs or install safety knobs and  an automatic shut-off on the stove.  Lower the temperature on water heaters.  Label medicines and keep them locked up.  Secure knives, lighters, matches, power tools, and guns, and keep these items out of reach.  Keep the house free from clutter. Remove rugs or anything that might contribute to a fall.  Remove objects that might break and hurt the person.  Make sure lighting is good, both inside and outside.  Install grab rails as needed.  Use a monitoring device to alert you to falls or other needs for help.  Reduce confusion.  Keep familiar objects and people around.  Use night lights or dim lights at night.  Label items or areas.  Use reminders, notes, or directions for daily activities or tasks.  Keep a simple, consistent routine for waking, meals, bathing, dressing, and bedtime.  Create a calm, quiet environment.  Place large clocks and calendars prominently.  Display emergency numbers and home address near all telephones.  Use cues to establish different times of the day. An example is to open curtains to let the natural light in during the day.   Use effective communication.  Choose simple words and short sentences.  Use a gentle, calm tone of voice.  Be careful not to interrupt.  If the person is struggling to find a word or communicate a thought, try to provide the word or thought.  Ask one question at a time. Allow the person ample time to answer questions. Repeat the question again if the person does not respond.  Reduce nighttime restlessness.  Provide a comfortable bed.  Have a consistent nighttime routine.  Ensure a regular walking or physical activity schedule. Involve the person in daily activities as much as possible.  Limit napping during the day.  Limit caffeine.  Attend social events that stimulate rather than overwhelm the senses.  Encourage good nutrition and hydration.  Reduce distractions during meal times and snacks.  Avoid  foods that are too hot or too cold.  Monitor chewing and swallowing ability.  Continue with routine vision, hearing, dental, and medical screenings.  Only give over-the-counter or prescription medicines as directed by the caregiver.  Monitor driving abilities. Do not allow the person to drive when safe driving is no longer possible.  Register with an identification program which could provide location assistance in the event of a missing person situation. SEEK MEDICAL CARE IF:   New behavioral problems start such as moodiness, aggressiveness, or seeing things that are not there (hallucinations).  Any new problem with brain function happens. This includes problems with balance, speech, or falling a lot.  Problems with swallowing develop.  Any symptoms of other illness happen. Small changes or worsening in any aspect of brain function can be a sign that the illness is getting worse. It can also be a sign of another medical illness such as infection. Seeing a caregiver right away is important. SEEK IMMEDIATE MEDICAL CARE IF:   A fever develops.  New or worsened confusion develops.  New or worsened sleepiness develops.  Staying awake becomes hard to do. Document Released: 05/25/2001 Document Revised: 02/21/2012 Document Reviewed: 04/26/2011 [facility] Patient Information 2014 [place], [place].

## 2014-01-03 ENCOUNTER — Other Ambulatory Visit (INDEPENDENT_AMBULATORY_CARE_PROVIDER_SITE_OTHER): Payer: Medicare Other

## 2014-01-03 ENCOUNTER — Telehealth: Payer: Self-pay | Admitting: Neurology

## 2014-01-03 ENCOUNTER — Other Ambulatory Visit: Payer: Self-pay | Admitting: Cardiovascular Disease

## 2014-01-03 DIAGNOSIS — F09 Unspecified mental disorder due to known physiological condition: Secondary | ICD-10-CM

## 2014-01-03 DIAGNOSIS — R569 Unspecified convulsions: Secondary | ICD-10-CM

## 2014-01-03 NOTE — Telephone Encounter (Signed)
Pt's daughter called back and stated they just received a call from Mr. [facility] medical doctor and they have set him up with a CT Scan tomorrow.  She wanted to make the doctor aware.

## 2014-01-03 NOTE — Telephone Encounter (Signed)
Patient states that they received a call from Dr. Leonie Man, but I see that you were the last one to see the patient. Please advise.

## 2014-01-03 NOTE — Telephone Encounter (Signed)
Troy Warren was returning call from Dr. Leonie Man.  Please call.

## 2014-01-04 ENCOUNTER — Other Ambulatory Visit: Payer: Self-pay | Admitting: Physician Assistant

## 2014-01-04 ENCOUNTER — Other Ambulatory Visit: Payer: Medicare Other

## 2014-01-04 ENCOUNTER — Ambulatory Visit
Admission: RE | Admit: 2014-01-04 | Discharge: 2014-01-04 | Disposition: A | Discharge status: Home / Self Care | Payer: Medicare Other | Source: Ambulatory Visit | Attending: Physician Assistant | Admitting: Physician Assistant

## 2014-01-04 DIAGNOSIS — R410 Disorientation, unspecified: Secondary | ICD-10-CM

## 2014-01-04 DIAGNOSIS — F05 Delirium due to known physiological condition: Principal | ICD-10-CM

## 2014-01-04 DIAGNOSIS — R2689 Other abnormalities of gait and mobility: Secondary | ICD-10-CM

## 2014-01-04 NOTE — Telephone Encounter (Signed)
Please call daughter.  EEG results do not show seizure activity.   There is bihemispheric slowing which is a nonspecific finding that correlates with patient's diagnosis of dementia.

## 2014-01-04 NOTE — Telephone Encounter (Signed)
Called patient and spoke with patient's wife about the called that we received. Patient's wife stated that the daughter had the doctors mixed up and that her husband needed his EEG results. If Troy Hawking, NP could call the daughter, Troy Warren at [phone] and speak with her concerning the patient's results. I advised the patient's wife that if the patient has any other problems, questions or concerns to call the office. Patient's wife verbalized understanding. Please advise.

## 2014-01-04 NOTE — Telephone Encounter (Signed)
Patient's medical doctor ordered the CT scan; we did not.

## 2014-01-04 NOTE — Telephone Encounter (Signed)
Patient's daughter calling back to touch base with someone about why the CT scan is needed today. Please call her back and advise.

## 2014-01-04 NOTE — Telephone Encounter (Signed)
Spoke with daughter and she wanted the results of patient's EEG report,explained that they were not reviewed by physician as of yet, will call when ready, she verbalized understanding

## 2014-01-07 NOTE — Telephone Encounter (Signed)
Called patient daughter Jackelyn Poling to inform her about the patient's EEG results did not show seizure activity, but there was bihemispheric slowing which is a nonspecific finding that correlates with patient's diagnosis of dementia. Patient's daughter requested that a copy be sent to his PCP. I advised the patient's daughter that if the patient has any other problems, questions or concerns to call the office. Patient's daughter verbalized understanding.

## 2014-01-10 ENCOUNTER — Encounter: Payer: Medicare Other | Admitting: Cardiovascular Disease

## 2014-01-15 ENCOUNTER — Other Ambulatory Visit: Payer: Self-pay | Admitting: Internal Medicine

## 2014-01-21 ENCOUNTER — Encounter: Payer: Self-pay | Admitting: Cardiovascular Disease

## 2014-01-21 ENCOUNTER — Ambulatory Visit (INDEPENDENT_AMBULATORY_CARE_PROVIDER_SITE_OTHER): Payer: Medicare Other | Admitting: Cardiovascular Disease

## 2014-01-21 VITALS — BP 158/62 | HR 77 | Resp 20 | Ht 66.5 in | Wt 179.0 lb

## 2014-01-21 DIAGNOSIS — Z95810 Presence of automatic (implantable) cardiac defibrillator: Secondary | ICD-10-CM

## 2014-01-21 DIAGNOSIS — I4821 Permanent atrial fibrillation: Secondary | ICD-10-CM

## 2014-01-21 DIAGNOSIS — E785 Hyperlipidemia, unspecified: Secondary | ICD-10-CM

## 2014-01-21 DIAGNOSIS — I509 Heart failure, unspecified: Secondary | ICD-10-CM

## 2014-01-21 DIAGNOSIS — I2510 Atherosclerotic heart disease of native coronary artery without angina pectoris: Secondary | ICD-10-CM

## 2014-01-21 DIAGNOSIS — I10 Essential (primary) hypertension: Secondary | ICD-10-CM

## 2014-01-21 DIAGNOSIS — I4891 Unspecified atrial fibrillation: Secondary | ICD-10-CM

## 2014-01-21 DIAGNOSIS — I259 Chronic ischemic heart disease, unspecified: Secondary | ICD-10-CM

## 2014-01-21 HISTORY — DX: Permanent atrial fibrillation: I48.21

## 2014-01-21 HISTORY — DX: Presence of automatic (implantable) cardiac defibrillator: Z95.810

## 2014-01-21 LAB — MDC_IDC_ENUM_SESS_TYPE_INCLINIC
Brady Statistic AP VS Percent: 0 %
Brady Statistic AS VP Percent: 88.2 %
Brady Statistic AS VS Percent: 11.8 %
HighPow Impedance: 40 Ohm
HighPow Impedance: 54 Ohm
Lead Channel Impedance Value: 361 Ohm
Lead Channel Impedance Value: 589 Ohm
Lead Channel Pacing Threshold Amplitude: 1 V
Lead Channel Sensing Intrinsic Amplitude: 9.8 mV
Lead Channel Setting Pacing Amplitude: 2.5 V
Lead Channel Setting Pacing Pulse Width: 0.7 ms
Lead Channel Setting Sensing Sensitivity: 0.3 mV
MDC IDC MSMT BATTERY VOLTAGE: 3.1 V
MDC IDC MSMT LEADCHNL LV PACING THRESHOLD AMPLITUDE: 1.25 V
MDC IDC MSMT LEADCHNL LV PACING THRESHOLD PULSEWIDTH: 1 ms
MDC IDC MSMT LEADCHNL RA IMPEDANCE VALUE: 361 Ohm
MDC IDC MSMT LEADCHNL RA SENSING INTR AMPL: 1.6 mV
MDC IDC MSMT LEADCHNL RV PACING THRESHOLD PULSEWIDTH: 0.7 ms
MDC IDC SET LEADCHNL LV PACING AMPLITUDE: 2.5 V
MDC IDC SET LEADCHNL LV PACING PULSEWIDTH: 1 ms
MDC IDC SET ZONE DETECTION INTERVAL: 300 ms
MDC IDC STAT BRADY AP VP PERCENT: 0 %
Zone Setting Detection Interval: 340 ms
Zone Setting Detection Interval: 350 ms
Zone Setting Detection Interval: 380 ms

## 2014-01-21 LAB — PACEMAKER DEVICE OBSERVATION

## 2014-01-21 MED ORDER — DILTIAZEM HCL 120 MG PO TABS: 120.0000 mg | ORAL_TABLET | Freq: Three times a day (TID) | ORAL | Status: DC

## 2014-01-21 NOTE — Assessment & Plan Note (Addendum)
Likewise asymptomatic but imperfect ventricular rate control leads to the relatively low efficiency of biventricular pacing. Since his LVEF is now normal, recommend increasing the dose of diltiazem to 120 mg 3 times a day (he was only taking this short acting medication twice daily). On aspirin for stroke prophylaxis due to history of intracranial hemorrhage and high risk of bleeding complications. If necessary we will switch his labetalol to carvedilol for improved rate control.

## 2014-01-21 NOTE — Progress Notes (Signed)
Patient ID: Troy Warren, male   DOB: 21-Aug-1931, 80 y.o.   MRN: [id number]      Reason for office visit Defibrillator followup, atrial fibrillation, ischemic cardiomyopathy  I met Troy Warren that I performed his CRT-D generator change out in November of 2013. He was previously a patient of Dr. Aldona Bar and Dr. Georgiann Mccoy, who have both since retired. He has a history of ischemic cardiomyopathy and his left ventricular ejection fraction was severely depressed around 30%. He has permanent atrial fibrillation and underlying left bundle branch block. He had a remarkably positive response to CRT with increased ejection fraction to 60-65% when last assessed by echocardiography. He does not have angina pectoris or dyspnea.  He has had slowly progressive dementia and it is a little challenging to obtain the review of systems from the patient. He is also very hard of hearing. Both his wife and daughter are present and they were very helpful in describing his functional status and symptoms. He seems to be doing quite well.  Interrogation of his defibrillator shows normal device function but rather poor efficiency of biventricular pacing. This occurs only 88% of the time. The background rhythm is atrial fibrillation and all the ventricular sensed episodes appear to be secondary to relatively rapid ventricular rates. Today the underlying ventricular rate is only 45 beats per minute. Lead parameters are excellent. Thoracic impedance readings are not indicative of acute heart failure although recently they have been slowly rising Optivol readings.  He is not receiving anticoagulant therapy secondary to left temporal intracerebral hemorrhage in 2007, while on warfarin. He does not have a history of stroke or TIA, but is suspected to have vascular dementia. He is on aspirin therapy.  The family states that his blood pressure is always elevated at home with systolic blood pressure usually in the  155-160 mm Hg range. A comparable blood pressures recorded here today.   Allergies  Allergen Reactions  . Lipitor [Atorvastatin]     weakness    Current Outpatient Prescriptions  Medication Sig Dispense Refill  . ALPRAZolam (XANAX) 1 MG tablet Take 0.5 mg by mouth at bedtime. Take every night per wife      . aspirin 81 MG tablet Take 81 mg by mouth at bedtime.      . Cholecalciferol (VITAMIN D PO) Take 4,000 Int'l Units by mouth daily.      . citalopram (CELEXA) 40 MG tablet Take 40 mg by mouth. 1/2 daily = 20  mg      . Cyanocobalamin (VITAMIN B-12 PO) Take 1 tablet by mouth daily.      Marland Kitchen diltiazem (CARDIZEM) 120 MG tablet Take 1 tablet (120 mg total) by mouth 3 (three) times daily.  270 tablet  3  . diphenoxylate-atropine (LOMOTIL) 2.5-0.025 MG per tablet TAKE 1 TABLET BY MOUTH 4 TIMES A DAY AS NEEDED FOR DIARRHEA  100 tablet  1  . donepezil (ARICEPT) 10 MG tablet Take 1 tablet (10 mg total) by mouth daily.  30 tablet  6  . dorzolamide-timolol (COSOPT) 22.3-6.8 MG/ML ophthalmic solution       . ferrous fumarate (HEMOCYTE - 106 MG FE) 325 (106 FE) MG TABS Take 1 tablet by mouth 2 (two) times daily.      . furosemide (LASIX) 40 MG tablet Take 1 tablet (40 mg total) by mouth daily.  30 tablet  11  . insulin glargine (LANTUS) 100 UNIT/ML injection Inject 30 Units into the skin at bedtime.      Marland Kitchen  isosorbide mononitrate (IMDUR) 60 MG 24 hr tablet TAKE 1 AND 1/2 TABLETS DAILY.  135 tablet  1  . KLOR-CON 10 10 MEQ tablet TAKE 1 TABLET TWICE DAILY  60 tablet  0  . labetalol (NORMODYNE) 200 MG tablet Take 200 mg by mouth 2 (two) times daily.      Marland Kitchen levothyroxine (SYNTHROID, LEVOTHROID) 75 MCG tablet TAKE 1 TABLET EVERY DAY  90 tablet  3  . losartan (COZAAR) 100 MG tablet Take 100 mg by mouth daily.       . Multiple Vitamins-Minerals (ICAPS) CAPS Take 1 capsule by mouth daily.      . pravastatin (PRAVACHOL) 40 MG tablet Take 40 mg by mouth daily.      . meloxicam (MOBIC) 15 MG tablet Take 15 mg  by mouth as needed.       No current facility-administered medications for this visit.    Past Medical History  Diagnosis Date  . Hypertension   . Ischemic heart disease   . Hyperlipemia   . Depression   . Testicular cancer   . Polio     child polio  . Rheumatic fever   . Mild dementia   . Mild dementia   . Systolic heart failure     acute on chronic  . CAD (coronary artery disease)   . Chronic atrial fibrillation   . Vitamin D deficiency   . Type II or unspecified type diabetes mellitus without mention of complication, not stated as uncontrolled   . Other testicular hypofunction     Past Surgical History  Procedure Laterality Date  . Coronary artery bypass graft  06/26/2002    LIMA to diagonal artery & SVG to the first obtuse margin  . Nm myocar perf wall motion  09/15/2006    Dilated LV w/small area of possible nontransmural inferior scar w/mild perinfarct ischemia.  No significant change from previous study.  . Cardiac defibrillator placement  05/01/2008    Medtronic    No family history on file.  History   Social History  . Marital Status: Married    Spouse Name: N/A    Number of Children: 0  . Years of Education: N/A   Occupational History  .     Social History Main Topics  . Smoking status: Former Smoker    Quit date: 12/24/1968  . Smokeless tobacco: Not on file  . Alcohol Use: No  . Drug Use: No  . Sexual Activity: Not on file   Other Topics Concern  . Not on file   Social History Narrative  . No narrative on file    Review of systems: Occasional shortness of breath on exertion, no particular pattern The patient specifically denies any chest pain at rest or with exertion, dyspnea at rest, orthopnea, paroxysmal nocturnal dyspnea, syncope, palpitations, focal neurological deficits, intermittent claudication, lower extremity edema, unexplained weight gain, cough, hemoptysis or wheezing.  The patient also denies abdominal pain, nausea, vomiting,  dysphagia, diarrhea, constipation, polyuria, polydipsia, dysuria, hematuria, frequency, urgency, abnormal bleeding or bruising, fever, chills, unexpected weight changes, mood swings, change in skin or hair texture, change in voice quality, auditory or visual problems, allergic reactions or rashes, new musculoskeletal complaints other than usual "aches and pains".   PHYSICAL EXAM BP 158/62  Pulse 77  Resp 20  Ht 5' 6.5" (1.689 m)  Wt 81.194 kg (179 lb)  BMI 28.46 kg/m2  General: Alert, oriented x3, no distress Head: no evidence of trauma, PERRL, EOMI, no exophtalmos or  lid lag, no myxedema, no xanthelasma; normal ears, nose and oropharynx Neck: normal jugular venous pulsations and no hepatojugular reflux; brisk carotid pulses without delay and no carotid bruits Chest: clear to auscultation, no signs of consolidation by percussion or palpation, normal fremitus, symmetrical and full respiratory excursions Cardiovascular: normal position and quality of the apical impulse, regular rhythm, normal first and paradoxically split second heart sounds, no murmurs, rubs or gallops Abdomen: no tenderness or distention, no masses by palpation, no abnormal pulsatility or arterial bruits, normal bowel sounds, no hepatosplenomegaly Extremities: no clubbing, cyanosis or edema; 2+ radial, ulnar and brachial pulses bilaterally; 2+ right femoral, posterior tibial and dorsalis pedis pulses; 2+ left femoral, posterior tibial and dorsalis pedis pulses; no subclavian or femoral bruits Neurological: grossly nonfocal   EKG: Atrial fibrillation, biventricular pacing  Lipid Panel     Component Value Date/Time   CHOL 149 12/24/2013 1511   TRIG 128 12/24/2013 1511   HDL 57 12/24/2013 1511   CHOLHDL 2.6 12/24/2013 1511   VLDL 26 12/24/2013 1511   LDLCALC 66 12/24/2013 1511    BMET    Component Value Date/Time   NA 139 12/24/2013 1511   K 4.2 12/24/2013 1511   CL 101 12/24/2013 1511   CO2 28 12/24/2013 1511   GLUCOSE  230* 12/24/2013 1511   BUN 19 12/24/2013 1511   CREATININE 1.32 12/24/2013 1511   CREATININE 1.09 08/04/2011 0545   CALCIUM 9.4 12/24/2013 1511   GFRNONAA >60 08/04/2011 0545   GFRAA >60 08/04/2011 0545     ASSESSMENT AND PLAN CAD (coronary artery disease) Asymptomatic. Most recent angiogram in 2007 showed both the mammary artery bypass and the SVG to the ramus intermedius were patent (50% stenosis in the SVG) and he had a widely patent right coronary artery  Permanent atrial fibrillation Likewise asymptomatic but imperfect ventricular rate control leads to the relatively low efficiency of biventricular pacing. Since his LVEF is now normal, recommend increasing the dose of diltiazem to 120 mg 3 times a day (he was only taking this short acting medication twice daily). On aspirin for stroke prophylaxis due to history of intracranial hemorrhage and high risk of bleeding complications. If necessary we will switch his labetalol to carvedilol for improved rate control.  Hypertension Imperfect control. Increase diltiazem, consider switching labetalol to carvedilol. Advised that he use the meloxicam sparingly and avoid other nonsteroidal anti-inflammatory drugs as well since these can worsen hypertension and volume retention.  Hyperlipemia Excellent lipid profile no changes  ICD, biventricular, in situ - Medtronic Protecta 2013, leads 2009 Normal device function, no changes made to device settings, only 88% ventricular resynchronization pacing because of atrial fibrillation   Orders Placed This Encounter  Procedures  . Implantable device check  . EKG 12-Lead   Meds ordered this encounter  Medications  . meloxicam (MOBIC) 15 MG tablet    Sig: Take 15 mg by mouth as needed.  . diltiazem (CARDIZEM) 120 MG tablet    Sig: Take 1 tablet (120 mg total) by mouth 3 (three) times daily.    Dispense:  270 tablet    Refill:  Sawmill Milas Schappell, MD, [facility] [phone] office [phone] pager

## 2014-01-21 NOTE — Assessment & Plan Note (Signed)
Asymptomatic. Most recent angiogram in 2007 showed both the mammary artery bypass and the SVG to the ramus intermedius were patent (50% stenosis in the SVG) and he had a widely patent right coronary artery

## 2014-01-21 NOTE — Patient Instructions (Addendum)
Remote monitoring is used to monitor your ICD from home. This monitoring reduces the number of office visits required to check your device to one time per year. It allows [place] to keep an eye on the functioning of your device to ensure it is working properly. You are scheduled for a device check from home on 04-24-2014. You may send your transmission at any time that day. If you have a wireless device, the transmission will be sent automatically. After your physician reviews your transmission, you will receive a postcard with your next transmission date.  Your physician has recommended you make the following change in your medication: Increase Diltiazem to 120 mg [place] physician recommends that you schedule a follow-up appointment in: 12 months with Dr.Croitoru

## 2014-01-21 NOTE — Assessment & Plan Note (Signed)
Normal device function, no changes made to device settings, only 88% ventricular resynchronization pacing because of atrial fibrillation

## 2014-01-21 NOTE — Assessment & Plan Note (Signed)
Excellent lipid profile no changes

## 2014-01-21 NOTE — Assessment & Plan Note (Addendum)
Imperfect control. Increase diltiazem, consider switching labetalol to carvedilol. Advised that he use the meloxicam sparingly and avoid other nonsteroidal anti-inflammatory drugs as well since these can worsen hypertension and volume retention.

## 2014-01-23 ENCOUNTER — Other Ambulatory Visit: Payer: Self-pay | Admitting: Emergency Medicine

## 2014-01-23 MED ORDER — POTASSIUM CHLORIDE ER 10 MEQ PO TBCR: EXTENDED_RELEASE_TABLET | ORAL | Status: DC

## 2014-02-04 ENCOUNTER — Encounter: Payer: Self-pay | Admitting: Physician Assistant

## 2014-02-04 ENCOUNTER — Ambulatory Visit (INDEPENDENT_AMBULATORY_CARE_PROVIDER_SITE_OTHER): Payer: Medicare Other | Admitting: Physician Assistant

## 2014-02-04 VITALS — BP 138/68 | HR 80 | Temp 97.9°F | Resp 16 | Ht 66.5 in | Wt 176.0 lb

## 2014-02-04 DIAGNOSIS — F329 Major depressive disorder, single episode, unspecified: Secondary | ICD-10-CM

## 2014-02-04 DIAGNOSIS — I10 Essential (primary) hypertension: Secondary | ICD-10-CM

## 2014-02-04 DIAGNOSIS — F3289 Other specified depressive episodes: Secondary | ICD-10-CM

## 2014-02-04 DIAGNOSIS — F32A Depression, unspecified: Secondary | ICD-10-CM

## 2014-02-04 DIAGNOSIS — I2510 Atherosclerotic heart disease of native coronary artery without angina pectoris: Secondary | ICD-10-CM

## 2014-02-04 DIAGNOSIS — E559 Vitamin D deficiency, unspecified: Secondary | ICD-10-CM

## 2014-02-04 DIAGNOSIS — I4821 Permanent atrial fibrillation: Secondary | ICD-10-CM

## 2014-02-04 DIAGNOSIS — Z23 Encounter for immunization: Secondary | ICD-10-CM

## 2014-02-04 DIAGNOSIS — I4891 Unspecified atrial fibrillation: Secondary | ICD-10-CM

## 2014-02-04 DIAGNOSIS — N3000 Acute cystitis without hematuria: Secondary | ICD-10-CM

## 2014-02-04 DIAGNOSIS — E119 Type 2 diabetes mellitus without complications: Secondary | ICD-10-CM

## 2014-02-04 NOTE — Progress Notes (Signed)
Subjective:  Troy Warren is a 80 y.o. male who presents for Medicare Annual Wellness Visit and 3 month follow up for HTN, hyperlipidemia, diabetes, and vitamin D Def.  Date of last medicare wellness visit was is unknown.  His blood pressure has been controlled at home, today their BP is BP: 138/68 mmHg He denies chest pain, shortness of breath, dizziness.  His cholesterol is diet controlled. In addition they are on pravastatin and denies myalgias. His cholesterol is controlled. The cholesterol last visit was:   Lab Results  Component Value Date   CHOL 149 12/24/2013   HDL 57 12/24/2013   LDLCALC 66 12/24/2013   TRIG 128 12/24/2013   CHOLHDL 2.6 12/24/2013   He has been working on diet and exercise for diabetes, and last visit his wife was complaining of hypoglycemia, so we decreased his lantus by 5 units, currently doing 30 in morning. Sugars are still in the 60-80s fasting. Last A1C in the office was:  Lab Results  Component Value Date   HGBA1C 7.8* 12/24/2013   Patient is on Vitamin D supplement.  He has seen Dr. Mary Sella and the CT scan and EEG shows no seizure or stokes, just dementia.  His daughter and wife states he is doing better from last visit, was treated for UTI, will recheck.   Names of Other Physician/Practitioners you currently use: 1. [facility] here for primary care 2. Dr. At Edd Arbour, eye doctor, last visit 11/2013 3. unknown, dentist, last visit 10/2013 Patient Care Team: Unk Pinto, MD as PCP - General (Internal Medicine) Garvin Fila, MD as Consulting Physician (Neurology) Sanda Klein, MD as Consulting Physician (Cardiology)   Medical Services you may have received from other than [facility] providers in the past year (date may be approximate) N/A  Medication Review: Current Outpatient Prescriptions on File Prior to Visit  Medication Sig Dispense Refill  . ALPRAZolam (XANAX) 1 MG tablet Take 0.5 mg by mouth at  bedtime. Take every night per wife      . aspirin 81 MG tablet Take 81 mg by mouth at bedtime.      . Cholecalciferol (VITAMIN D PO) Take 4,000 Int'l Units by mouth daily.      . citalopram (CELEXA) 40 MG tablet Take 40 mg by mouth. 1/2 daily = 20  mg      . Cyanocobalamin (VITAMIN B-12 PO) Take 1 tablet by mouth daily.      Marland Kitchen diltiazem (CARDIZEM) 120 MG tablet Take 1 tablet (120 mg total) by mouth 3 (three) times daily.  270 tablet  3  . diphenoxylate-atropine (LOMOTIL) 2.5-0.025 MG per tablet TAKE 1 TABLET BY MOUTH 4 TIMES A DAY AS NEEDED FOR DIARRHEA  100 tablet  1  . donepezil (ARICEPT) 10 MG tablet Take 1 tablet (10 mg total) by mouth daily.  30 tablet  6  . dorzolamide-timolol (COSOPT) 22.3-6.8 MG/ML ophthalmic solution       . ferrous fumarate (HEMOCYTE - 106 MG FE) 325 (106 FE) MG TABS Take 1 tablet by mouth 2 (two) times daily.      . furosemide (LASIX) 40 MG tablet Take 1 tablet (40 mg total) by mouth daily.  30 tablet  11  . insulin glargine (LANTUS) 100 UNIT/ML injection Inject 30 Units into the skin at bedtime.      . isosorbide mononitrate (IMDUR) 60 MG 24 hr tablet TAKE 1 AND 1/2 TABLETS DAILY.  135 tablet  1  . labetalol (NORMODYNE)  200 MG tablet Take 200 mg by mouth 2 (two) times daily.      Marland Kitchen levothyroxine (SYNTHROID, LEVOTHROID) 75 MCG tablet TAKE 1 TABLET EVERY DAY  90 tablet  3  . losartan (COZAAR) 100 MG tablet Take 100 mg by mouth daily.       . meloxicam (MOBIC) 15 MG tablet Take 15 mg by mouth as needed.      . Multiple Vitamins-Minerals (ICAPS) CAPS Take 1 capsule by mouth daily.      . potassium chloride (KLOR-CON 10) 10 MEQ tablet TAKE 1 TABLET TWICE DAILY  180 tablet  1  . pravastatin (PRAVACHOL) 40 MG tablet Take 40 mg by mouth daily.       No current facility-administered medications on file prior to visit.    Current Problems (verified) Patient Active Problem List   Diagnosis Date Noted  . Permanent atrial fibrillation 01/21/2014  . ICD, biventricular, in  situ - Medtronic Protecta 2013, leads 2009 01/21/2014  . Vitamin D deficiency   . Type II or unspecified type diabetes mellitus without mention of complication, not stated as uncontrolled   . Other testicular hypofunction   . Hypertension   . Ischemic heart disease   . Hyperlipemia   . Depression   . Testicular cancer   . Mild dementia   . CAD (coronary artery disease)   . Dementia, vascular 07/07/2013  . DEPRESSIVE DISORDER NOT ELSEWHERE CLASSIFIED 06/11/2008  . BRONCHOPNEUMONIA ORGANISM UNSPECIFIED 06/11/2008  . HYPOXEMIA 05/22/2008    Immunization History  Administered Date(s) Administered  . DT 02/04/2014  . Influenza, High Dose Seasonal PF 10/25/2013  . Pneumococcal Polysaccharide-23 03/26/2009    Screening Tests Health Maintenance  Topic Date Due  . Tetanus/tdap  11/27/1952  . Colonoscopy  11/28/1983  . Zostavax  11/27/1993  . Influenza Vaccine  07/13/2014  . Pneumococcal Polysaccharide Vaccine Age 54 And Over  Completed    Preventative care: Last colonoscopy: 2004  Prior vaccinations: TD or Tdap: 2005 DUE  Influenza: 09/2013 Pneumococcal: 2010 Shingles/Zostavax: ? Check on price  History reviewed: allergies, current medications, past family history, past medical history, past social history, past surgical history and problem list   Risk Factors: Tobacco History  Smoking status  . Former Smoker  . Quit date: 12/24/1968  Smokeless tobacco  . Not on file   He does not smoke.  Patient is a former smoker. Are there smokers in your home (other than you)?  No  Alcohol Current alcohol use: none  Caffeine Current caffeine use: coffee 1 /day  Exercise Current exercise habits: The patient does not participate in regular exercise at present.  Current exercise: walking Walks to mail box dialy  Nutrition/Diet Current diet: in general, a "healthy" diet    Cardiac risk factors: advanced age (older than 69 for men, 27 for women), diabetes mellitus,  dyslipidemia, family history of premature cardiovascular disease, hypertension, male gender and sedentary lifestyle.  Depression Screen Nurse depression screen reviewed.  (Note: if answer to either of the following is "Yes", a more complete depression screening is indicated)   Q1: Over the past two weeks, have you felt down, depressed or hopeless? No  Q2: Over the past two weeks, have you felt little interest or pleasure in doing things? No  Have you lost interest or pleasure in daily life? No  Do you often feel hopeless? No  Do you cry easily over simple problems? No Celexa helps  Activities of Daily Living Nurse ADLs screen reviewed.  In your  present state of health, do you have any difficulty performing the following activities?:  Driving? Yes Managing money?  Yes Feeding yourself? No Getting from bed to chair? No Climbing a flight of stairs? No Preparing food and eating?: No Bathing or showering? No Getting dressed: No Getting to the toilet? No Using the toilet:No Moving around from place to place: No In the past year have you fallen or had a near fall?:No   Are you sexually active?  No  Do you have more than one partner?  No  Vision Difficulties: No  Hearing Difficulties: Yes Do you often ask people to speak up or repeat themselves? Yes Do you experience ringing or noises in your ears? Yes Do you have difficulty understanding soft or whispered voices? Yes  Cognition  Do you feel that you have a problem with memory? Yes  Do you often misplace items? Yes  Do you feel safe at home?  Yes  Advanced directives Does patient have a Health Care Power of Attorney? Yes Does patient have a Living Will? Yes   Objective:     Vision and hearing screens reviewed.   Blood pressure 138/68, pulse 80, temperature 97.9 F (36.6 C), resp. rate 16, height 5' 6.5" (1.689 m), weight 176 lb (79.833 kg). Body mass index is 27.98 kg/(m^2).  General appearance: alert, no distress,  WD/WN, male Cognitive Testing  Alert? Yes  Normal Appearance?Yes  Oriented to person? Yes  Place? No   Time? No  Recall of three objects?  No  Can perform simple calculations? No  Displays appropriate judgment?Yes  Can read the correct time from a watch face?Yes  HEENT: normocephalic, sclerae anicteric, TMs pearly, nares patent, no discharge or erythema, pharynx normal Oral cavity: MMM, no lesions Neck: supple, no lymphadenopathy, no thyromegaly, no masses Heart: RRR, normal S1, S2, no murmurs Lungs: CTA bilaterally, no wheezes, rhonchi, or rales Abdomen: +bs, soft, non tender, non distended, no masses, no hepatomegaly, no splenomegaly Musculoskeletal: nontender, no swelling, no obvious deformity Extremities: 1-2+ edema, no cyanosis, no clubbing Pulses: 2+ symmetric, upper and lower extremities, normal cap refill Neurological: alert, oriented x 1, CN2-12 intact, strength normal upper extremities and lower extremities, sensation normal throughout, DTRs 2+ throughout, no cerebellar signs, gait normal Psychiatric: normal affect, behavior normal, pleasant   Assessment:   1. Hypertension At goal.   2. CAD (coronary artery disease) No CP, SOB  3. Permanent atrial fibrillation Due to high fall risk the patient's cardiologist and family do not have him on an anticoagulant.   4. Type II or unspecified type diabetes mellitus without mention of complication, not stated as uncontrolled Discussed general issues about diabetes pathophysiology and management., Educational material distributed., Suggested low cholesterol diet., Encouraged aerobic exercise., Discussed foot care., Reminded to get yearly retinal exam.  5. Vitamin D deficiency  6. Depression- remission with Celexa  7. Acute cystitis - Urinalysis, Routine w reflex microscopic - Urine culture  8. Need for prophylactic vaccination with combined diphtheria-tetanus-pertussis (DTP) vaccine - Dt vaccine greater than 7yo  IM   Plan:   During the course of the visit the patient was educated and counseled about appropriate screening and preventive services including:    Td vaccine  Screening electrocardiogram  Diabetes screening  Glaucoma screening  Nutrition counseling   Advanced directives: has an advanced directive - a copy HAS NOT been provided.  Screening recommendations, referrals: Vaccinations: Tdap vaccine yes  Influenza vaccine yes will get yearly Pneumococcal vaccine no Shingles vaccine yes wants to  get but can not due to financial situation will check with [facility] Hep B vaccine no  Nutrition assessed and recommended  Colonoscopy no due to age Recommended yearly ophthalmology/optometry visit for glaucoma screening and checkup Recommended yearly dental visit for hygiene and checkup Advanced directives - yes. Done just not on file  Conditions/risks identified: BMI 27: Discussed weight loss, diet, and increase physical activity.  Increase physical activity: AHA recommends 150 minutes of physical activity a week.  Medications reviewed Diabetes is not at goal, ACE/ARB therapy: Yes. Urinary Incontinence is an issue: discussed non pharmacology and pharmacology options.  Fall risk: high- discussed PT, home fall assessment, medication- will decrease insulin levels, declines PT at this time Dementia- family understands the progressive nature of the disease.   Medicare Attestation I have personally reviewed: The patient's medical and social history Their use of alcohol, tobacco or illicit drugs Their current medications and supplements The patient's functional ability including ADLs,fall risks, home safety risks, cognitive, and hearing and visual impairment Diet and physical activities Evidence for depression or mood disorders  The patient's weight, height, BMI, and visual acuity have been recorded in the chart.  I have made referrals, counseling, and provided education to the patient  based on review of the above and I have provided the patient with a written personalized care plan for preventive services.     Vicie Mutters, PA-C   02/04/2014

## 2014-02-04 NOTE — Patient Instructions (Addendum)
Please decrease the insulin by another 5 units and give a protein snack night and monitor morning sugars. ALSO if you can, 1-2 times a week try to check his sugar 2 hours after eating.   Preventative Care for Adults - Male      MAINTAIN REGULAR HEALTH EXAMS:  A routine yearly physical is a good way to check in with your primary care provider about your health and preventive screening. It is also an opportunity to share updates about your health and any concerns you have, and receive a thorough all-over exam.   Most health insurance companies pay for at least some preventative services.  Check with your health plan for specific coverages.  WHAT PREVENTATIVE SERVICES DO WOMEN NEED?  Adult women should have their weight and blood pressure checked regularly.   Women age 73 and older should have their cholesterol levels checked regularly.  Women should be screened for cervical cancer with a Pap smear and pelvic exam beginning at either age 17, or 3 years after they become sexually activity.    Breast cancer screening generally begins at age 37 with a mammogram and breast exam by your primary care provider.    Beginning at age 40 and continuing to age 47, women should be screened for colorectal cancer.  Certain people may need continued testing until age 7.  Updating vaccinations is part of preventative care.  Vaccinations help protect against diseases such as the flu.  Osteoporosis is a disease in which the bones lose minerals and strength as we age. Women ages 37 and over should discuss this with their caregivers, as should women after menopause who have other risk factors.  Lab tests are generally done as part of preventative care to screen for anemia and blood disorders, to screen for problems with the kidneys and liver, to screen for bladder problems, to check blood sugar, and to check your cholesterol level.  Preventative services generally include counseling about diet, exercise,  avoiding tobacco, drugs, excessive alcohol consumption, and sexually transmitted infections.    GENERAL RECOMMENDATIONS FOR GOOD HEALTH:  Healthy diet:  Eat a variety of foods, including fruit, vegetables, animal or vegetable protein, such as meat, fish, chicken, and eggs, or beans, lentils, tofu, and grains, such as rice.  Drink plenty of water daily.  Decrease saturated fat in the diet, avoid lots of red meat, processed foods, sweets, fast foods, and fried foods.  Exercise:  Aerobic exercise helps maintain good heart health. At least 30-40 minutes of moderate-intensity exercise is recommended. For example, a brisk walk that increases your heart rate and breathing. This should be done on most days of the week.   Find a type of exercise or a variety of exercises that you enjoy so that it becomes a part of your daily life.  Examples are running, walking, swimming, water aerobics, and biking.  For motivation and support, explore group exercise such as aerobic class, spin class, Zumba, Yoga,or  martial arts, etc.    Set exercise goals for yourself, such as a certain weight goal, walk or run in a race such as a 5k walk/run.  Speak to your primary care provider about exercise goals.  Disease prevention:  If you smoke or chew tobacco, find out from your caregiver how to quit. It can literally save your life, no matter how long you have been a tobacco user. If you do not use tobacco, never begin.   Maintain a healthy diet and normal weight. Increased weight leads to  problems with blood pressure and diabetes.   The Body Mass Index or BMI is a way of measuring how much of your body is fat. Having a BMI above 27 increases the risk of heart disease, diabetes, hypertension, stroke and other problems related to obesity. Your caregiver can help determine your BMI and based on it develop an exercise and dietary program to help you achieve or maintain this important measurement at a healthful  level.  High blood pressure causes heart and blood vessel problems.  Persistent high blood pressure should be treated with medicine if weight loss and exercise do not work.   Fat and cholesterol leaves deposits in your arteries that can block them. This causes heart disease and vessel disease elsewhere in your body.  If your cholesterol is found to be high, or if you have heart disease or certain other medical conditions, then you may need to have your cholesterol monitored frequently and be treated with medication.   Ask if you should have a cardiac stress test if your history suggests this. A stress test is a test done on a treadmill that looks for heart disease. This test can find disease prior to there being a problem.  Menopause can be associated with physical symptoms and risks. Hormone replacement therapy is available to decrease these. You should talk to your caregiver about whether starting or continuing to take hormones is right for you.   Osteoporosis is a disease in which the bones lose minerals and strength as we age. This can result in serious bone fractures. Risk of osteoporosis can be identified using a bone density scan. Women ages 43 and over should discuss this with their caregivers, as should women after menopause who have other risk factors. Ask your caregiver whether you should be taking a calcium supplement and Vitamin D, to reduce the rate of osteoporosis.   Avoid drinking alcohol in excess (more than two drinks per day).  Avoid use of street drugs. Do not share needles with anyone. Ask for professional help if you need assistance or instructions on stopping the use of alcohol, cigarettes, and/or drugs.  Brush your teeth twice a day with fluoride toothpaste, and floss once a day. Good oral hygiene prevents tooth decay and gum disease. The problems can be painful, unattractive, and can cause other health problems. Visit your dentist for a routine oral and dental check up and  preventive care every 6-12 months.   Look at your skin regularly.  Use a mirror to look at your back. Notify your caregivers of changes in moles, especially if there are changes in shapes, colors, a size larger than a pencil eraser, an irregular border, or development of new moles.  Safety:  Use seatbelts 100% of the time, whether driving or as a passenger.  Use safety devices such as hearing protection if you work in environments with loud noise or significant background noise.  Use safety glasses when doing any work that could send debris in to the eyes.  Use a helmet if you ride a bike or motorcycle.  Use appropriate safety gear for contact sports.  Talk to your caregiver about gun safety.  Use sunscreen with a SPF (or skin protection factor) of 15 or greater.  Lighter skinned people are at a greater risk of skin cancer. Don't forget to also wear sunglasses in order to protect your eyes from too much damaging sunlight. Damaging sunlight can accelerate cataract formation.   Practice safe sex. Use condoms. Condoms are used  for birth control and to help reduce the spread of sexually transmitted infections (or STIs).  Some of the STIs are gonorrhea (the clap), chlamydia, syphilis, trichomonas, herpes, HPV (human papilloma virus) and HIV (human immunodeficiency virus) which causes AIDS. The herpes, HIV and HPV are viral illnesses that have no cure. These can result in disability, cancer and death.   Keep carbon monoxide and smoke detectors in your home functioning at all times. Change the batteries every 6 months or use a model that plugs into the wall.   Vaccinations:  Stay up to date with your tetanus shots and other required immunizations. You should have a booster for tetanus every 10 years. Be sure to get your flu shot every year, since 5%-20% of the [place] population comes down with the flu. The flu vaccine changes each year, so being vaccinated once is not enough. Get your shot in the fall, before  the flu season peaks.   Other vaccines to consider:  Human Papilloma Virus or HPV causes cancer of the cervix, and other infections that can be transmitted from person to person. There is a vaccine for HPV, and females should get immunized between the ages of 10 and 59. It requires a series of 3 shots.   Pneumococcal vaccine to protect against certain types of pneumonia.  This is normally recommended for adults age 15 or older.  However, adults younger than 78 years old with certain underlying conditions such as diabetes, heart or lung disease should also receive the vaccine.  Shingles vaccine to protect against Varicella Zoster if you are older than age 78, or younger than 78 years old with certain underlying illness.  Hepatitis A vaccine to protect against a form of infection of the liver by a virus acquired from food.  Hepatitis B vaccine to protect against a form of infection of the liver by a virus acquired from blood or body fluids, particularly if you work in health care.  If you plan to travel internationally, check with your local health department for specific vaccination recommendations.  Cancer Screening:  Breast cancer screening is essential to preventive care for women. All women age 23 and older should perform a breast self-exam every month. At age 100 and older, women should have their caregiver complete a breast exam each year. Women at ages 69 and older should have a mammogram (x-ray film) of the breasts. Your caregiver can discuss how often you need mammograms.    Cervical cancer screening includes taking a Pap smear (sample of cells examined under a microscope) from the cervix (end of the uterus). It also includes testing for HPV (Human Papilloma Virus, which can cause cervical cancer). Screening and a pelvic exam should begin at age 84, or 3 years after a woman becomes sexually active. Screening should occur every year, with a Pap smear but no HPV testing, up to age 47. After  age 74, you should have a Pap smear every 3 years with HPV testing, if no HPV was found previously.   Most routine colon cancer screening begins at the age of 20. On a yearly basis, doctors may provide special easy to use take-home tests to check for hidden blood in the stool. Sigmoidoscopy or colonoscopy can detect the earliest forms of colon cancer and is life saving. These tests use a small camera at the end of a tube to directly examine the colon. Speak to your caregiver about this at age 55, when routine screening begins (and is repeated every 5  years unless early forms of pre-cancerous polyps or small growths are found).

## 2014-02-05 LAB — URINE CULTURE
Colony Count: NO GROWTH
ORGANISM ID, BACTERIA: NO GROWTH

## 2014-02-05 LAB — URINALYSIS, ROUTINE W REFLEX MICROSCOPIC
Bilirubin Urine: NEGATIVE
Glucose, UA: NEGATIVE mg/dL
Hgb urine dipstick: NEGATIVE
KETONES UR: NEGATIVE mg/dL
Leukocytes, UA: NEGATIVE
NITRITE: NEGATIVE
PH: 5.5 (ref 5.0–8.0)
Protein, ur: NEGATIVE mg/dL
SPECIFIC GRAVITY, URINE: 1.013 (ref 1.005–1.030)
Urobilinogen, UA: 0.2 mg/dL (ref 0.0–1.0)

## 2014-02-28 ENCOUNTER — Encounter: Payer: Self-pay | Admitting: Cardiovascular Disease

## 2014-03-01 ENCOUNTER — Other Ambulatory Visit: Payer: Self-pay | Admitting: Emergency Medicine

## 2014-03-08 ENCOUNTER — Ambulatory Visit: Payer: Medicare Other | Admitting: Nurse Practitioner

## 2014-03-11 ENCOUNTER — Ambulatory Visit: Payer: Medicare Other | Admitting: Nurse Practitioner

## 2014-03-18 ENCOUNTER — Emergency Department (HOSPITAL_COMMUNITY): Payer: Medicare Other

## 2014-03-18 ENCOUNTER — Inpatient Hospital Stay (HOSPITAL_COMMUNITY)
Admission: EM | Admit: 2014-03-18 | Discharge: 2014-03-21 | DRG: 193 | Disposition: A | Discharge status: Home / Self Care | Payer: Medicare Other | Attending: Internal Medicine | Admitting: Internal Medicine

## 2014-03-18 ENCOUNTER — Encounter (HOSPITAL_COMMUNITY): Payer: Self-pay | Admitting: Emergency Medicine

## 2014-03-18 DIAGNOSIS — J984 Other disorders of lung: Secondary | ICD-10-CM

## 2014-03-18 DIAGNOSIS — I4891 Unspecified atrial fibrillation: Secondary | ICD-10-CM | POA: Diagnosis present

## 2014-03-18 DIAGNOSIS — E291 Testicular hypofunction: Secondary | ICD-10-CM

## 2014-03-18 DIAGNOSIS — R0902 Hypoxemia: Secondary | ICD-10-CM

## 2014-03-18 DIAGNOSIS — Z951 Presence of aortocoronary bypass graft: Secondary | ICD-10-CM

## 2014-03-18 DIAGNOSIS — I672 Cerebral atherosclerosis: Secondary | ICD-10-CM | POA: Diagnosis present

## 2014-03-18 DIAGNOSIS — F0150 Vascular dementia without behavioral disturbance: Secondary | ICD-10-CM | POA: Diagnosis present

## 2014-03-18 DIAGNOSIS — Z87891 Personal history of nicotine dependence: Secondary | ICD-10-CM

## 2014-03-18 DIAGNOSIS — R222 Localized swelling, mass and lump, trunk: Secondary | ICD-10-CM | POA: Diagnosis present

## 2014-03-18 DIAGNOSIS — I2510 Atherosclerotic heart disease of native coronary artery without angina pectoris: Secondary | ICD-10-CM | POA: Diagnosis present

## 2014-03-18 DIAGNOSIS — C6290 Malignant neoplasm of unspecified testis, unspecified whether descended or undescended: Secondary | ICD-10-CM

## 2014-03-18 DIAGNOSIS — E559 Vitamin D deficiency, unspecified: Secondary | ICD-10-CM | POA: Diagnosis present

## 2014-03-18 DIAGNOSIS — Z9079 Acquired absence of other genital organ(s): Secondary | ICD-10-CM

## 2014-03-18 DIAGNOSIS — R599 Enlarged lymph nodes, unspecified: Secondary | ICD-10-CM | POA: Diagnosis present

## 2014-03-18 DIAGNOSIS — I4821 Permanent atrial fibrillation: Secondary | ICD-10-CM

## 2014-03-18 DIAGNOSIS — E119 Type 2 diabetes mellitus without complications: Secondary | ICD-10-CM

## 2014-03-18 DIAGNOSIS — E785 Hyperlipidemia, unspecified: Secondary | ICD-10-CM | POA: Diagnosis present

## 2014-03-18 DIAGNOSIS — I259 Chronic ischemic heart disease, unspecified: Secondary | ICD-10-CM

## 2014-03-18 DIAGNOSIS — R042 Hemoptysis: Secondary | ICD-10-CM

## 2014-03-18 DIAGNOSIS — K7460 Unspecified cirrhosis of liver: Secondary | ICD-10-CM | POA: Diagnosis present

## 2014-03-18 DIAGNOSIS — E1029 Type 1 diabetes mellitus with other diabetic kidney complication: Secondary | ICD-10-CM | POA: Diagnosis present

## 2014-03-18 DIAGNOSIS — F32A Depression, unspecified: Secondary | ICD-10-CM

## 2014-03-18 DIAGNOSIS — J9620 Acute and chronic respiratory failure, unspecified whether with hypoxia or hypercapnia: Secondary | ICD-10-CM | POA: Diagnosis present

## 2014-03-18 DIAGNOSIS — Z8612 Personal history of poliomyelitis: Secondary | ICD-10-CM

## 2014-03-18 DIAGNOSIS — Z7982 Long term (current) use of aspirin: Secondary | ICD-10-CM

## 2014-03-18 DIAGNOSIS — J9621 Acute and chronic respiratory failure with hypoxia: Secondary | ICD-10-CM

## 2014-03-18 DIAGNOSIS — J189 Pneumonia, unspecified organism: Principal | ICD-10-CM | POA: Diagnosis present

## 2014-03-18 DIAGNOSIS — F03A0 Unspecified dementia, mild, without behavioral disturbance, psychotic disturbance, mood disturbance, and anxiety: Secondary | ICD-10-CM

## 2014-03-18 DIAGNOSIS — J439 Emphysema, unspecified: Secondary | ICD-10-CM | POA: Diagnosis present

## 2014-03-18 DIAGNOSIS — R590 Localized enlarged lymph nodes: Secondary | ICD-10-CM

## 2014-03-18 DIAGNOSIS — I2589 Other forms of chronic ischemic heart disease: Secondary | ICD-10-CM | POA: Diagnosis present

## 2014-03-18 DIAGNOSIS — Z8547 Personal history of malignant neoplasm of testis: Secondary | ICD-10-CM

## 2014-03-18 DIAGNOSIS — E039 Hypothyroidism, unspecified: Secondary | ICD-10-CM | POA: Diagnosis present

## 2014-03-18 DIAGNOSIS — J180 Bronchopneumonia, unspecified organism: Secondary | ICD-10-CM

## 2014-03-18 DIAGNOSIS — F329 Major depressive disorder, single episode, unspecified: Secondary | ICD-10-CM

## 2014-03-18 DIAGNOSIS — I10 Essential (primary) hypertension: Secondary | ICD-10-CM

## 2014-03-18 DIAGNOSIS — J90 Pleural effusion, not elsewhere classified: Secondary | ICD-10-CM

## 2014-03-18 DIAGNOSIS — F3289 Other specified depressive episodes: Secondary | ICD-10-CM

## 2014-03-18 DIAGNOSIS — Z95810 Presence of automatic (implantable) cardiac defibrillator: Secondary | ICD-10-CM

## 2014-03-18 DIAGNOSIS — J438 Other emphysema: Secondary | ICD-10-CM | POA: Diagnosis present

## 2014-03-18 DIAGNOSIS — D689 Coagulation defect, unspecified: Secondary | ICD-10-CM | POA: Diagnosis present

## 2014-03-18 DIAGNOSIS — I5022 Chronic systolic (congestive) heart failure: Secondary | ICD-10-CM | POA: Diagnosis present

## 2014-03-18 DIAGNOSIS — F0390 Unspecified dementia without behavioral disturbance: Secondary | ICD-10-CM

## 2014-03-18 HISTORY — DX: Pleural effusion, not elsewhere classified: J90

## 2014-03-18 HISTORY — DX: Emphysema, unspecified: J43.9

## 2014-03-18 LAB — BASIC METABOLIC PANEL
BUN: 16 mg/dL (ref 6–23)
CALCIUM: 8.9 mg/dL (ref 8.4–10.5)
CHLORIDE: 107 meq/L (ref 96–112)
CO2: 27 mEq/L (ref 19–32)
CREATININE: 1.15 mg/dL (ref 0.50–1.35)
GFR, EST AFRICAN AMERICAN: 67 mL/min — AB (ref >90)
GFR, EST NON AFRICAN AMERICAN: 58 mL/min — AB (ref >90)
Glucose, Bld: 99 mg/dL (ref 70–99)
Potassium: 3.8 mEq/L (ref 3.7–5.3)
Sodium: 145 mEq/L (ref 137–147)

## 2014-03-18 LAB — CBC
HCT: 39.7 % (ref 39.0–52.0)
Hemoglobin: 13.2 g/dL (ref 13.0–17.0)
MCH: 30.1 pg (ref 26.0–34.0)
MCHC: 33.2 g/dL (ref 30.0–36.0)
MCV: 90.6 fL (ref 78.0–100.0)
PLATELETS: 151 10*3/uL (ref 150–400)
RBC: 4.38 MIL/uL (ref 4.22–5.81)
RDW: 15 % (ref 11.5–15.5)
WBC: 7.9 10*3/uL (ref 4.0–10.5)

## 2014-03-18 LAB — GLUCOSE, CAPILLARY
Glucose-Capillary: 102 mg/dL — ABNORMAL HIGH (ref 70–99)
Glucose-Capillary: 43 mg/dL — CL (ref 70–99)

## 2014-03-18 LAB — PROTIME-INR
INR: 1.13 (ref 0.00–1.49)
PROTHROMBIN TIME: 14.3 s (ref 11.6–15.2)

## 2014-03-18 LAB — APTT: APTT: 32 s (ref 24–37)

## 2014-03-18 LAB — MRSA PCR SCREENING: MRSA by PCR: NEGATIVE

## 2014-03-18 LAB — PRO B NATRIURETIC PEPTIDE: Pro B Natriuretic peptide (BNP): 4169 pg/mL — ABNORMAL HIGH (ref 0–450)

## 2014-03-18 MED ORDER — DORZOLAMIDE HCL-TIMOLOL MAL 2-0.5 % OP SOLN
1.0000 [drp] | Freq: Two times a day (BID) | OPHTHALMIC | Status: DC
  Administered 2014-03-18 – 2014-03-21 (×6): 1 [drp] via OPHTHALMIC
  Filled 2014-03-18: qty 10

## 2014-03-18 MED ORDER — LEVOTHYROXINE SODIUM 75 MCG PO TABS
75.0000 ug | ORAL_TABLET | Freq: Every day | ORAL | Status: DC
  Administered 2014-03-19 – 2014-03-21 (×3): 75 ug via ORAL
  Filled 2014-03-18 (×4): qty 1

## 2014-03-18 MED ORDER — ISOSORBIDE MONONITRATE ER 60 MG PO TB24
90.0000 mg | ORAL_TABLET | Freq: Every day | ORAL | Status: DC
  Administered 2014-03-19 – 2014-03-21 (×3): 90 mg via ORAL
  Filled 2014-03-18 (×3): qty 1

## 2014-03-18 MED ORDER — PRAVASTATIN SODIUM 40 MG PO TABS
40.0000 mg | ORAL_TABLET | Freq: Every day | ORAL | Status: DC
  Administered 2014-03-19 – 2014-03-21 (×3): 40 mg via ORAL
  Filled 2014-03-18 (×3): qty 1

## 2014-03-18 MED ORDER — MELOXICAM 15 MG PO TABS
15.0000 mg | ORAL_TABLET | ORAL | Status: DC | PRN
  Filled 2014-03-18: qty 1

## 2014-03-18 MED ORDER — FUROSEMIDE 40 MG PO TABS
40.0000 mg | ORAL_TABLET | Freq: Every day | ORAL | Status: DC
  Administered 2014-03-18 – 2014-03-21 (×4): 40 mg via ORAL
  Filled 2014-03-18 (×4): qty 1

## 2014-03-18 MED ORDER — INSULIN ASPART 100 UNIT/ML ~~LOC~~ SOLN: 0.0000 [IU] | Freq: Every day | SUBCUTANEOUS | Status: DC

## 2014-03-18 MED ORDER — DONEPEZIL HCL 10 MG PO TABS
10.0000 mg | ORAL_TABLET | Freq: Every day | ORAL | Status: DC
  Administered 2014-03-19 – 2014-03-21 (×3): 10 mg via ORAL
  Filled 2014-03-18 (×3): qty 1

## 2014-03-18 MED ORDER — INSULIN GLARGINE 100 UNIT/ML ~~LOC~~ SOLN: 25.0000 [IU] | Freq: Every day | SUBCUTANEOUS | Status: DC

## 2014-03-18 MED ORDER — VITAMIN D3 25 MCG (1000 UNIT) PO TABS
1000.0000 [IU] | ORAL_TABLET | Freq: Every day | ORAL | Status: DC
  Administered 2014-03-19 – 2014-03-21 (×3): 1000 [IU] via ORAL
  Filled 2014-03-18 (×3): qty 1

## 2014-03-18 MED ORDER — DIPHENOXYLATE-ATROPINE 2.5-0.025 MG PO TABS: 1.0000 | ORAL_TABLET | Freq: Four times a day (QID) | ORAL | Status: DC | PRN

## 2014-03-18 MED ORDER — LOSARTAN POTASSIUM 50 MG PO TABS
100.0000 mg | ORAL_TABLET | Freq: Every day | ORAL | Status: DC
  Administered 2014-03-19 – 2014-03-21 (×3): 100 mg via ORAL
  Filled 2014-03-18 (×3): qty 2

## 2014-03-18 MED ORDER — OXYCODONE HCL 5 MG PO TABS: 5.0000 mg | ORAL_TABLET | ORAL | Status: DC | PRN

## 2014-03-18 MED ORDER — ACETAMINOPHEN 325 MG PO TABS
650.0000 mg | ORAL_TABLET | Freq: Four times a day (QID) | ORAL | Status: DC | PRN
  Administered 2014-03-21: 650 mg via ORAL
  Filled 2014-03-18: qty 2

## 2014-03-18 MED ORDER — SIMVASTATIN 20 MG PO TABS: 20.0000 mg | ORAL_TABLET | Freq: Every day | ORAL | Status: DC

## 2014-03-18 MED ORDER — VITAMIN B-12 1000 MCG PO TABS
1000.0000 ug | ORAL_TABLET | Freq: Every day | ORAL | Status: DC
  Administered 2014-03-19 – 2014-03-21 (×3): 1000 ug via ORAL
  Filled 2014-03-18 (×3): qty 1

## 2014-03-18 MED ORDER — INSULIN ASPART 100 UNIT/ML ~~LOC~~ SOLN: 0.0000 [IU] | Freq: Three times a day (TID) | SUBCUTANEOUS | Status: DC

## 2014-03-18 MED ORDER — ONDANSETRON HCL 4 MG PO TABS: 4.0000 mg | ORAL_TABLET | Freq: Four times a day (QID) | ORAL | Status: DC | PRN

## 2014-03-18 MED ORDER — IOHEXOL 350 MG/ML SOLN
100.0000 mL | Freq: Once | INTRAVENOUS | Status: AC | PRN
  Administered 2014-03-18: 100 mL via INTRAVENOUS

## 2014-03-18 MED ORDER — CITALOPRAM HYDROBROMIDE 20 MG PO TABS
20.0000 mg | ORAL_TABLET | Freq: Every day | ORAL | Status: DC
  Administered 2014-03-19 – 2014-03-21 (×3): 20 mg via ORAL
  Filled 2014-03-18 (×3): qty 1

## 2014-03-18 MED ORDER — POTASSIUM CHLORIDE ER 10 MEQ PO TBCR
10.0000 meq | EXTENDED_RELEASE_TABLET | Freq: Every day | ORAL | Status: DC
  Administered 2014-03-19 – 2014-03-20 (×2): 10 meq via ORAL
  Filled 2014-03-18 (×2): qty 1

## 2014-03-18 MED ORDER — ONDANSETRON HCL 4 MG/2ML IJ SOLN
4.0000 mg | Freq: Four times a day (QID) | INTRAMUSCULAR | Status: DC | PRN
  Filled 2014-03-18: qty 2

## 2014-03-18 MED ORDER — LABETALOL HCL 200 MG PO TABS
200.0000 mg | ORAL_TABLET | Freq: Two times a day (BID) | ORAL | Status: DC
  Administered 2014-03-18 – 2014-03-21 (×6): 200 mg via ORAL
  Filled 2014-03-18 (×7): qty 1

## 2014-03-18 MED ORDER — POTASSIUM CHLORIDE IN NACL 20-0.9 MEQ/L-% IV SOLN
INTRAVENOUS | Status: DC
  Administered 2014-03-18: 22:00:00 via INTRAVENOUS
  Filled 2014-03-18 (×3): qty 1000

## 2014-03-18 MED ORDER — ACETAMINOPHEN 650 MG RE SUPP: 650.0000 mg | Freq: Four times a day (QID) | RECTAL | Status: DC | PRN

## 2014-03-18 MED ORDER — ALBUTEROL SULFATE (2.5 MG/3ML) 0.083% IN NEBU: 2.5000 mg | INHALATION_SOLUTION | RESPIRATORY_TRACT | Status: DC | PRN

## 2014-03-18 MED ORDER — DILTIAZEM HCL 60 MG PO TABS
120.0000 mg | ORAL_TABLET | Freq: Three times a day (TID) | ORAL | Status: DC
Start: 2014-03-18 — End: 2014-03-18

## 2014-03-18 MED ORDER — FERROUS FUMARATE 325 (106 FE) MG PO TABS
1.0000 | ORAL_TABLET | Freq: Two times a day (BID) | ORAL | Status: DC
  Administered 2014-03-18 – 2014-03-21 (×6): 106 mg via ORAL
  Filled 2014-03-18 (×7): qty 1

## 2014-03-18 MED ORDER — DOCUSATE SODIUM 100 MG PO CAPS
100.0000 mg | ORAL_CAPSULE | Freq: Two times a day (BID) | ORAL | Status: DC
Start: 2014-03-18 — End: 2014-03-21
  Administered 2014-03-18 – 2014-03-21 (×6): 100 mg via ORAL
  Filled 2014-03-18 (×7): qty 1

## 2014-03-18 MED ORDER — GUAIFENESIN-DM 100-10 MG/5ML PO SYRP: 5.0000 mL | ORAL_SOLUTION | ORAL | Status: DC | PRN

## 2014-03-18 MED ORDER — ALPRAZOLAM 0.5 MG PO TABS
0.5000 mg | ORAL_TABLET | Freq: Every day | ORAL | Status: DC
  Administered 2014-03-18 – 2014-03-20 (×3): 0.5 mg via ORAL
  Filled 2014-03-18 (×3): qty 1

## 2014-03-18 MED ORDER — DILTIAZEM HCL 60 MG PO TABS
120.0000 mg | ORAL_TABLET | Freq: Three times a day (TID) | ORAL | Status: DC
  Administered 2014-03-18 – 2014-03-21 (×8): 120 mg via ORAL
  Filled 2014-03-18 (×11): qty 2

## 2014-03-18 NOTE — ED Notes (Signed)
Family states pt has had unproductive cough x 2 weeks. Pts wife states when she looked in a cup this morning that pt had spit in it, there was some bright red blood mixed in with sputum. Pt denies pain. Pt is normally on 2 liters of O2 at night and when needed.

## 2014-03-18 NOTE — ED Notes (Signed)
Patient denies cough, fevers, nightsweats.  He gets SOB with exertion according to wife.  Wife reports he has been more fatigued lately.  Denies nausea, vomiting, and any pain.

## 2014-03-18 NOTE — ED Provider Notes (Signed)
CSN: [id number]     Arrival date & time 03/18/14  1423 History   First MD Initiated Contact with Patient 03/18/14 1454     Chief Complaint  Patient presents with  . coughing up blood      (Consider location/radiation/quality/duration/timing/severity/associated sxs/prior Treatment) HPI Comments: Intermittent unproductive cough for 2 weeks, not having it daily. One episode of frank bloody hemoptysis with roughly 1 tsp to 1 tbsp of blood. Only one occurrence.  Patient is a 80 y.o. male presenting with cough. The history is provided by the patient.  Cough Cough characteristics:  Productive Sputum characteristics:  Bloody Severity:  Mild Onset quality:  Sudden Duration: 1 day. Timing:  Sporadic Progression:  Unchanged Chronicity:  New Smoker: no   Context: not sick contacts, not upper respiratory infection and not weather changes   Relieved by:  Nothing Worsened by:  Nothing tried Associated symptoms: shortness of breath (with exertion - chronic)   Associated symptoms: no chest pain, no chills and no fever     Past Medical History  Diagnosis Date  . Hypertension   . Ischemic heart disease   . Hyperlipemia   . Depression   . Testicular cancer   . Polio     child polio  . Rheumatic fever   . Mild dementia   . Mild dementia   . Systolic heart failure     acute on chronic  . CAD (coronary artery disease)   . Chronic atrial fibrillation   . Vitamin D deficiency   . Type II or unspecified type diabetes mellitus without mention of complication, not stated as uncontrolled   . Other testicular hypofunction   . Permanent atrial fibrillation 01/21/2014  . ICD, biventricular, in situ - Medtronic Protecta 2013, leads 2009 01/21/2014    New Generator Medtronic [place] model number [id number], serial number [id number]  Right atrial lead (chronic) Medtronic, model number [id number], serial [id number] (implanted 05/01/2008)  Right ventricular lead (chronic) Medtronic, model number [id number], serial  number [id number] (implanted 05/01/2008)  Left ventricular lead Medtronic, model number [id number], serial number [id number] (implanted 05/01/2008)  Exp   Past Surgical History  Procedure Laterality Date  . Coronary artery bypass graft  06/26/2002    LIMA to diagonal artery & SVG to the first obtuse margin  . Nm myocar perf wall motion  09/15/2006    Dilated LV w/small area of possible nontransmural inferior scar w/mild perinfarct ischemia.  No significant change from previous study.  . Cardiac defibrillator placement  05/01/2008    Medtronic   No family history on file. History  Substance Use Topics  . Smoking status: Former Smoker    Quit date: 12/24/1968  . Smokeless tobacco: Not on file  . Alcohol Use: No    Review of Systems  Constitutional: Negative for fever and chills.  Respiratory: Positive for cough (unproductive - 2 weeks) and shortness of breath (with exertion - chronic).   Cardiovascular: Negative for chest pain and leg swelling.  Gastrointestinal: Negative for vomiting and abdominal pain.  All other systems reviewed and are negative.      Allergies  Lipitor  Home Medications   Current Outpatient Rx  Name  Route  Sig  Dispense  Refill  . ALPRAZolam (XANAX) 1 MG tablet   Oral   Take 0.5 mg by mouth at bedtime. Take every night per wife         . aspirin 81 MG tablet   Oral   Take 81 mg by mouth at bedtime.         Marland Kitchen  Cholecalciferol (VITAMIN D PO)   Oral   Take 4,000 Int'l Units by mouth daily.         . citalopram (CELEXA) 40 MG tablet   Oral   Take 40 mg by mouth. 1/2 daily = 20  mg         . Cyanocobalamin (VITAMIN B-12 PO)   Oral   Take 1 tablet by mouth daily.         Marland Kitchen diltiazem (CARDIZEM) 120 MG tablet   Oral   Take 1 tablet (120 mg total) by mouth 3 (three) times daily.   270 tablet   3   . diphenoxylate-atropine (LOMOTIL) 2.5-0.025 MG per tablet      TAKE 1 TABLET BY MOUTH 4 TIMES A DAY AS NEEDED FOR DIARRHEA   100 tablet   1    . donepezil (ARICEPT) 10 MG tablet   Oral   Take 1 tablet (10 mg total) by mouth daily.   30 tablet   6   . dorzolamide-timolol (COSOPT) 22.3-6.8 MG/ML ophthalmic solution               . ferrous fumarate (HEMOCYTE - 106 MG FE) 325 (106 FE) MG TABS   Oral   Take 1 tablet by mouth 2 (two) times daily.         . furosemide (LASIX) 40 MG tablet   Oral   Take 1 tablet (40 mg total) by mouth daily.   30 tablet   11   . insulin glargine (LANTUS) 100 UNIT/ML injection   Subcutaneous   Inject 30 Units into the skin at bedtime.         . isosorbide mononitrate (IMDUR) 60 MG 24 hr tablet      TAKE 1 AND 1/2 TABLETS DAILY.   135 tablet   1   . labetalol (NORMODYNE) 200 MG tablet   Oral   Take 200 mg by mouth 2 (two) times daily.         Marland Kitchen levothyroxine (SYNTHROID, LEVOTHROID) 75 MCG tablet      TAKE 1 TABLET EVERY DAY   90 tablet   3   . losartan (COZAAR) 100 MG tablet   Oral   Take 100 mg by mouth daily.          . meloxicam (MOBIC) 15 MG tablet   Oral   Take 15 mg by mouth as needed.         . Multiple Vitamins-Minerals (ICAPS) CAPS   Oral   Take 1 capsule by mouth daily.         . potassium chloride (KLOR-CON 10) 10 MEQ tablet      TAKE 1 TABLET TWICE DAILY   180 tablet   1   . pravastatin (PRAVACHOL) 40 MG tablet   Oral   Take 40 mg by mouth daily.          BP 160/76  Temp(Src) 98.3 F (36.8 C) (Oral)  Resp 20  SpO2 91% Physical Exam  Nursing note and vitals reviewed. Constitutional: He is oriented to person, place, and time. He appears well-developed and well-nourished. No distress.  HENT:  Head: Normocephalic and atraumatic.  Mouth/Throat: Oropharynx is clear and moist. No oropharyngeal exudate.  Eyes: EOM are normal. Pupils are equal, round, and reactive to light.  Neck: Normal range of motion. Neck supple.  Cardiovascular: Normal rate and regular rhythm.  Exam reveals no friction rub.   No murmur heard. Pulmonary/Chest:  Effort normal and breath  sounds normal. No respiratory distress. He has no wheezes. He has no rales.  Abdominal: Soft. He exhibits no distension. There is no tenderness. There is no rebound.  Musculoskeletal: Normal range of motion. He exhibits no edema.  Neurological: He is alert and oriented to person, place, and time.  Skin: No rash noted. He is not diaphoretic.    ED Course  Procedures (including critical care time) Labs Review Labs Reviewed - No data to display Imaging Review Dg Chest 2 View  03/18/2014   CLINICAL DATA:  Hemoptysis  EXAM: CHEST  2 VIEW  COMPARISON:  10/23/2012  FINDINGS: Cardiomegaly is noted. Cardiac pacemaker and AICD leads are unchanged in position. There is right perihilar infiltrate or infiltrative process. Further evaluation with enhanced CT scan is recommended. Small left pleural effusion. Osteopenia and degenerative changes thoracic spine. No pulmonary edema. Status post median sternotomy.  IMPRESSION: Cardiomegaly. Status post median sternotomy. Right perihilar infiltrate or infiltrative process. Further evaluation with CT scan is recommended. Small left pleural effusion. No pulmonary edema.   Electronically Signed   By: Lahoma Crocker M.D.   On: 03/18/2014 16:23   Ct Angio Chest Pe W/cm &/or Wo Cm  03/18/2014   CLINICAL DATA:  Chest pain and shortness of breath.  EXAM: CT ANGIOGRAPHY CHEST WITH CONTRAST  TECHNIQUE: Multidetector CT imaging of the chest was performed using the standard protocol during bolus administration of intravenous contrast. Multiplanar CT image reconstructions and MIPs were obtained to evaluate the vascular anatomy. Right perihilar abnormality on today's chest x-ray  CONTRAST:  132mL OMNIPAQUE IOHEXOL 350 MG/ML SOLN  COMPARISON:  DG CHEST 2 VIEW dated 03/18/2014  FINDINGS: Contrast within the pulmonary arterial tree is normal in appearance. There are no filling defects to suggest an acute pulmonary embolism. The caliber of the thoracic aorta is normal.  There is limited contrast present in the thoracic aorta and thus it is difficult to totally exclude a false a lumen. The cardiac chambers are enlarged. There are coronary artery calcifications present. A permanent pacemaker/defibrillator is present.  There are enlarged right hilar as well as mediastinal lymph nodes. A right hilar lymph node on image 47 of series 4 measures 1.8 cm in short axis. A second node at the same level more posteriorly measures 1.6 cm. A precarinal lymph node on image 36 of series 4 measures 1.9 cmis. A node just lateral to the proximal left main pulmonary artery measures 1.4 cm. A lymph node posterior to the right main pulmonary artery measures 1.5 cm in short axis. The thoracic esophagus is not abnormally distended. The thyroid gland appears heterogeneous in density.  There is a moderate-sized right pleural effusion layering posteriorly. There is increased interstitial density in the right upper lobe centered in the perihilar region and to a lesser extent in the right lower lobe. There is confluent parenchymal density in the right lower lobe more inferiorly adjacent to the pleural effusion. On the left there is no significant pleural effusion, but there is minimal posterior basal atelectasis. Both lungs demonstrate emphysematous changes.  Within the upper abdomen the observed portions of the liver and spleen appear normal.  The thoracic vertebral bodies are preserved in height. There is degenerative disc changes in the mid and lower thoracic spine.  Review of the MIP images confirms the above findings.  IMPRESSION: 1. There is no evidence of an acute pulmonary embolism. 2. There are emphysematous changes in both lungs. However, abnormal interstitial density in the right perihilar region and interstitial and alveolar  density in the right lower lobe is present and worrisome for pneumonia or malignancy. 3. There are enlarged mediastinal and right hilar lymph nodes. There is a moderate-sized  right pleural effusion. 4. There are post CABG changes.  The cardiac chambers are enlarged. 5. These results were called by telephone at the time of interpretation on 03/18/2014 at 5:41 PM to Dr. Murlean Caller , who verbally acknowledged these results.   Electronically Signed   By: David  Martinique   On: 03/18/2014 17:41     EKG Interpretation None      MDM   Final diagnoses:  Mediastinal lymphadenopathy  Hypoxia  Hemoptysis    80 year old male presents with hemoptysis. Has had 2 weeks of intermittent, dry cough. One episode of frank bloody hemoptysis this morning. Roughly one teaspoon to 1 tablespoon of blood, and this only happened one time. He's had no other illnesses. He denies any chest pain. He has chronic shortness of breath on exertion. Here he is hypoxic on room air at 84%. He wears oxygen at night and as needed. On 2 L he is satting in the mid 1s. He's on aspirin, but no other anti-coagulants. Will start with labs and CXR, will plan on CT Angio of chest. CT shows mediastinal lymphadenopathy concerning for cancer and new R sided pleural effusion. Will admit.    Osvaldo Shipper, MD 03/19/14 [phone]

## 2014-03-18 NOTE — Progress Notes (Signed)
   CARE MANAGEMENT ED NOTE 03/18/2014  Patient:  BRINTON, BRANDEL   Account Number:  [id number]  Date Initiated:  03/18/2014  Documentation initiated by:  Livia Snellen  Subjective/Objective Assessment:   Patient presents to Ed with nonproductive cough for 2 weeks. Blood tinged sputum.     Subjective/Objective Assessment Detail:     Action/Plan:   Action/Plan Detail:   Anticipated DC Date:       Status Recommendation to Physician:   Result of Recommendation:    Other ED Services  Consult Working [place]  Other    Choice offered to / List presented to:            Status of service:  Completed, signed off  ED Comments:   ED Comments Detail:  EDCM spoke to patient and family at bedside.  Patient lives at home with his spouse.  Patient has a shower chair and guard rails on the bath tub for safety.  Patient does not have home health services at this time.  Patient has had home health services about five years ago when the patient had a stroke.  Patient's family reports the patient is independent in his ADL's.  Patient's spouse reports patient's oxygen is delivered from [place].Marland Kitchen  EDCM provided patient's spouse with a list of home health agencies in [facility] and a list of private duty nursing agencies.  EDCM explained to patient's family that with home health, the patient may receive a visiting RN, PT, OT, aide and social worker if needed.  Patient's family thankful for resources.  Patient's family feel patient does not need home health services at this time.  No further EDCM needs at this time.

## 2014-03-18 NOTE — Progress Notes (Signed)
Hypoglycemic Event  CBG: 43  Treatment: 15 GM carbohydrate snack  Symptoms: None  Follow-up CBG: Time:2215 CBG Result:102  Possible Reasons for Event: Unknown  Comments/MD notified:    Troy Warren M  Remember to initiate Hypoglycemia Order Set & complete

## 2014-03-18 NOTE — Progress Notes (Signed)
Utilization Review completed.  Kareli Hossain RN CM  

## 2014-03-18 NOTE — H&P (Signed)
[facility] History and Physical  MATEI COOMER [id number] DOB: 24-May-1931 DOA: 03/18/2014  Referring physician: ED physician, Dr. Mingo Amber PCP: Alesia Richards, MD   Chief Complaint: Shortness of breath and coughing up blood  HPI: LEYLAND NICKLIN is a 80 y.o. male with a history of ischemic heart disease-status post CABG, ischemic cardiomyopathy-but with improvement in his ejection fraction to 60-65% following CRT per his cardiologist, Dr. Sallyanne Kuster, status post biventricular ICD, atrial fibrillation (on aspirin), and vascular dementia, who presents with a complaint of shortness of breath and coughing up of blood. The history is actually being provided by the patient's wife. He has no complaints at all and is not sure why he is in the emergency department. According to Mrs. Sowa, the patient has had worsening shortness of breath with activity. He does not normally need oxygen during the day, but he wears oxygen at night, 2 L. There has been no orthopnea or PND symptoms. He has had a intermittent dry and productive cough with clear sputum. However, this morning, when he coughed there was a moderate amount of red blood in the sputum according to Mrs. Roupp. The patient was unaware of this. He denies fever, chills, chest pain, pleurisy, or night sweats. He denies swelling in his legs. He denies nausea, vomiting, or abdominal pain. He denies bloody stools or black tarry stools.  In the emergency department, he is afebrile and hemodynamically stable, but hypertensive. His oxygen saturation was 84% on room air. His lab data are virtually normal. CT angiogram of his chest revealed no evidence of acute pulmonary embolism; emphysema changes in both lungs; abnormal interstitial density in the right perihilar region and interstitial and alveolar density in the right lower lobe worrisome for pneumonia or malignancy"; enlarged mediastinal and right hilar lymph nodes; moderate size right  pleural effusion; status post CABG changes. He is being admitted for further evaluation and management.    Review of Systems:  Review of systems as above in history present illness. In addition, he has had progressive memory loss from vascular dementia. Otherwise review of systems is negative.  Past Medical History  Diagnosis Date  . Hypertension   . Ischemic heart disease   . Hyperlipemia   . Depression   . Testicular cancer   . Polio     child polio  . Rheumatic fever   . Mild dementia   . Mild dementia   . Systolic heart failure     acute on chronic  . CAD (coronary artery disease)   . Chronic atrial fibrillation   . Vitamin D deficiency   . Type II or unspecified type diabetes mellitus without mention of complication, not stated as uncontrolled   . Other testicular hypofunction   . Permanent atrial fibrillation 01/21/2014  . ICD, biventricular, in situ - Medtronic Protecta 2013, leads 2009 01/21/2014    New Generator Medtronic [place] model number [id number], serial number [id number]  Right atrial lead (chronic) Medtronic, model number [id number], serial [id number] (implanted 05/01/2008)  Right ventricular lead (chronic) Medtronic, model number [id number], serial number [id number] (implanted 05/01/2008)  Left ventricular lead Medtronic, model number [id number], serial number [id number] (implanted 05/01/2008)  Exp   Past Surgical History  Procedure Laterality Date  . Coronary artery bypass graft  06/26/2002    LIMA to diagonal artery & SVG to the first obtuse margin  . Nm myocar perf wall motion  09/15/2006    Dilated LV w/small area of possible nontransmural inferior scar w/mild perinfarct ischemia.  No significant  change from previous study.  . Cardiac defibrillator placement  05/01/2008    Medtronic   Social History: He is married. He has 2 children. He quit smoking about 45 years ago. He has never used smokeless tobacco. He reports that he does not drink alcohol or use illicit drugs. He  ambulates without assistance.  Allergies  Allergen Reactions  . Lipitor [Atorvastatin]     weakness    His family history is positive for heart disease.  Prior to Admission medications   Medication Sig Start Date End Date Taking? Authorizing Provider  ALPRAZolam Duanne Moron) 1 MG tablet Take 0.5 mg by mouth at bedtime. Take every night per wife   Yes Historical Provider, MD  aspirin 81 MG tablet Take 81 mg by mouth daily after breakfast.    Yes Historical Provider, MD  Cholecalciferol (VITAMIN D PO) Take 4,000 Int'l Units by mouth daily.   Yes Historical Provider, MD  citalopram (CELEXA) 40 MG tablet Take 20 mg by mouth. Takes  1/2 of a 40 mg pill to equal 20 mg   Yes Historical Provider, MD  Cyanocobalamin (VITAMIN B-12 PO) Take 1 tablet by mouth daily.   Yes Historical Provider, MD  diltiazem (CARDIZEM) 120 MG tablet Take 1 tablet (120 mg total) by mouth 3 (three) times daily. 01/21/14  Yes Mihai Croitoru, MD  diphenoxylate-atropine (LOMOTIL) 2.5-0.025 MG per tablet TAKE 1 TABLET BY MOUTH 4 TIMES A DAY AS NEEDED FOR DIARRHEA 01/15/14  Yes Melissa R Smith, PA-C  donepezil (ARICEPT) 10 MG tablet Take 1 tablet (10 mg total) by mouth daily. 09/14/13  Yes Philmore Pali, NP  dorzolamide-timolol (COSOPT) 22.3-6.8 MG/ML ophthalmic solution Place 1 drop into both eyes 2 (two) times daily.  02/15/13  Yes Historical Provider, MD  ferrous fumarate (HEMOCYTE - 106 MG FE) 325 (106 FE) MG TABS Take 1 tablet by mouth 2 (two) times daily.   Yes Historical Provider, MD  furosemide (LASIX) 40 MG tablet Take 1 tablet (40 mg total) by mouth daily. 05/01/13  Yes Mihai Croitoru, MD  insulin glargine (LANTUS) 100 UNIT/ML injection Inject 25 Units into the skin daily after breakfast.    Yes Historical Provider, MD  isosorbide mononitrate (IMDUR) 60 MG 24 hr tablet TAKE 1 AND 1/2 TABLETS DAILY. 01/03/14  Yes Mihai Croitoru, MD  isosorbide mononitrate (IMDUR) 60 MG 24 hr tablet Take 90 mg by mouth daily. Pt takes 1and 1/2 of the 60  mg pill to equal 90 mg.   Yes Historical Provider, MD  labetalol (NORMODYNE) 200 MG tablet Take 200 mg by mouth 2 (two) times daily.   Yes Historical Provider, MD  levothyroxine (SYNTHROID, LEVOTHROID) 75 MCG tablet TAKE 1 TABLET EVERY DAY 10/29/13  Yes Melissa R Smith, PA-C  losartan (COZAAR) 100 MG tablet Take 100 mg by mouth daily.  04/03/13  Yes Historical Provider, MD  meloxicam (MOBIC) 15 MG tablet Take 15 mg by mouth as needed for pain.  01/15/14  Yes Historical Provider, MD  Multiple Vitamins-Minerals (ICAPS) CAPS Take 1 capsule by mouth daily.   Yes Historical Provider, MD  potassium chloride (KLOR-CON 10) 10 MEQ tablet TAKE 1 TABLET TWICE DAILY 01/23/14  Yes Melissa R Smith, PA-C  pravastatin (PRAVACHOL) 40 MG tablet Take 40 mg by mouth daily.   Yes Historical Provider, MD   Physical Exam: Filed Vitals:   03/18/14 1800  BP: 191/74  Pulse: 65  Temp:   Resp:     BP 191/74  Pulse 65  Temp(Src) 98.3 F (  36.8 C) (Oral)  Resp 20  SpO2 95%  General:  Appears calm and comfortable; 80 year old Caucasian man in no acute distress. Eyes: PERRL, normal lids, irises & conjunctiva ENT: grossly normal hearing, lips & tongue Neck: no LAD, masses or thyromegaly Cardiovascular: Irregular, irregular No LE edema. Telemetry: Pending  Respiratory:  Breathing nonlabored at rest. globally decreased breath sounds on the right. Rare crackles in the bases.  Palpable ICD right chest wall without surrounding erythema or tenderness.  Abdomen:Positive bowel sounds,  soft, ntnd, no hepatosplenomegaly, no masses palpated.  Skin: no rash or induration seen on limited exam Musculoskeletal: grossly normal tone BUE/BLE; no acute hot red joints. Psychiatric: he is alert and oriented to himself and his wife. He is not oriented to the name of the hospital, year, or date.  grossly normal mood and affect, speech fluent and appropriate Neurologic: grossly non-focal. cranial nerves II through XII are intact. He  follows directions without difficulty.           Labs on Admission:  Basic Metabolic Panel:  Recent Labs Lab 03/18/14 1545  NA 145  K 3.8  CL 107  CO2 27  GLUCOSE 99  BUN 16  CREATININE 1.15  CALCIUM 8.9   Liver Function Tests: No results found for this basename: AST, ALT, ALKPHOS, BILITOT, PROT, ALBUMIN,  in the last 168 hours No results found for this basename: LIPASE, AMYLASE,  in the last 168 hours No results found for this basename: AMMONIA,  in the last 168 hours CBC:  Recent Labs Lab 03/18/14 1545  WBC 7.9  HGB 13.2  HCT 39.7  MCV 90.6  PLT 151   Cardiac Enzymes: No results found for this basename: CKTOTAL, CKMB, CKMBINDEX, TROPONINI,  in the last 168 hours  BNP (last 3 results) No results found for this basename: PROBNP,  in the last 8760 hours CBG: No results found for this basename: GLUCAP,  in the last 168 hours  Radiological Exams on Admission: Dg Chest 2 View  03/18/2014   CLINICAL DATA:  Hemoptysis  EXAM: CHEST  2 VIEW  COMPARISON:  10/23/2012  FINDINGS: Cardiomegaly is noted. Cardiac pacemaker and AICD leads are unchanged in position. There is right perihilar infiltrate or infiltrative process. Further evaluation with enhanced CT scan is recommended. Small left pleural effusion. Osteopenia and degenerative changes thoracic spine. No pulmonary edema. Status post median sternotomy.  IMPRESSION: Cardiomegaly. Status post median sternotomy. Right perihilar infiltrate or infiltrative process. Further evaluation with CT scan is recommended. Small left pleural effusion. No pulmonary edema.   Electronically Signed   By: Lahoma Crocker M.D.   On: 03/18/2014 16:23   Ct Angio Chest Pe W/cm &/or Wo Cm  03/18/2014   CLINICAL DATA:  Chest pain and shortness of breath.  EXAM: CT ANGIOGRAPHY CHEST WITH CONTRAST  TECHNIQUE: Multidetector CT imaging of the chest was performed using the standard protocol during bolus administration of intravenous contrast. Multiplanar CT image  reconstructions and MIPs were obtained to evaluate the vascular anatomy. Right perihilar abnormality on today's chest x-ray  CONTRAST:  166mL OMNIPAQUE IOHEXOL 350 MG/ML SOLN  COMPARISON:  DG CHEST 2 VIEW dated 03/18/2014  FINDINGS: Contrast within the pulmonary arterial tree is normal in appearance. There are no filling defects to suggest an acute pulmonary embolism. The caliber of the thoracic aorta is normal. There is limited contrast present in the thoracic aorta and thus it is difficult to totally exclude a false a lumen. The cardiac chambers are enlarged. There are coronary  artery calcifications present. A permanent pacemaker/defibrillator is present.  There are enlarged right hilar as well as mediastinal lymph nodes. A right hilar lymph node on image 47 of series 4 measures 1.8 cm in short axis. A second node at the same level more posteriorly measures 1.6 cm. A precarinal lymph node on image 36 of series 4 measures 1.9 cmis. A node just lateral to the proximal left main pulmonary artery measures 1.4 cm. A lymph node posterior to the right main pulmonary artery measures 1.5 cm in short axis. The thoracic esophagus is not abnormally distended. The thyroid gland appears heterogeneous in density.  There is a moderate-sized right pleural effusion layering posteriorly. There is increased interstitial density in the right upper lobe centered in the perihilar region and to a lesser extent in the right lower lobe. There is confluent parenchymal density in the right lower lobe more inferiorly adjacent to the pleural effusion. On the left there is no significant pleural effusion, but there is minimal posterior basal atelectasis. Both lungs demonstrate emphysematous changes.  Within the upper abdomen the observed portions of the liver and spleen appear normal.  The thoracic vertebral bodies are preserved in height. There is degenerative disc changes in the mid and lower thoracic spine.  Review of the MIP images confirms  the above findings.  IMPRESSION: 1. There is no evidence of an acute pulmonary embolism. 2. There are emphysematous changes in both lungs. However, abnormal interstitial density in the right perihilar region and interstitial and alveolar density in the right lower lobe is present and worrisome for pneumonia or malignancy. 3. There are enlarged mediastinal and right hilar lymph nodes. There is a moderate-sized right pleural effusion. 4. There are post CABG changes.  The cardiac chambers are enlarged. 5. These results were called by telephone at the time of interpretation on 03/18/2014 at 5:41 PM to Dr. Murlean Caller , who verbally acknowledged these results.   Electronically Signed   By: David  Martinique   On: 03/18/2014 17:41    EKG: pending  Assessment/Plan Principal Problem:   Hemoptysis Active Problems:   Acute on chronic respiratory failure with hypoxia   Pleural effusion, right   Lung density on x-ray   Ischemic heart disease   ICD, biventricular, in situ - Medtronic Protecta 2013, leads 2009   Emphysema of lung   Dementia, vascular   Hypertension   Type II or unspecified type diabetes mellitus without mention of complication, not stated as uncontrolled   Permanent atrial fibrillation   1. Hemoptysis, cough, and shortness of breath. CT angiogram negative for pulmonary embolus, but it does reveal a right sided lung density, moderate pleural effusion, and mediastinal adenopathy. His symptomatology may be secondary to these findings on CT. His hemoglobin is stable. He is hemodynamically stable. He is afebrile. His symptomatology is not consistent with pneumonia. We'll provide supportive treatment. We'll hold aspirin and monitor. We'll order PT and PTT  2.  Right lung density and right pleural effusion. Etiology is unclear, but it does not appear that he has pneumonia given no fever and no elevated white blood cell count. Nevertheless, will order a 2-D echocardiogram to assess his LV function and  we'll order an ultrasound guided thoracentesis for cytology, cell count, culture, etc. We'll order a pro BNP. We'll hold aspirin. Consider pulmonary consultation for definitive diagnostic evaluation of the right lung density. The patient may or may not need a bronchoscopy. 3. Acute on chronic respiratory failure with hypoxia and radiographic changes of emphysema.  Worsening hypoxia, likely secondary to right pleural effusion. Will continue oxygen started in the ED, titrated to keep his oxygen saturations greater than 92%. We'll add when necessary albuterol. 4. Ischemic heart disease/status post ICD. This appears to be stable. He has no complaints of chest pain. 5. Chronic atrial fibrillation. He is treated with aspirin and is not on anticoagulation. His rate is currently controlled with diltiazem and labetalol. 6. Hypertension. His blood pressure is moderately elevated. We will continue his home medications, diltiazem, labetalol, and Cozaar. 7. Type 2 diabetes mellitus. Currently stable. He is treated chronically with Lantus. We'll hold Lantus and treat with sliding scale NovoLog. 8. Hypothyroidism. Continue Synthroid. We'll order TSH.    Code Status: Full code  Family Communication: discussed with his wife and daughter. Disposition Plan: to be determined.  Time spent: 1 HOUR AND 15 MINUTES.   [facility] [phone]

## 2014-03-19 ENCOUNTER — Inpatient Hospital Stay (HOSPITAL_COMMUNITY): Payer: Medicare Other

## 2014-03-19 DIAGNOSIS — I359 Nonrheumatic aortic valve disorder, unspecified: Secondary | ICD-10-CM

## 2014-03-19 DIAGNOSIS — R599 Enlarged lymph nodes, unspecified: Secondary | ICD-10-CM

## 2014-03-19 LAB — COMPREHENSIVE METABOLIC PANEL
ALT: 85 U/L — AB (ref 0–53)
AST: 261 U/L — ABNORMAL HIGH (ref 0–37)
Albumin: 3.6 g/dL (ref 3.5–5.2)
Alkaline Phosphatase: 269 U/L — ABNORMAL HIGH (ref 39–117)
BILIRUBIN TOTAL: 3.4 mg/dL — AB (ref 0.3–1.2)
BUN: 14 mg/dL (ref 6–23)
CO2: 30 meq/L (ref 19–32)
Calcium: 9.3 mg/dL (ref 8.4–10.5)
Chloride: 102 mEq/L (ref 96–112)
Creatinine, Ser: 1.02 mg/dL (ref 0.50–1.35)
GFR calc Af Amer: 78 mL/min — ABNORMAL LOW (ref >90)
GFR calc non Af Amer: 67 mL/min — ABNORMAL LOW (ref >90)
Glucose, Bld: 65 mg/dL — ABNORMAL LOW (ref 70–99)
POTASSIUM: 3.4 meq/L — AB (ref 3.7–5.3)
SODIUM: 144 meq/L (ref 137–147)
TOTAL PROTEIN: 6.9 g/dL (ref 6.0–8.3)

## 2014-03-19 LAB — LACTATE DEHYDROGENASE: LDH: 334 U/L — ABNORMAL HIGH (ref 94–250)

## 2014-03-19 LAB — CBC
HEMATOCRIT: 42 % (ref 39.0–52.0)
Hemoglobin: 13.9 g/dL (ref 13.0–17.0)
MCH: 29.9 pg (ref 26.0–34.0)
MCHC: 33.1 g/dL (ref 30.0–36.0)
MCV: 90.3 fL (ref 78.0–100.0)
PLATELETS: 153 10*3/uL (ref 150–400)
RBC: 4.65 MIL/uL (ref 4.22–5.81)
RDW: 14.9 % (ref 11.5–15.5)
WBC: 8.1 10*3/uL (ref 4.0–10.5)

## 2014-03-19 LAB — GLUCOSE, CAPILLARY
GLUCOSE-CAPILLARY: 60 mg/dL — AB (ref 70–99)
Glucose-Capillary: 132 mg/dL — ABNORMAL HIGH (ref 70–99)
Glucose-Capillary: 67 mg/dL — ABNORMAL LOW (ref 70–99)
Glucose-Capillary: 134 mg/dL — ABNORMAL HIGH (ref 70–99)
Glucose-Capillary: 120 mg/dL — ABNORMAL HIGH (ref 70–99)
Glucose-Capillary: 141 mg/dL — ABNORMAL HIGH (ref 70–99)

## 2014-03-19 LAB — TSH: TSH: 1.11 u[IU]/mL (ref 0.350–4.500)

## 2014-03-19 LAB — STREP PNEUMONIAE URINARY ANTIGEN: STREP PNEUMO URINARY ANTIGEN: NEGATIVE

## 2014-03-19 LAB — HEMOGLOBIN A1C
Hgb A1c MFr Bld: 6.6 % — ABNORMAL HIGH (ref <5.7)
Mean Plasma Glucose: 143 mg/dL — ABNORMAL HIGH (ref <117)

## 2014-03-19 MED ORDER — DEXTROSE 50 % IV SOLN
25.0000 mL | Freq: Once | INTRAVENOUS | Status: AC | PRN
  Administered 2014-03-19 (×2): 25 mL via INTRAVENOUS

## 2014-03-19 MED ORDER — SODIUM CHLORIDE 0.9 % IV SOLN
INTRAVENOUS | Status: DC
  Administered 2014-03-19: 100 mL/h via INTRAVENOUS
  Administered 2014-03-19 – 2014-03-20 (×2): via INTRAVENOUS

## 2014-03-19 MED ORDER — DOXYCYCLINE HYCLATE 100 MG PO TABS
100.0000 mg | ORAL_TABLET | Freq: Once | ORAL | Status: AC
  Administered 2014-03-19: 100 mg via ORAL
  Filled 2014-03-19: qty 1

## 2014-03-19 MED ORDER — DOXYCYCLINE HYCLATE 100 MG PO TABS
100.0000 mg | ORAL_TABLET | Freq: Two times a day (BID) | ORAL | Status: DC
Start: 2014-03-19 — End: 2014-03-20
  Administered 2014-03-19 – 2014-03-20 (×2): 100 mg via ORAL
  Filled 2014-03-19 (×3): qty 1

## 2014-03-19 MED ORDER — FUROSEMIDE 10 MG/ML IJ SOLN
40.0000 mg | Freq: Once | INTRAMUSCULAR | Status: AC
  Administered 2014-03-19: 40 mg via INTRAVENOUS
  Filled 2014-03-19: qty 4

## 2014-03-19 MED ORDER — DEXTROSE 5 % IV SOLN
1.0000 g | INTRAVENOUS | Status: DC
  Administered 2014-03-19: 1 g via INTRAVENOUS
  Filled 2014-03-19 (×2): qty 10

## 2014-03-19 MED ORDER — DEXTROSE 50 % IV SOLN
INTRAVENOUS | Status: AC
  Administered 2014-03-19: 25 mL via INTRAVENOUS
  Filled 2014-03-19: qty 50

## 2014-03-19 NOTE — Progress Notes (Signed)
Hypoglycemic Event  CBG: 60  Treatment: 1./2 amp d50  Symptoms: none  Follow-up CBG: Time:0825 CBG Result:132  Possible Reasons for Event: pt was npo for test  Comments/MD notified:Dr.Samtani no further orders  At present will adjust coverage.    Troy Warren A  Remember to initiate Hypoglycemia Order Set & complete

## 2014-03-19 NOTE — Progress Notes (Signed)
Patient presented to ultrasound for diagnostic and therapeutic right thoracentesis. Upon reviewing images minimal amount of right pleural fluid seen, not approachable percutaneously, procedure was cancelled.   Tsosie Billing PA-C Interventional Radiology  03/19/14  9:33 AM

## 2014-03-19 NOTE — Progress Notes (Signed)
Echocardiogram 2D Echocardiogram has been performed.  Troy Warren 03/19/2014, 12:34 PM

## 2014-03-19 NOTE — Progress Notes (Signed)
Note: This document was prepared with digital dictation and possible smart phrase technology. Any transcriptional errors that result from this process are unintentional.   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 admitted/6/15 with aggressive shortness of breath, cough, hemoptysis and found on CT chest to have changes of moderate right-sided pleural effusion and abnormal interstitial density right lower lobe worrisome for pneumonia or malignancy  Past medical history-As per Problem list Chart reviewed as below- reviewed  Consultants:  None  Procedures:  Thoracocentesis [place] pending  Antibiotics:  none   Subjective  Alert, no distress some mild cough No fever no chills thinks he is in church Not completely oriented Low cbg's this am noted by RN  Npo currently for procedure   Objective    Interim History: None  Telemetry: Paced rhythm   Objective: Filed Vitals:   03/18/14 2006 03/18/14 2100 03/19/14 0524 03/19/14 0615  BP: 173/83 153/76 183/67 154/68  Pulse: 62  66   Temp: 98 F (36.7 C)  97.6 F (36.4 C)   TempSrc: Oral  Oral   Resp: 21  19   Height: 5\' 6"  (1.676 m)     Weight: 79.198 kg (174 lb 9.6 oz)   77.4 kg (170 lb 10.2 oz)  SpO2: 93%  93%     Intake/Output Summary (Last 24 hours) at 03/19/14 0839 Last data filed at 03/19/14 0539  Gross per 24 hour  Intake    326 ml  Output   1100 ml  Net   -774 ml    Exam:  General: Alert pleasantly confused, thinks he is in church. Hungry Cardiovascular: S1-S2 paced rhythm no murmur rub or  gallop Respiratory: Decreased air entry right lower posterior lung field, no fremitus Abdomen: Soft nontender nondistended Skin no lower extremity edema Neuro intact  Data Reviewed: Basic Metabolic Panel:  Recent Labs Lab 03/18/14 1545 03/19/14 0530  NA 145 144  K 3.8 3.4*  CL 107 102  CO2 27 30  GLUCOSE 99 65*  BUN 16 14  CREATININE 1.15 1.02  CALCIUM 8.9 9.3   Liver Function Tests:  Recent Labs Lab 03/19/14 0530  AST 261*  ALT 85*  ALKPHOS 269*  BILITOT 3.4*  PROT 6.9  ALBUMIN 3.6   No results found for this basename: LIPASE, AMYLASE,  in the last 168 hours No results found for this basename: AMMONIA,  in the last 168 hours CBC:  Recent Labs Lab 03/18/14 1545 03/19/14 0530  WBC 7.9 8.1  HGB 13.2 13.9  HCT 39.7 42.0  MCV 90.6 90.3  PLT 151 153   Cardiac Enzymes: No results found for this basename: CKTOTAL, CKMB, CKMBINDEX, TROPONINI,  in the last 168 hours BNP: No components found with this basename: POCBNP,  CBG:  Recent Labs Lab 03/18/14 2135 03/18/14 2214 03/19/14 0755  GLUCAP 43* 102* 60*    Recent Results (from the past 240 hour(s))  MRSA PCR SCREENING     Status: None   Collection Time    03/18/14  9:04 PM      Result Value Ref Range Status   MRSA by PCR NEGATIVE  NEGATIVE Final   Comment:  The GeneXpert MRSA Assay (FDA     approved for NASAL specimens     only), is one component of a     comprehensive MRSA colonization     surveillance program. It is not     intended to diagnose MRSA     infection nor to guide or     monitor treatment for     MRSA infections.     Studies:              All Imaging reviewed and is as per above notation   Scheduled Meds: . ALPRAZolam  0.5 mg Oral QHS  . cholecalciferol  1,000 Units Oral Daily  . citalopram  20 mg Oral Daily  . dextrose      . diltiazem  120 mg Oral 3 times per day  . docusate sodium  100 mg Oral BID  . donepezil  10 mg Oral Daily  . dorzolamide-timolol  1 drop  Both Eyes BID  . ferrous fumarate  1 tablet Oral BID  . furosemide  40 mg Oral Daily  . insulin aspart  0-9 Units Subcutaneous TID WC  . isosorbide mononitrate  90 mg Oral Daily  . labetalol  200 mg Oral BID  . levothyroxine  75 mcg Oral QAC breakfast  . losartan  100 mg Oral Daily  . potassium chloride  10 mEq Oral Daily  . pravastatin  40 mg Oral Daily  . vitamin B-12  1,000 mcg Oral Daily   Continuous Infusions: . 0.9 % NaCl with KCl 20 mEq / L 60 mL/hr at 03/18/14 2134     Assessment/Plan: Principal Problem:  Hemoptysis  Active Problems:  Acute on chronic respiratory failure with hypoxia  Pleural effusion, right  Lung density on x-ray  Ischemic heart disease  ICD, biventricular, in situ - Medtronic Protecta 2013, leads 2009  Emphysema of lung  Dementia, vascular  Hypertension  Type II or unspecified type diabetes mellitus without mention of complication, not stated as uncontrolled  Permanent atrial fibrillation   1. Hemoptysis, cough, and shortness of breath. CT angiogram negative for pulmonary embolus, but it does reveal a right sided lung density, moderate pleural effusion, and mediastinal adenopathy. His symptomatology may be secondary to these findings on CT.  His symptomatology is not consistent with pneumonia. We'll provide supportive treatment. We'll hold aspirin and monitor. He 2. Right lung density and right pleural effusion. Etiology is unclear, but it does not appear that he has pneumonia given no fever and no elevated white blood cell count. 2-D echocardiogram pending,  ultrasound guided thoracentesis for cytology, cell count, culture, pending. ProBNP 4160 name We'll hold aspirin. Will consider pulmonology consult once preliminary workup done as patient's family not sure how aggressive they would want to be if cytology indeed does show evidences of tension cancer 3. Coagulopathy-patient nontender on exam, alkaline phosphatase 269, AST 261, bilirubin 3.4-potential  metastases vs. cholelithiasis although unlikely bladder. Monitor. Obtain stat abdominal ultrasound rule out liver pathology 4. Acute on chronic respiratory failure with hypoxia and radiographic changes of emphysema. Worsening hypoxia, likely secondary to right pleural effusion. Will continue oxygen started in the ED, titrated to keep his oxygen saturations greater than 92%. We'll add when necessary albuterol. 5. Ischemic heart disease/status post ICD. This appears to be stable. He has no complaints of chest pain-continue Cardizem 120 3 times a day, Imdur 90 daily, labetalol 200 twice a day, losartan 100 daily 6. Chronic atrial fibrillation. He is treated with aspirin and is not  on anticoagulation. His rate is currently controlled off of medications 7. Hypertension. His blood pressure is moderately elevated. We will continue his home medications as above 8. Known chronic systolic heart failure-BNP elevated at 4000. Await echocardiogram. Continue Lasix. Would discontinue meloxicam 9. Type 2 diabetes mellitus-hyperglycemia noted this a.m. discontinue each bedtime coverage-allowed to eat once study is done 10. Hypothyroidism. Continue Synthroid. We'll order TSH. 11. Vascular dementia-continue Aricept 10 mg (no evidence for vascular dementia on Alzheimer's) continue citalopram 20 daily, Xanax 0.5 each bedtime   Code Status: Full Family Communication: Discussed at bedside with family including wife and daughter about aggressiveness of care especially in setting of potential TIA, multiple systemic illnesses and they want to think about further options after results of tests prior to making any decision Disposition Plan: Inpatient pending workup   Verneita Griffes, MD  [facility] Pager [phone] 03/19/2014, 8:39 AM    LOS: 1 day

## 2014-03-19 NOTE — Progress Notes (Signed)
Stress case with pulmonology Dr. as patient's not able to get therapeutic thoracocentesis- Review of CT scan shows pneumonia + fluid >mass Recommends treatment for pneumonia and CHF which we have started he Have discussed this with family.  I've also discussed with them that he might have some advanced liver disease but this will need to be monitored carefully   Verneita Griffes, MD [facility] [phone]

## 2014-03-20 DIAGNOSIS — J180 Bronchopneumonia, unspecified organism: Secondary | ICD-10-CM

## 2014-03-20 DIAGNOSIS — F0150 Vascular dementia without behavioral disturbance: Secondary | ICD-10-CM

## 2014-03-20 DIAGNOSIS — J984 Other disorders of lung: Secondary | ICD-10-CM

## 2014-03-20 LAB — LEGIONELLA ANTIGEN, URINE: Legionella Antigen, Urine: NEGATIVE

## 2014-03-20 LAB — COMPREHENSIVE METABOLIC PANEL
ALBUMIN: 2.9 g/dL — AB (ref 3.5–5.2)
ALK PHOS: 218 U/L — AB (ref 39–117)
ALT: 59 U/L — ABNORMAL HIGH (ref 0–53)
AST: 71 U/L — ABNORMAL HIGH (ref 0–37)
BILIRUBIN TOTAL: 2.1 mg/dL — AB (ref 0.3–1.2)
BUN: 13 mg/dL (ref 6–23)
CHLORIDE: 102 meq/L (ref 96–112)
CO2: 26 mEq/L (ref 19–32)
Calcium: 8.4 mg/dL (ref 8.4–10.5)
Creatinine, Ser: 1.13 mg/dL (ref 0.50–1.35)
GFR calc non Af Amer: 59 mL/min — ABNORMAL LOW (ref >90)
GFR, EST AFRICAN AMERICAN: 69 mL/min — AB (ref >90)
GLUCOSE: 115 mg/dL — AB (ref 70–99)
Potassium: 3.4 mEq/L — ABNORMAL LOW (ref 3.7–5.3)
Sodium: 139 mEq/L (ref 137–147)
TOTAL PROTEIN: 5.5 g/dL — AB (ref 6.0–8.3)

## 2014-03-20 LAB — GLUCOSE, CAPILLARY
Glucose-Capillary: 96 mg/dL (ref 70–99)
Glucose-Capillary: 149 mg/dL — ABNORMAL HIGH (ref 70–99)
Glucose-Capillary: 156 mg/dL — ABNORMAL HIGH (ref 70–99)

## 2014-03-20 LAB — CBC
HEMATOCRIT: 35.4 % — AB (ref 39.0–52.0)
Hemoglobin: 11.8 g/dL — ABNORMAL LOW (ref 13.0–17.0)
MCH: 29.6 pg (ref 26.0–34.0)
MCHC: 33.3 g/dL (ref 30.0–36.0)
MCV: 88.9 fL (ref 78.0–100.0)
PLATELETS: 146 10*3/uL — AB (ref 150–400)
RBC: 3.98 MIL/uL — ABNORMAL LOW (ref 4.22–5.81)
RDW: 14.7 % (ref 11.5–15.5)
WBC: 7.4 10*3/uL (ref 4.0–10.5)

## 2014-03-20 MED ORDER — LEVOFLOXACIN 500 MG PO TABS
500.0000 mg | ORAL_TABLET | Freq: Every day | ORAL | Status: DC
  Administered 2014-03-20 – 2014-03-21 (×2): 500 mg via ORAL
  Filled 2014-03-20 (×2): qty 1

## 2014-03-20 MED ORDER — POTASSIUM CHLORIDE ER 10 MEQ PO TBCR
40.0000 meq | EXTENDED_RELEASE_TABLET | Freq: Every day | ORAL | Status: DC
  Administered 2014-03-20 – 2014-03-21 (×2): 40 meq via ORAL
  Filled 2014-03-20 (×2): qty 4

## 2014-03-20 NOTE — Evaluation (Signed)
Physical Therapy Evaluation Patient Details Name: Troy Warren MRN: [id number] DOB: [birth year] Today's Date: 03/20/2014   History of Present Illness  80 yo male admitted with hemoptysis. Hx of HTN, mild dementia, polio  Clinical Impression  On eval, pt required Min assist for mobility-able to ambulate ~160 feet with 2 hand held assist. Pt is unsteady. No family present during session. Recommend HHPt vs no follow up, depending on progress.     Follow Up Recommendations Supervision/Assistance - 24 hour;Home health PT;No PT follow up (depending on progress)    Equipment Recommendations  None recommended by PT    Recommendations for Other Services       Precautions / Restrictions Precautions Precautions: Fall Restrictions Weight Bearing Restrictions: No      Mobility  Bed Mobility Overal bed mobility: Modified Independent                Transfers Overall transfer level: Needs assistance   Transfers: Sit to/from Stand Sit to Stand: Min guard         General transfer comment: close guard for safety  Ambulation/Gait Ambulation/Gait assistance: Min assist Ambulation Distance (Feet): 160 Feet Assistive device: 2 person hand held assist Gait Pattern/deviations: Step-through pattern;Decreased stride length     General Gait Details: Unsteady. fatigued fairly easily. O2 sats 86% on RA during ambulation.   Stairs            Wheelchair Mobility    Modified Rankin (Stroke Patients Only)       Balance Overall balance assessment: Needs assistance         Standing balance support: Bilateral upper extremity supported;During functional activity Standing balance-Leahy Scale: Poor                               Pertinent Vitals/Pain No c/o pain    Home Living Family/patient expects to be discharged to:: Private residence Living Arrangements: Spouse/significant other   Type of Home: Mobile home Home Access: Stairs to enter     Home  Layout: One level        Prior Function Level of Independence: Independent               Hand Dominance        Extremity/Trunk Assessment   Upper Extremity Assessment: Overall WFL for tasks assessed           Lower Extremity Assessment: Generalized weakness      Cervical / Trunk Assessment: Normal  Communication   Communication: No difficulties  Cognition Arousal/Alertness: Awake/alert Behavior During Therapy: WFL for tasks assessed/performed Overall Cognitive Status: History of cognitive impairments - at baseline                      General Comments      Exercises        Assessment/Plan    PT Assessment Patient needs continued PT services  PT Diagnosis Difficulty walking;Generalized weakness   PT Problem List Decreased balance;Decreased mobility;Decreased cognition;Decreased activity tolerance  PT Treatment Interventions DME instruction;Gait training;Functional mobility training;Therapeutic activities;Therapeutic exercise;Patient/family education;Balance training   PT Goals (Current goals can be found in the Care Plan section) Acute Rehab PT Goals Patient Stated Goal: home soon PT Goal Formulation: With patient Time For Goal Achievement: 04/03/14 Potential to Achieve Goals: Good    Frequency Min 3X/week   Barriers to discharge        Co-evaluation  End of Session Equipment Utilized During Treatment: Gait belt Activity Tolerance: Patient tolerated treatment well Patient left: in bed;with call bell/phone within reach;with bed alarm set           Time: 1443-1457 PT Time Calculation (min): 14 min   Charges:   PT Evaluation $Initial PT Evaluation Tier I: 1 Procedure PT Treatments $Gait Training: 8-22 mins   PT G Codes:          Weston Anna, MPT Pager: [phone]

## 2014-03-20 NOTE — Progress Notes (Signed)
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 admitted/6/15 with aggressive shortness of breath, cough, hemoptysis and found on CT chest to have changes of moderate right-sided pleural effusion and abnormal interstitial density right lower lobe worrisome for pneumonia or malignancy  Past medical history-As per Problem list Chart reviewed as below- reviewed  Consultants:  None  Procedures:  Thoracocentesis [place] pending  Antibiotics:  ceftraixone    Subjective  No complaints, wants to go home   Objective    Interim History: None  Telemetry: Paced rhythm   Objective: Filed Vitals:   03/19/14 1427 03/19/14 2100 03/20/14 0520 03/20/14 1420  BP: 129/66 152/67 153/82 151/65  Pulse: 60 76 60 58  Temp: 98.4 F (36.9 C) 98.5 F (36.9 C) 97.3 F (36.3 C) 98.2 F (36.8 C)  TempSrc: Oral Oral Oral Oral  Resp: 18 20 20 19   Height:      Weight:   78.744 kg (173 lb 9.6 oz)   SpO2: 93% 97% 93% 97%    Intake/Output Summary (Last 24 hours) at 03/20/14 1726 Last data filed at 03/20/14 1529  Gross per 24 hour  Intake   1620 ml  Output   1100 ml  Net    520 ml    Exam:  General: Alert pleasantly confused, Cardiovascular: S1-S2 paced rhythm no murmur rub or gallop Respiratory: diminished at bases Abdomen: Soft nontender nondistended Skin no lower extremity edema Neuro intact  Data Reviewed: Basic Metabolic Panel:  Recent Labs Lab 03/18/14 1545 03/19/14 0530 03/20/14 0357  NA 145 144 139  K 3.8 3.4* 3.4*  CL 107 102 102  CO2 27 30 26   GLUCOSE 99  65* 115*  BUN 16 14 13   CREATININE 1.15 1.02 1.13  CALCIUM 8.9 9.3 8.4   Liver Function Tests:  Recent Labs Lab 03/19/14 0530 03/20/14 0357  AST 261* 71*  ALT 85* 59*  ALKPHOS 269* 218*  BILITOT 3.4* 2.1*  PROT 6.9 5.5*  ALBUMIN 3.6 2.9*   No results found for this basename: LIPASE, AMYLASE,  in the last 168 hours No results found for this basename: AMMONIA,  in the last 168 hours CBC:  Recent Labs Lab 03/18/14 1545 03/19/14 0530 03/20/14 0357  WBC 7.9 8.1 7.4  HGB 13.2 13.9 11.8*  HCT 39.7 42.0 35.4*  MCV 90.6 90.3 88.9  PLT 151 153 146*   Cardiac Enzymes: No results found for this basename: CKTOTAL, CKMB, CKMBINDEX, TROPONINI,  in the last 168 hours BNP: No components found with this basename: POCBNP,  CBG:  Recent Labs Lab 03/19/14 1648 03/19/14 2055 03/20/14 0812 03/20/14 1137 03/20/14 1605  GLUCAP 120* 141* 96 149* 156*    Recent Results (from the past 240 hour(s))  MRSA PCR SCREENING     Status: None   Collection Time    03/18/14  9:04 PM      Result Value Ref Range Status   MRSA by PCR NEGATIVE  NEGATIVE Final   Comment:            The GeneXpert MRSA Assay (FDA     approved for NASAL specimens     only), is one component of a  comprehensive MRSA colonization     surveillance program. It is not     intended to diagnose MRSA     infection nor to guide or     monitor treatment for     MRSA infections.  CULTURE, BLOOD (ROUTINE X 2)     Status: None   Collection Time    03/19/14  1:00 PM      Result Value Ref Range Status   Specimen Description BLOOD RIGHT ARM   Final   Special Requests BOTTLES DRAWN AEROBIC AND ANAEROBIC 5CC   Final   Culture  Setup Time     Final   Value: 03/19/2014 16:10     Performed at [facility]   Culture     Final   Value:        BLOOD CULTURE RECEIVED NO GROWTH TO DATE CULTURE WILL BE HELD FOR 5 DAYS BEFORE ISSUING A FINAL NEGATIVE REPORT     Performed at [facility]   Report Status PENDING    Incomplete  CULTURE, BLOOD (ROUTINE X 2)     Status: None   Collection Time    03/19/14  1:05 PM      Result Value Ref Range Status   Specimen Description BLOOD LEFT ARM   Final   Special Requests BOTTLES DRAWN AEROBIC AND ANAEROBIC [facility]   Final   Culture  Setup Time     Final   Value: 03/19/2014 16:10     Performed at [facility]   Culture     Final   Value:        BLOOD CULTURE RECEIVED NO GROWTH TO DATE CULTURE WILL BE HELD FOR 5 DAYS BEFORE ISSUING A FINAL NEGATIVE REPORT     Performed at [facility]   Report Status PENDING   Incomplete     Studies:              All Imaging reviewed and is as per above notation   Scheduled Meds: . ALPRAZolam  0.5 mg Oral QHS  . cholecalciferol  1,000 Units Oral Daily  . citalopram  20 mg Oral Daily  . diltiazem  120 mg Oral 3 times per day  . docusate sodium  100 mg Oral BID  . donepezil  10 mg Oral Daily  . dorzolamide-timolol  1 drop Both Eyes BID  . ferrous fumarate  1 tablet Oral BID  . furosemide  40 mg Oral Daily  . isosorbide mononitrate  90 mg Oral Daily  . labetalol  200 mg Oral BID  . levofloxacin  500 mg Oral Daily  . levothyroxine  75 mcg Oral QAC breakfast  . losartan  100 mg Oral Daily  . potassium chloride  40 mEq Oral Daily  . pravastatin  40 mg Oral Daily  . vitamin B-12  1,000 mcg Oral Daily   Continuous Infusions:     Assessment/Plan:   1. Pneumonia vs lung mass - Dr.Samtani d/w Dr.Alva PCCM who reviewed CT and favored pneumonia vs mass -plan to treat with course of Abx then repeat CT chest in 4weeks to determine need for biopsy if mass unchanged despite treatment -made FU with Dr.Clance for end of April -has risk factors for malignancy, former smoker  2. Hemoptysis -due to 1, resolved  3. Liver cirrhosis -noted on imaging, no h/o alcoholism -further workup as outpatient, per PCP/GI  4. CAD -stable, continue labetalol/imdur  5. Chronic atrial fibrillation.  -He is treated with aspirin  and  is not on anticoagulation. His rate is currently controlled on diltiazem  6. chronic systolic heart failure-BNP elevated at 4000. -echo pending -stable  7.  Type 2 diabetes mellitus-stable, SSI  8. Vascular dementia-continue Aricept 10 mg citalopram 20 daily, Xanax 0.5 each bedtime  Code Status: Full Family Communication: Discussed with wife and DIL at bedside Disposition Plan: home tomorrow if stable   Domenic Polite, MD  [facility] Pager [phone] 03/20/2014, 5:26 PM    LOS: 2 days

## 2014-03-21 DIAGNOSIS — I2510 Atherosclerotic heart disease of native coronary artery without angina pectoris: Secondary | ICD-10-CM

## 2014-03-21 LAB — GLUCOSE, CAPILLARY
GLUCOSE-CAPILLARY: 212 mg/dL — AB (ref 70–99)
Glucose-Capillary: 171 mg/dL — ABNORMAL HIGH (ref 70–99)

## 2014-03-21 MED ORDER — LEVOFLOXACIN 500 MG PO TABS: 500.0000 mg | ORAL_TABLET | Freq: Every day | ORAL | Status: DC

## 2014-03-21 MED ORDER — ASPIRIN 81 MG PO TABS: 81.0000 mg | ORAL_TABLET | Freq: Every day | ORAL | Status: DC

## 2014-03-21 MED ORDER — INSULIN GLARGINE 100 UNIT/ML ~~LOC~~ SOLN: 15.0000 [IU] | Freq: Every day | SUBCUTANEOUS | Status: DC

## 2014-03-21 NOTE — Clinical Documentation Improvement (Signed)
   Clinical Indicators:  Known Chronic Systolic Heart Failure  Treated with IV Lasix following discussion with CCM physician 03/19/2014 at 12:23pm  "CCM recommends treatment for pneumonia and CHF which we have started  Repeat CXR 03/19/2014 showing Interstitial Pulmonary Edema  Echo results 03/19/14  EF 67-54% Grade 3 Diastolic Dysfunction   Please document the ACUITY and TYPE of Heart Failure treated this admission:    - Acute on Chronic Systolic and Diastolic Heart Failure   - Other Acuity and Type of Heart Failure   - Unable to clinically Determine   Thank You,  Erling Conte ,RN BSN CCDS Certified Clinical Documentation Specialist:  [facility] [employer] [facility]

## 2014-03-21 NOTE — Care Management Note (Addendum)
    Page 1 of 1   03/21/2014     3:11:59 PM   CARE MANAGEMENT NOTE 03/21/2014  Patient:  Troy Warren, Troy Warren   Account Number:  [id number]  Date Initiated:  03/21/2014  Documentation initiated by:  Dessa Phi  Subjective/Objective Assessment:   80 Y/O M ADMITTED W/HEMOPTYSIS.     Action/Plan:   FROM HOME.HAS PCP,PHARMACY   Anticipated DC Date:  03/21/2014   Anticipated DC Plan:  HOME/SELF CARE      DC Planning Services  CM consult      Choice offered to / List presented to:             Status of service:  Completed, signed off Medicare Important Message given?   (If response is "NO", the following Medicare IM given date fields will be blank) Date Medicare IM given:   Date Additional Medicare IM given:    Discharge Disposition:  HOME/SELF CARE  Per UR Regulation:  Reviewed for med. necessity/level of care/duration of stay  If discussed at Long Length of Stay Meetings, dates discussed:    Comments:  03/21/14 Kathee Tumlin RN,BSN NCM [phone] [place] LIST AS RESOURCE FOR 24HR ASST(OUT OF POCKET COST,INDEPENDANT DECISION OF CHOICE.

## 2014-03-21 NOTE — Progress Notes (Signed)
Pt d/c home with daughter. D/c instruction reviewed with pt and family verbalized understanding.

## 2014-03-21 NOTE — Progress Notes (Signed)
Physical Therapy Treatment Patient Details Name: Troy Warren MRN: [id number] DOB: January 11, 1931 Today's Date: 03/21/2014    History of Present Illness 80 yo male admitted with hemoptysis. Hx of HTN, mild dementia, polio    PT Comments    Progressing with mobility. Pt continues to get SOB and O2 sats drop to 80s during ambulation. May benefit from RW use if pt remains unsteady. Will need 24 hour supervision.   Follow Up Recommendations  Supervision/Assistance - 24 hour;No PT follow up     Equipment Recommendations  None recommended by PT (may need to consider a walker, if pt does not have one)    Recommendations for Other Services       Precautions / Restrictions Precautions Precautions: Fall Restrictions Weight Bearing Restrictions: No    Mobility  Bed Mobility               General bed mobility comments: pt sitting in recliner  Transfers Overall transfer level: Needs assistance   Transfers: Sit to/from Stand Sit to Stand: Min guard         General transfer comment: close guard for safety  Ambulation/Gait Ambulation/Gait assistance: Min guard Ambulation Distance (Feet): 160 Feet Assistive device: Rolling walker (2 wheeled) Gait Pattern/deviations: Step-through pattern;Decreased stride length     General Gait Details: improved stability with walker. fatigues fairly easily. O2 sats 82% on RA during ambulation. Dyspnea 3/4   Stairs            Wheelchair Mobility    Modified Rankin (Stroke Patients Only)       Balance                                    Cognition Arousal/Alertness: Awake/alert Behavior During Therapy: Restless Overall Cognitive Status: History of cognitive impairments - at baseline                      Exercises      General Comments        Pertinent Vitals/Pain Pt denies pain    Home Living                      Prior Function            PT Goals (current goals can now be  found in the care plan section) Progress towards PT goals: Progressing toward goals    Frequency  Min 3X/week    PT Plan Current plan remains appropriate    Co-evaluation             End of Session Equipment Utilized During Treatment: Gait belt Activity Tolerance: Patient limited by fatigue Patient left: in chair;with call bell/phone within reach;with chair alarm set     Time: 0951-1003 PT Time Calculation (min): 12 min  Charges:  $Gait Training: 8-22 mins                    G Codes:      Weston Anna, MPT Pager: [phone]

## 2014-03-22 ENCOUNTER — Emergency Department (HOSPITAL_COMMUNITY): Payer: Medicare Other

## 2014-03-22 ENCOUNTER — Ambulatory Visit: Payer: Self-pay | Admitting: Physician Assistant

## 2014-03-22 ENCOUNTER — Encounter (HOSPITAL_COMMUNITY): Payer: Self-pay | Admitting: Emergency Medicine

## 2014-03-22 ENCOUNTER — Inpatient Hospital Stay (HOSPITAL_COMMUNITY)
Admission: EM | Admit: 2014-03-22 | Discharge: 2014-03-26 | DRG: 070 | Disposition: A | Discharge status: Home Health | Payer: Medicare Other | Attending: Internal Medicine | Admitting: Internal Medicine

## 2014-03-22 DIAGNOSIS — J189 Pneumonia, unspecified organism: Secondary | ICD-10-CM | POA: Diagnosis present

## 2014-03-22 DIAGNOSIS — Z7982 Long term (current) use of aspirin: Secondary | ICD-10-CM

## 2014-03-22 DIAGNOSIS — N183 Chronic kidney disease, stage 3 unspecified: Secondary | ICD-10-CM | POA: Diagnosis present

## 2014-03-22 DIAGNOSIS — R0902 Hypoxemia: Secondary | ICD-10-CM | POA: Diagnosis present

## 2014-03-22 DIAGNOSIS — E1029 Type 1 diabetes mellitus with other diabetic kidney complication: Secondary | ICD-10-CM | POA: Diagnosis present

## 2014-03-22 DIAGNOSIS — I4821 Permanent atrial fibrillation: Secondary | ICD-10-CM

## 2014-03-22 DIAGNOSIS — I4891 Unspecified atrial fibrillation: Secondary | ICD-10-CM | POA: Diagnosis present

## 2014-03-22 DIAGNOSIS — I2589 Other forms of chronic ischemic heart disease: Secondary | ICD-10-CM | POA: Diagnosis present

## 2014-03-22 DIAGNOSIS — N182 Chronic kidney disease, stage 2 (mild): Secondary | ICD-10-CM

## 2014-03-22 DIAGNOSIS — Z888 Allergy status to other drugs, medicaments and biological substances status: Secondary | ICD-10-CM

## 2014-03-22 DIAGNOSIS — Z951 Presence of aortocoronary bypass graft: Secondary | ICD-10-CM

## 2014-03-22 DIAGNOSIS — I5023 Acute on chronic systolic (congestive) heart failure: Secondary | ICD-10-CM | POA: Diagnosis present

## 2014-03-22 DIAGNOSIS — G9340 Encephalopathy, unspecified: Principal | ICD-10-CM | POA: Diagnosis present

## 2014-03-22 DIAGNOSIS — R042 Hemoptysis: Secondary | ICD-10-CM

## 2014-03-22 DIAGNOSIS — E785 Hyperlipidemia, unspecified: Secondary | ICD-10-CM | POA: Diagnosis present

## 2014-03-22 DIAGNOSIS — E039 Hypothyroidism, unspecified: Secondary | ICD-10-CM | POA: Diagnosis present

## 2014-03-22 DIAGNOSIS — F3289 Other specified depressive episodes: Secondary | ICD-10-CM | POA: Diagnosis present

## 2014-03-22 DIAGNOSIS — Z95810 Presence of automatic (implantable) cardiac defibrillator: Secondary | ICD-10-CM

## 2014-03-22 DIAGNOSIS — I259 Chronic ischemic heart disease, unspecified: Secondary | ICD-10-CM

## 2014-03-22 DIAGNOSIS — J9621 Acute and chronic respiratory failure with hypoxia: Secondary | ICD-10-CM

## 2014-03-22 DIAGNOSIS — I447 Left bundle-branch block, unspecified: Secondary | ICD-10-CM | POA: Diagnosis present

## 2014-03-22 DIAGNOSIS — R4182 Altered mental status, unspecified: Secondary | ICD-10-CM

## 2014-03-22 DIAGNOSIS — Z87891 Personal history of nicotine dependence: Secondary | ICD-10-CM

## 2014-03-22 DIAGNOSIS — Z8612 Personal history of poliomyelitis: Secondary | ICD-10-CM

## 2014-03-22 DIAGNOSIS — Z794 Long term (current) use of insulin: Secondary | ICD-10-CM

## 2014-03-22 DIAGNOSIS — F0150 Vascular dementia without behavioral disturbance: Secondary | ICD-10-CM | POA: Diagnosis present

## 2014-03-22 DIAGNOSIS — E559 Vitamin D deficiency, unspecified: Secondary | ICD-10-CM | POA: Diagnosis present

## 2014-03-22 DIAGNOSIS — I672 Cerebral atherosclerosis: Secondary | ICD-10-CM | POA: Diagnosis present

## 2014-03-22 DIAGNOSIS — I10 Essential (primary) hypertension: Secondary | ICD-10-CM

## 2014-03-22 DIAGNOSIS — F329 Major depressive disorder, single episode, unspecified: Secondary | ICD-10-CM | POA: Diagnosis present

## 2014-03-22 DIAGNOSIS — Z79899 Other long term (current) drug therapy: Secondary | ICD-10-CM

## 2014-03-22 DIAGNOSIS — I129 Hypertensive chronic kidney disease with stage 1 through stage 4 chronic kidney disease, or unspecified chronic kidney disease: Secondary | ICD-10-CM | POA: Diagnosis present

## 2014-03-22 DIAGNOSIS — N179 Acute kidney failure, unspecified: Secondary | ICD-10-CM | POA: Diagnosis present

## 2014-03-22 DIAGNOSIS — I2510 Atherosclerotic heart disease of native coronary artery without angina pectoris: Secondary | ICD-10-CM | POA: Diagnosis present

## 2014-03-22 DIAGNOSIS — K7460 Unspecified cirrhosis of liver: Secondary | ICD-10-CM | POA: Diagnosis present

## 2014-03-22 DIAGNOSIS — I509 Heart failure, unspecified: Secondary | ICD-10-CM | POA: Diagnosis present

## 2014-03-22 DIAGNOSIS — E876 Hypokalemia: Secondary | ICD-10-CM | POA: Diagnosis present

## 2014-03-22 DIAGNOSIS — Z8547 Personal history of malignant neoplasm of testis: Secondary | ICD-10-CM

## 2014-03-22 DIAGNOSIS — E119 Type 2 diabetes mellitus without complications: Secondary | ICD-10-CM | POA: Diagnosis present

## 2014-03-22 LAB — AMMONIA: Ammonia: 16 umol/L (ref 11–60)

## 2014-03-22 LAB — CBC
HEMATOCRIT: 39.1 % (ref 39.0–52.0)
HEMOGLOBIN: 12.9 g/dL — AB (ref 13.0–17.0)
MCH: 29.5 pg (ref 26.0–34.0)
MCHC: 33 g/dL (ref 30.0–36.0)
MCV: 89.3 fL (ref 78.0–100.0)
Platelets: 156 10*3/uL (ref 150–400)
RBC: 4.38 MIL/uL (ref 4.22–5.81)
RDW: 14.8 % (ref 11.5–15.5)
WBC: 7.8 10*3/uL (ref 4.0–10.5)

## 2014-03-22 LAB — URINE MICROSCOPIC-ADD ON

## 2014-03-22 LAB — COMPREHENSIVE METABOLIC PANEL
ALT: 26 U/L (ref 0–53)
AST: 19 U/L (ref 0–37)
Albumin: 3.3 g/dL — ABNORMAL LOW (ref 3.5–5.2)
Alkaline Phosphatase: 186 U/L — ABNORMAL HIGH (ref 39–117)
BUN: 10 mg/dL (ref 6–23)
CALCIUM: 9 mg/dL (ref 8.4–10.5)
CO2: 24 meq/L (ref 19–32)
CREATININE: 0.96 mg/dL (ref 0.50–1.35)
Chloride: 105 mEq/L (ref 96–112)
GFR, EST AFRICAN AMERICAN: 88 mL/min — AB (ref >90)
GFR, EST NON AFRICAN AMERICAN: 76 mL/min — AB (ref >90)
GLUCOSE: 168 mg/dL — AB (ref 70–99)
Potassium: 4 mEq/L (ref 3.7–5.3)
Sodium: 141 mEq/L (ref 137–147)
TOTAL PROTEIN: 6.3 g/dL (ref 6.0–8.3)
Total Bilirubin: 0.9 mg/dL (ref 0.3–1.2)

## 2014-03-22 LAB — BLOOD GAS, ARTERIAL
ACID-BASE EXCESS: 1.3 mmol/L (ref 0.0–2.0)
Bicarbonate: 25.2 mEq/L — ABNORMAL HIGH (ref 20.0–24.0)
DRAWN BY: 331471
O2 CONTENT: 3 L/min
O2 SAT: 93.5 %
PO2 ART: 65.6 mmHg — AB (ref 80.0–100.0)
Patient temperature: 98.6
TCO2: 22.3 mmol/L (ref 0–100)
pCO2 arterial: 39.2 mmHg (ref 35.0–45.0)
pH, Arterial: 7.424 (ref 7.350–7.450)

## 2014-03-22 LAB — URINALYSIS, ROUTINE W REFLEX MICROSCOPIC
Bilirubin Urine: NEGATIVE
Glucose, UA: 100 mg/dL — AB
Ketones, ur: NEGATIVE mg/dL
LEUKOCYTES UA: NEGATIVE
NITRITE: NEGATIVE
PH: 7.5 (ref 5.0–8.0)
Protein, ur: NEGATIVE mg/dL
SPECIFIC GRAVITY, URINE: 1.008 (ref 1.005–1.030)
Urobilinogen, UA: 0.2 mg/dL (ref 0.0–1.0)

## 2014-03-22 LAB — LACTIC ACID, PLASMA: Lactic Acid, Venous: 1.1 mmol/L (ref 0.5–2.2)

## 2014-03-22 LAB — TROPONIN I
Troponin I: <0.3 ng/mL (ref <0.30)
Troponin I: <0.3 ng/mL (ref <0.30)
Troponin I: <0.3 ng/mL (ref <0.30)

## 2014-03-22 LAB — MRSA PCR SCREENING: MRSA by PCR: NEGATIVE

## 2014-03-22 LAB — GLUCOSE, CAPILLARY
GLUCOSE-CAPILLARY: 138 mg/dL — AB (ref 70–99)
Glucose-Capillary: 120 mg/dL — ABNORMAL HIGH (ref 70–99)

## 2014-03-22 LAB — CBG MONITORING, ED: Glucose-Capillary: 152 mg/dL — ABNORMAL HIGH (ref 70–99)

## 2014-03-22 MED ORDER — INSULIN ASPART 100 UNIT/ML ~~LOC~~ SOLN
0.0000 [IU] | Freq: Three times a day (TID) | SUBCUTANEOUS | Status: DC
  Administered 2014-03-23: 1 [IU] via SUBCUTANEOUS
  Administered 2014-03-23 (×2): 2 [IU] via SUBCUTANEOUS
  Administered 2014-03-24: 3 [IU] via SUBCUTANEOUS
  Administered 2014-03-24 (×2): 2 [IU] via SUBCUTANEOUS
  Administered 2014-03-25: 1 [IU] via SUBCUTANEOUS
  Administered 2014-03-25 (×2): 2 [IU] via SUBCUTANEOUS
  Administered 2014-03-26: 3 [IU] via SUBCUTANEOUS

## 2014-03-22 MED ORDER — DORZOLAMIDE HCL-TIMOLOL MAL 2-0.5 % OP SOLN
1.0000 [drp] | Freq: Two times a day (BID) | OPHTHALMIC | Status: DC
  Administered 2014-03-22 – 2014-03-26 (×8): 1 [drp] via OPHTHALMIC
  Filled 2014-03-22: qty 10

## 2014-03-22 MED ORDER — LEVOTHYROXINE SODIUM 75 MCG PO TABS
75.0000 ug | ORAL_TABLET | Freq: Every day | ORAL | Status: DC
  Administered 2014-03-23 – 2014-03-26 (×4): 75 ug via ORAL
  Filled 2014-03-22 (×5): qty 1

## 2014-03-22 MED ORDER — DONEPEZIL HCL 10 MG PO TABS
10.0000 mg | ORAL_TABLET | Freq: Every day | ORAL | Status: DC
  Administered 2014-03-22 – 2014-03-26 (×5): 10 mg via ORAL
  Filled 2014-03-22 (×5): qty 1

## 2014-03-22 MED ORDER — ENOXAPARIN SODIUM 40 MG/0.4ML ~~LOC~~ SOLN
40.0000 mg | SUBCUTANEOUS | Status: DC
  Administered 2014-03-22 – 2014-03-23 (×2): 40 mg via SUBCUTANEOUS
  Filled 2014-03-22 (×3): qty 0.4

## 2014-03-22 MED ORDER — LOSARTAN POTASSIUM 50 MG PO TABS
100.0000 mg | ORAL_TABLET | Freq: Every day | ORAL | Status: DC
  Administered 2014-03-23: 100 mg via ORAL
  Filled 2014-03-22 (×2): qty 2

## 2014-03-22 MED ORDER — DILTIAZEM HCL 60 MG PO TABS
120.0000 mg | ORAL_TABLET | Freq: Three times a day (TID) | ORAL | Status: DC
  Administered 2014-03-22 – 2014-03-26 (×12): 120 mg via ORAL
  Filled 2014-03-22 (×15): qty 2

## 2014-03-22 MED ORDER — LEVOFLOXACIN 500 MG PO TABS
500.0000 mg | ORAL_TABLET | Freq: Every day | ORAL | Status: DC
  Administered 2014-03-22 – 2014-03-24 (×3): 500 mg via ORAL
  Filled 2014-03-22 (×3): qty 1

## 2014-03-22 MED ORDER — FUROSEMIDE 40 MG PO TABS: 40.0000 mg | ORAL_TABLET | Freq: Every day | ORAL | Status: DC

## 2014-03-22 MED ORDER — LABETALOL HCL 300 MG PO TABS
300.0000 mg | ORAL_TABLET | Freq: Two times a day (BID) | ORAL | Status: DC
  Administered 2014-03-22 – 2014-03-26 (×8): 300 mg via ORAL
  Filled 2014-03-22 (×10): qty 1

## 2014-03-22 MED ORDER — ISOSORBIDE MONONITRATE ER 60 MG PO TB24
90.0000 mg | ORAL_TABLET | Freq: Every day | ORAL | Status: DC
  Administered 2014-03-23 – 2014-03-26 (×4): 90 mg via ORAL
  Filled 2014-03-22 (×4): qty 1

## 2014-03-22 MED ORDER — FUROSEMIDE 10 MG/ML IJ SOLN
40.0000 mg | Freq: Two times a day (BID) | INTRAMUSCULAR | Status: DC
Start: 2014-03-22 — End: 2014-03-23
  Administered 2014-03-22 – 2014-03-23 (×2): 40 mg via INTRAVENOUS
  Filled 2014-03-22 (×5): qty 4

## 2014-03-22 MED ORDER — ASPIRIN 81 MG PO CHEW
81.0000 mg | CHEWABLE_TABLET | Freq: Every day | ORAL | Status: DC
  Administered 2014-03-22 – 2014-03-26 (×5): 81 mg via ORAL
  Filled 2014-03-22 (×5): qty 1

## 2014-03-22 MED ORDER — CITALOPRAM HYDROBROMIDE 20 MG PO TABS
20.0000 mg | ORAL_TABLET | Freq: Every day | ORAL | Status: DC
  Administered 2014-03-23 – 2014-03-26 (×4): 20 mg via ORAL
  Filled 2014-03-22 (×4): qty 1

## 2014-03-22 MED ORDER — LABETALOL HCL 200 MG PO TABS
200.0000 mg | ORAL_TABLET | Freq: Two times a day (BID) | ORAL | Status: DC
  Filled 2014-03-22: qty 1

## 2014-03-22 MED ORDER — SODIUM CHLORIDE 0.9 % IJ SOLN
3.0000 mL | Freq: Two times a day (BID) | INTRAMUSCULAR | Status: DC
  Administered 2014-03-22 – 2014-03-25 (×6): 3 mL via INTRAVENOUS

## 2014-03-22 NOTE — Consult Note (Signed)
CARDIOLOGY CONSULT NOTE   Patient ID: Troy Warren MRN: [id number] DOB/AGE: [birth year] 80 y.o.  Admit date: 03/22/2014  Primary Physician   Unk Pinto DAVID, MD Primary Cardiologist   MCr Reason for Consultation Decreased EF  [id number] Troy Warren is a 80 y.o. male with a history of CAD, ICM w/ EF 30%, perm afib, LBBB, CRT-D w/ EF improved to 55%, no coumadin 2nd hx ICH, HTN and vascular dementia. He was admitted 4/4-->4/9 w/ hemoptysis and came back to the ER 04/10 w/ confusion. Cardiology asked to see because of a decreased EF.  Pt became confused during the previous admission. He seemed SOB by family after discharge and was more confused, wife unable to manage him at home. He denies SOB or chest pain. He is oriented to name and knows hospital. Family says pt had some pain at home, but was unable to say where.    Past Medical History  Diagnosis Date  . Hypertension   . Ischemic heart disease   . Hyperlipemia   . Depression   . Testicular cancer   . Polio     child polio  . Rheumatic fever   . Mild dementia   . Mild dementia   . Systolic heart failure     acute on chronic  . CAD (coronary artery disease)   . Chronic atrial fibrillation   . Vitamin D deficiency   . Type II or unspecified type diabetes mellitus without mention of complication, not stated as uncontrolled   . Other testicular hypofunction   . Permanent atrial fibrillation 01/21/2014  . ICD, biventricular, in situ - Medtronic Protecta 2013, leads 2009 01/21/2014    New Generator Medtronic Troy Warren model number Warren, serial number [id number]  Right atrial lead (chronic) Medtronic, model number [id number], serial [id number] (implanted 05/01/2008)  Right ventricular lead (chronic) Medtronic, model number [id number], serial number [id number] (implanted 05/01/2008)  Left ventricular lead Medtronic, model number [id number], serial number [id number] (implanted 05/01/2008)  Exp     Past Surgical History  Procedure  Laterality Date  . Coronary artery bypass graft  06/26/2002    LIMA to diagonal artery & SVG to the first obtuse margin  . Nm myocar perf wall motion  09/15/2006    Dilated LV w/small area of possible nontransmural inferior scar w/mild perinfarct ischemia.  No significant change from previous study.  . Cardiac defibrillator placement  05/01/2008    Medtronic    Allergies  Allergen Reactions  . Lipitor [Atorvastatin]     weakness    I have reviewed the patient's current medications . aspirin  81 mg Oral Daily  . [START ON 03/23/2014] citalopram  20 mg Oral Daily  . diltiazem  120 mg Oral TID  . donepezil  10 mg Oral Daily  . dorzolamide-timolol  1 drop Both Eyes BID  . enoxaparin (LOVENOX) injection  40 mg Subcutaneous Q24H  . [START ON 03/23/2014] furosemide  40 mg Oral Daily  . insulin aspart  0-9 Units Subcutaneous TID WC  . [START ON 03/23/2014] isosorbide mononitrate  90 mg Oral Daily  . labetalol  200 mg Oral BID  . levofloxacin  500 mg Oral Daily  . [START ON 03/23/2014] levothyroxine  75 mcg Oral QAC breakfast  . [START ON 03/23/2014] losartan  100 mg Oral Daily  . sodium chloride  3 mL Intravenous Q12H       Prior to Admission medications   Medication Sig Start Date End Date Taking? Authorizing Provider  ALPRAZolam (  XANAX) 1 MG tablet Take 0.5 mg by mouth at bedtime. Take every night per wife   Yes Historical Provider, MD  aspirin 81 MG chewable tablet Chew 81 mg by mouth daily.   Yes Historical Provider, MD  Cholecalciferol (VITAMIN D PO) Take 4,000 Int'Troy Units by mouth daily.   Yes Historical Provider, MD  citalopram (CELEXA) 40 MG tablet Take 20 mg by mouth. Takes  1/2 of a 40 mg pill to equal 20 mg   Yes Historical Provider, MD  Cyanocobalamin (VITAMIN B-12 PO) Take 1 tablet by mouth daily.   Yes Historical Provider, MD  diltiazem (CARDIZEM) 120 MG tablet Take 1 tablet (120 mg total) by mouth 3 (three) times daily. 01/21/14  Yes Troy Croitoru, MD  diphenoxylate-atropine  (LOMOTIL) 2.5-0.025 MG per tablet TAKE 1 TABLET BY MOUTH 4 TIMES A DAY AS NEEDED FOR DIARRHEA 01/15/14  Yes Troy R Smith, PA-C  donepezil (ARICEPT) 10 MG tablet Take 1 tablet (10 mg total) by mouth daily. 09/14/13  Yes Troy Pali, NP  dorzolamide-timolol (COSOPT) 22.3-6.8 MG/ML ophthalmic solution Place 1 drop into both eyes 2 (two) times daily.  02/15/13  Yes Historical Provider, MD  ferrous fumarate (HEMOCYTE - 106 MG FE) 325 (106 FE) MG TABS Take 1 tablet by mouth 2 (two) times daily.   Yes Historical Provider, MD  furosemide (LASIX) 40 MG tablet Take 1 tablet (40 mg total) by mouth daily. 05/01/13  Yes Troy Croitoru, MD  insulin glargine (LANTUS) 100 UNIT/ML injection Inject 0.15 mLs (15 Units total) into the skin daily after breakfast. This was changed since CBGs better controlled during Hospital stay, may titrate up as needed after FU with PCP in 1 week if CBGs trending up at home 03/21/14  Yes Domenic Polite, MD  isosorbide mononitrate (IMDUR) 60 MG 24 hr tablet Take 90 mg by mouth daily. Pt takes 1and 1/2 of the 60 mg pill to equal 90 mg.   Yes Historical Provider, MD  labetalol (NORMODYNE) 200 MG tablet Take 200 mg by mouth 2 (two) times daily.   Yes Historical Provider, MD  levofloxacin (LEVAQUIN) 500 MG tablet Take 1 tablet (500 mg total) by mouth daily. For 5 days 03/21/14  Yes Domenic Polite, MD  levothyroxine (SYNTHROID, LEVOTHROID) 75 MCG tablet TAKE 1 TABLET EVERY DAY 10/29/13  Yes Troy R Smith, PA-C  losartan (COZAAR) 100 MG tablet Take 100 mg by mouth daily.  04/03/13  Yes Historical Provider, MD  meloxicam (MOBIC) 15 MG tablet Take 15 mg by mouth as needed for pain.  01/15/14  Yes Historical Provider, MD  Multiple Vitamins-Minerals (ICAPS) CAPS Take 1 capsule by mouth daily.   Yes Historical Provider, MD  potassium chloride (KLOR-CON 10) 10 MEQ tablet TAKE 1 TABLET TWICE DAILY 01/23/14  Yes Troy R Smith, PA-C  pravastatin (PRAVACHOL) 40 MG tablet Take 40 mg by mouth daily.   Yes  Historical Provider, MD     History   Social History  . Marital Status: Married    Spouse Name: N/A    Number of Children: 0  . Years of Education: N/A   Occupational History  . Retired    Social History Main Topics  . Smoking status: Former Smoker    Quit date: 12/24/1968  . Smokeless tobacco: Never Used  . Alcohol Use: No  . Drug Use: No  . Sexual Activity: No   Other Topics Concern  . Not on file   Social History Narrative  . Lives with family  Family Status  Relation Status Death Age  . Mother Deceased   . Father Deceased   . Brother Deceased     drowning    ROS:  Full 14 point review of systems complete and found to be negative unless listed above.  Physical Exam: Blood pressure 215/100, pulse 82, temperature 98 F (36.7 C), temperature source Axillary, resp. rate 20, height 5\' 6"  (1.676 m), weight 162 lb 4.1 oz (73.6 kg), SpO2 95.00%.  General: Well developed, well nourished, male in no acute distress Head: Eyes PERRLA, No xanthomas.   Normocephalic and atraumatic, oropharynx without edema or exudate. Dentition: poor Lungs: bibasilar rales Heart: Heart irregular rate and rhythm with S1, S2;  + murmur. pulses are 2+ all 4 extrem.   Neck: No carotid bruits. No lymphadenopathy.  JVD at 10 cm. Abdomen: Bowel sounds present, abdomen soft and non-tender without masses or hernias noted. Msk:  No spine or cva tenderness. No weakness, no joint deformities or effusions. Extremities: No clubbing or cyanosis. no edema.  Neuro: Alert and oriented X 3. No focal deficits noted. Psych:  Good affect, responds appropriately Skin: No rashes or lesions noted.  Labs:   Lab Results  Component Value Date   WBC 7.8 03/22/2014   HGB 12.9* 03/22/2014   HCT 39.1 03/22/2014   MCV 89.3 03/22/2014   PLT 156 03/22/2014    Recent Labs Lab 03/22/14 1020  NA 141  K 4.0  CL 105  CO2 24  BUN 10  CREATININE 0.96  CALCIUM 9.0  PROT 6.3  BILITOT 0.9  ALKPHOS 186*  ALT 26  AST  19  GLUCOSE 168*  ALBUMIN 3.3*    Recent Labs  03/22/14 1030  TROPONINI <0.30   Pro B Natriuretic peptide (BNP)  Date/Time Value Ref Range Status  03/18/2014  8:50 PM 4169.0* 0 - 450 pg/mL Final  08/04/2011  5:45 AM 3886.0* 0 - 450 pg/mL Final    TSH  Date/Time Value Ref Range Status  03/18/2014  8:50 PM 1.110  0.350 - 4.500 uIU/mL Final     Please note change in reference range.     Performed at [facility]     Echo: 03/19/2014 Study Conclusions - Left ventricle: The cavity size was severely dilated. Wall thickness was increased in a pattern of severe LVH. There was mild concentric hypertrophy. Systolic function was severely reduced. The estimated ejection fraction was in the range of 25% to 30%. Wall motion was normal; there were no regional wall motion abnormalities. Doppler parameters are consistent with a reversible restrictive pattern, indicative of decreased left ventricular diastolic compliance and/or increased left atrial pressure (grade 3 diastolic dysfunction). Doppler parameters are consistent withseverely elevated ventricular end-diastolic filling pressure and elevated left atrial filling pressure. - Aortic valve: Trileaflet; moderately thickened, moderately calcified leaflets. Cusp separation was moderately reduced. There was mild stenosis. Mild regurgitation. - Mitral valve: Mild regurgitation. - Left atrium: The atrium was mildly dilated. - Right ventricle: The cavity size was mildly dilated. Wall thickness was normal. Systolic function was normal. - Tricuspid valve: Mild regurgitation. - Pulmonary arteries: Systolic pressure was mildly increased. PA peak pressure: 75mm Hg (S). - Impressions: Compared to the prior study from 10/10/2009 the left ventricle is now severely dilated with severely impaired ejection fraction and restrictive pattern of diastolic function with severely increased LV filling pressures. LVEF is 25-30%, previosuly reported  as 40-45%. RVSP is now mildly elevated, previously reported as normal. Impressions: - Compared to the prior study from 10/10/2009  the left ventricle is now severely dilated with severely impaired ejection fraction and restrictive pattern of diastolic function with severely increased LV filling pressures. LVEF is 25-30%, previosuly reported as 40-45%. RVSP is now mildly elevated, previously reported as normal.  Echo: 12/10/2009 Study Conclusions - Left ventricle: The cavity size was moderately dilated. Wall thickness was increased in a pattern of mild LVH. Systolic function was mildly to moderately reduced. The estimated ejection fraction was in the range of 40% to 45%. Diffuse hypokinesis. - Aortic valve: Trivial regurgitation. - Left atrium: The atrium was mildly dilated. - Atrial septum: No defect or patent foramen ovale was identified. - Pulmonary arteries: PA peak pressure: 52mm Hg (S).  ECG:  22-Mar-2014 10:22:49   Ventricular-paced rhythm Vent. rate 75 BPM QRS duration 192 ms QT/QTc 507/566 ms P-R-T axes -56 208 31  Radiology:  Ct Head Wo Contrast 03/22/2014   CLINICAL DATA:  Altered mental status.  Weakness in dementia.  EXAM: CT HEAD WITHOUT CONTRAST  TECHNIQUE: Contiguous axial images were obtained from the base of the skull through the vertex without intravenous contrast.  COMPARISON:  01/04/2014  FINDINGS: The brain shows generalized atrophy. There is old infarction in the left middle cerebral artery territory affecting the temporal lobe, with ex vacuo enlargement of the temporal horn of the left lateral ventricle. There are chronic small vessel changes throughout the hemispheric white matter. Chronic small-vessel changes affect the pons. There are old small vessel cerebellar infarctions. No sign of acute infarction, mass lesion, hemorrhage, hydrocephalus or extra-axial collection. No calvarial abnormality. Sinuses, middle ears and mastoids are clear.  IMPRESSION: No acute  finding. 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 Extensive chronic small vessel disease.   Electronically Signed   By: Nelson Chimes M.D.   On: 03/22/2014 11:31   Dg Chest Port 1 View 03/22/2014   CLINICAL DATA:  AMS  EXAM: PORTABLE CHEST - 1 VIEW  COMPARISON:  DG CHEST 2 VIEW dated 03/19/2014  FINDINGS: Stable cardiomegaly. Stable small bilateral effusions. There is decrease prominence of the pulmonary edema. The right perihilar opacity is also decreased in conspicuity. No new focal region consolidation. Left chest wall dual chamber AICD appreciated. No acute osseous abnormalities.  IMPRESSION: Improving pulmonary edema. No new focal regions of consolidation. Persistent small bilateral effusions.   Electronically Signed   By: Margaree Mackintosh M.D.   On: 03/22/2014 11:46    ASSESSMENT AND PLAN:   The patient was seen today by Dr. Gwenlyn Found, the patient evaluated and the data reviewed.  Principal Problem:   Acute encephalopathy Active Problems:   Dementia, vascular   Hypertension   Ischemic heart disease   Hyperlipemia   CAD (coronary artery disease)   Type II or unspecified type diabetes mellitus without mention of complication, not stated as uncontrolled   Permanent atrial fibrillation   ICD, biventricular, in situ - Medtronic Protecta 2013, leads 2009   Altered mental status   Acute on chronic systolic heart failure   Hypothyroidism   Signed: Lonn Georgia, PA-C 03/22/2014 5:11 PM Beeper [id number]  Co-Sign MD   Agree with note by Rosaria Ferries PA-C. ATSP with disorientation and progressive dementia recently admitted for hemoptysis. He was being Rx for CAP. His Hx is notable for ISCM s/p remote CABG followed by Drs Rollene Fare and Little in the past and more recently Dr. Sallyanne Kuster. He has CAF not on coumadin secondary ti IC bleed. His baseline EF was in the 30 % range  prior to insertion of a Biv-ICD. He was a remarkable CRT responder with  LBBB. His EF apparently rose to 55-60% but now is back down to 30% for unclear reasons. His daughter says that he has been SOB but pt denies this. He had interstitial edema on CXR and increased BNP. He has also been hypertensive. I think this is all true true and unrelated to his confusion but nevertheless needs to be treated. Will give IV lasix and the transition back to an increased PO dose. Will get Device rep to evaluate and interrogate. We should also get EP to see.  Lorretta Harp, M.D., [place], [facility], [place] [address] [place], [place]  [id number]  [phone] 03/22/2014 6:35 PM

## 2014-03-22 NOTE — H&P (Signed)
History and Physical    Troy Warren [id number] DOB: January 05, 1931 DOA: 03/22/2014  Referring physician: Dr. Doy Mince PCP: Alesia Richards, MD  Specialists: cardiology  Chief Complaint: AMS  HPI: Troy Warren is a 80 y.o. male has a past medical history significant for dementia, CAD s/p CABG, ischemic cardiomyopathy-but with improvement in his ejection fraction to 60-65% following CRT per his cardiologist, Dr. Sallyanne Kuster, status post biventricular ICD, atrial fibrillation (on aspirin), and vascular dementia, is being brought to the ED by family due to confusion, lethargy, decreased responsiveness and worsening mental status. Patient is drowsy on my evaluation but wakes up and has no idea why he is in the hospital. Denies chest pain, SOB, abdominal pain, denies fever/chills and is otherwise asymptomatic. He was hospitalized 4/6 to 4/9 with hemoptysis in the setting of Pneumonia vs lung mass, improved and discharged home stable on Levofloxacin. Patient cannot relate much as to what happened in the past day. I discussed with his daughter, and she tells me that patient, after leaving the hospital went to a motel where currently his wife is staying, because their house is undergoing repairs. He seemed a bit more sleepy and confused last night, and they called the PCPs office to set up an appointment for this morning. However, towards the early hours of the morning, he was so lethargic that they couldn't wake him and decided to bring him to the emergency room. In the ED, patient hypertensive but otherwise his blood work is unremarkable, chest x-ray shows improvement in his airspace disease, and urinalysis unremarkable. Because of lethargy, will monitor patient to step down overnight.   Review of Systems: as per HPI however in the setting of AMS  Past Medical History  Diagnosis Date  . Hypertension   . Ischemic heart disease   . Hyperlipemia   . Depression   . Testicular cancer   .  Polio     child polio  . Rheumatic fever   . Mild dementia   . Mild dementia   . Systolic heart failure     acute on chronic  . CAD (coronary artery disease)   . Chronic atrial fibrillation   . Vitamin D deficiency   . Type II or unspecified type diabetes mellitus without mention of complication, not stated as uncontrolled   . Other testicular hypofunction   . Permanent atrial fibrillation 01/21/2014  . ICD, biventricular, in situ - Medtronic Protecta 2013, leads 2009 01/21/2014    New Generator Medtronic [place] model number [id number], serial number [id number]  Right atrial lead (chronic) Medtronic, model number [id number], serial [id number] (implanted 05/01/2008)  Right ventricular lead (chronic) Medtronic, model number [id number], serial number [id number] (implanted 05/01/2008)  Left ventricular lead Medtronic, model number [id number], serial number [id number] (implanted 05/01/2008)  Exp   Past Surgical History  Procedure Laterality Date  . Coronary artery bypass graft  06/26/2002    LIMA to diagonal artery & SVG to the first obtuse margin  . Nm myocar perf wall motion  09/15/2006    Dilated LV w/small area of possible nontransmural inferior scar w/mild perinfarct ischemia.  No significant change from previous study.  . Cardiac defibrillator placement  05/01/2008    Medtronic   Social History:  reports that he quit smoking about 45 years ago. He has never used smokeless tobacco. He reports that he does not drink alcohol or use illicit drugs.  Allergies  Allergen Reactions  . Lipitor [Atorvastatin]     weakness    History reviewed.  No pertinent family history.   Prior to Admission medications   Medication Sig Start Date End Date Taking? Authorizing Provider  ALPRAZolam Duanne Moron) 1 MG tablet Take 0.5 mg by mouth at bedtime. Take every night per wife   Yes Historical Provider, MD  aspirin 81 MG chewable tablet Chew 81 mg by mouth daily.   Yes Historical Provider, MD  Cholecalciferol (VITAMIN D PO) Take  4,000 Int'l Units by mouth daily.   Yes Historical Provider, MD  citalopram (CELEXA) 40 MG tablet Take 20 mg by mouth. Takes  1/2 of a 40 mg pill to equal 20 mg   Yes Historical Provider, MD  Cyanocobalamin (VITAMIN B-12 PO) Take 1 tablet by mouth daily.   Yes Historical Provider, MD  diltiazem (CARDIZEM) 120 MG tablet Take 1 tablet (120 mg total) by mouth 3 (three) times daily. 01/21/14  Yes Mihai Croitoru, MD  diphenoxylate-atropine (LOMOTIL) 2.5-0.025 MG per tablet TAKE 1 TABLET BY MOUTH 4 TIMES A DAY AS NEEDED FOR DIARRHEA 01/15/14  Yes Melissa R Smith, PA-C  donepezil (ARICEPT) 10 MG tablet Take 1 tablet (10 mg total) by mouth daily. 09/14/13  Yes Philmore Pali, NP  dorzolamide-timolol (COSOPT) 22.3-6.8 MG/ML ophthalmic solution Place 1 drop into both eyes 2 (two) times daily.  02/15/13  Yes Historical Provider, MD  ferrous fumarate (HEMOCYTE - 106 MG FE) 325 (106 FE) MG TABS Take 1 tablet by mouth 2 (two) times daily.   Yes Historical Provider, MD  furosemide (LASIX) 40 MG tablet Take 1 tablet (40 mg total) by mouth daily. 05/01/13  Yes Mihai Croitoru, MD  insulin glargine (LANTUS) 100 UNIT/ML injection Inject 0.15 mLs (15 Units total) into the skin daily after breakfast. This was changed since CBGs better controlled during Hospital stay, may titrate up as needed after FU with PCP in 1 week if CBGs trending up at home 03/21/14  Yes Domenic Polite, MD  isosorbide mononitrate (IMDUR) 60 MG 24 hr tablet Take 90 mg by mouth daily. Pt takes 1and 1/2 of the 60 mg pill to equal 90 mg.   Yes Historical Provider, MD  labetalol (NORMODYNE) 200 MG tablet Take 200 mg by mouth 2 (two) times daily.   Yes Historical Provider, MD  levofloxacin (LEVAQUIN) 500 MG tablet Take 1 tablet (500 mg total) by mouth daily. For 5 days 03/21/14  Yes Domenic Polite, MD  levothyroxine (SYNTHROID, LEVOTHROID) 75 MCG tablet TAKE 1 TABLET EVERY DAY 10/29/13  Yes Melissa R Smith, PA-C  losartan (COZAAR) 100 MG tablet Take 100 mg by mouth daily.   04/03/13  Yes Historical Provider, MD  meloxicam (MOBIC) 15 MG tablet Take 15 mg by mouth as needed for pain.  01/15/14  Yes Historical Provider, MD  Multiple Vitamins-Minerals (ICAPS) CAPS Take 1 capsule by mouth daily.   Yes Historical Provider, MD  potassium chloride (KLOR-CON 10) 10 MEQ tablet TAKE 1 TABLET TWICE DAILY 01/23/14  Yes Melissa R Smith, PA-C  pravastatin (PRAVACHOL) 40 MG tablet Take 40 mg by mouth daily.   Yes Historical Provider, MD   Physical Exam: Filed Vitals:   03/22/14 1416 03/22/14 1430 03/22/14 1500 03/22/14 1530  BP: 192/81 180/90 186/77 155/77  Pulse: 74 70 75 76  Temp: 98.7 F (37.1 C)     TempSrc: Axillary     Resp: 14 16 26 23   SpO2: 98% 98% 96% 95%     General:  No apparent distress  Eyes: PERRL  ENT: moist oropharynx  Neck: supple, no JVD  Cardiovascular:  regular rate without MRG; 2+ peripheral pulses  Respiratory: poor respiratory effort, no wheezing  Abdomen: soft, non tender to palpation, positive bowel sounds, no guarding, no rebound  Skin: no rashes  Musculoskeletal: no peripheral edema  Psychiatric: normal mood and affect  Neurologic: non focal  Labs on Admission:  Basic Metabolic Panel:  Recent Labs Lab 03/18/14 1545 03/19/14 0530 03/20/14 0357 03/22/14 1020  NA 145 144 139 141  K 3.8 3.4* 3.4* 4.0  CL 107 102 102 105  CO2 27 30 26 24   GLUCOSE 99 65* 115* 168*  BUN 16 14 13 10   CREATININE 1.15 1.02 1.13 0.96  CALCIUM 8.9 9.3 8.4 9.0   Liver Function Tests:  Recent Labs Lab 03/19/14 0530 03/20/14 0357 03/22/14 1020  AST 261* 71* 19  ALT 85* 59* 26  ALKPHOS 269* 218* 186*  BILITOT 3.4* 2.1* 0.9  PROT 6.9 5.5* 6.3  ALBUMIN 3.6 2.9* 3.3*   CBC:  Recent Labs Lab 03/18/14 1545 03/19/14 0530 03/20/14 0357 03/22/14 1020  WBC 7.9 8.1 7.4 7.8  HGB 13.2 13.9 11.8* 12.9*  HCT 39.7 42.0 35.4* 39.1  MCV 90.6 90.3 88.9 89.3  PLT 151 153 146* 156   Cardiac Enzymes:  Recent Labs Lab 03/22/14 1030  TROPONINI  <0.30    BNP (last 3 results)  Recent Labs  03/18/14 2050  PROBNP 4169.0*   CBG:  Recent Labs Lab 03/20/14 1137 03/20/14 1605 03/20/14 2106 03/21/14 0729 03/22/14 1032  GLUCAP 149* 156* 212* 171* 152*    Radiological Exams on Admission: Ct Head Wo Contrast  03/22/2014   CLINICAL DATA:  Altered mental status.  Weakness in dementia.  EXAM: CT HEAD WITHOUT CONTRAST  TECHNIQUE: Contiguous axial images were obtained from the base of the skull through the vertex without intravenous contrast.  COMPARISON:  01/04/2014  FINDINGS: The brain shows generalized atrophy. There is old infarction in the left middle cerebral artery territory affecting the temporal lobe, with ex vacuo enlargement of the temporal horn of the left lateral ventricle. There are chronic small vessel changes throughout the hemispheric white matter. Chronic small-vessel changes affect the pons. There are old small vessel cerebellar infarctions. No sign of acute infarction, mass lesion, hemorrhage, hydrocephalus or extra-axial collection. No calvarial abnormality. Sinuses, middle ears and mastoids are clear.  IMPRESSION: No acute finding. 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 Extensive chronic small vessel disease.   Electronically Signed   By: Nelson Chimes M.D.   On: 03/22/2014 11:31   Dg Chest Port 1 View  03/22/2014   CLINICAL DATA:  AMS  EXAM: PORTABLE CHEST - 1 VIEW  COMPARISON:  DG CHEST 2 VIEW dated 03/19/2014  FINDINGS: Stable cardiomegaly. Stable small bilateral effusions. There is decrease prominence of the pulmonary edema. The right perihilar opacity is also decreased in conspicuity. No new focal region consolidation. Left chest wall dual chamber AICD appreciated. No acute osseous abnormalities.  IMPRESSION: Improving pulmonary edema. No new focal regions of consolidation. Persistent small bilateral effusions.   Electronically Signed   By: Margaree Mackintosh M.D.   On:  03/22/2014 11:46    EKG: Independently reviewed.  Assessment/Plan Principal Problem:   Acute encephalopathy Active Problems:   Dementia, vascular   Hypertension   Ischemic heart disease   Hyperlipemia   CAD (coronary artery disease)   Type II or unspecified type diabetes mellitus without mention of complication, not stated as uncontrolled   Permanent atrial fibrillation   ICD, biventricular,  in situ - Medtronic Protecta 2013, leads 2009   Altered mental status   Acute on chronic systolic heart failure   Hypothyroidism   Acute encephalopathy - unclear etiology at this point. I wonder this is somewhat chronic, as after arrival to step down unit patient is very awake and interactive with family members. Continues to be confused which he is at baseline given his history of dementia. Given the findings of cirrhosis on previous imaging, check ammonia level. Chronic Systolic heart failure - patient had a 2-D echo last hospitalization which resulted after discharge, showing an EF of 25-30%, which is a new decrease in ejection fraction compared to his previous most recent known EF as per cardiology. I consulted cardiology. We'll check troponins x3 overnight. Continue Lasix. Hypertension - resume home medications, appreciate cardiology input. Type 2 diabetes - she was noted to have well controlled sugars during his previous hospitalization without the need for Lantus, continue sliding scale for now. Atrial fibrillation - continue Cardizem. He is on aspirin. Liver cirrhosis - check ammonia level. His bili was a little bit elevated but is now normal. INR was normal.  Status post bIV-ICD CAD - continue home medications Vascular dementia - continue home medications Hypothyroidism - TSH was normal 2 days ago. Continue Synthroid. Possible pneumonia during last hospitalization  - continue levofloxacin  Hyperlipidemia    Diet: NPO, pending SLP evaluation Fluids: none DVT Prophylaxis:  Lovenox  Code Status: Full (readdressed with POA on admission)  Family Communication: daughter bedside  Disposition Plan: inpatient  Time spent: 35  This note has been created with Surveyor, quantity. Any transcriptional errors are unintentional.   Costin M. Cruzita Lederer, MD [facility] Pager [phone]  If 7PM-7AM, please contact night-coverage [email or web address] Password [email or web address] 03/22/2014, 4:16 PM

## 2014-03-22 NOTE — Discharge Summary (Addendum)
Physician Discharge Summary  Troy Warren IHK:[id number] DOB: [birth year] DOA: 03/18/2014  PCP: Alesia Richards, MD  Admit date: 03/18/2014 Discharge date: 03/21/2014  Time spent: 31minutes  Recommendations for Outpatient Follow-up:  1. PCP in 1 week 2. Pulm Dr.Clance on 4/29 3. Repeat CT chest in 4weeks to determine need for Biopsy of this interdeterminate pneumonia vs mass 4. Please Fu on 2D ECHO  Discharge Diagnoses:  Principal Problem:   Hemoptysis Active Problems:   Dementia, vascular   Hypertension   Ischemic heart disease   Type II or unspecified type diabetes mellitus without mention of complication, not stated as uncontrolled   Permanent atrial fibrillation   ICD, biventricular, in situ - Medtronic Protecta 2013, leads 2009   Acute on chronic respiratory failure with hypoxia   Pleural effusion, right   Lung density on x-ray   Emphysema of lung   Discharge Condition: stable  Diet recommendation: heart healthy  Filed Weights   03/19/14 0615 03/20/14 0520 03/21/14 0430  Weight: 77.4 kg (170 lb 10.2 oz) 78.744 kg (173 lb 9.6 oz) 77.61 kg (171 lb 1.6 oz)    History of present illness:  Troy Warren is a 80 y.o. male with a history of ischemic heart disease-status post CABG, ischemic cardiomyopathy-but with improvement in his ejection fraction to 60-65% following CRT per his cardiologist, Dr. Sallyanne Kuster, status post biventricular ICD, atrial fibrillation (on aspirin), and vascular dementia, who presents with a complaint of shortness of breath and coughing up of blood. The history is actually being provided by the patient's wife. He has no complaints at all and is not sure why he is in the emergency department. According to Mrs. Quillin, the patient has had worsening shortness of breath with activity. He does not normally need oxygen during the day, but he wears oxygen at night, 2 L. There has been no orthopnea or PND symptoms. He has had a intermittent dry and  productive cough with clear sputum. However, this morning, when he coughed there was a moderate amount of red blood in the sputum according to Mrs. Riva. The patient was unaware of this. He denies fever, chills, chest pain, pleurisy, or night sweats. He denies swelling in his legs. He denies nausea, vomiting, or abdominal pain. He denies bloody stools or black tarry stools.  In the emergency department, he is afebrile and hemodynamically stable, but hypertensive. His oxygen saturation was 84% on room air. His lab data are virtually normal. CT angiogram of his chest revealed no evidence of acute pulmonary embolism; emphysema changes in both lungs; abnormal interstitial density in the right perihilar region and interstitial and alveolar density in the right lower lobe worrisome for pneumonia or malignancy"; enlarged mediastinal and right hilar lymph nodes; moderate size right pleural effusion; status post CABG changes. He is being admitted for further evaluation and management.   Hospital Course:  1. Pneumonia vs lung mass  - Dr.Samtani d/w Dr.Alva PCCM who reviewed CT and favored pneumonia vs mass  -plan to treat with course of Abx then repeat CT chest in 4weeks to determine need for biopsy if mass unchanged despite treatment  -made FU with Dr.Clance for end of April  -has risk factors for malignancy, former smoker   2. Hemoptysis  -due to 1, resolved   3. Liver cirrhosis  -noted on imaging, no h/o alcoholism  -further workup as outpatient, per PCP/GI   4. CAD  -stable, continue labetalol/imdur   5. Chronic atrial fibrillation.  -He is treated with aspirin and  is not on anticoagulation. His rate is currently controlled on diltiazem   6. chronic systolic heart failure-BNP elevated at 4000.  -baseline EF 35-40% -echo done -pcp will need to FU on this  -clinically stable -continue PO lasix  7. Type 2 diabetes mellitus-stable, treated with SSI inpatient  8. Vascular dementia-continue  Aricept 10 mg    -stable   -alert, awake, talkative but confused at baseline     Discharge Exam: Filed Vitals:   03/21/14 0630  BP: 165/65  Pulse:   Temp:   Resp:     General: AAOx to self only, confused Cardiovascular: S1S2/RRR Respiratory: CTAB  Discharge Instructions You were cared for by a hospitalist during your hospital stay. If you have any questions about your discharge medications or the care you received while you were in the hospital after you are discharged, you can call the unit and asked to speak with the hospitalist on call if the hospitalist that took care of you is not available. Once you are discharged, your primary care physician will handle any further medical issues. Please note that NO REFILLS for any discharge medications will be authorized once you are discharged, as it is imperative that you return to your primary care physician (or establish a relationship with a primary care physician if you do not have one) for your aftercare needs so that they can reassess your need for medications and monitor your lab values.  Discharge Orders   Future Appointments Provider Department Dept Phone   04/10/2014 11:00 AM Kathee Delton, MD [facility] [phone]   04/10/2014 11:45 AM Unk Pinto, MD [facility] [phone]   04/24/2014 8:35 AM [facility] [place] [facility] [phone]   Future Orders Complete By Expires   Diet - low sodium heart healthy  As directed    Increase activity slowly  As directed        Medication List    STOP taking these medications       aspirin 81 MG tablet      TAKE these medications       ALPRAZolam 1 MG tablet  Commonly known as:  XANAX  Take 0.5 mg by mouth at bedtime. Take every night per wife     citalopram 40 MG tablet  Commonly known as:  CELEXA  Take 20 mg by mouth. Takes  1/2 of a 40 mg pill to equal 20 mg     diltiazem 120 MG tablet   Commonly known as:  CARDIZEM  Take 1 tablet (120 mg total) by mouth 3 (three) times daily.     diphenoxylate-atropine 2.5-0.025 MG per tablet  Commonly known as:  LOMOTIL  TAKE 1 TABLET BY MOUTH 4 TIMES A DAY AS NEEDED FOR DIARRHEA     donepezil 10 MG tablet  Commonly known as:  ARICEPT  Take 1 tablet (10 mg total) by mouth daily.     dorzolamide-timolol 22.3-6.8 MG/ML ophthalmic solution  Commonly known as:  COSOPT  Place 1 drop into both eyes 2 (two) times daily.     ferrous fumarate 325 (106 FE) MG Tabs tablet  Commonly known as:  HEMOCYTE - 106 mg FE  Take 1 tablet by mouth 2 (two) times daily.     furosemide 40 MG tablet  Commonly known as:  LASIX  Take 1 tablet (40 mg total) by mouth daily.     ICAPS Caps  Take 1 capsule by mouth daily.  insulin glargine 100 UNIT/ML injection  Commonly known as:  LANTUS  Inject 0.15 mLs (15 Units total) into the skin daily after breakfast. This was changed since CBGs better controlled during Hospital stay, may titrate up as needed after FU with PCP in 1 week if CBGs trending up at home     isosorbide mononitrate 60 MG 24 hr tablet  Commonly known as:  IMDUR  Take 90 mg by mouth daily. Pt takes 1and 1/2 of the 60 mg pill to equal 90 mg.     labetalol 200 MG tablet  Commonly known as:  NORMODYNE  Take 200 mg by mouth 2 (two) times daily.     levofloxacin 500 MG tablet  Commonly known as:  LEVAQUIN  Take 1 tablet (500 mg total) by mouth daily. For 5 days     levothyroxine 75 MCG tablet  Commonly known as:  SYNTHROID, LEVOTHROID  TAKE 1 TABLET EVERY DAY     losartan 100 MG tablet  Commonly known as:  COZAAR  Take 100 mg by mouth daily.     meloxicam 15 MG tablet  Commonly known as:  MOBIC  Take 15 mg by mouth as needed for pain.     potassium chloride 10 MEQ tablet  Commonly known as:  KLOR-CON 10  TAKE 1 TABLET TWICE DAILY     pravastatin 40 MG tablet  Commonly known as:  PRAVACHOL  Take 40 mg by mouth daily.      VITAMIN B-12 PO  Take 1 tablet by mouth daily.     VITAMIN D PO  Take 4,000 Int'l Units by mouth daily.       Allergies  Allergen Reactions  . Lipitor [Atorvastatin]     weakness       Follow-up Information   Follow up with Alesia Richards, MD. Schedule an appointment as soon as possible for a visit in 1 week.   Specialty:  Internal Medicine   Contact information:   [address] [place] [address] [phone]        The results of significant diagnostics from this hospitalization (including imaging, microbiology, ancillary and laboratory) are listed below for reference.    Significant Diagnostic Studies: Dg Chest 2 View  03/19/2014   CLINICAL DATA:  Shortness of breath.  Question pneumonia.  EXAM: CHEST  2 VIEW  COMPARISON:  CT and plain film of the chest 03/18/2014.  FINDINGS: There is cardiomegaly. Small bilateral pleural effusions are identified. There is interstitial pulmonary edema. Airspace opacity in the right lower lung zone is now seen. Right perihilar airspace opacity is unchanged.  IMPRESSION: Cardiomegaly interstitial edema.  Right perihilar opacity is again seen. Right lower lobe airspace disease has worsened. Findings could be due to progressive pneumonia and/or edema. Recommend followup films to clearing.   Electronically Signed   By: Inge Rise M.D.   On: 03/19/2014 14:49   Dg Chest 2 View  03/18/2014   CLINICAL DATA:  Hemoptysis  EXAM: CHEST  2 VIEW  COMPARISON:  10/23/2012  FINDINGS: Cardiomegaly is noted. Cardiac pacemaker and AICD leads are unchanged in position. There is right perihilar infiltrate or infiltrative process. Further evaluation with enhanced CT scan is recommended. Small left pleural effusion. Osteopenia and degenerative changes thoracic spine. No pulmonary edema. Status post median sternotomy.  IMPRESSION: Cardiomegaly. Status post median sternotomy. Right perihilar infiltrate or infiltrative process. Further  evaluation with CT scan is recommended. Small left pleural effusion. No pulmonary edema.   Electronically Signed  By: Lahoma Crocker M.D.   On: 03/18/2014 16:23   Ct Head Wo Contrast  03/22/2014   CLINICAL DATA:  Altered mental status.  Weakness in dementia.  EXAM: CT HEAD WITHOUT CONTRAST  TECHNIQUE: Contiguous axial images were obtained from the base of the skull through the vertex without intravenous contrast.  COMPARISON:  01/04/2014  FINDINGS: The brain shows generalized atrophy. There is old infarction in the left middle cerebral artery territory affecting the temporal lobe, with ex vacuo enlargement of the temporal horn of the left lateral ventricle. There are chronic small vessel changes throughout the hemispheric white matter. Chronic small-vessel changes affect the pons. There are old small vessel cerebellar infarctions. No sign of acute infarction, mass lesion, hemorrhage, hydrocephalus or extra-axial collection. No calvarial abnormality. Sinuses, middle ears and mastoids are clear.  IMPRESSION: No acute finding. 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 Extensive chronic small vessel disease.   Electronically Signed   By: Nelson Chimes M.D.   On: 03/22/2014 11:31   Ct Angio Chest Pe W/cm &/or Wo Cm  03/18/2014   CLINICAL DATA:  Chest pain and shortness of breath.  EXAM: CT ANGIOGRAPHY CHEST WITH CONTRAST  TECHNIQUE: Multidetector CT imaging of the chest was performed using the standard protocol during bolus administration of intravenous contrast. Multiplanar CT image reconstructions and MIPs were obtained to evaluate the vascular anatomy. Right perihilar abnormality on today's chest x-ray  CONTRAST:  154mL OMNIPAQUE IOHEXOL 350 MG/ML SOLN  COMPARISON:  DG CHEST 2 VIEW dated 03/18/2014  FINDINGS: Contrast within the pulmonary arterial tree is normal in appearance. There are no filling defects to suggest an acute pulmonary embolism. The caliber of the  thoracic aorta is normal. There is limited contrast present in the thoracic aorta and thus it is difficult to totally exclude a false a lumen. The cardiac chambers are enlarged. There are coronary artery calcifications present. A permanent pacemaker/defibrillator is present.  There are enlarged right hilar as well as mediastinal lymph nodes. A right hilar lymph node on image 47 of series 4 measures 1.8 cm in short axis. A second node at the same level more posteriorly measures 1.6 cm. A precarinal lymph node on image 36 of series 4 measures 1.9 cmis. A node just lateral to the proximal left main pulmonary artery measures 1.4 cm. A lymph node posterior to the right main pulmonary artery measures 1.5 cm in short axis. The thoracic esophagus is not abnormally distended. The thyroid gland appears heterogeneous in density.  There is a moderate-sized right pleural effusion layering posteriorly. There is increased interstitial density in the right upper lobe centered in the perihilar region and to a lesser extent in the right lower lobe. There is confluent parenchymal density in the right lower lobe more inferiorly adjacent to the pleural effusion. On the left there is no significant pleural effusion, but there is minimal posterior basal atelectasis. Both lungs demonstrate emphysematous changes.  Within the upper abdomen the observed portions of the liver and spleen appear normal.  The thoracic vertebral bodies are preserved in height. There is degenerative disc changes in the mid and lower thoracic spine.  Review of the MIP images confirms the above findings.  IMPRESSION: 1. There is no evidence of an acute pulmonary embolism. 2. There are emphysematous changes in both lungs. However, abnormal interstitial density in the right perihilar region and interstitial and alveolar density in the right lower lobe is present and worrisome for pneumonia or malignancy.  3. There are enlarged mediastinal and right hilar lymph nodes.  There is a moderate-sized right pleural effusion. 4. There are post CABG changes.  The cardiac chambers are enlarged. 5. These results were called by telephone at the time of interpretation on 03/18/2014 at 5:41 PM to Dr. Murlean Caller , who verbally acknowledged these results.   Electronically Signed   By: David  Martinique   On: 03/18/2014 17:41   [place] Chest  03/19/2014   CLINICAL DATA:  Right pleural fluid.  EXAM: CHEST ULTRASOUND  COMPARISON:  None.  FINDINGS: Chest sonography revealed a small right pleural effusion layering in the most dependent portion of the chest. There could be other loculated fluid more superiorly. There appears to be more fluid on chest CT than was observed on sonography. Findings were reviewed realtime by the physician assistant.  IMPRESSION: Small dependent right pleural effusion.   Electronically Signed   By: Rolla Flatten M.D.   On: 03/19/2014 13:09   US Abdomen Limited  03/19/2014   CLINICAL DATA:  Elevated LFTs  EXAM: US ABDOMEN LIMITED - RIGHT UPPER QUADRANT  COMPARISON:  01/19/2011  FINDINGS: Gallbladder:  Surgically removed  Common bile duct:  Diameter: 7.4 mm which is within normal limits for the patient's age and post cholecystectomy state.  Liver:  Lobular nature likely related to underlying cirrhotic change. No focal mass lesion is seen.  IMPRESSION: Changes in the liver suggestive of cirrhosis. No focal mass lesion is noted.  Status post cholecystectomy.   Electronically Signed   By: Inez Catalina M.D.   On: 03/19/2014 09:53   Dg Chest Port 1 View  03/22/2014   CLINICAL DATA:  AMS  EXAM: PORTABLE CHEST - 1 VIEW  COMPARISON:  DG CHEST 2 VIEW dated 03/19/2014  FINDINGS: Stable cardiomegaly. Stable small bilateral effusions. There is decrease prominence of the pulmonary edema. The right perihilar opacity is also decreased in conspicuity. No new focal region consolidation. Left chest wall dual chamber AICD appreciated. No acute osseous abnormalities.  IMPRESSION: Improving pulmonary  edema. No new focal regions of consolidation. Persistent small bilateral effusions.   Electronically Signed   By: Margaree Mackintosh M.D.   On: 03/22/2014 11:46    Microbiology: Recent Results (from the past 240 hour(s))  MRSA PCR SCREENING     Status: None   Collection Time    03/18/14  9:04 PM      Result Value Ref Range Status   MRSA by PCR NEGATIVE  NEGATIVE Final   Comment:            The GeneXpert MRSA Assay (FDA     approved for NASAL specimens     only), is one component of a     comprehensive MRSA colonization     surveillance program. It is not     intended to diagnose MRSA     infection nor to guide or     monitor treatment for     MRSA infections.  CULTURE, BLOOD (ROUTINE X 2)     Status: None   Collection Time    03/19/14  1:00 PM      Result Value Ref Range Status   Specimen Description BLOOD RIGHT ARM   Final   Special Requests BOTTLES DRAWN AEROBIC AND ANAEROBIC 5CC   Final   Culture  Setup Time     Final   Value: 03/19/2014 16:10     Performed at [place]     Final  Value:        BLOOD CULTURE RECEIVED NO GROWTH TO DATE CULTURE WILL BE HELD FOR 5 DAYS BEFORE ISSUING A FINAL NEGATIVE REPORT     Performed at [facility]   Report Status PENDING   Incomplete  CULTURE, BLOOD (ROUTINE X 2)     Status: None   Collection Time    03/19/14  1:05 PM      Result Value Ref Range Status   Specimen Description BLOOD LEFT ARM   Final   Special Requests BOTTLES DRAWN AEROBIC AND ANAEROBIC [place]   Final   Culture  Setup Time     Final   Value: 03/19/2014 16:10     Performed at [facility]   Culture     Final   Value:        BLOOD CULTURE RECEIVED NO GROWTH TO DATE CULTURE WILL BE HELD FOR 5 DAYS BEFORE ISSUING A FINAL NEGATIVE REPORT     Performed at [facility]   Report Status PENDING   Incomplete     Labs: Basic Metabolic Panel:  Recent Labs Lab 03/18/14 1545 03/19/14 0530 03/20/14 0357 03/22/14 1020  NA 145 144 139  141  K 3.8 3.4* 3.4* 4.0  CL 107 102 102 105  CO2 27 30 26 24   GLUCOSE 99 65* 115* 168*  BUN 16 14 13 10   CREATININE 1.15 1.02 1.13 0.96  CALCIUM 8.9 9.3 8.4 9.0   Liver Function Tests:  Recent Labs Lab 03/19/14 0530 03/20/14 0357 03/22/14 1020  AST 261* 71* 19  ALT 85* 59* 26  ALKPHOS 269* 218* 186*  BILITOT 3.4* 2.1* 0.9  PROT 6.9 5.5* 6.3  ALBUMIN 3.6 2.9* 3.3*   No results found for this basename: LIPASE, AMYLASE,  in the last 168 hours No results found for this basename: AMMONIA,  in the last 168 hours CBC:  Recent Labs Lab 03/18/14 1545 03/19/14 0530 03/20/14 0357 03/22/14 1020  WBC 7.9 8.1 7.4 7.8  HGB 13.2 13.9 11.8* 12.9*  HCT 39.7 42.0 35.4* 39.1  MCV 90.6 90.3 88.9 89.3  PLT 151 153 146* 156   Cardiac Enzymes:  Recent Labs Lab 03/22/14 1030  TROPONINI <0.30   BNP: BNP (last 3 results)  Recent Labs  03/18/14 2050  PROBNP 4169.0*   CBG:  Recent Labs Lab 03/20/14 1137 03/20/14 1605 03/20/14 2106 03/21/14 0729 03/22/14 1032  GLUCAP 149* 156* 212* 171* 152*       Signed:  Domenic Polite  [facility] 03/22/2014, 2:25 PM

## 2014-03-22 NOTE — ED Provider Notes (Signed)
CSN: [id number]     Arrival date & time 03/22/14  1006 History   First MD Initiated Contact with Patient 03/22/14 1015     Chief Complaint  Patient presents with  . Altered Mental Status     (Consider location/radiation/quality/duration/timing/severity/associated sxs/prior Treatment) Patient is a 80 y.o. male presenting with altered mental status.  Altered Mental Status Presenting symptoms: confusion, disorientation, lethargy and partial responsiveness   Severity:  Moderate Most recent episode:  Yesterday Episode history:  Single Timing:  Constant Progression:  Unchanged Chronicity:  Recurrent Context: dementia   Context comment:  Discharge from the hospital yesterday after being hospitalized for hemoptysis. Associated symptoms: no abdominal pain and no fever     Past Medical History  Diagnosis Date  . Hypertension   . Ischemic heart disease   . Hyperlipemia   . Depression   . Testicular cancer   . Polio     child polio  . Rheumatic fever   . Mild dementia   . Mild dementia   . Systolic heart failure     acute on chronic  . CAD (coronary artery disease)   . Chronic atrial fibrillation   . Vitamin D deficiency   . Type II or unspecified type diabetes mellitus without mention of complication, not stated as uncontrolled   . Other testicular hypofunction   . Permanent atrial fibrillation 01/21/2014  . ICD, biventricular, in situ - Medtronic Protecta 2013, leads 2009 01/21/2014    New Generator Medtronic [place] model number [id number], serial number [id number]  Right atrial lead (chronic) Medtronic, model number [id number], serial [id number] (implanted 05/01/2008)  Right ventricular lead (chronic) Medtronic, model number [id number], serial number [id number] (implanted 05/01/2008)  Left ventricular lead Medtronic, model number [id number], serial number [id number] (implanted 05/01/2008)  Exp   Past Surgical History  Procedure Laterality Date  . Coronary artery bypass graft  06/26/2002    LIMA  to diagonal artery & SVG to the first obtuse margin  . Nm myocar perf wall motion  09/15/2006    Dilated LV w/small area of possible nontransmural inferior scar w/mild perinfarct ischemia.  No significant change from previous study.  . Cardiac defibrillator placement  05/01/2008    Medtronic   No family history on file. History  Substance Use Topics  . Smoking status: Former Smoker    Quit date: 12/24/1968  . Smokeless tobacco: Never Used  . Alcohol Use: No    Review of Systems  Unable to perform ROS: Mental status change  Constitutional: Negative for fever.  Gastrointestinal: Negative for abdominal pain.  Psychiatric/Behavioral: Positive for confusion.      Allergies  Lipitor  Home Medications   Current Outpatient Rx  Name  Route  Sig  Dispense  Refill  . ALPRAZolam (XANAX) 1 MG tablet   Oral   Take 0.5 mg by mouth at bedtime. Take every night per wife         . Cholecalciferol (VITAMIN D PO)   Oral   Take 4,000 Int'l Units by mouth daily.         . citalopram (CELEXA) 40 MG tablet   Oral   Take 20 mg by mouth. Takes  1/2 of a 40 mg pill to equal 20 mg         . Cyanocobalamin (VITAMIN B-12 PO)   Oral   Take 1 tablet by mouth daily.         Marland Kitchen diltiazem (CARDIZEM) 120 MG tablet   Oral   Take 1 tablet (  120 mg total) by mouth 3 (three) times daily.   270 tablet   3   . diphenoxylate-atropine (LOMOTIL) 2.5-0.025 MG per tablet      TAKE 1 TABLET BY MOUTH 4 TIMES A DAY AS NEEDED FOR DIARRHEA   100 tablet   1   . donepezil (ARICEPT) 10 MG tablet   Oral   Take 1 tablet (10 mg total) by mouth daily.   30 tablet   6   . dorzolamide-timolol (COSOPT) 22.3-6.8 MG/ML ophthalmic solution   Both Eyes   Place 1 drop into both eyes 2 (two) times daily.          . ferrous fumarate (HEMOCYTE - 106 MG FE) 325 (106 FE) MG TABS   Oral   Take 1 tablet by mouth 2 (two) times daily.         . furosemide (LASIX) 40 MG tablet   Oral   Take 1 tablet (40 mg  total) by mouth daily.   30 tablet   11   . insulin glargine (LANTUS) 100 UNIT/ML injection   Subcutaneous   Inject 0.15 mLs (15 Units total) into the skin daily after breakfast. This was changed since CBGs better controlled during Hospital stay, may titrate up as needed after FU with PCP in 1 week if CBGs trending up at home         . isosorbide mononitrate (IMDUR) 60 MG 24 hr tablet   Oral   Take 90 mg by mouth daily. Pt takes 1and 1/2 of the 60 mg pill to equal 90 mg.         . labetalol (NORMODYNE) 200 MG tablet   Oral   Take 200 mg by mouth 2 (two) times daily.         Marland Kitchen levofloxacin (LEVAQUIN) 500 MG tablet   Oral   Take 1 tablet (500 mg total) by mouth daily. For 5 days   5 tablet   0   . levothyroxine (SYNTHROID, LEVOTHROID) 75 MCG tablet      TAKE 1 TABLET EVERY DAY   90 tablet   3   . losartan (COZAAR) 100 MG tablet   Oral   Take 100 mg by mouth daily.          . meloxicam (MOBIC) 15 MG tablet   Oral   Take 15 mg by mouth as needed for pain.          . Multiple Vitamins-Minerals (ICAPS) CAPS   Oral   Take 1 capsule by mouth daily.         . potassium chloride (KLOR-CON 10) 10 MEQ tablet      TAKE 1 TABLET TWICE DAILY   180 tablet   1   . pravastatin (PRAVACHOL) 40 MG tablet   Oral   Take 40 mg by mouth daily.          BP 215/96  Pulse 78  Temp(Src) 98.3 F (36.8 C) (Oral)  Resp 25  SpO2 97% Physical Exam  Nursing note and vitals reviewed. Constitutional: He appears well-developed and well-nourished. He appears lethargic. No distress.  HENT:  Head: Normocephalic and atraumatic.  Mouth/Throat: Oropharynx is clear and moist.  Eyes: Conjunctivae are normal. Pupils are equal, round, and reactive to light. No scleral icterus.  Neck: Neck supple.  Cardiovascular: Normal rate, regular rhythm, normal heart sounds and intact distal pulses.   No murmur heard. Pulmonary/Chest: Effort normal and breath sounds normal. No stridor. No  respiratory distress. He  has no wheezes. He has no rales.  Intermittently tachypneic  Abdominal: Soft. He exhibits no distension. There is no tenderness.  Musculoskeletal: Normal range of motion. He exhibits no edema.  Neurological: He appears lethargic. He is disoriented. No cranial nerve deficit.  Skin: Skin is warm and dry. No rash noted.  Psychiatric: He has a normal mood and affect. His behavior is normal.    ED Course  Procedures (including critical care time) Labs Review Labs Reviewed  CBC - Abnormal; Notable for the following:    Hemoglobin 12.9 (*)    All other components within normal limits  CBG MONITORING, ED - Abnormal; Notable for the following:    Glucose-Capillary 152 (*)    All other components within normal limits  CULTURE, BLOOD (ROUTINE X 2)  CULTURE, BLOOD (ROUTINE X 2)  COMPREHENSIVE METABOLIC PANEL  URINALYSIS, ROUTINE W REFLEX MICROSCOPIC  LACTIC ACID, PLASMA  BLOOD GAS, ARTERIAL  TROPONIN I   Imaging Review Ct Head Wo Contrast  03/22/2014   CLINICAL DATA:  Altered mental status.  Weakness in dementia.  EXAM: CT HEAD WITHOUT CONTRAST  TECHNIQUE: Contiguous axial images were obtained from the base of the skull through the vertex without intravenous contrast.  COMPARISON:  01/04/2014  FINDINGS: The brain shows generalized atrophy. There is old infarction in the left middle cerebral artery territory affecting the temporal lobe, with ex vacuo enlargement of the temporal horn of the left lateral ventricle. There are chronic small vessel changes throughout the hemispheric white matter. Chronic small-vessel changes affect the pons. There are old small vessel cerebellar infarctions. No sign of acute infarction, mass lesion, hemorrhage, hydrocephalus or extra-axial collection. No calvarial abnormality. Sinuses, middle ears and mastoids are clear.  IMPRESSION: No acute finding. 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  Extensive chronic small vessel disease.   Electronically Signed   By: Nelson Chimes M.D.   On: 03/22/2014 11:31   Dg Chest Port 1 View  03/22/2014   CLINICAL DATA:  AMS  EXAM: PORTABLE CHEST - 1 VIEW  COMPARISON:  DG CHEST 2 VIEW dated 03/19/2014  FINDINGS: Stable cardiomegaly. Stable small bilateral effusions. There is decrease prominence of the pulmonary edema. The right perihilar opacity is also decreased in conspicuity. No new focal region consolidation. Left chest wall dual chamber AICD appreciated. No acute osseous abnormalities.  IMPRESSION: Improving pulmonary edema. No new focal regions of consolidation. Persistent small bilateral effusions.   Electronically Signed   By: Margaree Mackintosh M.D.   On: 03/22/2014 11:46  All radiology studies independently viewed by me.      EKG Interpretation   Date/Time:  Friday March 22 2014 10:22:49 EDT Ventricular Rate:  75 PR Interval:  179 QRS Duration: 192 QT Interval:  507 QTC Calculation: 566 R Axis:   -152 Text Interpretation:  Ventricular-paced rhythm No further analysis  attempted due to paced rhythm Baseline wander in lead(s) V3 Confirmed by  [facility]  MD, TREY ([id number]) on 03/22/2014 11:30:29 AM      MDM   Final diagnoses:  Altered mental status    80 year old male who was just discharged from the hospital yesterday he returns with altered male status. Somnolent and confused. No fevers, lab work unremarkable. Reportedly hypoxic during EMS transport, on 2 L nasal cannula in ED. Plan admission to internal medicine.    Houston Siren III, MD 03/22/14 [phone]

## 2014-03-22 NOTE — ED Notes (Signed)
Patient able to use urinal. 150 cc out. Resting comfortably with family at bedside now. No complaints noted.

## 2014-03-22 NOTE — ED Notes (Signed)
Bed: WA20 Expected date:  Expected time:  Means of arrival:  Comments: EMS- AMS

## 2014-03-22 NOTE — ED Notes (Signed)
Pt alert to self only. Pt not aware of year or month and was not sure of where he is. Pt O2 dropped to 88% on RA. Pt placed on 2L, O2 now 95%.

## 2014-03-22 NOTE — ED Notes (Signed)
Pt from hotel via EMS- per EMS pt has lung mass not being treated. Pt also has DM. Pt has become more increasingly altered in the past few days. Family believes that pt mat have a UTI. Pt oriented to self only. Pt CBG 152 on scene.  176/92 87-irregular  16RR lungs clear 92% RA- Pt on O2 PRN as needed.

## 2014-03-22 NOTE — ED Notes (Signed)
Resp notified to obtain pt ABG

## 2014-03-22 NOTE — ED Notes (Signed)
Pt daughter and wife now at bedside. Daughter states that pt was just released yesterday from this facility yesterday, being tx for possible lung mass or PNA. Pt became more altered since last pm, but pt has hx of dementia. Pt family adds that pt has hx of UTI with the same behavior and wants him screened.

## 2014-03-22 NOTE — ED Notes (Signed)
Attempted to do swallow screen on patient a protocol for AMS. Pt unable to stay awake long enough to complete.

## 2014-03-22 NOTE — ED Notes (Signed)
In to do in and out cath. Patient already urinated. Full linen change. Family aware to call when patient needs to urinate for sample.

## 2014-03-23 LAB — COMPREHENSIVE METABOLIC PANEL
ALT: 22 U/L (ref 0–53)
AST: 17 U/L (ref 0–37)
Albumin: 3.4 g/dL — ABNORMAL LOW (ref 3.5–5.2)
Alkaline Phosphatase: 188 U/L — ABNORMAL HIGH (ref 39–117)
BUN: 11 mg/dL (ref 6–23)
CALCIUM: 9.3 mg/dL (ref 8.4–10.5)
CO2: 27 meq/L (ref 19–32)
CREATININE: 1.06 mg/dL (ref 0.50–1.35)
Chloride: 101 mEq/L (ref 96–112)
GFR calc Af Amer: 74 mL/min — ABNORMAL LOW (ref >90)
GFR, EST NON AFRICAN AMERICAN: 64 mL/min — AB (ref >90)
GLUCOSE: 146 mg/dL — AB (ref 70–99)
Potassium: 3.3 mEq/L — ABNORMAL LOW (ref 3.7–5.3)
Sodium: 140 mEq/L (ref 137–147)
Total Bilirubin: 1 mg/dL (ref 0.3–1.2)
Total Protein: 6.6 g/dL (ref 6.0–8.3)

## 2014-03-23 LAB — CBC
HCT: 41.5 % (ref 39.0–52.0)
HEMOGLOBIN: 13.9 g/dL (ref 13.0–17.0)
MCH: 29.8 pg (ref 26.0–34.0)
MCHC: 33.5 g/dL (ref 30.0–36.0)
MCV: 88.9 fL (ref 78.0–100.0)
PLATELETS: 161 10*3/uL (ref 150–400)
RBC: 4.67 MIL/uL (ref 4.22–5.81)
RDW: 14.7 % (ref 11.5–15.5)
WBC: 8.6 10*3/uL (ref 4.0–10.5)

## 2014-03-23 LAB — GLUCOSE, CAPILLARY
GLUCOSE-CAPILLARY: 160 mg/dL — AB (ref 70–99)
GLUCOSE-CAPILLARY: 198 mg/dL — AB (ref 70–99)
GLUCOSE-CAPILLARY: 188 mg/dL — AB (ref 70–99)
Glucose-Capillary: 142 mg/dL — ABNORMAL HIGH (ref 70–99)

## 2014-03-23 LAB — TROPONIN I

## 2014-03-23 MED ORDER — HYDRALAZINE HCL 25 MG PO TABS
25.0000 mg | ORAL_TABLET | Freq: Four times a day (QID) | ORAL | Status: DC
  Administered 2014-03-23 – 2014-03-24 (×4): 25 mg via ORAL
  Filled 2014-03-23 (×9): qty 1

## 2014-03-23 MED ORDER — HYDRALAZINE HCL 20 MG/ML IJ SOLN
10.0000 mg | Freq: Four times a day (QID) | INTRAMUSCULAR | Status: DC | PRN
  Administered 2014-03-23: 10 mg via INTRAVENOUS
  Filled 2014-03-23: qty 0.5
  Filled 2014-03-23: qty 1

## 2014-03-23 MED ORDER — POTASSIUM CHLORIDE CRYS ER 20 MEQ PO TBCR
40.0000 meq | EXTENDED_RELEASE_TABLET | Freq: Two times a day (BID) | ORAL | Status: AC
  Administered 2014-03-23 (×2): 40 meq via ORAL
  Filled 2014-03-23 (×2): qty 2

## 2014-03-23 MED ORDER — FUROSEMIDE 40 MG PO TABS
60.0000 mg | ORAL_TABLET | Freq: Every day | ORAL | Status: DC
  Filled 2014-03-23 (×2): qty 1

## 2014-03-23 NOTE — Progress Notes (Signed)
Patient transferring to room 1439.  Report called to [place], Therapist, sports.  Patient to travel by wheelchair.  Will continue to monitor.

## 2014-03-23 NOTE — Progress Notes (Signed)
Recd pt from ICU, no distress, sitting up in bed requesting to eat.  NPO until SLP, will page to check on status.  No change from am assessment

## 2014-03-23 NOTE — Progress Notes (Signed)
    Subjective:  Denies CP or dyspnea   Objective:  Filed Vitals:   03/23/14 0400 03/23/14 0600 03/23/14 0700 03/23/14 0800  BP: 180/70 196/82 183/60 178/127  Pulse: 69 61 67 68  Temp: 97.5 F (36.4 C)   98.4 F (36.9 C)  TempSrc: Oral   Oral  Resp: 12 22 20 24   Height:      Weight:      SpO2: 99% 97% 98% 93%    Intake/Output from previous day:  Intake/Output Summary (Last 24 hours) at 03/23/14 0850 Last data filed at 03/23/14 0500  Gross per 24 hour  Intake      0 ml  Output   1550 ml  Net  -1550 ml    Physical Exam: Physical exam: Well-developed well-nourished in no acute distress.  Skin is warm and dry.  HEENT is normal.  Neck is supple.  Chest is clear to auscultation with normal expansion.  Cardiovascular exam is irregular Abdominal exam nontender or distended. No masses palpated. Extremities show no edema. neuro grossly intact    Lab Results: Basic Metabolic Panel:  Recent Labs  03/22/14 1020 03/23/14 0400  NA 141 140  K 4.0 3.3*  CL 105 101  CO2 24 27  GLUCOSE 168* 146*  BUN 10 11  CREATININE 0.96 1.06  CALCIUM 9.0 9.3   CBC:  Recent Labs  03/22/14 1020 03/23/14 0400  WBC 7.8 8.6  HGB 12.9* 13.9  HCT 39.1 41.5  MCV 89.3 88.9  PLT 156 161   Cardiac Enzymes:  Recent Labs  03/22/14 1645 03/22/14 2150 03/23/14 0400  TROPONINI <0.30 <0.30 <0.30     Assessment/Plan:  1 cardiomyopathy-patient's LV function is severely reduced. This may be related to elevated blood pressure. Continue ARB. Continue labetalol. Add hydralazine and titrate as needed. Continue nitrates. 2 acute on chronic systolic congestive heart failure- patient does not appear to be markedly volume overloaded. We'll change Lasix to 60 mg daily and follow renal function. 3 atrial fibrillation-continue Cardizem and beta blocker. He has not been felt to be a Coumadin candidate given history of intracranial hemorrhage. Continue aspirin. 4 previous biventricular ICD 5  hypertension-blood pressure elevated. Add hydralazine 25 mg by mouth every 6 hours and increase as needed. 6 hypokalemia-Supplement 7 coronary artery disease-continue aspirin.  Lelon Perla 03/23/2014, 8:50 AM

## 2014-03-23 NOTE — Progress Notes (Signed)
PROGRESS NOTE  Troy Warren:[id number] DOB: [birth year] DOA: 03/22/2014 PCP: Alesia Richards, MD  Assessment/Plan: Acute encephalopathy  - seems improved this morning, patient waking up and conversant. No lethargy noted. Ammonia normal, troponins normal x 3. Likely fluctuating mental status in the setting of demential   Chronic Systolic heart failure - patient had a 2-D echo last hospitalization which resulted after discharge, showing an EF of 25-30%, which is a new decrease in ejection fraction compared to his previous most recent known EF as per cardiology. - cardiology consulted, appreciate input.   Hypertension  - currently on Diltiazem 120 TID, Lasix 40 BID, Imdur 90 daily, Labetalol 300 BID, Losartan 100 daily. Got all of them yesterday except Losartan, monitor. Still hypertensive this morning, IV PRNs available.   Type 2 diabetes - she was noted to have well controlled sugars during his previous hospitalization without the need for Lantus, continue sliding scale for now.   Atrial fibrillation - continue Cardizem. He is on aspirin.   Liver cirrhosis - appears compensated  Status post bIV-ICD   CAD - continue home medications   Vascular dementia - continue home medications   Hypothyroidism - TSH was normal 2 days ago. Continue Synthroid.   Possible pneumonia during last hospitalization - continue levofloxacin   Hyperlipidemia   Diet: NPO pending SLP, advance to heart healthy Fluids: none DVT Prophylaxis: Lovenox  Code Status: Full Family Communication: none this morning  Disposition Plan: inpatient, transfer to telemetry   Consultants:  Cardiology   Procedures:  none   Antibiotics Levofloxacin 4/6 >>  HPI/Subjective: - feeling well this morning, denies chest pain or breathing difficulties. Alert and appropriate but with underlying confusion.   Objective: Filed Vitals:   03/23/14 0200 03/23/14 0300 03/23/14 0400 03/23/14 0600  BP: 179/64  167/69 180/70 196/82  Pulse: 61 59 69 61  Temp:      TempSrc:      Resp: 12 22 12 22   Height:      Weight:      SpO2: 96% 94% 99% 97%    Intake/Output Summary (Last 24 hours) at 03/23/14 0651 Last data filed at 03/23/14 0500  Gross per 24 hour  Intake      0 ml  Output   1550 ml  Net  -1550 ml   Filed Weights   03/22/14 1640  Weight: 73.6 kg (162 lb 4.1 oz)    Exam:  General:  NAD  Cardiovascular: regular rate and rhythm, without MRG  Respiratory: good air movement, clear to auscultation throughout, no wheezing, ronchi or rales  Abdomen: soft, not tender to palpation, positive bowel sounds  MSK: no peripheral edema  Neuro: non focal  Data Reviewed: Basic Metabolic Panel:  Recent Labs Lab 03/18/14 1545 03/19/14 0530 03/20/14 0357 03/22/14 1020 03/23/14 0400  NA 145 144 139 141 140  K 3.8 3.4* 3.4* 4.0 3.3*  CL 107 102 102 105 101  CO2 27 30 26 24 27   GLUCOSE 99 65* 115* 168* 146*  BUN 16 14 13 10 11   CREATININE 1.15 1.02 1.13 0.96 1.06  CALCIUM 8.9 9.3 8.4 9.0 9.3   Liver Function Tests:  Recent Labs Lab 03/19/14 0530 03/20/14 0357 03/22/14 1020 03/23/14 0400  AST 261* 71* 19 17  ALT 85* 59* 26 22  ALKPHOS 269* 218* 186* 188*  BILITOT 3.4* 2.1* 0.9 1.0  PROT 6.9 5.5* 6.3 6.6  ALBUMIN 3.6 2.9* 3.3* 3.4*    Recent Labs Lab 03/22/14 1645  AMMONIA  16   CBC:  Recent Labs Lab 03/18/14 1545 03/19/14 0530 03/20/14 0357 03/22/14 1020 03/23/14 0400  WBC 7.9 8.1 7.4 7.8 8.6  HGB 13.2 13.9 11.8* 12.9* 13.9  HCT 39.7 42.0 35.4* 39.1 41.5  MCV 90.6 90.3 88.9 89.3 88.9  PLT 151 153 146* 156 161   Cardiac Enzymes:  Recent Labs Lab 03/22/14 1030 03/22/14 1645 03/22/14 2150 03/23/14 0400  TROPONINI <0.30 <0.30 <0.30 <0.30   BNP (last 3 results)  Recent Labs  03/18/14 2050  PROBNP 4169.0*   CBG:  Recent Labs Lab 03/20/14 2106 03/21/14 0729 03/22/14 1032 03/22/14 1803 03/22/14 2020  GLUCAP 212* 171* 152* 120* 138*     Recent Results (from the past 240 hour(s))  MRSA PCR SCREENING     Status: None   Collection Time    03/18/14  9:04 PM      Result Value Ref Range Status   MRSA by PCR NEGATIVE  NEGATIVE Final   Comment:            The GeneXpert MRSA Assay (FDA     approved for NASAL specimens     only), is one component of a     comprehensive MRSA colonization     surveillance program. It is not     intended to diagnose MRSA     infection nor to guide or     monitor treatment for     MRSA infections.  CULTURE, BLOOD (ROUTINE X 2)     Status: None   Collection Time    03/19/14  1:00 PM      Result Value Ref Range Status   Specimen Description BLOOD RIGHT ARM   Final   Special Requests BOTTLES DRAWN AEROBIC AND ANAEROBIC 5CC   Final   Culture  Setup Time     Final   Value: 03/19/2014 16:10     Performed at [facility]   Culture     Final   Value:        BLOOD CULTURE RECEIVED NO GROWTH TO DATE CULTURE WILL BE HELD FOR 5 DAYS BEFORE ISSUING A FINAL NEGATIVE REPORT     Performed at [facility]   Report Status PENDING   Incomplete  CULTURE, BLOOD (ROUTINE X 2)     Status: None   Collection Time    03/19/14  1:05 PM      Result Value Ref Range Status   Specimen Description BLOOD LEFT ARM   Final   Special Requests BOTTLES DRAWN AEROBIC AND ANAEROBIC [facility]   Final   Culture  Setup Time     Final   Value: 03/19/2014 16:10     Performed at [facility]   Culture     Final   Value:        BLOOD CULTURE RECEIVED NO GROWTH TO DATE CULTURE WILL BE HELD FOR 5 DAYS BEFORE ISSUING A FINAL NEGATIVE REPORT     Performed at [facility]   Report Status PENDING   Incomplete  MRSA PCR SCREENING     Status: None   Collection Time    03/22/14  5:02 PM      Result Value Ref Range Status   MRSA by PCR NEGATIVE  NEGATIVE Final   Comment:            The GeneXpert MRSA Assay (FDA     approved for NASAL specimens     only), is one component of a  comprehensive MRSA  colonization     surveillance program. It is not     intended to diagnose MRSA     infection nor to guide or     monitor treatment for     MRSA infections.     Studies: Ct Head Wo Contrast  03/22/2014   CLINICAL DATA:  Altered mental status.  Weakness in dementia.  EXAM: CT HEAD WITHOUT CONTRAST  TECHNIQUE: Contiguous axial images were obtained from the base of the skull through the vertex without intravenous contrast.  COMPARISON:  01/04/2014  FINDINGS: The brain shows generalized atrophy. There is old infarction in the left middle cerebral artery territory affecting the temporal lobe, with ex vacuo enlargement of the temporal horn of the left lateral ventricle. There are chronic small vessel changes throughout the hemispheric white matter. Chronic small-vessel changes affect the pons. There are old small vessel cerebellar infarctions. No sign of acute infarction, mass lesion, hemorrhage, hydrocephalus or extra-axial collection. No calvarial abnormality. Sinuses, middle ears and mastoids are clear.  IMPRESSION: No acute finding. 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 Extensive chronic small vessel disease.   Electronically Signed   By: Nelson Chimes M.D.   On: 03/22/2014 11:31   Dg Chest Port 1 View  03/22/2014   CLINICAL DATA:  AMS  EXAM: PORTABLE CHEST - 1 VIEW  COMPARISON:  DG CHEST 2 VIEW dated 03/19/2014  FINDINGS: Stable cardiomegaly. Stable small bilateral effusions. There is decrease prominence of the pulmonary edema. The right perihilar opacity is also decreased in conspicuity. No new focal region consolidation. Left chest wall dual chamber AICD appreciated. No acute osseous abnormalities.  IMPRESSION: Improving pulmonary edema. No new focal regions of consolidation. Persistent small bilateral effusions.   Electronically Signed   By: Margaree Mackintosh M.D.   On: 03/22/2014 11:46    Scheduled Meds: . aspirin  81 mg Oral Daily  . citalopram  20  mg Oral Daily  . diltiazem  120 mg Oral TID  . donepezil  10 mg Oral Daily  . dorzolamide-timolol  1 drop Both Eyes BID  . enoxaparin (LOVENOX) injection  40 mg Subcutaneous Q24H  . furosemide  40 mg Intravenous BID  . insulin aspart  0-9 Units Subcutaneous TID WC  . isosorbide mononitrate  90 mg Oral Daily  . labetalol  300 mg Oral BID  . levofloxacin  500 mg Oral Daily  . levothyroxine  75 mcg Oral QAC breakfast  . losartan  100 mg Oral Daily  . sodium chloride  3 mL Intravenous Q12H   Continuous Infusions:   Principal Problem:   Acute encephalopathy Active Problems:   Dementia, vascular   Hypertension   Ischemic heart disease   Hyperlipemia   CAD (coronary artery disease)   Type II or unspecified type diabetes mellitus without mention of complication, not stated as uncontrolled   Permanent atrial fibrillation   ICD, biventricular, in situ - Medtronic Protecta 2013, leads 2009   Altered mental status   Acute on chronic systolic heart failure   Hypothyroidism   Time spent: 35  This note has been created with Surveyor, quantity. Any transcriptional errors are unintentional.   Marzetta Board, MD [facility] Pager [phone]. If 7 PM - 7 AM, please contact night-coverage at [email or web address], password [facility] 03/23/2014, 6:51 AM  LOS: 1 day

## 2014-03-23 NOTE — Evaluation (Signed)
Clinical/Bedside Swallow Evaluation Patient Details  Name: Troy Warren MRN: [id number] Date of Birth: Nov 17, 1931  Today's Date: 03/23/2014 Time: 0630-1601 SLP Time Calculation (min): 16 min  Past Medical History:  Past Medical History  Diagnosis Date  . Hypertension   . Ischemic heart disease   . Hyperlipemia   . Depression   . Testicular cancer   . Polio     child polio  . Rheumatic fever   . Mild dementia   . Mild dementia   . Systolic heart failure     acute on chronic  . CAD (coronary artery disease)   . Chronic atrial fibrillation   . Vitamin D deficiency   . Type II or unspecified type diabetes mellitus without mention of complication, not stated as uncontrolled   . Other testicular hypofunction   . Permanent atrial fibrillation 01/21/2014  . ICD, biventricular, in situ - Medtronic Protecta 2013, leads 2009 01/21/2014    New Generator Medtronic [place] model number [id number], serial number [id number]  Right atrial lead (chronic) Medtronic, model number [id number], serial [id number] (implanted 05/01/2008)  Right ventricular lead (chronic) Medtronic, model number [id number], serial number [id number] (implanted 05/01/2008)  Left ventricular lead Medtronic, model number [id number], serial number [id number] (implanted 05/01/2008)  Exp   Past Surgical History:  Past Surgical History  Procedure Laterality Date  . Coronary artery bypass graft  06/26/2002    LIMA to diagonal artery & SVG to the first obtuse margin  . Nm myocar perf wall motion  09/15/2006    Dilated LV w/small area of possible nontransmural inferior scar w/mild perinfarct ischemia.  No significant change from previous study.  . Cardiac defibrillator placement  05/01/2008    Medtronic   HPI:  80 y.o. male with a history of CAD, ICM, HTN and vascular dementia. He was admitted 4/4-->4/9 w/ hemoptysis and came back to the ER 04/10 w/ confusion.  Dx acute encephalopathy, MS appears to be improved today.  Spouse thinks he is back to  baseline.     Assessment / Plan / Recommendation Clinical Impression  Pt presents with normal oropharyngeal swallow with active mastication, swift swallow trigger, and no overt s/s of aspiration.  Recommend continuing current regular diet, thin liquids.  SLP to sign off.     Aspiration Risk  Mild    Diet Recommendation Regular;Thin liquid   Liquid Administration via: Cup;Straw Medication Administration: Whole meds with liquid Supervision: Patient able to self feed    Other  Recommendations Oral Care Recommendations: Oral care BID   Follow Up Recommendations  None      Swallow Study Prior Functional Status       General Date of Onset: 03/22/14 .   Type of Study: Bedside swallow evaluation Previous Swallow Assessment: none per records Diet Prior to this Study: Regular;Thin liquids Temperature Spikes Noted: No Respiratory Status: Room air History of Recent Intubation: No Behavior/Cognition: Alert;Confused Oral Cavity - Dentition: Missing dentition Self-Feeding Abilities: Able to feed self Patient Positioning: Upright in bed Baseline Vocal Quality: Clear Volitional Cough: Strong Volitional Swallow: Able to elicit    Oral/Motor/Sensory Function Overall Oral Motor/Sensory Function: Appears within functional limits for tasks assessed   Ice Chips Ice chips: Within functional limits Presentation: Spoon   Thin Liquid Thin Liquid: Within functional limits Presentation: Cup;Self Fed    Nectar Thick Nectar Thick Liquid: Not tested   Honey Thick Honey Thick Liquid: Not tested   Puree Puree: Within functional limits Presentation: [place]. Con Arganbright,  MA CCC/SLP Pager [phone]     Solid: Within functional limits Presentation: Self Fed       Estill Bamberg Laurice Roniesha Hollingshead 03/23/2014,2:23 PM

## 2014-03-24 LAB — BASIC METABOLIC PANEL
BUN: 34 mg/dL — AB (ref 6–23)
CALCIUM: 9.3 mg/dL (ref 8.4–10.5)
CO2: 22 mEq/L (ref 19–32)
Chloride: 100 mEq/L (ref 96–112)
Creatinine, Ser: 2.17 mg/dL — ABNORMAL HIGH (ref 0.50–1.35)
GFR calc Af Amer: 31 mL/min — ABNORMAL LOW (ref >90)
GFR calc non Af Amer: 27 mL/min — ABNORMAL LOW (ref >90)
Glucose, Bld: 186 mg/dL — ABNORMAL HIGH (ref 70–99)
Potassium: 4.5 mEq/L (ref 3.7–5.3)
Sodium: 137 mEq/L (ref 137–147)

## 2014-03-24 LAB — GLUCOSE, CAPILLARY
GLUCOSE-CAPILLARY: 220 mg/dL — AB (ref 70–99)
GLUCOSE-CAPILLARY: 190 mg/dL — AB (ref 70–99)
Glucose-Capillary: 172 mg/dL — ABNORMAL HIGH (ref 70–99)

## 2014-03-24 LAB — CBC
HCT: 42.7 % (ref 39.0–52.0)
Hemoglobin: 14.1 g/dL (ref 13.0–17.0)
MCH: 29.7 pg (ref 26.0–34.0)
MCHC: 33 g/dL (ref 30.0–36.0)
MCV: 89.9 fL (ref 78.0–100.0)
PLATELETS: 194 10*3/uL (ref 150–400)
RBC: 4.75 MIL/uL (ref 4.22–5.81)
RDW: 15.2 % (ref 11.5–15.5)
WBC: 12 10*3/uL — AB (ref 4.0–10.5)

## 2014-03-24 LAB — PROTIME-INR
INR: 1.13 (ref 0.00–1.49)
Prothrombin Time: 14.3 seconds (ref 11.6–15.2)

## 2014-03-24 MED ORDER — TRAMADOL HCL 50 MG PO TABS
50.0000 mg | ORAL_TABLET | Freq: Once | ORAL | Status: AC
  Administered 2014-03-24: 50 mg via ORAL
  Filled 2014-03-24: qty 1

## 2014-03-24 MED ORDER — FUROSEMIDE 40 MG PO TABS
40.0000 mg | ORAL_TABLET | Freq: Every day | ORAL | Status: DC
  Filled 2014-03-24: qty 1

## 2014-03-24 MED ORDER — LEVOFLOXACIN 750 MG PO TABS
750.0000 mg | ORAL_TABLET | ORAL | Status: DC
  Administered 2014-03-26: 750 mg via ORAL
  Filled 2014-03-24: qty 1

## 2014-03-24 MED ORDER — ENOXAPARIN SODIUM 30 MG/0.3ML ~~LOC~~ SOLN
30.0000 mg | SUBCUTANEOUS | Status: DC
  Administered 2014-03-24 – 2014-03-25 (×2): 30 mg via SUBCUTANEOUS
  Filled 2014-03-24 (×3): qty 0.3

## 2014-03-24 NOTE — Progress Notes (Signed)
PROGRESS NOTE  Troy Warren [id number] DOB: Feb 02, 1931 DOA: 03/22/2014 PCP: Alesia Richards, MD  Assessment/Plan: Acute encephalopathy  - seems improved this morning, patient waking up and conversant. No lethargy noted. Ammonia normal, troponins normal x 3. Likely fluctuating mental status in the setting of dementia  Chronic Systolic heart failure - patient had a 2-D echo last hospitalization which resulted after discharge, showing an EF of 25-30%, which is a new decrease in ejection fraction compared to his previous most recent known EF as per cardiology. - cardiology consulted, appreciate input.  - hold diuresis and ARB today given worsening renal function.   AKI - in the setting of diuresis, will hold Lasix and ARB today, repeat BMP in am  Hypertension  - currently on Diltiazem 120 TID, Lasix 40 BID, Imdur 90 daily, Labetalol 300 BID, Losartan 100 daily. Got all of them yesterday except Losartan, monitor. Still hypertensive this morning, IV PRNs available.   Type 2 diabetes - she was noted to have well controlled sugars during his previous hospitalization without the need for Lantus, continue sliding scale for now.   Atrial fibrillation - continue Cardizem. He is on aspirin.   Liver cirrhosis - appears compensated  Status post bIV-ICD   CAD - continue home medications   Vascular dementia - continue home medications   Hypothyroidism - TSH was normal 2 days ago. Continue Synthroid.   Possible pneumonia during last hospitalization - continue levofloxacin   Hyperlipidemia   Diet: NPO pending SLP, advance to heart healthy Fluids: none DVT Prophylaxis: Lovenox  Code Status: Full Family Communication: none this morning  Disposition Plan: inpatient, transfer to telemetry   Consultants:  Cardiology   Procedures:  none   Antibiotics Levofloxacin 4/6 >>  HPI/Subjective: - feeling well this morning, denies chest pain or breathing difficulties. Alert  and appropriate but with underlying confusion. Asking to go home.   Objective: Filed Vitals:   03/23/14 2025 03/23/14 2100 03/24/14 0624 03/24/14 1401  BP: 116/61  129/61 97/48  Pulse: 81  61 64  Temp: 98 F (36.7 C)  98.2 F (36.8 C) 97.6 F (36.4 C)  TempSrc: Oral  Oral Oral  Resp: 17  16 18   Height:      Weight:   73.8 kg (162 lb 11.2 oz)   SpO2: 90% 96% 97% 97%    Intake/Output Summary (Last 24 hours) at 03/24/14 1519 Last data filed at 03/24/14 1300  Gross per 24 hour  Intake   1080 ml  Output    500 ml  Net    580 ml   Filed Weights   03/22/14 1640 03/24/14 0624  Weight: 73.6 kg (162 lb 4.1 oz) 73.8 kg (162 lb 11.2 oz)   Exam:  General:  NAD  Cardiovascular: regular rate and rhythm, without MRG  Respiratory: good air movement, clear to auscultation throughout, no wheezing, ronchi or rales  Abdomen: soft, not tender to palpation, positive bowel sounds  MSK: no peripheral edema  Neuro: non focal  Data Reviewed: Basic Metabolic Panel:  Recent Labs Lab 03/19/14 0530 03/20/14 0357 03/22/14 1020 03/23/14 0400 03/24/14 0608  NA 144 139 141 140 137  K 3.4* 3.4* 4.0 3.3* 4.5  CL 102 102 105 101 100  CO2 30 26 24 27 22   GLUCOSE 65* 115* 168* 146* 186*  BUN 14 13 10 11  34*  CREATININE 1.02 1.13 0.96 1.06 2.17*  CALCIUM 9.3 8.4 9.0 9.3 9.3   Liver Function Tests:  Recent Labs Lab 03/19/14  0530 03/20/14 0357 03/22/14 1020 03/23/14 0400  AST 261* 71* 19 17  ALT 85* 59* 26 22  ALKPHOS 269* 218* 186* 188*  BILITOT 3.4* 2.1* 0.9 1.0  PROT 6.9 5.5* 6.3 6.6  ALBUMIN 3.6 2.9* 3.3* 3.4*    Recent Labs Lab 03/22/14 1645  AMMONIA 16   CBC:  Recent Labs Lab 03/19/14 0530 03/20/14 0357 03/22/14 1020 03/23/14 0400 03/24/14 0608  WBC 8.1 7.4 7.8 8.6 12.0*  HGB 13.9 11.8* 12.9* 13.9 14.1  HCT 42.0 35.4* 39.1 41.5 42.7  MCV 90.3 88.9 89.3 88.9 89.9  PLT 153 146* 156 161 194   Cardiac Enzymes:  Recent Labs Lab 03/22/14 1030 03/22/14 1645  03/22/14 2150 03/23/14 0400  TROPONINI <0.30 <0.30 <0.30 <0.30   BNP (last 3 results)  Recent Labs  03/18/14 2050  PROBNP 4169.0*   CBG:  Recent Labs Lab 03/23/14 1257 03/23/14 1646 03/23/14 2122 03/24/14 0718 03/24/14 1216  GLUCAP 160* 198* 188* 172* 220*    Recent Results (from the past 240 hour(s))  MRSA PCR SCREENING     Status: None   Collection Time    03/18/14  9:04 PM      Result Value Ref Range Status   MRSA by PCR NEGATIVE  NEGATIVE Final   Comment:            The GeneXpert MRSA Assay (FDA     approved for NASAL specimens     only), is one component of a     comprehensive MRSA colonization     surveillance program. It is not     intended to diagnose MRSA     infection nor to guide or     monitor treatment for     MRSA infections.  CULTURE, BLOOD (ROUTINE X 2)     Status: None   Collection Time    03/19/14  1:00 PM      Result Value Ref Range Status   Specimen Description BLOOD RIGHT ARM   Final   Special Requests BOTTLES DRAWN AEROBIC AND ANAEROBIC 5CC   Final   Culture  Setup Time     Final   Value: 03/19/2014 16:10     Performed at [facility]   Culture     Final   Value:        BLOOD CULTURE RECEIVED NO GROWTH TO DATE CULTURE WILL BE HELD FOR 5 DAYS BEFORE ISSUING A FINAL NEGATIVE REPORT     Performed at [facility]   Report Status PENDING   Incomplete  CULTURE, BLOOD (ROUTINE X 2)     Status: None   Collection Time    03/19/14  1:05 PM      Result Value Ref Range Status   Specimen Description BLOOD LEFT ARM   Final   Special Requests BOTTLES DRAWN AEROBIC AND ANAEROBIC [facility]   Final   Culture  Setup Time     Final   Value: 03/19/2014 16:10     Performed at [facility]   Culture     Final   Value:        BLOOD CULTURE RECEIVED NO GROWTH TO DATE CULTURE WILL BE HELD FOR 5 DAYS BEFORE ISSUING A FINAL NEGATIVE REPORT     Performed at [facility]   Report Status PENDING   Incomplete  CULTURE, BLOOD (ROUTINE X  2)     Status: None   Collection Time    03/22/14 11:17 AM  Result Value Ref Range Status   Specimen Description BLOOD LEFT ANTECUBITAL   Final   Special Requests     Final   Value: BOTTLES DRAWN AEROBIC AND ANAEROBIC 5ML ANA 3ML AER   Culture  Setup Time     Final   Value: 03/22/2014 14:08     Performed at [facility]   Culture     Final   Value:        BLOOD CULTURE RECEIVED NO GROWTH TO DATE CULTURE WILL BE HELD FOR 5 DAYS BEFORE ISSUING A FINAL NEGATIVE REPORT     Performed at [facility]   Report Status PENDING   Incomplete  CULTURE, BLOOD (ROUTINE X 2)     Status: None   Collection Time    03/22/14 11:19 AM      Result Value Ref Range Status   Specimen Description BLOOD RIGHT FOREARM   Final   Special Requests BOTTLES DRAWN AEROBIC AND ANAEROBIC 5ML   Final   Culture  Setup Time     Final   Value: 03/22/2014 14:08     Performed at [facility]   Culture     Final   Value:        BLOOD CULTURE RECEIVED NO GROWTH TO DATE CULTURE WILL BE HELD FOR 5 DAYS BEFORE ISSUING A FINAL NEGATIVE REPORT     Performed at [facility]   Report Status PENDING   Incomplete  MRSA PCR SCREENING     Status: None   Collection Time    03/22/14  5:02 PM      Result Value Ref Range Status   MRSA by PCR NEGATIVE  NEGATIVE Final   Comment:            The GeneXpert MRSA Assay (FDA     approved for NASAL specimens     only), is one component of a     comprehensive MRSA colonization     surveillance program. It is not     intended to diagnose MRSA     infection nor to guide or     monitor treatment for     MRSA infections.     Studies: No results found.  Scheduled Meds: . aspirin  81 mg Oral Daily  . citalopram  20 mg Oral Daily  . diltiazem  120 mg Oral TID  . donepezil  10 mg Oral Daily  . dorzolamide-timolol  1 drop Both Eyes BID  . enoxaparin (LOVENOX) injection  30 mg Subcutaneous Q24H  . insulin aspart  0-9 Units Subcutaneous TID WC  .  isosorbide mononitrate  90 mg Oral Daily  . labetalol  300 mg Oral BID  . [START ON 03/26/2014] levofloxacin  750 mg Oral Q48H  . levothyroxine  75 mcg Oral QAC breakfast  . sodium chloride  3 mL Intravenous Q12H   Continuous Infusions:   Principal Problem:   Acute encephalopathy Active Problems:   Dementia, vascular   Hypertension   Ischemic heart disease   Hyperlipemia   CAD (coronary artery disease)   Type II or unspecified type diabetes mellitus without mention of complication, not stated as uncontrolled   Permanent atrial fibrillation   ICD, biventricular, in situ - Medtronic Protecta 2013, leads 2009   Altered mental status   Acute on chronic systolic heart failure   Hypothyroidism  Time spent: 25  This note has been created with Surveyor, quantity. Any transcriptional errors are  unintentional.   Marzetta Board, MD [facility] Pager [phone]. If 7 PM - 7 AM, please contact night-coverage at [email or web address], password [facility] 03/24/2014, 3:19 PM  LOS: 2 days

## 2014-03-24 NOTE — Evaluation (Signed)
Physical Therapy Evaluation Patient Details Name: Troy Warren MRN: [id number] DOB: [birth year] Today's Date: 03/24/2014   History of Present Illness  Troy Warren is a 80 y.o. male has a past medical history significant for dementia, CAD s/p CABG, ischemic cardiomyopathy-but with improvement in his ejection fraction to 60-65% following CRT per his cardiologist, Dr. Sallyanne Kuster, status post biventricular ICD, atrial fibrillation (on aspirin), and vascular dementia, is being brought to the ED by family due to confusion, lethargy, decreased responsiveness and worsening mental status.   Clinical Impression  Pt agitated, wants to go home.  Pt able to gait with supervision, appears at baseline level of functional status per family.  Pt with no further acute PT needs, will benefit from HHPT for cardiorespiratory endurance training.    Follow Up Recommendations Home health PT;Supervision/Assistance - 24 hour    Equipment Recommendations  None recommended by PT    Recommendations for Other Services       Precautions / Restrictions Precautions Precautions: Fall Restrictions Weight Bearing Restrictions: No      Mobility  Bed Mobility Overal bed mobility: Modified Independent                Transfers Overall transfer level: Modified independent                  Ambulation/Gait Ambulation/Gait assistance: Supervision Ambulation Distance (Feet): 200 Feet Assistive device: None       General Gait Details: close supervision for safety, DOE 2/4, eases with seated rest  Stairs            Wheelchair Mobility    Modified Rankin (Stroke Patients Only)       Balance                                             Pertinent Vitals/Pain No c/o pain    Home Living Family/patient expects to be discharged to:: Private residence Living Arrangements: Spouse/significant other Available Help at Discharge: Family Type of Home: Mobile home Home  Access: Stairs to enter Entrance Stairs-Rails: Psychiatric nurse of Steps: 4 Home Layout: One level        Prior Function Level of Independence: Independent               Hand Dominance        Extremity/Trunk Assessment               Lower Extremity Assessment: Generalized weakness      Cervical / Trunk Assessment: Normal  Communication   Communication: HOH  Cognition Arousal/Alertness: Awake/alert Behavior During Therapy: Restless;Agitated Overall Cognitive Status: History of cognitive impairments - at baseline                      General Comments      Exercises        Assessment/Plan    PT Assessment All further PT needs can be met in the next venue of care  PT Diagnosis Difficulty walking;Generalized weakness   PT Problem List Decreased strength;Decreased activity tolerance;Decreased balance  PT Treatment Interventions     PT Goals (Current goals can be found in the Care Plan section) Acute Rehab PT Goals PT Goal Formulation: No goals set, d/c therapy    Frequency     Barriers to discharge        Co-evaluation  End of Session Equipment Utilized During Treatment: Gait belt Activity Tolerance: Patient tolerated treatment well Patient left: in bed;with nursing/sitter in room;with call bell/phone within reach;with family/visitor present Nurse Communication: Mobility status         Time: 3614-4315 PT Time Calculation (min): 11 min   Charges:   PT Evaluation $Initial PT Evaluation Tier I: 1 Procedure     PT G Codes:          Kennith Gain 03/24/2014, 10:28 AM

## 2014-03-24 NOTE — Progress Notes (Signed)
    Subjective:  Denies CP or dyspnea   Objective:  Filed Vitals:   03/23/14 1447 03/23/14 2025 03/23/14 2100 03/24/14 0624  BP: 103/59 116/61  129/61  Pulse: 69 81  61  Temp: 97.2 F (36.2 C) 98 F (36.7 C)  98.2 F (36.8 C)  TempSrc: Oral Oral  Oral  Resp: 16 17  16   Height:      Weight:    162 lb 11.2 oz (73.8 kg)  SpO2: 96% 90% 96% 97%    Intake/Output from previous day:  Intake/Output Summary (Last 24 hours) at 03/24/14 0705 Last data filed at 03/23/14 2300  Gross per 24 hour  Intake    660 ml  Output   1000 ml  Net   -340 ml    Physical Exam: Physical exam: Well-developed well-nourished in no acute distress.  Skin is warm and dry.  HEENT is normal.  Neck is supple.  Chest is clear to auscultation with normal expansion.  Cardiovascular exam is irregular Abdominal exam nontender or distended. No masses palpated. Extremities show no edema. neuro grossly intact    Lab Results: Basic Metabolic Panel:  Recent Labs  03/22/14 1020 03/23/14 0400  NA 141 140  K 4.0 3.3*  CL 105 101  CO2 24 27  GLUCOSE 168* 146*  BUN 10 11  CREATININE 0.96 1.06  CALCIUM 9.0 9.3   CBC:  Recent Labs  03/22/14 1020 03/23/14 0400  WBC 7.8 8.6  HGB 12.9* 13.9  HCT 39.1 41.5  MCV 89.3 88.9  PLT 156 161   Cardiac Enzymes:  Recent Labs  03/22/14 1645 03/22/14 2150 03/23/14 0400  TROPONINI <0.30 <0.30 <0.30     Assessment/Plan:  1 cardiomyopathy-patient's LV function is severely reduced. This may be related to elevated blood pressure. BP much improved this AM. Will continue labetalol, cozaar and cardizem; DC hydralazine. 2 acute on chronic systolic congestive heart failure- patient does not appear to be markedly volume overloaded. Will change Lasix to 40 mg daily; BMET pending. 3 atrial fibrillation-continue Cardizem and beta blocker. He has not been felt to be a Coumadin candidate given history of intracranial hemorrhage. Continue aspirin. 4 previous  biventricular ICD 5 hypertension-blood pressure much improve and am concerned regimen may be too aggressive; DC hydralazine and follow. 6 hypokalemia-FU BMET pending 7 coronary artery disease-continue aspirin. 8 Abnormal chest CT-needs FU; per IM and pulmonary FU Dr Sallyanne Kuster at [facility] 03/24/2014, 7:05 AM

## 2014-03-25 DIAGNOSIS — J9620 Acute and chronic respiratory failure, unspecified whether with hypoxia or hypercapnia: Secondary | ICD-10-CM

## 2014-03-25 DIAGNOSIS — N182 Chronic kidney disease, stage 2 (mild): Secondary | ICD-10-CM

## 2014-03-25 DIAGNOSIS — N183 Chronic kidney disease, stage 3 unspecified: Secondary | ICD-10-CM | POA: Diagnosis present

## 2014-03-25 DIAGNOSIS — R0902 Hypoxemia: Secondary | ICD-10-CM

## 2014-03-25 LAB — CULTURE, BLOOD (ROUTINE X 2)
CULTURE: NO GROWTH
Culture: NO GROWTH

## 2014-03-25 LAB — BASIC METABOLIC PANEL
BUN: 44 mg/dL — ABNORMAL HIGH (ref 6–23)
CALCIUM: 9.3 mg/dL (ref 8.4–10.5)
CO2: 22 mEq/L (ref 19–32)
CREATININE: 2.23 mg/dL — AB (ref 0.50–1.35)
Chloride: 97 mEq/L (ref 96–112)
GFR, EST AFRICAN AMERICAN: 30 mL/min — AB (ref >90)
GFR, EST NON AFRICAN AMERICAN: 26 mL/min — AB (ref >90)
Glucose, Bld: 204 mg/dL — ABNORMAL HIGH (ref 70–99)
Potassium: 4.1 mEq/L (ref 3.7–5.3)
Sodium: 133 mEq/L — ABNORMAL LOW (ref 137–147)

## 2014-03-25 LAB — CBC
HCT: 39.6 % (ref 39.0–52.0)
Hemoglobin: 13.3 g/dL (ref 13.0–17.0)
MCH: 29.9 pg (ref 26.0–34.0)
MCHC: 33.6 g/dL (ref 30.0–36.0)
MCV: 89 fL (ref 78.0–100.0)
Platelets: 175 10*3/uL (ref 150–400)
RBC: 4.45 MIL/uL (ref 4.22–5.81)
RDW: 15.1 % (ref 11.5–15.5)
WBC: 9.5 10*3/uL (ref 4.0–10.5)

## 2014-03-25 LAB — GLUCOSE, CAPILLARY
GLUCOSE-CAPILLARY: 189 mg/dL — AB (ref 70–99)
GLUCOSE-CAPILLARY: 193 mg/dL — AB (ref 70–99)
GLUCOSE-CAPILLARY: 142 mg/dL — AB (ref 70–99)
GLUCOSE-CAPILLARY: 146 mg/dL — AB (ref 70–99)
GLUCOSE-CAPILLARY: 188 mg/dL — AB (ref 70–99)

## 2014-03-25 NOTE — Progress Notes (Signed)
Inpatient Diabetes Program Recommendations  AACE/ADA: New Consensus Statement on Inpatient Glycemic Control (2013)  Target Ranges:  Prepandial:   less than 140 mg/dL      Peak postprandial:   less than 180 mg/dL (1-2 hours)      Critically ill patients:  140 - 180 mg/dL   Reason for Visit: Results for [name], [name] (MRN [id number]) as of 03/25/2014 11:33  Ref. Range 03/24/2014 07:18 03/24/2014 12:16 03/24/2014 16:03 03/24/2014 20:58 03/25/2014 07:29  Glucose-Capillary Latest Range: 70-99 mg/dL 172 (H) 220 (H) 190 (H) 189 (H) 193 (H)   Diabetes history: Type 2 diabetes Outpatient Diabetes medications: Lantus 15 units daily Current orders for Inpatient glycemic control: Novolog sensitive tid with meals  Consider restarting a portion of patient's home dose of Lantus.  May consider Lantus 8 units q HS.  Thanks, Adah Perl, RN, BC-ADM Inpatient Diabetes Coordinator Pager [phone]

## 2014-03-25 NOTE — Progress Notes (Signed)
Assuming care of patient, agree with previously charted assessment. Will continue to monitor. J.Cherre Kothari, RN

## 2014-03-25 NOTE — Progress Notes (Signed)
    Subjective:  Denies CP or dyspnea. Reports he walked some yesterday.    Objective:  Filed Vitals:   03/24/14 0624 03/24/14 1401 03/24/14 2105 03/25/14 0437  BP: 129/61 97/48 133/71 129/65  Pulse: 61 64 65 67  Temp: 98.2 F (36.8 C) 97.6 F (36.4 C) 98.6 F (37 C) 98.8 F (37.1 C)  TempSrc: Oral Oral Oral Oral  Resp: 16 18 20 16   Height:      Weight: 162 lb 11.2 oz (73.8 kg)   162 lb (73.483 kg)  SpO2: 97% 97% 95% 94%    Intake/Output from previous day:  Intake/Output Summary (Last 24 hours) at 03/25/14 0704 Last data filed at 03/24/14 2105  Gross per 24 hour  Intake    600 ml  Output      0 ml  Net    600 ml    Physical Exam: Physical exam: Well-developed well-nourished in no acute distress.  Skin is warm and dry.  HEENT is normal.  Neck is supple.  Chest is clear to auscultation with normal expansion.  Cardiovascular exam is irregular Abdominal exam nontender or distended. No masses palpated. Extremities show no edema. neuro grossly intact    Lab Results: Basic Metabolic Panel:  Recent Labs  03/24/14 0608 03/25/14 0442  NA 137 133*  K 4.5 4.1  CL 100 97  CO2 22 22  GLUCOSE 186* 204*  BUN 34* 44*  CREATININE 2.17* 2.23*  CALCIUM 9.3 9.3   CBC:  Recent Labs  03/24/14 0608 03/25/14 0442  WBC 12.0* 9.5  HGB 14.1 13.3  HCT 42.7 39.6  MCV 89.9 89.0  PLT 194 175   Cardiac Enzymes:  Recent Labs  03/22/14 1645 03/22/14 2150 03/23/14 0400  TROPONINI <0.30 <0.30 <0.30     Assessment/Plan:  1 cardiomyopathy-patient's LV function is severely reduced. This may be related to elevated blood pressure. BP much improved this AM. Will continue labetalol  and cardizem 2 acute on chronic systolic congestive heart failure- patient does not appear to be  volume overloaded now. Creatinine a little higher today. Diuretics and ARB held yesterday. Will need to resume oral diuretics when creatinine stabilizes. Resume ARB also when creatinine better.  3  atrial fibrillation- rate well controlled-continue Cardizem and beta blocker. He has not been felt to be a Coumadin candidate given history of intracranial hemorrhage. Continue aspirin. 4 previous biventricular ICD. Currently paced. 5 hypertension-blood pressure much improved 6 hypokalemia-now normal. 7 coronary artery disease-continue aspirin. 8 Abnormal chest CT-needs FU; per IM and pulmonary  FU from cardiology with Dr Sallyanne Kuster at [place] 03/25/2014, 7:04 AM

## 2014-03-25 NOTE — Progress Notes (Signed)
PROGRESS NOTE  Troy Warren QIO:[id number] DOB: [birth year] DOA: 03/22/2014 PCP: Alesia Richards, MD  Assessment/Plan:  AKI on CKD stage II - III - continues to get worse but stabilized overall,  - in the setting of diuresis, continue to hold Lasix and ARB today, repeat BMP in am - he had CT angio chest on 4/6, a bit late for contrast induced but could contribute to overall picture  Acute encephalopathy  - unclear etiology still, back to baseline per family. No lethargy noted. Ammonia normal, troponins normal x 3. Likely fluctuating mental status in the setting of dementia  Chronic Systolic heart failure - patient had a 2-D echo last hospitalization which resulted after discharge, showing an EF of 25-30%, which is a new decrease in ejection fraction compared to his previous most recent known EF as per cardiology. - cardiology consulted, appreciate input.  - hold diuresis and ARB today given worsening renal function.   Hypertension  - currently on Diltiazem 120 TID, Imdur 90 daily, Labetalol 300 BID. Controlled.  Type 2 diabetes - she was noted to have well controlled sugars during his previous hospitalization without the need for Lantus, continue sliding scale for now.   Atrial fibrillation - continue Cardizem. He is on aspirin.   Liver cirrhosis - appears compensated  Status post bIV-ICD   CAD - continue home medications   Vascular dementia - continue home medications   Hypothyroidism - TSH was normal. Continue Synthroid.   Possible pneumonia during last hospitalization - continue levofloxacin, last dose today  Hyperlipidemia   Diet: NPO pending SLP, advance to heart healthy Fluids: none DVT Prophylaxis: Lovenox  Code Status: Full Family Communication: none this morning  Disposition Plan: Home when ready  Consultants:  Cardiology   Procedures:  none   Antibiotics Levofloxacin 4/6 >>  HPI/Subjective: - feeling well this morning, denies chest  pain or breathing difficulties. Alert and appropriate but with underlying confusion. Asking to go home.   Objective: Filed Vitals:   03/24/14 0624 03/24/14 1401 03/24/14 2105 03/25/14 0437  BP: 129/61 97/48 133/71 129/65  Pulse: 61 64 65 67  Temp: 98.2 F (36.8 C) 97.6 F (36.4 C) 98.6 F (37 C) 98.8 F (37.1 C)  TempSrc: Oral Oral Oral Oral  Resp: 16 18 20 16   Height:      Weight: 73.8 kg (162 lb 11.2 oz)   73.483 kg (162 lb)  SpO2: 97% 97% 95% 94%    Intake/Output Summary (Last 24 hours) at 03/25/14 0647 Last data filed at 03/24/14 2105  Gross per 24 hour  Intake    840 ml  Output    200 ml  Net    640 ml   Filed Weights   03/22/14 1640 03/24/14 0624 03/25/14 0437  Weight: 73.6 kg (162 lb 4.1 oz) 73.8 kg (162 lb 11.2 oz) 73.483 kg (162 lb)   Exam:  General:  NAD  Cardiovascular: regular rate and rhythm, without MRG  Respiratory: good air movement, clear to auscultation throughout, no wheezing, ronchi or rales  Abdomen: soft, not tender to palpation, positive bowel sounds  MSK: no peripheral edema  Neuro: non focal  Data Reviewed: Basic Metabolic Panel:  Recent Labs Lab 03/20/14 0357 03/22/14 1020 03/23/14 0400 03/24/14 0608 03/25/14 0442  NA 139 141 140 137 133*  K 3.4* 4.0 3.3* 4.5 4.1  CL 102 105 101 100 97  CO2 26 24 27 22 22   GLUCOSE 115* 168* 146* 186* 204*  BUN 13 10 11  34*  44*  CREATININE 1.13 0.96 1.06 2.17* 2.23*  CALCIUM 8.4 9.0 9.3 9.3 9.3   Liver Function Tests:  Recent Labs Lab 03/19/14 0530 03/20/14 0357 03/22/14 1020 03/23/14 0400  AST 261* 71* 19 17  ALT 85* 59* 26 22  ALKPHOS 269* 218* 186* 188*  BILITOT 3.4* 2.1* 0.9 1.0  PROT 6.9 5.5* 6.3 6.6  ALBUMIN 3.6 2.9* 3.3* 3.4*    Recent Labs Lab 03/22/14 1645  AMMONIA 16   CBC:  Recent Labs Lab 03/20/14 0357 03/22/14 1020 03/23/14 0400 03/24/14 0608 03/25/14 0442  WBC 7.4 7.8 8.6 12.0* 9.5  HGB 11.8* 12.9* 13.9 14.1 13.3  HCT 35.4* 39.1 41.5 42.7 39.6  MCV  88.9 89.3 88.9 89.9 89.0  PLT 146* 156 161 194 175   Cardiac Enzymes:  Recent Labs Lab 03/22/14 1030 03/22/14 1645 03/22/14 2150 03/23/14 0400  TROPONINI <0.30 <0.30 <0.30 <0.30   BNP (last 3 results)  Recent Labs  03/18/14 2050  PROBNP 4169.0*   CBG:  Recent Labs Lab 03/23/14 2122 03/24/14 0718 03/24/14 1216 03/24/14 1603 03/24/14 2058  GLUCAP 188* 172* 220* 190* 189*    Recent Results (from the past 240 hour(s))  MRSA PCR SCREENING     Status: None   Collection Time    03/18/14  9:04 PM      Result Value Ref Range Status   MRSA by PCR NEGATIVE  NEGATIVE Final   Comment:            The GeneXpert MRSA Assay (FDA     approved for NASAL specimens     only), is one component of a     comprehensive MRSA colonization     surveillance program. It is not     intended to diagnose MRSA     infection nor to guide or     monitor treatment for     MRSA infections.  CULTURE, BLOOD (ROUTINE X 2)     Status: None   Collection Time    03/19/14  1:00 PM      Result Value Ref Range Status   Specimen Description BLOOD RIGHT ARM   Final   Special Requests BOTTLES DRAWN AEROBIC AND ANAEROBIC 5CC   Final   Culture  Setup Time     Final   Value: 03/19/2014 16:10     Performed at [facility]   Culture     Final   Value:        BLOOD CULTURE RECEIVED NO GROWTH TO DATE CULTURE WILL BE HELD FOR 5 DAYS BEFORE ISSUING A FINAL NEGATIVE REPORT     Performed at [facility]   Report Status PENDING   Incomplete  CULTURE, BLOOD (ROUTINE X 2)     Status: None   Collection Time    03/19/14  1:05 PM      Result Value Ref Range Status   Specimen Description BLOOD LEFT ARM   Final   Special Requests BOTTLES DRAWN AEROBIC AND ANAEROBIC [facility]   Final   Culture  Setup Time     Final   Value: 03/19/2014 16:10     Performed at [facility]   Culture     Final   Value:        BLOOD CULTURE RECEIVED NO GROWTH TO DATE CULTURE WILL BE HELD FOR 5 DAYS BEFORE ISSUING A  FINAL NEGATIVE REPORT     Performed at [facility]   Report Status PENDING   Incomplete  CULTURE, BLOOD (ROUTINE X 2)     Status: None   Collection Time    03/22/14 11:17 AM      Result Value Ref Range Status   Specimen Description BLOOD LEFT ANTECUBITAL   Final   Special Requests     Final   Value: BOTTLES DRAWN AEROBIC AND ANAEROBIC 5ML ANA 3ML AER   Culture  Setup Time     Final   Value: 03/22/2014 14:08     Performed at [facility]   Culture     Final   Value:        BLOOD CULTURE RECEIVED NO GROWTH TO DATE CULTURE WILL BE HELD FOR 5 DAYS BEFORE ISSUING A FINAL NEGATIVE REPORT     Performed at [facility]   Report Status PENDING   Incomplete  CULTURE, BLOOD (ROUTINE X 2)     Status: None   Collection Time    03/22/14 11:19 AM      Result Value Ref Range Status   Specimen Description BLOOD RIGHT FOREARM   Final   Special Requests BOTTLES DRAWN AEROBIC AND ANAEROBIC 5ML   Final   Culture  Setup Time     Final   Value: 03/22/2014 14:08     Performed at [facility]   Culture     Final   Value:        BLOOD CULTURE RECEIVED NO GROWTH TO DATE CULTURE WILL BE HELD FOR 5 DAYS BEFORE ISSUING A FINAL NEGATIVE REPORT     Performed at [facility]   Report Status PENDING   Incomplete  MRSA PCR SCREENING     Status: None   Collection Time    03/22/14  5:02 PM      Result Value Ref Range Status   MRSA by PCR NEGATIVE  NEGATIVE Final   Comment:            The GeneXpert MRSA Assay (FDA     approved for NASAL specimens     only), is one component of a     comprehensive MRSA colonization     surveillance program. It is not     intended to diagnose MRSA     infection nor to guide or     monitor treatment for     MRSA infections.     Studies: No results found.  Scheduled Meds: . aspirin  81 mg Oral Daily  . citalopram  20 mg Oral Daily  . diltiazem  120 mg Oral TID  . donepezil  10 mg Oral Daily  . dorzolamide-timolol  1 drop Both  Eyes BID  . enoxaparin (LOVENOX) injection  30 mg Subcutaneous Q24H  . insulin aspart  0-9 Units Subcutaneous TID WC  . isosorbide mononitrate  90 mg Oral Daily  . labetalol  300 mg Oral BID  . [START ON 03/26/2014] levofloxacin  750 mg Oral Q48H  . levothyroxine  75 mcg Oral QAC breakfast  . sodium chloride  3 mL Intravenous Q12H   Continuous Infusions:   Principal Problem:   Acute encephalopathy Active Problems:   Dementia, vascular   Hypertension   Ischemic heart disease   Hyperlipemia   CAD (coronary artery disease)   Type II or unspecified type diabetes mellitus without mention of complication, not stated as uncontrolled   Permanent atrial fibrillation   ICD, biventricular, in situ - Medtronic Protecta 2013, leads 2009   Altered mental status   Acute on chronic systolic heart failure  Hypothyroidism  Time spent: 25  This note has been created with Surveyor, quantity. Any transcriptional errors are unintentional.   Marzetta Board, MD [facility] Pager [phone]. If 7 PM - 7 AM, please contact night-coverage at [email or web address], password [facility] 03/25/2014, 6:47 AM  LOS: 3 days

## 2014-03-26 LAB — BASIC METABOLIC PANEL
BUN: 38 mg/dL — AB (ref 6–23)
CALCIUM: 9.4 mg/dL (ref 8.4–10.5)
CHLORIDE: 102 meq/L (ref 96–112)
CO2: 24 mEq/L (ref 19–32)
Creatinine, Ser: 1.61 mg/dL — ABNORMAL HIGH (ref 0.50–1.35)
GFR calc non Af Amer: 39 mL/min — ABNORMAL LOW (ref >90)
GFR, EST AFRICAN AMERICAN: 45 mL/min — AB (ref >90)
Glucose, Bld: 207 mg/dL — ABNORMAL HIGH (ref 70–99)
Potassium: 3.9 mEq/L (ref 3.7–5.3)
Sodium: 140 mEq/L (ref 137–147)

## 2014-03-26 LAB — GLUCOSE, CAPILLARY: Glucose-Capillary: 205 mg/dL — ABNORMAL HIGH (ref 70–99)

## 2014-03-26 MED ORDER — FUROSEMIDE 40 MG PO TABS
40.0000 mg | ORAL_TABLET | Freq: Every day | ORAL | Status: DC
  Administered 2014-03-26: 40 mg via ORAL
  Filled 2014-03-26: qty 1

## 2014-03-26 NOTE — Discharge Instructions (Signed)
You were cared for by a hospitalist during your hospital stay. If you have any questions about your discharge medications or the care you received while you were in the hospital after you are discharged, you can call the unit and asked to speak with the hospitalist on call if the hospitalist that took care of you is not available. Once you are discharged, your primary care physician will handle any further medical issues. Please note that NO REFILLS for any discharge medications will be authorized once you are discharged, as it is imperative that you return to your primary care physician (or establish a relationship with a primary care physician if you do not have one) for your aftercare needs so that they can reassess your need for medications and monitor your lab values.     If you do not have a primary care physician, you can call [phone] for a physician referral.  Follow with Primary MD Troy Richards, MD in 5-7 days     Please hold your losartan (COZAAR) until you see your regular doctor and renal function if rechecked.      Get CBC, CMP checked by your doctor and again as further instructed.  Get a 2 view Chest X ray done next visit if you had Pneumonia of Lung problems at the [facility] reviewed and adjusted.  Please request your Prim.MD to go over all Hospital Tests and Procedure/Radiological results at the follow up, please get all Hospital records sent to your Prim MD by signing hospital release before you go home.  Activity: As tolerated with Full fall precautions use walker/cane & assistance as needed  Diet: heart healthy  For Heart failure patients - Check your Weight same time everyday, if you gain over 2 pounds, or you develop in leg swelling, experience more shortness of breath or chest pain, call your Primary MD immediately. Follow Cardiac Low Salt Diet and 1.8 lit/day fluid restriction.  Disposition Home   If you experience worsening of your  admission symptoms, develop shortness of breath, life threatening emergency, suicidal or homicidal thoughts you must seek medical attention immediately by calling 911 or calling your MD immediately  if symptoms less severe.  You Must read complete instructions/literature along with all the possible adverse reactions/side effects for all the Medicines you take and that have been prescribed to you. Take any new Medicines after you have completely understood and accpet all the possible adverse reactions/side effects.   Do not drive and provide baby sitting services if your were admitted for syncope or siezures until you have seen by Primary MD or a Neurologist and advised to do so again.  Do not drive when taking Pain medications.   Do not take more than prescribed Pain, Sleep and Anxiety Medications  Special Instructions: If you have smoked or chewed Tobacco  in the last 2 yrs please stop smoking, stop any regular Alcohol  and or any Recreational drug use.  Wear Seat belts while driving.

## 2014-03-26 NOTE — Discharge Summary (Signed)
Physician Discharge Summary  Troy Warren WCH:[id number] DOB: Jun 16, 1931 DOA: 03/22/2014  PCP: Alesia Richards, MD  Admit date: 03/22/2014 Discharge date: 03/26/2014  Time spent: 35 minutes  Recommendations for Outpatient Follow-up:  1. Follow up with PCP in 3-4 days 2. Follow up with cardiology as an outpatient   Recommendations for primary care physician for things to follow:  Repeat BMP for renal function, restart Losartan if renal function is improving.   Discharge Diagnoses:  Principal Problem:   Acute encephalopathy Active Problems:   Dementia, vascular   Hypertension   Ischemic heart disease   Hyperlipemia   CAD (coronary artery disease)   Type II or unspecified type diabetes mellitus without mention of complication, not stated as uncontrolled   Permanent atrial fibrillation   ICD, biventricular, in situ - Medtronic Protecta 2013, leads 2009   Altered mental status   Acute on chronic systolic heart failure   Hypothyroidism   CKD (chronic kidney disease) stage 2, GFR 60-89 ml/min  Discharge Condition: stable  Diet recommendation: hear healthy  Filed Weights   03/24/14 0624 03/25/14 0437 03/26/14 0614  Weight: 73.8 kg (162 lb 11.2 oz) 73.483 kg (162 lb) 72.8 kg (160 lb 7.9 oz)   History of present illness:  Troy Warren is a 80 y.o. male has a past medical history significant for dementia, CAD s/p CABG, ischemic cardiomyopathy-but with improvement in his ejection fraction to 60-65% following CRT per his cardiologist, Dr. Sallyanne Warren, status post biventricular ICD, atrial fibrillation (on aspirin), and vascular dementia, is being brought to the ED by family due to confusion, lethargy, decreased responsiveness and worsening mental status. Patient is drowsy on my evaluation but wakes up and has no idea why he is in the hospital. Denies chest pain, SOB, abdominal pain, denies fever/chills and is otherwise asymptomatic. He was hospitalized 4/6 to 4/9 with  hemoptysis in the setting of Pneumonia vs lung mass, improved and discharged home stable on Levofloxacin. Patient cannot relate much as to what happened in the past day. I discussed with his daughter, and she tells me that patient, after leaving the hospital went to a motel where currently his wife is staying, because their house is undergoing repairs. He seemed a bit more sleepy and confused last night, and they called the PCPs office to set up an appointment for this morning. However, towards the early hours of the morning, he was so lethargic that they couldn't wake him and decided to bring him to the emergency room. In the ED, patient hypertensive but otherwise his blood work is unremarkable, chest x-ray shows improvement in his airspace disease, and urinalysis unremarkable. Because of lethargy, will monitor patient to step down overnight.  Hospital Course:  Acute encephalopathy - no clear etiology for his acute encephalopathy, this is likely related to the fact that patient went to a motel, in an unfamiliar environment, which in the setting of his dementia might have contributed to this. Because of his liver disease, and ammonia level was checked, and was normal. Cardiac enzymes were normal x3. AKI on CKD stage II - III - patient was initially placed on IV diuresis on admission given his 2-D echo, with mild worsening of his renal function. His diuretics and ARB were held for 2 days, with subsequent improvement in his renal function. His Lasix was restarted, and patient will followup with his primary care provider this Friday, where I recommend that he has his renal function rechecked, and if he is back to normal his  ARB should be restarted. On admission last time, on 4/6 patient underwent a CT angiography chest which may have contributed to his AKI. Chronic Systolic heart failure - patient had a 2-D echo last hospitalization which resulted after discharge, showing an EF of 25-30%, which is a new decrease  in ejection fraction compared to his previous most recent known EF as per cardiology. Cardiology was consulted, and follow patient while hospitalized. Hypertension - currently on Diltiazem 120 TID, Imdur 90 daily, Labetalol 300 BID and Lasix. Controlled. Further monitor blood pressure during his PCP visit this Friday. Type 2 diabetes - she was noted to have well controlled sugars during his previous hospitalization without the need for Lantus, continue sliding scale for now.  Atrial fibrillation - continue Cardizem. He is on aspirin.  Liver cirrhosis - appears compensated  Status post bIV-ICD  CAD - continue home medications  Vascular dementia - continue home medications  Hypothyroidism - TSH was normal. Continue Synthroid.  Possible pneumonia during last hospitalization - continue levofloxacin, last dose today  Hyperlipidemia   Procedures:  None   Consultations:  Cardiology  Discharge Exam: Filed Vitals:   03/25/14 0437 03/25/14 1416 03/25/14 2300 03/26/14 0614  BP: 129/65 104/55 110/62 144/62  Pulse: 67 60 65 59  Temp: 98.8 F (37.1 C) 97.7 F (36.5 C) 97 F (36.1 C) 98.2 F (36.8 C)  TempSrc: Oral Oral Oral Oral  Resp: 16 18 18 20   Height:      Weight: 73.483 kg (162 lb)   72.8 kg (160 lb 7.9 oz)  SpO2: 94% 92% 94% 95%   General: No acute distress, pleasant Caucasian gentleman with confusion Cardiovascular: RRR Respiratory: CTA biL  Discharge Instructions   Future Appointments Provider Department Dept Phone   03/29/2014 11:45 AM Unk Pinto, MD [place] [facility] [phone]   04/10/2014 11:00 AM Kathee Delton, MD [facility] [phone]   04/10/2014 11:45 AM Unk Pinto, MD [place] [phone]   04/24/2014 8:35 AM [facility] [place] [facility] [phone]       Medication List    STOP taking these medications       levofloxacin 500 MG tablet    Commonly known as:  LEVAQUIN     losartan 100 MG tablet  Commonly known as:  COZAAR      TAKE these medications       ALPRAZolam 1 MG tablet  Commonly known as:  XANAX  Take 0.5 mg by mouth at bedtime. Take every night per wife     aspirin 81 MG chewable tablet  Chew 81 mg by mouth daily.     citalopram 40 MG tablet  Commonly known as:  CELEXA  Take 20 mg by mouth. Takes  1/2 of a 40 mg pill to equal 20 mg     diltiazem 120 MG tablet  Commonly known as:  CARDIZEM  Take 1 tablet (120 mg total) by mouth 3 (three) times daily.     diphenoxylate-atropine 2.5-0.025 MG per tablet  Commonly known as:  LOMOTIL  TAKE 1 TABLET BY MOUTH 4 TIMES A DAY AS NEEDED FOR DIARRHEA     donepezil 10 MG tablet  Commonly known as:  ARICEPT  Take 1 tablet (10 mg total) by mouth daily.     dorzolamide-timolol 22.3-6.8 MG/ML ophthalmic solution  Commonly known as:  COSOPT  Place 1 drop into both eyes 2 (two) times daily.     ferrous fumarate 325 (106  FE) MG Tabs tablet  Commonly known as:  HEMOCYTE - 106 mg FE  Take 1 tablet by mouth 2 (two) times daily.     furosemide 40 MG tablet  Commonly known as:  LASIX  Take 1 tablet (40 mg total) by mouth daily.     ICAPS Caps  Take 1 capsule by mouth daily.     insulin glargine 100 UNIT/ML injection  Commonly known as:  LANTUS  Inject 0.15 mLs (15 Units total) into the skin daily after breakfast. This was changed since CBGs better controlled during Hospital stay, may titrate up as needed after FU with PCP in 1 week if CBGs trending up at home     isosorbide mononitrate 60 MG 24 hr tablet  Commonly known as:  IMDUR  Take 90 mg by mouth daily. Pt takes 1and 1/2 of the 60 mg pill to equal 90 mg.     labetalol 200 MG tablet  Commonly known as:  NORMODYNE  Take 200 mg by mouth 2 (two) times daily.     levothyroxine 75 MCG tablet  Commonly known as:  SYNTHROID, LEVOTHROID  TAKE 1 TABLET EVERY DAY     meloxicam 15 MG tablet  Commonly known as:   MOBIC  Take 15 mg by mouth as needed for pain.     potassium chloride 10 MEQ tablet  Commonly known as:  KLOR-CON 10  TAKE 1 TABLET TWICE DAILY     pravastatin 40 MG tablet  Commonly known as:  PRAVACHOL  Take 40 mg by mouth daily.     VITAMIN B-12 PO  Take 1 tablet by mouth daily.     VITAMIN D PO  Take 4,000 Int'l Units by mouth daily.           Follow-up Information   Follow up with Alesia Richards, MD In 3 days.   Specialty:  Internal Medicine   Contact information:   [address] [place] [address] [phone]       The results of significant diagnostics from this hospitalization (including imaging, microbiology, ancillary and laboratory) are listed below for reference.    Significant Diagnostic Studies: Dg Chest 2 View  03/19/2014   CLINICAL DATA:  Shortness of breath.  Question pneumonia.  EXAM: CHEST  2 VIEW  COMPARISON:  CT and plain film of the chest 03/18/2014.  FINDINGS: There is cardiomegaly. Small bilateral pleural effusions are identified. There is interstitial pulmonary edema. Airspace opacity in the right lower lung zone is now seen. Right perihilar airspace opacity is unchanged.  IMPRESSION: Cardiomegaly interstitial edema.  Right perihilar opacity is again seen. Right lower lobe airspace disease has worsened. Findings could be due to progressive pneumonia and/or edema. Recommend followup films to clearing.   Electronically Signed   By: Inge Rise M.D.   On: 03/19/2014 14:49   Dg Chest 2 View  03/18/2014   CLINICAL DATA:  Hemoptysis  EXAM: CHEST  2 VIEW  COMPARISON:  10/23/2012  FINDINGS: Cardiomegaly is noted. Cardiac pacemaker and AICD leads are unchanged in position. There is right perihilar infiltrate or infiltrative process. Further evaluation with enhanced CT scan is recommended. Small left pleural effusion. Osteopenia and degenerative changes thoracic spine. No pulmonary edema. Status post median sternotomy.  IMPRESSION:  Cardiomegaly. Status post median sternotomy. Right perihilar infiltrate or infiltrative process. Further evaluation with CT scan is recommended. Small left pleural effusion. No pulmonary edema.   Electronically Signed   By: Lahoma Crocker M.D.   On:  03/18/2014 16:23   Ct Head Wo Contrast  03/22/2014   CLINICAL DATA:  Altered mental status.  Weakness in dementia.  EXAM: CT HEAD WITHOUT CONTRAST  TECHNIQUE: Contiguous axial images were obtained from the base of the skull through the vertex without intravenous contrast.  COMPARISON:  01/04/2014  FINDINGS: The brain shows generalized atrophy. There is old infarction in the left middle cerebral artery territory affecting the temporal lobe, with ex vacuo enlargement of the temporal horn of the left lateral ventricle. There are chronic small vessel changes throughout the hemispheric white matter. Chronic small-vessel changes affect the pons. There are old small vessel cerebellar infarctions. No sign of acute infarction, mass lesion, hemorrhage, hydrocephalus or extra-axial collection. No calvarial abnormality. Sinuses, middle ears and mastoids are clear.  IMPRESSION: No acute finding. 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 Extensive chronic small vessel disease.   Electronically Signed   By: Nelson Chimes M.D.   On: 03/22/2014 11:31   Ct Angio Chest Pe W/cm &/or Wo Cm  03/18/2014   CLINICAL DATA:  Chest pain and shortness of breath.  EXAM: CT ANGIOGRAPHY CHEST WITH CONTRAST  TECHNIQUE: Multidetector CT imaging of the chest was performed using the standard protocol during bolus administration of intravenous contrast. Multiplanar CT image reconstructions and MIPs were obtained to evaluate the vascular anatomy. Right perihilar abnormality on today's chest x-ray  CONTRAST:  12mL OMNIPAQUE IOHEXOL 350 MG/ML SOLN  COMPARISON:  DG CHEST 2 VIEW dated 03/18/2014  FINDINGS: Contrast within the pulmonary arterial tree is normal in  appearance. There are no filling defects to suggest an acute pulmonary embolism. The caliber of the thoracic aorta is normal. There is limited contrast present in the thoracic aorta and thus it is difficult to totally exclude a false a lumen. The cardiac chambers are enlarged. There are coronary artery calcifications present. A permanent pacemaker/defibrillator is present.  There are enlarged right hilar as well as mediastinal lymph nodes. A right hilar lymph node on image 47 of series 4 measures 1.8 cm in short axis. A second node at the same level more posteriorly measures 1.6 cm. A precarinal lymph node on image 36 of series 4 measures 1.9 cmis. A node just lateral to the proximal left main pulmonary artery measures 1.4 cm. A lymph node posterior to the right main pulmonary artery measures 1.5 cm in short axis. The thoracic esophagus is not abnormally distended. The thyroid gland appears heterogeneous in density.  There is a moderate-sized right pleural effusion layering posteriorly. There is increased interstitial density in the right upper lobe centered in the perihilar region and to a lesser extent in the right lower lobe. There is confluent parenchymal density in the right lower lobe more inferiorly adjacent to the pleural effusion. On the left there is no significant pleural effusion, but there is minimal posterior basal atelectasis. Both lungs demonstrate emphysematous changes.  Within the upper abdomen the observed portions of the liver and spleen appear normal.  The thoracic vertebral bodies are preserved in height. There is degenerative disc changes in the mid and lower thoracic spine.  Review of the MIP images confirms the above findings.  IMPRESSION: 1. There is no evidence of an acute pulmonary embolism. 2. There are emphysematous changes in both lungs. However, abnormal interstitial density in the right perihilar region and interstitial and alveolar density in the right lower lobe is present and  worrisome for pneumonia or malignancy. 3. There are enlarged mediastinal and right  hilar lymph nodes. There is a moderate-sized right pleural effusion. 4. There are post CABG changes.  The cardiac chambers are enlarged. 5. These results were called by telephone at the time of interpretation on 03/18/2014 at 5:41 PM to Dr. Marena Chancy , who verbally acknowledged these results.   Electronically Signed   By: David  [place]   On: 03/18/2014 17:41   [place] Chest  03/19/2014   CLINICAL DATA:  Right pleural fluid.  EXAM: CHEST ULTRASOUND  COMPARISON:  None.  FINDINGS: Chest sonography revealed a small right pleural effusion layering in the most dependent portion of the chest. There could be other loculated fluid more superiorly. There appears to be more fluid on chest CT than was observed on sonography. Findings were reviewed realtime by the physician assistant.  IMPRESSION: Small dependent right pleural effusion.   Electronically Signed   By: Davonna Belling M.D.   On: 03/19/2014 13:09   US Abdomen Limited  03/19/2014   CLINICAL DATA:  Elevated LFTs  EXAM: US ABDOMEN LIMITED - RIGHT UPPER QUADRANT  COMPARISON:  01/19/2011  FINDINGS: Gallbladder:  Surgically removed  Common bile duct:  Diameter: 7.4 mm which is within normal limits for the patient's age and post cholecystectomy state.  Liver:  Lobular nature likely related to underlying cirrhotic change. No focal mass lesion is seen.  IMPRESSION: Changes in the liver suggestive of cirrhosis. No focal mass lesion is noted.  Status post cholecystectomy.   Electronically Signed   By: Alcide Clever M.D.   On: 03/19/2014 09:53   Dg Chest Port 1 View  03/22/2014   CLINICAL DATA:  AMS  EXAM: PORTABLE CHEST - 1 VIEW  COMPARISON:  DG CHEST 2 VIEW dated 03/19/2014  FINDINGS: Stable cardiomegaly. Stable small bilateral effusions. There is decrease prominence of the pulmonary edema. The right perihilar opacity is also decreased in conspicuity. No new focal region consolidation. Left  chest wall dual chamber AICD appreciated. No acute osseous abnormalities.  IMPRESSION: Improving pulmonary edema. No new focal regions of consolidation. Persistent small bilateral effusions.   Electronically Signed   By: Salome Holmes M.D.   On: 03/22/2014 11:46    Microbiology: Recent Results (from the past 240 hour(s))  MRSA PCR SCREENING     Status: None   Collection Time    03/18/14  9:04 PM      Result Value Ref Range Status   MRSA by PCR NEGATIVE  NEGATIVE Final   Comment:            The GeneXpert MRSA Assay (FDA     approved for NASAL specimens     only), is one component of a     comprehensive MRSA colonization     surveillance program. It is not     intended to diagnose MRSA     infection nor to guide or     monitor treatment for     MRSA infections.  CULTURE, BLOOD (ROUTINE X 2)     Status: None   Collection Time    03/19/14  1:00 PM      Result Value Ref Range Status   Specimen Description BLOOD RIGHT ARM   Final   Special Requests BOTTLES DRAWN AEROBIC AND ANAEROBIC 5CC   Final   Culture  Setup Time     Final   Value: 03/19/2014 16:10     Performed at [facility]   Culture     Final   Value: NO GROWTH 5 DAYS  Performed at [facility]   Report Status 03/25/2014 FINAL   Final  CULTURE, BLOOD (ROUTINE X 2)     Status: None   Collection Time    03/19/14  1:05 PM      Result Value Ref Range Status   Specimen Description BLOOD LEFT ARM   Final   Special Requests BOTTLES DRAWN AEROBIC AND ANAEROBIC [facility]   Final   Culture  Setup Time     Final   Value: 03/19/2014 16:10     Performed at [facility]   Culture     Final   Value: NO GROWTH 5 DAYS     Performed at [facility]   Report Status 03/25/2014 FINAL   Final  CULTURE, BLOOD (ROUTINE X 2)     Status: None   Collection Time    03/22/14 11:17 AM      Result Value Ref Range Status   Specimen Description BLOOD LEFT ANTECUBITAL   Final   Special Requests     Final   Value:  BOTTLES DRAWN AEROBIC AND ANAEROBIC 5ML ANA 3ML AER   Culture  Setup Time     Final   Value: 03/22/2014 14:08     Performed at [facility]   Culture     Final   Value:        BLOOD CULTURE RECEIVED NO GROWTH TO DATE CULTURE WILL BE HELD FOR 5 DAYS BEFORE ISSUING A FINAL NEGATIVE REPORT     Performed at [facility]   Report Status PENDING   Incomplete  CULTURE, BLOOD (ROUTINE X 2)     Status: None   Collection Time    03/22/14 11:19 AM      Result Value Ref Range Status   Specimen Description BLOOD RIGHT FOREARM   Final   Special Requests BOTTLES DRAWN AEROBIC AND ANAEROBIC 5ML   Final   Culture  Setup Time     Final   Value: 03/22/2014 14:08     Performed at [facility]   Culture     Final   Value:        BLOOD CULTURE RECEIVED NO GROWTH TO DATE CULTURE WILL BE HELD FOR 5 DAYS BEFORE ISSUING A FINAL NEGATIVE REPORT     Performed at [facility]   Report Status PENDING   Incomplete  MRSA PCR SCREENING     Status: None   Collection Time    03/22/14  5:02 PM      Result Value Ref Range Status   MRSA by PCR NEGATIVE  NEGATIVE Final   Comment:            The GeneXpert MRSA Assay (FDA     approved for NASAL specimens     only), is one component of a     comprehensive MRSA colonization     surveillance program. It is not     intended to diagnose MRSA     infection nor to guide or     monitor treatment for     MRSA infections.     Labs: Basic Metabolic Panel:  Recent Labs Lab 03/22/14 1020 03/23/14 0400 03/24/14 0608 03/25/14 0442 03/26/14 0445  NA 141 140 137 133* 140  K 4.0 3.3* 4.5 4.1 3.9  CL 105 101 100 97 102  CO2 24 27 22 22 24   GLUCOSE 168* 146* 186* 204* 207*  BUN 10 11 34* 44* 38*  CREATININE 0.96 1.06 2.17* 2.23*  1.61*  CALCIUM 9.0 9.3 9.3 9.3 9.4   Liver Function Tests:  Recent Labs Lab 03/20/14 0357 03/22/14 1020 03/23/14 0400  AST 71* 19 17  ALT 59* 26 22  ALKPHOS 218* 186* 188*  BILITOT 2.1* 0.9 1.0  PROT  5.5* 6.3 6.6  ALBUMIN 2.9* 3.3* 3.4*   No results found for this basename: LIPASE, AMYLASE,  in the last 168 hours  Recent Labs Lab 03/22/14 1645  AMMONIA 16   CBC:  Recent Labs Lab 03/20/14 0357 03/22/14 1020 03/23/14 0400 03/24/14 0608 03/25/14 0442  WBC 7.4 7.8 8.6 12.0* 9.5  HGB 11.8* 12.9* 13.9 14.1 13.3  HCT 35.4* 39.1 41.5 42.7 39.6  MCV 88.9 89.3 88.9 89.9 89.0  PLT 146* 156 161 194 175   Cardiac Enzymes:  Recent Labs Lab 03/22/14 1030 03/22/14 1645 03/22/14 2150 03/23/14 0400  TROPONINI <0.30 <0.30 <0.30 <0.30   BNP: BNP (last 3 results)  Recent Labs  03/18/14 2050  PROBNP 4169.0*   CBG:  Recent Labs Lab 03/25/14 0729 03/25/14 1201 03/25/14 1233 03/25/14 1700 03/26/14 0752  GLUCAP 193* 142* 146* 188* 205*    Signed:  Cambryn Charters M Marianny Goris  [facility] 03/26/2014, 1:27 PM

## 2014-03-26 NOTE — Progress Notes (Signed)
    Subjective:  Denies CP or dyspnea. Reports he walked some yesterday.  Telemetry: V paced   Objective:  Filed Vitals:   03/25/14 0437 03/25/14 1416 03/25/14 2300 03/26/14 0614  BP: 129/65 104/55 110/62 144/62  Pulse: 67 60 65 59  Temp: 98.8 F (37.1 C) 97.7 F (36.5 C) 97 F (36.1 C) 98.2 F (36.8 C)  TempSrc: Oral Oral Oral Oral  Resp: 16 18 18 20   Height:      Weight: 162 lb (73.483 kg)   160 lb 7.9 oz (72.8 kg)  SpO2: 94% 92% 94% 95%    Intake/Output from previous day:  Intake/Output Summary (Last 24 hours) at 03/26/14 0704 Last data filed at 03/25/14 1747  Gross per 24 hour  Intake    600 ml  Output      0 ml  Net    600 ml    Physical Exam: Physical exam: Well-developed well-nourished in no acute distress.  Skin is warm and dry.  HEENT is normal.  Neck is supple.  Chest is clear to auscultation with normal expansion.  Cardiovascular exam is irregular Abdominal exam nontender or distended. No masses palpated. Extremities show no edema. neuro grossly intact    Lab Results: Basic Metabolic Panel:  Recent Labs  03/25/14 0442 03/26/14 0445  NA 133* 140  K 4.1 3.9  CL 97 102  CO2 22 24  GLUCOSE 204* 207*  BUN 44* 38*  CREATININE 2.23* 1.61*  CALCIUM 9.3 9.4   CBC:  Recent Labs  03/24/14 0608 03/25/14 0442  WBC 12.0* 9.5  HGB 14.1 13.3  HCT 42.7 39.6  MCV 89.9 89.0  PLT 194 175   Cardiac Enzymes: No results found for this basename: CKTOTAL, CKMB, CKMBINDEX, TROPONINI,  in the last 72 hours   Assessment/Plan:  1. Cardiomyopathy-patient's LV function is severely reduced. EF 25-30%.  This may be related to elevated blood pressure. BP much improved this AM. Will continue labetalol  and cardizem. 2.  Acute on chronic systolic congestive heart failure- patient does not appear to be  volume overloaded now. Creatinine a improved today. Diuretics and ARB on hold. Will  resume oral diuretics. Resume ARB also when creatinine better.  3.  Atrial  fibrillation- rate well controlled-continue Cardizem and beta blocker. He has not been felt to be a Coumadin candidate given history of intracranial hemorrhage. Continue aspirin. 4.Biventricular ICD. Currently paced. 5. Hypertension-blood pressure controlled 6. Coronary artery disease-continue aspirin. 7. Abnormal chest CT-needs FU; per IM and pulmonary  FU from cardiology with Dr Sallyanne Kuster at [facility] 03/26/2014, 7:04 AM

## 2014-03-26 NOTE — Care Management Note (Signed)
    Page 1 of 1   03/26/2014     11:26:29 AM   CARE MANAGEMENT NOTE 03/26/2014  Patient:  Troy Warren, Troy Warren   Account Number:  [id number]  Date Initiated:  03/26/2014  Documentation initiated by:  Sunday Spillers  Subjective/Objective Assessment:   80 yo male admitted with AMS, CHF. PTA lived at home with family.     Action/Plan:   Home when stable.   Anticipated DC Date:  03/26/2014   Anticipated DC Plan:  [place]  CM consult      Choice offered to / List presented to:  C-4 Adult Children        HH arranged  HH-2 PT      [place]   Status of service:  Completed, signed off Medicare Important Message given?   (If response is "NO", the following Medicare IM given date fields will be blank) Date Medicare IM given:   Date Additional Medicare IM given:    Discharge Disposition:  [place]  Per UR Regulation:  Reviewed for med. necessity/level of care/duration of stay  If discussed at [place] of Stay Meetings, dates discussed:    Comments:

## 2014-03-27 ENCOUNTER — Other Ambulatory Visit: Payer: Self-pay | Admitting: Internal Medicine

## 2014-03-28 LAB — CULTURE, BLOOD (ROUTINE X 2)
CULTURE: NO GROWTH
Culture: NO GROWTH

## 2014-03-29 ENCOUNTER — Encounter: Payer: Self-pay | Admitting: Internal Medicine

## 2014-03-29 ENCOUNTER — Ambulatory Visit (INDEPENDENT_AMBULATORY_CARE_PROVIDER_SITE_OTHER): Payer: Medicare Other | Admitting: Internal Medicine

## 2014-03-29 VITALS — BP 114/66 | HR 84 | Temp 97.7°F | Resp 16 | Ht 66.0 in | Wt 166.8 lb

## 2014-03-29 DIAGNOSIS — G301 Alzheimer's disease with late onset: Principal | ICD-10-CM

## 2014-03-29 DIAGNOSIS — I10 Essential (primary) hypertension: Secondary | ICD-10-CM

## 2014-03-29 DIAGNOSIS — Z79899 Other long term (current) drug therapy: Secondary | ICD-10-CM

## 2014-03-29 DIAGNOSIS — N183 Chronic kidney disease, stage 3 unspecified: Secondary | ICD-10-CM

## 2014-03-29 DIAGNOSIS — G309 Alzheimer's disease, unspecified: Secondary | ICD-10-CM

## 2014-03-29 DIAGNOSIS — F0280 Dementia in other diseases classified elsewhere without behavioral disturbance: Secondary | ICD-10-CM

## 2014-03-29 LAB — BASIC METABOLIC PANEL WITH GFR
BUN: 24 mg/dL — ABNORMAL HIGH (ref 6–23)
CO2: 22 meq/L (ref 19–32)
CREATININE: 1.28 mg/dL (ref 0.50–1.35)
Calcium: 8.6 mg/dL (ref 8.4–10.5)
Chloride: 110 mEq/L (ref 96–112)
GFR, EST AFRICAN AMERICAN: 61 mL/min
GFR, Est Non African American: 53 mL/min — ABNORMAL LOW
GLUCOSE: 253 mg/dL — AB (ref 70–99)
Potassium: 3.8 mEq/L (ref 3.5–5.3)
Sodium: 142 mEq/L (ref 135–145)

## 2014-03-29 LAB — CBC WITH DIFFERENTIAL/PLATELET
BASOS PCT: 0 % (ref 0–1)
Basophils Absolute: 0 10*3/uL (ref 0.0–0.1)
EOS PCT: 2 % (ref 0–5)
Eosinophils Absolute: 0.2 10*3/uL (ref 0.0–0.7)
HCT: 39 % (ref 39.0–52.0)
Hemoglobin: 13.1 g/dL (ref 13.0–17.0)
Lymphocytes Relative: 10 % — ABNORMAL LOW (ref 12–46)
Lymphs Abs: 0.9 10*3/uL (ref 0.7–4.0)
MCH: 29.2 pg (ref 26.0–34.0)
MCHC: 33.6 g/dL (ref 30.0–36.0)
MCV: 87.1 fL (ref 78.0–100.0)
Monocytes Absolute: 0.5 10*3/uL (ref 0.1–1.0)
Monocytes Relative: 6 % (ref 3–12)
Neutro Abs: 7.1 10*3/uL (ref 1.7–7.7)
Neutrophils Relative %: 82 % — ABNORMAL HIGH (ref 43–77)
Platelets: 188 10*3/uL (ref 150–400)
RBC: 4.48 MIL/uL (ref 4.22–5.81)
RDW: 15 % (ref 11.5–15.5)
WBC: 8.6 10*3/uL (ref 4.0–10.5)

## 2014-03-29 NOTE — Progress Notes (Signed)
Subjective:    Patient ID: Troy Warren, male    DOB: [birth year], 80 y.o.   MRN: [id number]  HPI Patient is a very nice 80 yo MWM with multiple co-morbidities including SDAT, HTN, ASCAD/CABG, T1 IDDM w/CKD (Stage 3), Hypothyroidism, and testicular Cancer who was recently hospitalized 4/6-4/92015 - sent home and readmitted 4/10-4/14/2015 with encepatholopathy and CHF and was diuresed with rising BUN/Creat and diuretics and Benicar were withheld. Since home, the last 3 days, patient's CBG's have ranged betw 100-250 mg%. He's had no problems with respirations or cardiac Sx's. Medication Sig  . ALPRAZolam (XANAX) 1 MG tablet Take 0.5 mg by mouth at bedtime. Take every night per wife  . aspirin 81 MG chewable tablet Chew 81 mg by mouth daily.  . Cholecalciferol (VITAMIN D PO) Take 4,000 Int'l Units by mouth daily.  . citalopram (CELEXA) 40 MG tablet Take 20 mg by mouth. Takes  1/2 of a 40 mg pill to equal 20 mg  . Cyanocobalamin (VITAMIN B-12 PO) Take 1 tablet by mouth daily.  Marland Kitchen diltiazem (CARDIZEM) 120 MG tablet Take 1 tablet (120 mg total) by mouth 3 (three) times daily.  . diphenoxylate-atropine (LOMOTIL) 2.5-0.025 MG per tablet TAKE 1 TABLET BY MOUTH 4 TIMES A DAY AS NEEDED FOR DIARRHEA  . donepezil (ARICEPT) 10 MG tablet Take 1 tablet (10 mg total) by mouth daily.  . dorzolamide-timolol (COSOPT) 22.3-6.8 MG/ML ophthalmic solution Place 1 drop into both eyes 2 (two) times daily.   . ferrous fumarate (HEMOCYTE - 106 MG FE) 325 (106 FE) MG TABS Take 1 tablet by mouth 2 (two) times daily.  . furosemide (LASIX) 40 MG tablet Take 1 tablet (40 mg total) by mouth daily.  . insulin glargine (LANTUS) 100 UNIT/ML injection Inject 0.15 mLs (25 Units total) into the skin daily after breakfast. This was changed since CBGs better controlled during Hospital stay, may titrate up as needed after FU with PCP in 1 week if CBGs trending up at home  . isosorbide mononitrate (IMDUR) 60 MG 24 hr tablet Take 90 mg  by mouth daily. Pt takes 1and 1/2 of the 60 mg pill to equal 90 mg.  . labetalol (NORMODYNE) 200 MG tablet Take 200 mg by mouth 2 (two) times daily.  Marland Kitchen LANTUS SOLOSTAR 100 UNIT/ML Solostar Pen INJECT 30 UNITS DAILY.  Marland Kitchen levothyroxine (SYNTHROID, LEVOTHROID) 75 MCG tablet TAKE 1 TABLET EVERY DAY  . meloxicam (MOBIC) 15 MG tablet Take 15 mg by mouth as needed for pain.   . Multiple Vitamins-Minerals (ICAPS) CAPS Take 1 capsule by mouth daily.  . potassium chloride (KLOR-CON 10) 10 MEQ tablet TAKE 1 TABLET TWICE DAILY  . pravastatin (PRAVACHOL) 40 MG tablet Take 40 mg by mouth daily.   Allergies  Allergen Reactions  . Lipitor [Atorvastatin]     weakness   Past Medical History  Diagnosis Date  . Hypertension   . Ischemic heart disease   . Hyperlipemia   . Depression   . Testicular cancer   . Polio     child polio  . Rheumatic fever   . Mild dementia   . Mild dementia   . Systolic heart failure     acute on chronic  . CAD (coronary artery disease)   . Chronic atrial fibrillation   . Vitamin D deficiency   . Type II or unspecified type diabetes mellitus without mention of complication, not stated as uncontrolled   . Other testicular hypofunction   . Permanent atrial fibrillation 01/21/2014  .  ICD, biventricular, in situ - Medtronic Protecta 2013, leads 2009 01/21/2014    New Generator Medtronic [place] model number [id number], serial number [id number]  Right atrial lead (chronic) Medtronic, model number C338645, serial [id number] (implanted 05/01/2008)  Right ventricular lead (chronic) Medtronic, model number C6970616, serial number [id number] (implanted 05/01/2008)  Left ventricular lead Medtronic, model number 3762, serial number [id number] (implanted 05/01/2008)  Exp   Review of Systems In addition to the HPI above,  No Fever-chills,  No Headache, No changes with Vision or hearing,  No problems swallowing food or Liquids,  No Chest pain or productive Cough or Shortness of Breath,  No  Abdominal pain, No Nausea or Vommitting, Bowel movements are regular,  No Blood in stool or Urine,  No dysuria,  No new skin rashes or bruises,  No new joints pains-aches,  No new weakness, tingling, numbness in any extremity,  No recent weight loss,  No polyuria, polydypsia or polyphagia,  A full 10 point Review of Systems was done, except as stated above, all other Review of Systems were negative  Objective:   Physical Exam  BP 114/66  Pulse 84  Temp(Src) 97.7 F (36.5 C) (Temporal)  Resp 16  Ht 5\' 6"  (1.676 m)  Wt 166 lb 12.8 oz (75.66 kg)  BMI 26.94 kg/m2  HEENT - Eac's patent. TM's Nl.EOM's full. PERRLA. NasoOroPharynx clear. Neck - supple. Nl Thyroid. No bruits nodes JVD Chest - Clear equal BS Cor - Nl HS. RRR w/o sig MGR. PP 1(+) No edema. Abd - No palpable organomegaly, masses or tenderness. BS nl. MS- FROM. w/o deformities. Muscle power tone and bulk Nl. Gait Nl. Neuro - No obvious Cr N abnormalities. Sensory, motor and Cerebellar functions appear Nl w/o focal abnormalities.Short term memory is very limited. Mild dysnomia.    Assessment & Plan:   1. Hypertension  - BASIC METABOLIC PANEL WITH GFR  2. Chronic kidney disease, stage III (moderate)   3. SDAT (senile dementia)   4. Encounter for long-term (current) use of other medications  - BASIC METABOLIC PANEL WITH GFR - CBC with Differential

## 2014-03-29 NOTE — Patient Instructions (Signed)
Type 1 Diabetes Mellitus, Adult Type 1 diabetes mellitus, often simply referred to as diabetes, is a long-term (chronic) disease. It occurs when the islet cells in the pancreas that make insulin (a hormone) are destroyed and can no longer make insulin. Insulin is needed to move sugars from food into the tissue cells. The tissue cells use the sugars for energy. In people with type 1 diabetes, the sugars build up in the blood instead of going into the tissue cells. As a result, high blood sugar (hyperglycemia) develops. Without insulin, the body breaks down fat cells for the needed energy. This breakdown of fat cells produces acid chemicals (ketones), which increases the acid levels in the body. The effect of either high ketone or sugar (glucose) levels can be life-threatening.  Type 1 diabetes was also previously called juvenile diabetes. It most often occurs before the age of 82, but it can occur at any age. RISK FACTORS A person is predisposed to developing type 1 diabetes if someone in his or her family has the disease and is exposed to certain additional environmental triggers.  SYMPTOMS  Symptoms of type 1 diabetes may develop gradually over days to weeks or suddenly. The symptoms occur due to hyperglycemia. The symptoms can include:   Increased thirst (polydipsia).  Increased urination (polyuria).  Increased urination during the night (nocturia).  Weight loss. This weight loss may be rapid.  Frequent, recurring infections.  Tiredness (fatigue).  Weakness.  Vision changes, such as blurred vision.  Fruity smell to your breath.  Abdominal pain.  Nausea or vomiting. DIAGNOSIS  Type 1 diabetes is diagnosed when symptoms of diabetes are present and when blood glucose levels are increased. Your blood glucose level may be checked by one or more of the following blood tests:  A fasting blood glucose test. You will not be allowed to eat for at least 8 hours before a blood sample is  taken.  A random blood glucose test. Your blood glucose is checked at any time of the day regardless of when you ate.  A hemoglobin A1c blood glucose test. A hemoglobin A1c test provides information about blood glucose control over the previous 3 months. TREATMENT  Although type 1 diabetes cannot be prevented, it can be managed with insulin, diet, and exercise.  You will need to take insulin daily to keep blood glucose in the desired range.  You will need to match insulin dosing with exercise and healthy food choices. The treatment goal is to maintain the before-meal blood sugar (preprandial glucose) level at 70 130 mg/dL.  HOME CARE INSTRUCTIONS   Have your hemoglobin A1c level checked twice a year.  Perform daily blood glucose monitoring as directed by your caregiver.  Monitor urine ketones when you are ill and as directed by your caregiver.  Take your insulin as directed by your caregiver to maintain your blood glucose level in the desired range.  Never run out of insulin. It is needed every day.  Adjust insulin based on your intake of carbohydrates. Carbohydrates can raise blood glucose levels but need to be included in your diet. Carbohydrates provide vitamins, minerals, and fiber, which are an essential part of a healthy diet. Carbohydrates are found in fruits, vegetables, whole grains, dairy products, legumes, and foods containing added sugars.    Eat healthy foods. Alternate 3 meals with 3 snacks.  Maintain a healthy weight.  Carry a medical alert card or wear your medical alert jewelry.  Carry a 15 gram carbohydrate snack with you  at all times to treat low blood glucose (hypoglycemia). Some examples of 15 gram carbohydrate snacks include:  Glucose tablets, 3 or 4.   Glucose gel, 15 gram tube.  Raisins, 2 tablespoons (24 grams).  Jelly beans, 6.  Animal crackers, 8.  Fruit juice, regular soda, or low-fat milk, 4 ounces (120 mL).  Gummy treats,  9.    Recognize hypoglycemia. Hypoglycemia occurs with blood glucose levels of 70 mg/dL and below. The risk for hypoglycemia increases when fasting or skipping meals, during or after intense exercise, and during sleep. Hypoglycemia symptoms can include:  Tremors or shakes.  Decreased ability to concentrate.  Sweating.  Increased heart rate.  Headache.  Dry mouth.  Hunger.  Irritability.  Anxiety.  Restless sleep.  Altered speech or coordination.  Confusion.  Treat hypoglycemia promptly. If you are alert and able to safely swallow, follow the 15:15 rule:  Take 15 20 grams of rapid-acting glucose or carbohydrate. Rapid-acting options include glucose gel, glucose tablets, or 4 ounces (120 mL) of fruit juice, regular soda, or low-fat milk.  Check your blood glucose level 15 minutes after taking the glucose.   Take 15 20 grams more of glucose if the repeat blood glucose level is still 70 mg/dL or below.  Eat a meal or snack within 1 hour once blood glucose levels return to normal.  Be alert to polyuria and polydipsia, which are early signs of hyperglycemia. An early awareness of hyperglycemia allows for prompt treatment. Treat hyperglycemia as directed by your caregiver.  Engage in at least 150 minutes of moderate-intensity physical activity a week, spread over at least 3 days of the week or as directed by your caregiver.  Adjust your insulin dosing and food intake as needed if you start a new exercise or sport.  Follow your sick day plan at any time you are unable to eat or drink as usual.   Avoid tobacco use.  Limit alcohol intake to no more than 1 drink per day for nonpregnant women and 2 drinks per day for men. You should drink alcohol only when you are also eating food. Talk with your caregiver about whether alcohol is safe for you. Tell your caregiver if you drink alcohol several times a week.  Follow up with your caregiver regularly.  Schedule an eye exam  within 5 years of diagnosis and then annually.  Perform daily skin and foot care. Examine your skin and feet daily for cuts, bruises, redness, nail problems, bleeding, blisters, or sores. A foot exam by a caregiver should be done annually.  Brush your teeth and gums at least twice a day and floss at least once a day. Follow up with your dentist regularly.  Share your diabetes management plan with your workplace or school.  Stay up-to-date with immunizations.  Learn to manage stress.  Obtain ongoing diabetes education and support as needed.  Participate or seek rehabilitation as needed to maintain or improve independence and quality of life. Request a physical or occupational therapy referral if you are having foot or hand numbness or difficulties with grooming, dressing, eating, or physical activity. SEEK MEDICAL CARE IF:   You are unable to eat food or drink fluids for more than 6 hours.  You have nausea and vomiting for more than 6 hours.  Your blood glucose level is over 240 mg/dL.  There is a change in mental status.  You develop an additional serious illness.  You have diarrhea for more than 6 hours.  You have been  sick or have had a fever for a couple of days and are not getting better.  You have pain during any physical activity. SEEK IMMEDIATE MEDICAL CARE IF:  You have difficulty breathing.  You have moderate to large ketone levels. MAKE SURE YOU:  Understand these instructions.  Will watch your condition.  Will get help right away if you are not doing well or get worse. Document Released: 11/26/2000 Document Revised: 08/23/2012 Document Reviewed: 06/27/2012 [facility] Patient Information 2014 [place].

## 2014-03-30 DIAGNOSIS — F3289 Other specified depressive episodes: Secondary | ICD-10-CM

## 2014-03-30 DIAGNOSIS — F329 Major depressive disorder, single episode, unspecified: Secondary | ICD-10-CM

## 2014-04-08 ENCOUNTER — Telehealth: Payer: Self-pay | Admitting: *Deleted

## 2014-04-08 NOTE — Telephone Encounter (Signed)
Daughter called and states patient's glucose readings from 50 to 70's.  Patient is on 25 units of Lantus daily.  Per Dr Melford Aase, reduce dose to 15 units a day.

## 2014-04-10 ENCOUNTER — Ambulatory Visit: Payer: Self-pay | Admitting: Internal Medicine

## 2014-04-10 ENCOUNTER — Inpatient Hospital Stay: Payer: Medicare Other | Admitting: Pulmonary Disease

## 2014-04-24 ENCOUNTER — Telehealth: Payer: Self-pay | Admitting: Cardiology

## 2014-04-24 ENCOUNTER — Other Ambulatory Visit: Payer: Self-pay | Admitting: Internal Medicine

## 2014-04-24 ENCOUNTER — Other Ambulatory Visit: Payer: Self-pay | Admitting: *Deleted

## 2014-04-24 ENCOUNTER — Inpatient Hospital Stay: Payer: Medicare Other | Admitting: Pulmonary Disease

## 2014-04-24 ENCOUNTER — Other Ambulatory Visit: Payer: Self-pay

## 2014-04-24 ENCOUNTER — Encounter: Payer: Medicare Other | Admitting: *Deleted

## 2014-04-24 MED ORDER — FUROSEMIDE 40 MG PO TABS: 40.0000 mg | ORAL_TABLET | Freq: Every day | ORAL | Status: DC

## 2014-04-24 MED ORDER — DONEPEZIL HCL 10 MG PO TABS: 10.0000 mg | ORAL_TABLET | Freq: Every day | ORAL | Status: DC

## 2014-04-24 NOTE — Telephone Encounter (Signed)
Spoke with pt and reminded pt of remote transmission that is due today. Pt verbalized understanding and said he would complete transmission when his wife got home.

## 2014-04-24 NOTE — Telephone Encounter (Signed)
Rx was sent to pharmacy electronically. 

## 2014-04-25 ENCOUNTER — Other Ambulatory Visit: Payer: Self-pay | Admitting: Physician Assistant

## 2014-04-25 ENCOUNTER — Encounter: Payer: Self-pay | Admitting: Cardiology

## 2014-04-25 MED ORDER — DIPHENOXYLATE-ATROPINE 2.5-0.025 MG PO TABS: ORAL_TABLET | ORAL | Status: DC

## 2014-04-26 ENCOUNTER — Telehealth: Payer: Self-pay

## 2014-04-26 NOTE — Telephone Encounter (Signed)
Pt went to pick up Rx for diarrhea and insurance is not covering Rx. Can you call something else in? [facility]

## 2014-04-26 NOTE — Telephone Encounter (Signed)
Suggest if Insurance not covering Lomotil  To try generic Imodium (or Loperamide) 2 mg - can get at any store [facility] [facility] -or really cheap at [employer]  can take up to 8 tablets/day.

## 2014-05-01 ENCOUNTER — Encounter: Payer: Self-pay | Admitting: Internal Medicine

## 2014-05-01 NOTE — Progress Notes (Signed)
Patient ID: Troy Warren, male   DOB: 29-Dec-1930, 80 y.o.   MRN: [id number]   No Show

## 2014-05-03 ENCOUNTER — Telehealth: Payer: Self-pay | Admitting: Cardiovascular Disease

## 2014-05-03 NOTE — Telephone Encounter (Signed)
New problem   Pt need to talk to device concerning letter they received about transmission.

## 2014-05-03 NOTE — Telephone Encounter (Signed)
Spoke with Government social research officer).  Instructions given to reset the transmitter and will send over the weekend.

## 2014-05-05 ENCOUNTER — Encounter: Payer: Self-pay | Admitting: Cardiovascular Disease

## 2014-05-05 LAB — MDC_IDC_ENUM_SESS_TYPE_REMOTE
Brady Statistic AP VS Percent: 0 %
Brady Statistic AS VP Percent: 83.2 %
Brady Statistic AS VS Percent: 16.8 %
Brady Statistic RA Percent Paced: 0 %
Brady Statistic RV Percent Paced: 83.2 %
Date Time Interrogation Session: 20150524164617
HIGH POWER IMPEDANCE MEASURED VALUE: 41 Ohm
HighPow Impedance: 171 Ohm
HighPow Impedance: 304 Ohm
HighPow Impedance: 53 Ohm
Lead Channel Impedance Value: 361 Ohm
Lead Channel Impedance Value: 475 Ohm
Lead Channel Impedance Value: 950 Ohm
Lead Channel Pacing Threshold Pulse Width: 1 ms
Lead Channel Sensing Intrinsic Amplitude: 1 mV
Lead Channel Sensing Intrinsic Amplitude: 1 mV
Lead Channel Sensing Intrinsic Amplitude: 7.125 mV
Lead Channel Sensing Intrinsic Amplitude: 7.125 mV
Lead Channel Setting Pacing Amplitude: 2.5 V
Lead Channel Setting Sensing Sensitivity: 0.3 mV
MDC IDC MSMT BATTERY VOLTAGE: 3.05 V
MDC IDC MSMT LEADCHNL LV IMPEDANCE VALUE: 608 Ohm
MDC IDC MSMT LEADCHNL LV PACING THRESHOLD AMPLITUDE: 2.375 V
MDC IDC MSMT LEADCHNL RV IMPEDANCE VALUE: 342 Ohm
MDC IDC MSMT LEADCHNL RV PACING THRESHOLD AMPLITUDE: 1.375 V
MDC IDC MSMT LEADCHNL RV PACING THRESHOLD PULSEWIDTH: 0.4 ms
MDC IDC SET LEADCHNL LV PACING PULSEWIDTH: 1 ms
MDC IDC SET LEADCHNL RV PACING AMPLITUDE: 2.5 V
MDC IDC SET LEADCHNL RV PACING PULSEWIDTH: 0.7 ms
MDC IDC SET ZONE DETECTION INTERVAL: 350 ms
MDC IDC SET ZONE DETECTION INTERVAL: 380 ms
MDC IDC STAT BRADY AP VP PERCENT: 0 %
Zone Setting Detection Interval: 300 ms
Zone Setting Detection Interval: 340 ms

## 2014-05-07 ENCOUNTER — Ambulatory Visit (INDEPENDENT_AMBULATORY_CARE_PROVIDER_SITE_OTHER): Payer: Medicare Other | Admitting: *Deleted

## 2014-05-07 DIAGNOSIS — I4891 Unspecified atrial fibrillation: Secondary | ICD-10-CM

## 2014-05-07 DIAGNOSIS — I4821 Permanent atrial fibrillation: Secondary | ICD-10-CM

## 2014-05-09 ENCOUNTER — Inpatient Hospital Stay: Payer: Medicare Other | Admitting: Pulmonary Disease

## 2014-05-10 ENCOUNTER — Ambulatory Visit (INDEPENDENT_AMBULATORY_CARE_PROVIDER_SITE_OTHER): Payer: Medicare Other | Admitting: Pulmonary Disease

## 2014-05-10 ENCOUNTER — Encounter: Payer: Self-pay | Admitting: Pulmonary Disease

## 2014-05-10 VITALS — BP 100/62 | HR 98 | Temp 98.0°F | Ht 67.0 in | Wt 167.0 lb

## 2014-05-10 DIAGNOSIS — R599 Enlarged lymph nodes, unspecified: Secondary | ICD-10-CM

## 2014-05-10 DIAGNOSIS — R918 Other nonspecific abnormal finding of lung field: Secondary | ICD-10-CM

## 2014-05-10 DIAGNOSIS — J90 Pleural effusion, not elsewhere classified: Secondary | ICD-10-CM

## 2014-05-10 DIAGNOSIS — R590 Localized enlarged lymph nodes: Secondary | ICD-10-CM | POA: Insufficient documentation

## 2014-05-10 HISTORY — DX: Other nonspecific abnormal finding of lung field: R91.8

## 2014-05-10 NOTE — Assessment & Plan Note (Signed)
The patient had bilateral pleural effusions right greater than left on his recent CT chest, most likely secondary to his known cardiomyopathy. Will reevaluate with the upcoming CT chest.

## 2014-05-10 NOTE — Progress Notes (Signed)
   Subjective:    Patient ID: Troy Warren, male    DOB: [birth year], 80 y.o.   MRN: [id number]  HPI The pt is an 80 y/o male who I've been asked to see for a right upper lobe pulmonary infiltrate. He was in the hospital last month with hemoptysis, and as well as increased cough. He was noted to have a right upper lobe abnormality on chest x-ray, and a CT chest revealed an interstitial/groundglass opacity in the right upper lobe. There was also significant mediastinal and hilar lymphadenopathy. He also had bilateral pleural effusions right greater than left, but has a known cardiomyopathy. The patient was treated with antibiotics and discharged home. I was asked to see him as a followup since he has not seen a pulmonologist in the hospital. The patient feels that he is much improved, and has no further hemoptysis. He does have some mild residual cough with some discolored mucus according to the wife. His appetite has improved, and his weight is stable. He denies any chest pain. The patient does have a history of smoking for many years, but has not done so for 40 years according to his history.   Review of Systems  Constitutional: Negative for fever and unexpected weight change.  HENT: Negative for congestion, dental problem, ear pain, nosebleeds, postnasal drip, rhinorrhea, sinus pressure, sneezing, sore throat and trouble swallowing.   Eyes: Negative for redness and itching.  Respiratory: Positive for cough and shortness of breath. Negative for chest tightness and wheezing.   Cardiovascular: Negative for palpitations and leg swelling.  Gastrointestinal: Negative for nausea and vomiting.  Genitourinary: Negative for dysuria.  Musculoskeletal: Negative for joint swelling.  Skin: Negative for rash.  Neurological: Negative for headaches.  Hematological: Does not bruise/bleed easily.  Psychiatric/Behavioral: Negative for dysphoric mood. The patient is not nervous/anxious.        Objective:   Physical Exam Constitutional:  Well developed, no acute distress  HENT:  Nares patent without discharge  Oropharynx without exudate, palate and uvula are normal  Eyes:  Perrla, eomi, no scleral icterus  Neck:  No JVD, no TMG  Cardiovascular:  Normal rate, regular rhythm, no rubs or gallops.  3/6 sem        Intact distal pulses but decreased  Pulmonary :  Normal breath sounds, no stridor or respiratory distress   No rales, rhonchi, or wheezing  Abdominal:  Soft, nondistended, bowel sounds present.  No tenderness noted.   Musculoskeletal:  mild lower extremity edema noted.  Lymph Nodes:  No cervical lymphadenopathy noted  Skin:  No cyanosis noted  Neurologic:  Alert, appropriate, moves all 4 extremities without obvious deficit.         Assessment & Plan:

## 2014-05-10 NOTE — Assessment & Plan Note (Signed)
The patient has an atypical infiltrate in the right upper lobe during his recent hospitalization that may simply be due to to pneumonia, but cannot exclude the possibility of bronchoalveolar cell carcinoma. He does have a long history of smoking, but has been quit for many years. He feels that he is much improved, and his lung exam is fairly clear today. Will arrange for a followup CT chest, but still abnormal, will need diagnostic bronchoscopy. I would like to try and avoid this since the patient is at increased risk of bleeding given his known cardiomyopathy.

## 2014-05-10 NOTE — Progress Notes (Signed)
Remote ICD transmission.   

## 2014-05-10 NOTE — Patient Instructions (Signed)
Will schedule for ct scan of your chest.  Will call with results. If scan much improved, no further followup is needed.  If still abnormal, will discuss further testing.

## 2014-05-10 NOTE — Assessment & Plan Note (Signed)
Hopefully this is reactive lymphadenopathy associated with pneumonia. If not, may need EBU S. for further diagnosis.

## 2014-05-15 ENCOUNTER — Ambulatory Visit: Payer: Self-pay | Admitting: Internal Medicine

## 2014-05-16 ENCOUNTER — Ambulatory Visit (INDEPENDENT_AMBULATORY_CARE_PROVIDER_SITE_OTHER)
Admission: RE | Admit: 2014-05-16 | Discharge: 2014-05-16 | Disposition: A | Discharge status: Home / Self Care | Payer: Medicare Other | Source: Ambulatory Visit | Attending: Pulmonary Disease | Admitting: Pulmonary Disease

## 2014-05-16 DIAGNOSIS — J90 Pleural effusion, not elsewhere classified: Secondary | ICD-10-CM

## 2014-05-16 DIAGNOSIS — R918 Other nonspecific abnormal finding of lung field: Secondary | ICD-10-CM

## 2014-05-16 DIAGNOSIS — R590 Localized enlarged lymph nodes: Secondary | ICD-10-CM

## 2014-05-16 DIAGNOSIS — R599 Enlarged lymph nodes, unspecified: Secondary | ICD-10-CM

## 2014-05-17 ENCOUNTER — Encounter: Payer: Self-pay | Admitting: Cardiology

## 2014-05-20 ENCOUNTER — Ambulatory Visit (INDEPENDENT_AMBULATORY_CARE_PROVIDER_SITE_OTHER): Payer: Medicare Other | Admitting: Internal Medicine

## 2014-05-20 ENCOUNTER — Encounter: Payer: Self-pay | Admitting: Internal Medicine

## 2014-05-20 ENCOUNTER — Telehealth: Payer: Self-pay | Admitting: Pulmonary Disease

## 2014-05-20 VITALS — BP 122/74 | HR 64 | Temp 98.1°F | Resp 16 | Ht 66.5 in | Wt 169.6 lb

## 2014-05-20 DIAGNOSIS — E1029 Type 1 diabetes mellitus with other diabetic kidney complication: Secondary | ICD-10-CM

## 2014-05-20 DIAGNOSIS — I10 Essential (primary) hypertension: Secondary | ICD-10-CM

## 2014-05-20 DIAGNOSIS — E559 Vitamin D deficiency, unspecified: Secondary | ICD-10-CM

## 2014-05-20 DIAGNOSIS — Z79899 Other long term (current) drug therapy: Secondary | ICD-10-CM

## 2014-05-20 DIAGNOSIS — E785 Hyperlipidemia, unspecified: Secondary | ICD-10-CM

## 2014-05-20 LAB — CBC WITH DIFFERENTIAL/PLATELET
Basophils Absolute: 0 10*3/uL (ref 0.0–0.1)
Basophils Relative: 0 % (ref 0–1)
Eosinophils Absolute: 0.2 10*3/uL (ref 0.0–0.7)
Eosinophils Relative: 2 % (ref 0–5)
HCT: 39 % (ref 39.0–52.0)
Hemoglobin: 13.2 g/dL (ref 13.0–17.0)
Lymphocytes Relative: 13 % (ref 12–46)
Lymphs Abs: 1.1 10*3/uL (ref 0.7–4.0)
MCH: 29.4 pg (ref 26.0–34.0)
MCHC: 33.8 g/dL (ref 30.0–36.0)
MCV: 86.9 fL (ref 78.0–100.0)
Monocytes Absolute: 0.5 10*3/uL (ref 0.1–1.0)
Monocytes Relative: 6 % (ref 3–12)
Neutro Abs: 6.9 10*3/uL (ref 1.7–7.7)
Neutrophils Relative %: 79 % — ABNORMAL HIGH (ref 43–77)
Platelets: 164 10*3/uL (ref 150–400)
RBC: 4.49 MIL/uL (ref 4.22–5.81)
RDW: 15.4 % (ref 11.5–15.5)
WBC: 8.7 10*3/uL (ref 4.0–10.5)

## 2014-05-20 LAB — BASIC METABOLIC PANEL WITH GFR
BUN: 15 mg/dL (ref 6–23)
CO2: 28 mEq/L (ref 19–32)
Calcium: 9.1 mg/dL (ref 8.4–10.5)
Chloride: 107 mEq/L (ref 96–112)
Creat: 1.11 mg/dL (ref 0.50–1.35)
GFR, Est African American: 72 mL/min
GFR, Est Non African American: 62 mL/min
Glucose, Bld: 99 mg/dL (ref 70–99)
Potassium: 4 mEq/L (ref 3.5–5.3)
Sodium: 141 mEq/L (ref 135–145)

## 2014-05-20 LAB — LIPID PANEL
CHOL/HDL RATIO: 2.9 ratio
Cholesterol: 132 mg/dL (ref 0–200)
HDL: 46 mg/dL (ref >39)
LDL CALC: 60 mg/dL (ref 0–99)
Triglycerides: 129 mg/dL (ref <150)
VLDL: 26 mg/dL (ref 0–40)

## 2014-05-20 LAB — HEPATIC FUNCTION PANEL
AST: 9 U/L (ref 0–37)
Albumin: 3.7 g/dL (ref 3.5–5.2)
Alkaline Phosphatase: 79 U/L (ref 39–117)
BILIRUBIN INDIRECT: 0.6 mg/dL (ref 0.2–1.2)
Bilirubin, Direct: 0.2 mg/dL (ref 0.0–0.3)
TOTAL PROTEIN: 5.8 g/dL — AB (ref 6.0–8.3)
Total Bilirubin: 0.8 mg/dL (ref 0.2–1.2)

## 2014-05-20 LAB — TSH: TSH: 0.74 u[IU]/mL (ref 0.350–4.500)

## 2014-05-20 LAB — MAGNESIUM: Magnesium: 2.1 mg/dL (ref 1.5–2.5)

## 2014-05-20 NOTE — Patient Instructions (Signed)

## 2014-05-20 NOTE — Progress Notes (Signed)
Patient ID: Troy Warren, male   DOB: March 19, 1931, 80 y.o.   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    HTN predates since 1966. BP has been controlled at home. Today's BP: 122/74 mmHg. Patient denies any cardiac type chest pain, palpitations, dyspnea/orthopnea/PND, dizziness, claudication, or dependent edema.   Hyperlipidemia is controlled with diet & meds. Last Lipids in Jan 2015 as below were at goal. Patient denies myalgias or other med SE's.  Lab Results  Component Value Date   CHOL 149 12/24/2013   HDL 57 12/24/2013   LDLCALC 66 12/24/2013   TRIG 128 12/24/2013   CHOLHDL 2.6 12/24/2013    Also, the patient has history of T1_IDDM w/ Stage CKD  since 1991 with last A1c was 6.6% in Apr 2015.  Patient's FGB's/CBG's have ranged widely betw 100- 300 mg% suspected due to poor compliance and limited comprehension. Patient denies any symptoms of reactive hypoglycemia, diabetic polys, paresthesias or visual blurring.   Further, Patient has history of Vitamin D Deficiency of 26 in 2008 with last vitamin D was 58 in Aug 2014. Patient supplements vitamin D without any suspected side-effects.   Medication List   ALPRAZolam 1 MG tablet  Commonly known as:  XANAX  Take 0.5 mg by mouth at bedtime. Take every night per wife     aspirin 81 MG chewable tablet  Chew 81 mg by mouth daily.     citalopram 40 MG tablet  Commonly known as:  CELEXA  Take 20 mg by mouth. Takes  1/2 of a 40 mg pill to equal 20 mg     diltiazem 120 MG tablet  Commonly known as:  CARDIZEM  Take 1 tablet (120 mg total) by mouth 3 (three) times daily.     diphenoxylate-atropine 2.5-0.025 MG per tablet  Commonly known as:  LOMOTIL  TAKE 1 TABLET FOUR TIMES A DAY AS NEEDED     donepezil 10 MG tablet  Commonly known as:  ARICEPT  Take 1 tablet (10 mg total) by mouth daily.      dorzolamide-timolol 22.3-6.8 MG/ML ophthalmic solution  Commonly known as:  COSOPT  Place 1 drop into both eyes 2 (two) times daily.     ferrous fumarate 325 (106 FE) MG Tabs tablet  Commonly known as:  HEMOCYTE - 106 mg FE  Take 1 tablet by mouth 2 (two) times daily.     furosemide 40 MG tablet  Commonly known as:  LASIX  Take 1 tablet (40 mg total) by mouth daily.     ICAPS Caps  Take 1 capsule by mouth daily.     insulin glargine 100 UNIT/ML injection  Commonly known as:  LANTUS  Inject 0.15 mLs (15 Units total) into the skin daily after breakfast. This was changed since CBGs better controlled during Hospital stay, may titrate up as needed after FU with PCP in 1 week if CBGs trending up at home     isosorbide mononitrate 60 MG 24 hr tablet  Commonly known as:  IMDUR  Take 90 mg by mouth daily. Pt takes 1and 1/2 of the 60 mg pill to equal 90 mg.     KLOR-CON M10 10 MEQ tablet  Generic drug:  potassium chloride     labetalol 200 MG tablet  Commonly known as:  NORMODYNE  Take 200 mg by mouth 2 (two) times daily.  levothyroxine 75 MCG tablet  Commonly known as:  SYNTHROID, LEVOTHROID  TAKE 1 TABLET EVERY DAY     losartan 100 MG tablet  Commonly known as:  COZAAR     meloxicam 15 MG tablet  Commonly known as:  MOBIC  Take 15 mg by mouth as needed for pain.     potassium chloride 10 MEQ tablet  Commonly known as:  KLOR-CON 10  TAKE 1 TABLET TWICE DAILY     pravastatin 40 MG tablet  Commonly known as:  PRAVACHOL  Take 40 mg by mouth daily.     VITAMIN B-12 PO  Take 1 tablet by mouth daily.     VITAMIN D PO  Take 4,000 Int'l Units by mouth daily.        Allergies  Allergen Reactions  . Lipitor [Atorvastatin]     weakness   PMHx:   Past Medical History  Diagnosis Date  . Hypertension   . Ischemic heart disease   . Hyperlipemia   . Depression   . Testicular cancer   . Polio     child polio  . Rheumatic fever   . Mild dementia   . Mild  dementia   . Systolic heart failure     acute on chronic  . CAD (coronary artery disease)   . Chronic atrial fibrillation   . Vitamin D deficiency   . Type II or unspecified type diabetes mellitus without mention of complication, not stated as uncontrolled   . Other testicular hypofunction   . Permanent atrial fibrillation 01/21/2014  . ICD, biventricular, in situ - Medtronic Protecta 2013, leads 2009 01/21/2014    New Generator Medtronic [place] model number [id number], serial number [id number]  Right atrial lead (chronic) Medtronic, model number [id number], serial [id number] (implanted 05/01/2008)  Right ventricular lead (chronic) Medtronic, model number [id number], serial number [id number] (implanted 05/01/2008)  Left ventricular lead Medtronic, model number [id number], serial number [id number] (implanted 05/01/2008)  Exp   FHx:    Reviewed / unchanged  SHx:    Reviewed / unchanged   Systems Review: Constitutional: Denies fever, chills, wt changes, headaches, insomnia, fatigue, night sweats, change in appetite. Eyes: Denies redness, blurred vision, diplopia, discharge, itchy, watery eyes.  ENT: Denies discharge, congestion, post nasal drip, epistaxis, sore throat, earache, hearing loss, dental pain, tinnitus, vertigo, sinus pain, snoring.  CV: Denies chest pain, palpitations, irregular heartbeat, syncope, dyspnea, diaphoresis, orthopnea, PND, claudication or edema. Respiratory: denies cough, dyspnea, DOE, pleurisy, hoarseness, laryngitis, wheezing.  Gastrointestinal: Denies dysphagia, odynophagia, heartburn, reflux, water brash, abdominal pain or cramps, nausea, vomiting, bloating, diarrhea, constipation, hematemesis, melena, hematochezia  or hemorrhoids. Genitourinary: Denies dysuria, frequency, urgency, nocturia, hesitancy, discharge, hematuria or flank pain. Musculoskeletal: Denies arthralgias, myalgias, stiffness, jt. swelling, pain, limping or strain/sprain.  Skin: Denies pruritus, rash, hives, warts,  acne, eczema or change in skin lesion(s). Neuro: No weakness, tremor, incoordination, spasms, paresthesia or pain. Psychiatric: Denies confusion, memory loss or sensory loss. Endo: Denies change in weight, skin or hair change.  Heme/Lymph: No excessive bleeding, bruising or enlarged lymph nodes.   Exam:  BP 122/74  Pulse 64  Temp 98.1 F   Resp 16  Ht 5' 6.5"   Wt 169 lb 9.6 oz   BMI 26.97 kg/m2  Appears well nourished - in no distress. Eyes: PERRLA, EOMs, conjunctiva no swelling or erythema. Sinuses: No frontal/maxillary tenderness ENT/Mouth: EAC's clear, TM's nl w/o erythema, bulging. Nares clear w/o erythema, swelling, exudates. Oropharynx clear without erythema or exudates. Oral hygiene is  good. Tongue normal, non obstructing. Hearing intact.  Neck: Supple. Thyroid nl. Car 2+/2+ without bruits, nodes or JVD. Chest: Respirations nl with BS clear & equal w/o rales, rhonchi, wheezing or stridor.  Cor: Heart sounds normal w/ regular rate and rhythm without sig. murmurs, gallops, clicks, or rubs. Peripheral pulses normal and equal  without edema.  Abdomen: Soft & bowel sounds normal. Non-tender w/o guarding, rebound, hernias, masses, or organomegaly.  Lymphatics: Unremarkable.  Musculoskeletal: Full ROM all peripheral extremities, joint stability, 5/5 strength, and normal gait.  Skin: Warm, dry without exposed rashes, lesions or ecchymosis apparent.  Neuro: Cranial nerves intact, reflexes equal bilaterally. Sensory-motor testing grossly intact. Tendon reflexes grossly intact.  Pysch: Alert & oriented x 2. Insight and judgement concrete and limited. No ideations.  Assessment and Plan:  1. Hypertension - Continue monitor blood pressure at home. Continue diet/meds same.  2. Hyperlipidemia - Continue diet/meds, exercise,& lifestyle modifications. Continue monitor periodic cholesterol/liver & renal functions   3. T1_IDDM w/Stage 3 CKD - continue recommend prudent low glycemic diet,  weight control, regular exercise, diabetic monitoring and periodic eye exams.  4. Vitamin D Deficiency - Continue supplementation.  Recommended regular exercise, BP monitoring, weight control, and discussed med and SE's. Recommended labs to assess and monitor clinical status. Further disposition pending results of labs.

## 2014-05-20 NOTE — Telephone Encounter (Signed)
Notes Recorded by Kathee Delton, MD on 05/17/2014 at 10:29 AM Please let pt know that his chest ct shows complete clearing of his prior abnormality in the top of the right lung. His enlarged lymph nodes have also decreased in size (likely reactive or related to chf), and his fluid on the lung is much better. Everything has improved, and no further followup is needed. ---  ATC line busy x 4 wcb

## 2014-05-21 LAB — HEMOGLOBIN A1C
Hgb A1c MFr Bld: 7.2 % — ABNORMAL HIGH (ref <5.7)
MEAN PLASMA GLUCOSE: 160 mg/dL — AB (ref <117)

## 2014-05-21 LAB — VITAMIN D 25 HYDROXY (VIT D DEFICIENCY, FRACTURES): Vit D, 25-Hydroxy: 56 ng/mL (ref 30–89)

## 2014-05-21 NOTE — Telephone Encounter (Signed)
Pt advised. Jennifer Castillo, CMA  

## 2014-05-22 ENCOUNTER — Other Ambulatory Visit: Payer: Self-pay | Admitting: Nurse Practitioner

## 2014-06-06 ENCOUNTER — Encounter: Payer: Self-pay | Admitting: Emergency Medicine

## 2014-06-06 ENCOUNTER — Ambulatory Visit (INDEPENDENT_AMBULATORY_CARE_PROVIDER_SITE_OTHER): Payer: Medicare Other | Admitting: Emergency Medicine

## 2014-06-06 VITALS — BP 156/64 | HR 80 | Temp 98.2°F | Resp 16 | Ht 66.5 in | Wt 167.0 lb

## 2014-06-06 DIAGNOSIS — H9193 Unspecified hearing loss, bilateral: Secondary | ICD-10-CM

## 2014-06-06 DIAGNOSIS — R5383 Other fatigue: Secondary | ICD-10-CM

## 2014-06-06 DIAGNOSIS — H9190 Unspecified hearing loss, unspecified ear: Secondary | ICD-10-CM

## 2014-06-06 DIAGNOSIS — R5381 Other malaise: Secondary | ICD-10-CM

## 2014-06-06 NOTE — Progress Notes (Signed)
Subjective:    Patient ID: Troy Warren, male    DOB: Nov 17, 1931, 80 y.o.   MRN: [id number]  HPI Comments: 80 yo WM with decreased hearing since the weekend. He has hx of cerumen impaction in the past.   He was recently in hospital with Cardiomyopathy and CHF with EF 25%. He had recent CT chest with questionable lung mass but unable to further evaluate with patients dementia. His daughter notes it is getting more difficult to take care of him at home with the weekness and increased dementia. She notes her mother is to weak to take care of him alone and they would like to consider hospice. She notes he cannot bath/ dress himself. He has been sleeping more and more easily agitated.      Medication List       This list is accurate as of: 06/06/14  3:37 PM.  Always use your most recent med list.               ALPRAZolam 1 MG tablet  Commonly known as:  XANAX  Take 0.5 mg by mouth at bedtime. Take every night per wife     aspirin 81 MG chewable tablet  Chew 81 mg by mouth daily.     citalopram 40 MG tablet  Commonly known as:  CELEXA  Take 20 mg by mouth. Takes  1/2 of a 40 mg pill to equal 20 mg     diltiazem 120 MG tablet  Commonly known as:  CARDIZEM  Take 1 tablet (120 mg total) by mouth 3 (three) times daily.     diphenoxylate-atropine 2.5-0.025 MG per tablet  Commonly known as:  LOMOTIL  TAKE 1 TABLET FOUR TIMES A DAY AS NEEDED     donepezil 10 MG tablet  Commonly known as:  ARICEPT  TAKE 1 TABLET BY MOUTH EVERY DAY     dorzolamide-timolol 22.3-6.8 MG/ML ophthalmic solution  Commonly known as:  COSOPT  Place 1 drop into both eyes 2 (two) times daily.     ferrous fumarate 325 (106 FE) MG Tabs tablet  Commonly known as:  HEMOCYTE - 106 mg FE  Take 1 tablet by mouth 2 (two) times daily.     furosemide 40 MG tablet  Commonly known as:  LASIX  Take 1 tablet (40 mg total) by mouth daily.     ICAPS Caps  Take 1 capsule by mouth daily.     insulin glargine 100  UNIT/ML injection  Commonly known as:  LANTUS  Inject 0.15 mLs (15 Units total) into the skin daily after breakfast. This was changed since CBGs better controlled during Hospital stay, may titrate up as needed after FU with PCP in 1 week if CBGs trending up at home     isosorbide mononitrate 60 MG 24 hr tablet  Commonly known as:  IMDUR  Take 90 mg by mouth daily. Pt takes 1and 1/2 of the 60 mg pill to equal 90 mg.     labetalol 200 MG tablet  Commonly known as:  NORMODYNE  Take 200 mg by mouth 2 (two) times daily.     levothyroxine 75 MCG tablet  Commonly known as:  SYNTHROID, LEVOTHROID  TAKE 1 TABLET EVERY DAY     losartan 100 MG tablet  Commonly known as:  COZAAR     meloxicam 15 MG tablet  Commonly known as:  MOBIC  Take 15 mg by mouth as needed for pain.     potassium chloride 10  MEQ tablet  Commonly known as:  KLOR-CON 10  TAKE 1 TABLET TWICE DAILY     pravastatin 40 MG tablet  Commonly known as:  PRAVACHOL  Take 40 mg by mouth daily.     VITAMIN B-12 PO  Take 1 tablet by mouth daily.     VITAMIN D PO  Take 4,000 Int'l Units by mouth daily.       Allergies  Allergen Reactions  . Lipitor [Atorvastatin]     weakness   Past Medical History  Diagnosis Date  . Hypertension   . Ischemic heart disease   . Hyperlipemia   . Depression   . Testicular cancer   . Polio     child polio  . Rheumatic fever   . Mild dementia   . Mild dementia   . Systolic heart failure     acute on chronic  . CAD (coronary artery disease)   . Chronic atrial fibrillation   . Vitamin D deficiency   . Type II or unspecified type diabetes mellitus without mention of complication, not stated as uncontrolled   . Other testicular hypofunction   . Permanent atrial fibrillation 01/21/2014  . ICD, biventricular, in situ - Medtronic Protecta 2013, leads 2009 01/21/2014    New Generator Medtronic [place] model number [id number], serial number [id number]  Right atrial lead (chronic) Medtronic,  model number [id number], serial [id number] (implanted 05/01/2008)  Right ventricular lead (chronic) Medtronic, model number [id number], serial number [id number] (implanted 05/01/2008)  Left ventricular lead Medtronic, model number [id number], serial number [id number] (implanted 05/01/2008)  Exp     Review of Systems  Constitutional: Positive for fatigue.  HENT: Positive for ear pain and hearing loss.   Neurological: Positive for weakness.  Psychiatric/Behavioral: Positive for confusion and agitation.  All other systems reviewed and are negative.  BP 156/64  Pulse 80  Temp(Src) 98.2 F (36.8 C) (Temporal)  Resp 16  Ht 5' 6.5" (1.689 m)  Wt 167 lb (75.751 kg)  BMI 26.55 kg/m2     Objective:   Physical Exam  Nursing note and vitals reviewed. Constitutional: He appears well-developed and well-nourished.  HENT:  Head: Normocephalic and atraumatic.  Right Ear: External ear normal.  Left Ear: External ear normal.  Nose: Nose normal.  Mouth/Throat: Oropharynx is clear and moist. No oropharyngeal exudate.  Right canal with fresh blood, left with impaction with cerumen. Unable to hear from either ear.   Eyes: Conjunctivae are normal.  Neck: Normal range of motion.  Cardiovascular: Normal rate, regular rhythm and intact distal pulses.   2/6 murmur  Pulmonary/Chest: Effort normal and breath sounds normal.  Abdominal: Soft.  Musculoskeletal: Normal range of motion.  Lymphadenopathy:    He has no cervical adenopathy.  Neurological: He is alert.  Unable to communicate effectively  Skin: Skin is warm and dry.  Psychiatric:  Easily agitated due to inability to understand conversation          Assessment & Plan:  1. Weakness/ CHF- Ref Hospice evaluation  2. ? Hearing loss due to impaction vs ruptured TM- Ref ENT

## 2014-06-06 NOTE — Patient Instructions (Signed)
Tympanic Membrane Perforation The eardrum (tympanic membrane) protects the inner ear from the outside environment. In addition to protection, the eardrum allows you to hear by transmitting sound waves to the bones in your ear and then to the nervous system. The tympanic membrane is easily perforated, which may result in damage to the inner ear. SYMPTOMS   Sometimes there are no symptoms.  Decreased hearing.  Fluid drainage from ear.  Ear pain. CAUSES   Most commonly, a middle ear infection from built-up pressure.  Injury from a cotton swab.  Traumatic injury to the side of the head. RISK INCREASES WITH:  Frequent middle ear infections.  Use of cotton swabs. PREVENTION   Do not use cotton swabs to clean the ear canal.  If you have ear pain or pressure, see your caregiver to rule out an ear infection that needs treatment. TREATMENT  Protecting the inner ear and allowing the membrane to heal on its own is how tympanic membrane rupture is usually treated. Healing may take several weeks. In order to protect the inner ear, do not allow any fluid to enter the ear canal. Avoid being submerged in water. The use of ear drops may prevent an ear infection from developing, but they should be used with caution, as ear drops can also cause damage to the inner ear. It is important to follow up with your caregiver to confirm healing of the tympanic membrane. If the membrane does not heal, permanent hearing loss may occur. To avoid serious complications, tympanic membranes that do not heal on their own are repaired with surgery. Document Released: 11/29/2005 Document Revised: 02/21/2012 Document Reviewed: 03/13/2009 [employer] Patient Information 2015 [employer]. This information is not intended to replace advice given to you by your health care provider. Make sure you discuss any questions you have with your health care provider.  

## 2014-06-24 DIAGNOSIS — E119 Type 2 diabetes mellitus without complications: Secondary | ICD-10-CM

## 2014-06-24 DIAGNOSIS — R269 Unspecified abnormalities of gait and mobility: Secondary | ICD-10-CM

## 2014-06-24 DIAGNOSIS — Z5189 Encounter for other specified aftercare: Secondary | ICD-10-CM

## 2014-06-24 DIAGNOSIS — I129 Hypertensive chronic kidney disease with stage 1 through stage 4 chronic kidney disease, or unspecified chronic kidney disease: Secondary | ICD-10-CM

## 2014-06-24 DIAGNOSIS — F0390 Unspecified dementia without behavioral disturbance: Secondary | ICD-10-CM

## 2014-06-24 DIAGNOSIS — I5023 Acute on chronic systolic (congestive) heart failure: Secondary | ICD-10-CM

## 2014-06-25 ENCOUNTER — Other Ambulatory Visit: Payer: Self-pay | Admitting: Emergency Medicine

## 2014-06-25 ENCOUNTER — Telehealth: Payer: Self-pay | Admitting: *Deleted

## 2014-06-25 ENCOUNTER — Other Ambulatory Visit: Payer: Self-pay | Admitting: Cardiovascular Disease

## 2014-06-25 NOTE — Telephone Encounter (Signed)
OK for PT, OT, and Home Health per Dr Melford Aase.  Left a message to inform Danielle at [facility].

## 2014-06-26 ENCOUNTER — Other Ambulatory Visit: Payer: Self-pay

## 2014-06-26 MED ORDER — DONEPEZIL HCL 10 MG PO TABS: ORAL_TABLET | ORAL | Status: DC

## 2014-07-01 ENCOUNTER — Telehealth: Payer: Self-pay | Admitting: Nurse Practitioner

## 2014-07-01 NOTE — Telephone Encounter (Signed)
Called pt's daughter Judeth Porch to reschedule pt on 07/04/14, because daughter could only bring pt on a Tuesday or Wednesday. Pt is now scheduled to come in on 07/03/14. I advised the daughter that if the pt has any other problems, questions or concerns to call the office. Daughter verbalized understanding.

## 2014-07-01 NOTE — Telephone Encounter (Signed)
Spouse requesting earlier appointment with Je[facility] Hawking.  He's scheduled for 7/23, but daughter brings to appointment and she only bring him on Tuesday or Wednesday.  Please call and advise

## 2014-07-03 ENCOUNTER — Encounter: Payer: Self-pay | Admitting: Nurse Practitioner

## 2014-07-03 ENCOUNTER — Ambulatory Visit (INDEPENDENT_AMBULATORY_CARE_PROVIDER_SITE_OTHER): Payer: Medicare Other | Admitting: Nurse Practitioner

## 2014-07-03 VITALS — BP 106/57 | HR 66 | Ht 66.0 in | Wt 166.0 lb

## 2014-07-03 DIAGNOSIS — F0390 Unspecified dementia without behavioral disturbance: Secondary | ICD-10-CM | POA: Insufficient documentation

## 2014-07-03 DIAGNOSIS — F03B18 Unspecified dementia, moderate, with other behavioral disturbance: Secondary | ICD-10-CM

## 2014-07-03 DIAGNOSIS — F03918 Unspecified dementia, unspecified severity, with other behavioral disturbance: Secondary | ICD-10-CM

## 2014-07-03 DIAGNOSIS — F0391 Unspecified dementia with behavioral disturbance: Secondary | ICD-10-CM

## 2014-07-03 DIAGNOSIS — F03B0 Unspecified dementia, moderate, without behavioral disturbance, psychotic disturbance, mood disturbance, and anxiety: Secondary | ICD-10-CM | POA: Insufficient documentation

## 2014-07-03 NOTE — Patient Instructions (Addendum)
I think overall you are doing fairly well but I do want to suggest a few things today:  I have given you information about a Senior Care Advisor to help you with placement questions and resources available to Mr. Semper.  Remember to drink plenty of fluid, eat healthy meals and do not skip any meals. Try to eat protein with a every meal and eat a healthy snack such as fruit or nuts in between meals. Try to keep a regular sleep-wake schedule and try to exercise daily, particularly in the form of walking, 20-30 minutes a day, if you can. Good nutrition, proper sleep and exercise can help her cognitive function.  Engage in social activities in your community and with your family and try to keep up with current events by reading the newspaper or watching the news. If you have computer and can go online, try [email or web address]. Also, you may like to do word finding puzzles or crossword puzzles.  As far as your medications are concerned, I would like to suggest to Continue Aricept 10 mg daily.   As far as diagnostic testing: none needed.  We would like to see you back in 6 months, with Dr. Leonie Man, sooner if we need to. Please call us with any interim questions, concerns, problems, updates or refill requests.  Please also call us for any test results so we can go over those with you on the phone. Richardson Landry is my clinical assistant and will answer any of your questions and relay your messages to me and also relay most of my messages to you.  Our phone number is [phone]. We also have an after hours call service for urgent matters and there is a physician on-call for urgent questions. For any emergencies you know to call 911 or go to the nearest emergency room.

## 2014-07-03 NOTE — Progress Notes (Signed)
PATIENT: Troy Warren DOB: December 27, 1930  REASON FOR VISIT: routine follow up for dementia HISTORY FROM: patient  HISTORY OF PRESENT ILLNESS: 80 y.o. Caucasian male with a vascular dementia following left temporal intercerebral hemorrhage in 2007 from warfarin related coagulopathy and atrial fibrillation. Multiple co-morbidities including SDAT, HTN, ASCAD/CABG, T1 IDDM w/CKD (Stage 3), Hypothyroidism, and testicular Cancer.   07/05/2013 He returns for followup today after last visit on 05/15/2012. Is accompanied by his wife who feel that she has noticed worsening of his memory and cognitive abilities. He is cannot remember at times his name several familiar places and needs more help with activities of daily living. Is still able to walk quite well and has good balance and has not had any falls. Does not have any delusions or hallucinations her concerns about agitation her safety. He is tolerating Aricept 10 mg daily without any side effects. He has not yet tried Namenda in the past. Wife state that she is quite comfortable taking care of him at home with some help from her daughter. On Mini-Mental status exam testing today his score 9/30 which is a 5 point decline from 14/30 at last visit one year ago.   UPDATE 09/05/13 (LL): [name] returns with his wife for 2 month follow up after adding Namenda. Patient states he is feeling good, no complaints. Wife and daughter do not see any difference since starting Namenda except they feel he becomes agitated more often and may have more confusion. They would like to stop the medication. MMSE score today is 9/30, which is the same since last visit 2 months ago.   UPDATE 07/03/14 (LL): Patient comes in for dementia checkup accompanied by his daughter form [facility] and his wife.  Since last visit, [name] was hospitalized 4/6-4/92015 with hemoptysis in the setting of Pneumonia vs lung mass, treated with Levaquin - sent home and readmitted  4/10-4/14/2015 with encepatholopathy and CHF and was diuresed.  Follow up chest ct showed complete clearing of his prior abnormality in the top of the right lung. He has become progressively more difficult to take care of at home.  PCP ordered PT, OT, and Home Health, which has not helped. Daughter states he is stubborn like a child, and belligerent at times to his wife. He does not wander. He does not have hallucinations. He wants to sleep most of the day and the night. He needs assistance picking out appropriate clothes but does dress himself.  He is now having trouble toileting, often "missing" the toilet. Wife is too weak to care for him alone, and they cannot afford in-home help.   Functional Assessment:  Activities of Daily Living (ADLs):   He is independent in the following: ambulation, feeding, toileting and dressing Requires assistance with the following: bathing and hygiene, continence and grooming  Instrumental Activities of Daily Living (IADLs):   Patient is independent in the following: none Requires assistance with the following: Ability to Use Telephone, Shopping, Food Preparation, Housekeeping, Medical sales representative, Architectural technologist, Responsibility for Own Medications, Ability to [employer]  ROS:  14 system review of systems is positive for fatigue, daytime sleepiness, blurred vision, shortness of breath, hearing loss, diarrhea, runny nose, memory loss, agitation, confusion, daughter states he mentions wanting to die sometimes, but no suicidal thoughts or actions.   ALLERGIES: Allergies  Allergen Reactions  . Lipitor [Atorvastatin]     weakness   HOME MEDICATIONS: Outpatient Prescriptions Prior to Visit  Medication Sig Dispense Refill  . aspirin 81  MG chewable tablet Chew 81 mg by mouth daily.      . citalopram (CELEXA) 40 MG tablet Take 20 mg by mouth. Takes  1/2 of a 40 mg pill to equal 20 mg      . Cyanocobalamin (VITAMIN B-12 PO) Take 1 tablet by mouth daily.      Marland Kitchen  diltiazem (CARDIZEM) 120 MG tablet Take 1 tablet (120 mg total) by mouth 3 (three) times daily.  270 tablet  3  . donepezil (ARICEPT) 10 MG tablet TAKE 1 TABLET BY MOUTH EVERY DAY  30 tablet  0  . dorzolamide-timolol (COSOPT) 22.3-6.8 MG/ML ophthalmic solution Place 1 drop into both eyes 2 (two) times daily.       . ferrous fumarate (HEMOCYTE - 106 MG FE) 325 (106 FE) MG TABS Take 1 tablet by mouth 2 (two) times daily.      . furosemide (LASIX) 40 MG tablet Take 1 tablet (40 mg total) by mouth daily.  30 tablet  9  . isosorbide mononitrate (IMDUR) 60 MG 24 hr tablet Take 90 mg by mouth daily. Pt takes 1and 1/2 of the 60 mg pill to equal 90 mg.      . labetalol (NORMODYNE) 200 MG tablet Take 200 mg by mouth 2 (two) times daily.      Marland Kitchen levothyroxine (SYNTHROID, LEVOTHROID) 75 MCG tablet TAKE 1 TABLET EVERY DAY  90 tablet  3  . pravastatin (PRAVACHOL) 40 MG tablet Take 40 mg by mouth daily.      Marland Kitchen ALPRAZolam (XANAX) 0.25 MG tablet TAKE 1 TABLET BY MOUTH 3 TIMES A DAY AS NEEDED  90 tablet  0  . insulin glargine (LANTUS) 100 UNIT/ML injection Inject 0.15 mLs (15 Units total) into the skin daily after breakfast. This was changed since CBGs better controlled during Hospital stay, may titrate up as needed after FU with PCP in 1 week if CBGs trending up at home      . ALPRAZolam (XANAX) 1 MG tablet Take 0.5 mg by mouth at bedtime. Take every night per wife      . Cholecalciferol (VITAMIN D PO) Take 4,000 Int'l Units by mouth daily.      . diphenoxylate-atropine (LOMOTIL) 2.5-0.025 MG per tablet TAKE 1 TABLET FOUR TIMES A DAY AS NEEDED  100 tablet  0  . isosorbide mononitrate (IMDUR) 60 MG 24 hr tablet TAKE 1 AND 1/2 TABLETS BY MOUTH DAILY  135 tablet  3  . losartan (COZAAR) 100 MG tablet       . meloxicam (MOBIC) 15 MG tablet Take 15 mg by mouth as needed for pain.       . Multiple Vitamins-Minerals (ICAPS) CAPS Take 1 capsule by mouth daily.      . potassium chloride (KLOR-CON 10) 10 MEQ tablet TAKE 1  TABLET TWICE DAILY  180 tablet  1   No facility-administered medications prior to visit.    PHYSICAL EXAM Filed Vitals:   07/03/14 0911  BP: 106/57  Pulse: 66  Height: 5\' 6"  (1.676 m)  Weight: 166 lb (75.297 kg)   Body mass index is 26.81 kg/(m^2).  MMSE - Mini Mental State Exam 07/03/2014  Orientation to time 0  Orientation to Place 1  Registration 3  Attention/ Calculation 1  Recall 0  Language- name 2 objects 2  Language- repeat 0  Language- follow 3 step command 1  Language- read & follow direction 0  Write a sentence 0  Copy design 0  Total score 8  Physical Exam  General: well developed, well nourished, seated, in no evident distress  Head: head normocephalic and atraumatic. Orohparynx benign  Neck: supple with no carotid or supraclavicular bruits  Cardiovascular: regular rate and rhythm, no murmurs  Musculoskeletal: no deformity  Skin: no rash/petichiae  Vascular: Normal pulses all extremities   Neurologic Exam  Mental Status: Awake and fully alert. Disoriented to place and time. Recent and remote memory diminished. Attention span, concentration and fund of knowledge diminished. Mood and affect pleasant. MMSE 8/30 (down from 9/30 last visit).  Animal Naming test 4 only.  Cranial Nerves: Fundoscopic exam not done Pupils equal, briskly reactive to light. Extraocular movements full without nystagmus. Visual fields full to confrontation. Hearing intact. Facial sensation intact. Face, tongue, palate moves normally and symmetrically.  Motor: Normal bulk and tone. Normal strength in all tested extremity muscles.  Sensory: intact to touch and pinprick and vibratory.  Coordination: Rapid alternating movements normal in all extremities. Finger-to-nose and heel-to-shin performed accurately bilaterally.  Gait and Station: Arises from chair without difficulty. Gait is steady. No apraxia or ataxia. Unable to do tandem walking without difficulty.  Reflexes: 1+ and symmetric.  Toes downgoing.   ASSESSMENT: 80 y.o. male with a vascular dementia following left temporal intercerebral hemorrhage in 2007 from warfarin related coagulopathy and atrial fibrillation. Progression of dementia may be related to superimposed Alzheimer's. No benefit with Namenda and family feels there was increased agitation while taking it. Wife is finding it increasingly difficult to care for him at home. He gets very agitated and belligerent at times with wife, and is reverting to child-like qualities.  PLAN: I had long discussion with patient's wife and daughter about the progression of patient's dementia, behavioral challenges and choices for respite care in [place].  I provided them with the name of a Senior Care Advisor, Epifania Gore, who may help them navigate choices for memory care placement if that is what they decide is best. Continue Aricept 10 mg daily. Return for followup in 3 months with Dr. Leonie Man, sooner as needed.   Rudi Rummage Atari Novick, MSN, FNP-BC, A/GNP-C 07/03/2014, 9:52 PM [facility] [address], [place],  [address] [phone]  Note: This document was prepared with digital dictation and possible smart phrase technology. Any transcriptional errors that result from this process are unintentional.

## 2014-07-04 ENCOUNTER — Ambulatory Visit: Payer: Medicare Other | Admitting: Nurse Practitioner

## 2014-07-05 NOTE — Progress Notes (Signed)
I agree with the above plan 

## 2014-07-18 ENCOUNTER — Other Ambulatory Visit: Payer: Self-pay | Admitting: Internal Medicine

## 2014-07-18 NOTE — Telephone Encounter (Signed)
Noted  

## 2014-07-23 ENCOUNTER — Other Ambulatory Visit: Payer: Self-pay | Admitting: Emergency Medicine

## 2014-07-23 ENCOUNTER — Other Ambulatory Visit: Payer: Self-pay

## 2014-07-23 MED ORDER — DONEPEZIL HCL 10 MG PO TABS: ORAL_TABLET | ORAL | Status: DC

## 2014-08-08 ENCOUNTER — Telehealth: Payer: Self-pay | Admitting: Cardiology

## 2014-08-08 ENCOUNTER — Encounter: Payer: Medicare Other | Admitting: *Deleted

## 2014-08-08 NOTE — Telephone Encounter (Signed)
Spoke with pt and reminded pt of remote transmission that is due today. Pt verbalized understanding.   

## 2014-08-09 ENCOUNTER — Encounter: Payer: Self-pay | Admitting: Cardiology

## 2014-08-23 ENCOUNTER — Other Ambulatory Visit: Payer: Self-pay | Admitting: Internal Medicine

## 2014-08-25 ENCOUNTER — Other Ambulatory Visit: Payer: Self-pay | Admitting: Internal Medicine

## 2014-08-27 ENCOUNTER — Ambulatory Visit (INDEPENDENT_AMBULATORY_CARE_PROVIDER_SITE_OTHER): Payer: Medicare Other | Admitting: Emergency Medicine

## 2014-08-27 ENCOUNTER — Encounter: Payer: Self-pay | Admitting: Emergency Medicine

## 2014-08-27 VITALS — BP 116/58 | HR 78 | Temp 98.0°F | Resp 16 | Ht 66.5 in | Wt 169.0 lb

## 2014-08-27 DIAGNOSIS — I10 Essential (primary) hypertension: Secondary | ICD-10-CM

## 2014-08-27 DIAGNOSIS — R5383 Other fatigue: Secondary | ICD-10-CM

## 2014-08-27 DIAGNOSIS — E119 Type 2 diabetes mellitus without complications: Secondary | ICD-10-CM

## 2014-08-27 DIAGNOSIS — R350 Frequency of micturition: Secondary | ICD-10-CM

## 2014-08-27 DIAGNOSIS — R5381 Other malaise: Secondary | ICD-10-CM

## 2014-08-27 DIAGNOSIS — E782 Mixed hyperlipidemia: Secondary | ICD-10-CM

## 2014-08-27 DIAGNOSIS — Z23 Encounter for immunization: Secondary | ICD-10-CM

## 2014-08-27 LAB — CBC WITH DIFFERENTIAL/PLATELET
Basophils Absolute: 0 10*3/uL (ref 0.0–0.1)
Basophils Relative: 0 % (ref 0–1)
Eosinophils Absolute: 0.2 10*3/uL (ref 0.0–0.7)
Eosinophils Relative: 2 % (ref 0–5)
HCT: 40 % (ref 39.0–52.0)
Hemoglobin: 13.4 g/dL (ref 13.0–17.0)
LYMPHS ABS: 0.9 10*3/uL (ref 0.7–4.0)
Lymphocytes Relative: 10 % — ABNORMAL LOW (ref 12–46)
MCH: 29.4 pg (ref 26.0–34.0)
MCHC: 33.5 g/dL (ref 30.0–36.0)
MCV: 87.7 fL (ref 78.0–100.0)
MONOS PCT: 6 % (ref 3–12)
Monocytes Absolute: 0.5 10*3/uL (ref 0.1–1.0)
NEUTROS ABS: 7 10*3/uL (ref 1.7–7.7)
NEUTROS PCT: 82 % — AB (ref 43–77)
PLATELETS: 177 10*3/uL (ref 150–400)
RBC: 4.56 MIL/uL (ref 4.22–5.81)
RDW: 15.2 % (ref 11.5–15.5)
WBC: 8.5 10*3/uL (ref 4.0–10.5)

## 2014-08-27 LAB — BASIC METABOLIC PANEL WITH GFR
BUN: 20 mg/dL (ref 6–23)
CHLORIDE: 101 meq/L (ref 96–112)
CO2: 26 mEq/L (ref 19–32)
CREATININE: 1.1 mg/dL (ref 0.50–1.35)
Calcium: 8.8 mg/dL (ref 8.4–10.5)
GFR, EST NON AFRICAN AMERICAN: 63 mL/min
GFR, Est African American: 73 mL/min
Glucose, Bld: 302 mg/dL — ABNORMAL HIGH (ref 70–99)
POTASSIUM: 3.7 meq/L (ref 3.5–5.3)
SODIUM: 138 meq/L (ref 135–145)

## 2014-08-27 LAB — LIPID PANEL
Cholesterol: 126 mg/dL (ref 0–200)
HDL: 50 mg/dL (ref >39)
LDL CALC: 59 mg/dL (ref 0–99)
TRIGLYCERIDES: 87 mg/dL (ref <150)
Total CHOL/HDL Ratio: 2.5 Ratio
VLDL: 17 mg/dL (ref 0–40)

## 2014-08-27 LAB — HEPATIC FUNCTION PANEL
ALT: <8 U/L (ref 0–53)
AST: 12 U/L (ref 0–37)
Albumin: 3.8 g/dL (ref 3.5–5.2)
Alkaline Phosphatase: 80 U/L (ref 39–117)
BILIRUBIN INDIRECT: 0.9 mg/dL (ref 0.2–1.2)
Bilirubin, Direct: 0.3 mg/dL (ref 0.0–0.3)
TOTAL PROTEIN: 6.1 g/dL (ref 6.0–8.3)
Total Bilirubin: 1.2 mg/dL (ref 0.2–1.2)

## 2014-08-27 LAB — TSH: TSH: 1.064 u[IU]/mL (ref 0.350–4.500)

## 2014-08-27 NOTE — Patient Instructions (Signed)
Influenza Vaccine (Flu Vaccine, Inactivated or Recombinant) 2014-2015: What You Need to Know 1. Why get vaccinated? Influenza ("flu") is a contagious disease that spreads around the [place] every winter, usually between October and May. Flu is caused by influenza viruses, and is spread mainly by coughing, sneezing, and close contact. Anyone can get flu, but the risk of getting flu is highest among children. Symptoms come on suddenly and may last several days. They can include:  fever/chills  sore throat  muscle aches  fatigue  cough  headache  runny or stuffy nose Flu can make some people much sicker than others. These people include young children, people 65 and older, pregnant women, and people with certain health conditions-such as heart, lung or kidney disease, nervous system disorders, or a weakened immune system. Flu vaccination is especially important for these people, and anyone in close contact with them. Flu can also lead to pneumonia, and make existing medical conditions worse. It can cause diarrhea and seizures in children. Each year thousands of people in the [place] die from flu, and many more are hospitalized. Flu vaccine is the best protection against flu and its complications. Flu vaccine also helps prevent spreading flu from person to person. 2. Inactivated and recombinant flu vaccines You are getting an injectable flu vaccine, which is either an "inactivated" or "recombinant" vaccine. These vaccines do not contain any live influenza virus. They are given by injection with a needle, and often called the "flu shot."  A different live, attenuated (weakened) influenza vaccine is sprayed into the nostrils. This vaccine is described in a separate Vaccine Information Statement. Flu vaccination is recommended every year. Some children 6 months through 8 years of age might need two doses during one year. Flu viruses are always changing. Each year's flu vaccine is made  to protect against 3 or 4 viruses that are likely to cause disease that year. Flu vaccine cannot prevent all cases of flu, but it is the best defense against the disease.  It takes about 2 weeks for protection to develop after the vaccination, and protection lasts several months to a year. Some illnesses that are not caused by influenza virus are often mistaken for flu. Flu vaccine will not prevent these illnesses. It can only prevent influenza. Some inactivated flu vaccine contains a very small amount of a mercury-based preservative called thimerosal. Studies have shown that thimerosal in vaccines is not harmful, but flu vaccines that do not contain a preservative are available. 3. Some people should not get this vaccine Tell the person who gives you the vaccine:  If you have any severe, life-threatening allergies. If you ever had a life-threatening allergic reaction after a dose of flu vaccine, or have a severe allergy to any part of this vaccine, including (for example) an allergy to gelatin, antibiotics, or eggs, you may be advised not to get vaccinated. Most, but not all, types of flu vaccine contain a small amount of egg protein.  If you ever had Guillain-Barr Syndrome (a severe paralyzing illness, also called GBS). Some people with a history of GBS should not get this vaccine. This should be discussed with your doctor.  If you are not feeling well. It is usually okay to get flu vaccine when you have a mild illness, but you might be advised to wait until you feel better. You should come back when you are better. 4. Risks of a vaccine reaction With a vaccine, like any medicine, there is a chance of side   effects. These are usually mild and go away on their own. Problems that could happen after any vaccine:  Brief fainting spells can happen after any medical procedure, including vaccination. Sitting or lying down for about 15 minutes can help prevent fainting, and injuries caused by a fall. Tell  your doctor if you feel dizzy, or have vision changes or ringing in the ears.  Severe shoulder pain and reduced range of motion in the arm where a shot was given can happen, very rarely, after a vaccination.  Severe allergic reactions from a vaccine are very rare, estimated at less than 1 in a million doses. If one were to occur, it would usually be within a few minutes to a few hours after the vaccination. Mild problems following inactivated flu vaccine:  soreness, redness, or swelling where the shot was given  hoarseness  sore, red or itchy eyes  cough  fever  aches  headache  itching  fatigue If these problems occur, they usually begin soon after the shot and last 1 or 2 days. Moderate problems following inactivated flu vaccine:  Young children who get inactivated flu vaccine and pneumococcal vaccine (PCV13) at the same time may be at increased risk for seizures caused by fever. Ask your doctor for more information. Tell your doctor if a child who is getting flu vaccine has ever had a seizure. Inactivated flu vaccine does not contain live flu virus, so you cannot get the flu from this vaccine. As with any medicine, there is a very remote chance of a vaccine causing a serious injury or death. The safety of vaccines is always being monitored. For more information, visit: www.cdc.gov/vaccinesafety/ 5. What if there is a serious reaction? What should I look for?  Look for anything that concerns you, such as signs of a severe allergic reaction, very high fever, or behavior changes. Signs of a severe allergic reaction can include hives, swelling of the face and throat, difficulty breathing, a fast heartbeat, dizziness, and weakness. These would start a few minutes to a few hours after the vaccination. What should I do?  If you think it is a severe allergic reaction or other emergency that can't wait, call 9-1-1 and get the person to the nearest hospital. Otherwise, call your  doctor.  Afterward, the reaction should be reported to the Vaccine Adverse Event Reporting System (VAERS). Your doctor should file this report, or you can do it yourself through the VAERS web site at www.vaers.hhs.gov, or by calling 1-800-822-7967. VAERS does not give medical advice. 6. The National Vaccine Injury Compensation Program The National Vaccine Injury Compensation Program (VICP) is a federal program that was created to compensate people who may have been injured by certain vaccines. Persons who believe they may have been injured by a vaccine can learn about the program and about filing a claim by calling 1-800-338-2382 or visiting the VICP website at www.hrsa.gov/vaccinecompensation. There is a time limit to file a claim for compensation. 7. How can I learn more?  Ask your health care provider.  Call your local or state health department.  Contact the Centers for Disease Control and Prevention (CDC):  Call 1-800-232-4636 (1-800-CDC-INFO) or  Visit CDC's website at www.cdc.gov/flu CDC Vaccine Information Statement (Interim) Inactivated Influenza Vaccine (07/31/2013) Document Released: 09/23/2006 Document Revised: 04/15/2014 Document Reviewed: 11/16/2013 ExitCare Patient Information 2015 [employer]. This information is not intended to replace advice given to you by your health care provider. Make sure you discuss any questions you have with your health   care provider.  

## 2014-08-27 NOTE — Progress Notes (Signed)
Subjective:    Patient ID: Troy Warren, male    DOB: [birth year], 80 y.o.   MRN: [id number]  HPI Comments: 80 yo WM presents for 3 month F/U for HTN, Cholesterol, DM, D. Deficient. Daughter notes trying to decrease sweets and advises he is eating smaller portions more often with regular food. He is not exercising. He is not checking BP/BS routinely but notes OK when checks. Wife notes his urine has increased frequency and odor x last several days.  He is still combative/ argumentative on/off especially with bathing. She notes aide was helping with the situation in the past. Daughter notes they are moving into independent living soon and patient gets agitated when talks about the change or sees furniture removed. She notes he is more problematic with wife than her.   WBC             8.7   05/20/2014 HGB            13.2   05/20/2014 HCT            39.0   05/20/2014 PLT             164   05/20/2014 GLUCOSE          99   05/20/2014 CHOL            132   05/20/2014 TRIG            129   05/20/2014 HDL              46   05/20/2014 LDLCALC          60   05/20/2014 ALT              <8   05/20/2014 AST               9   05/20/2014 NA              141   05/20/2014 K               4.0   05/20/2014 CL              107   05/20/2014 CREATININE     1.11   05/20/2014 BUN              15   05/20/2014 CO2              28   05/20/2014 TSH           0.740   05/20/2014 INR            1.13   03/24/2014 HGBA1C          7.2   05/20/2014   Hyperlipidemia  Diabetes     Medication List       This list is accurate as of: 08/27/14 10:01 AM.  Always use your most recent med list.               ALPRAZolam 0.25 MG tablet  Commonly known as:  XANAX  1/2 tab qd     aspirin 81 MG chewable tablet  Chew 81 mg by mouth daily.     citalopram 40 MG tablet  Commonly known as:  CELEXA  TAKE 1 TABLET BY MOUTH EVERY DAY     diltiazem 120 MG tablet  Commonly known as:  CARDIZEM  Take 1 tablet (120 mg total) by mouth 3 (three) times  daily.     donepezil 10  MG tablet  Commonly known as:  ARICEPT  TAKE 1 TABLET BY MOUTH EVERY DAY     dorzolamide-timolol 22.3-6.8 MG/ML ophthalmic solution  Commonly known as:  COSOPT  Place 1 drop into both eyes 2 (two) times daily.     ferrous fumarate 325 (106 FE) MG Tabs tablet  Commonly known as:  HEMOCYTE - 106 mg FE  Take 1 tablet by mouth 2 (two) times daily.     furosemide 40 MG tablet  Commonly known as:  LASIX  Take 1 tablet (40 mg total) by mouth daily.     insulin glargine 100 UNIT/ML injection  Commonly known as:  LANTUS  Inject 25 Units into the skin daily after breakfast. This was changed since CBGs better controlled during Hospital stay, may titrate up as needed after FU with PCP in 1 week if CBGs trending up at home     isosorbide mononitrate 60 MG 24 hr tablet  Commonly known as:  IMDUR  Take 90 mg by mouth daily. Pt takes 1and 1/2 of the 60 mg pill to equal 90 mg.     KLOR-CON M10 10 MEQ tablet  Generic drug:  potassium chloride  TAKE 1 TABLET TWICE DAILY     labetalol 200 MG tablet  Commonly known as:  NORMODYNE  TAKE 1 TABLET TWICE A DAY     levothyroxine 75 MCG tablet  Commonly known as:  SYNTHROID, LEVOTHROID  TAKE 1 TABLET EVERY DAY     pravastatin 40 MG tablet  Commonly known as:  PRAVACHOL  TAKE 1 TABLET EVERY DAY FOR CHOLESTEROL     VITAMIN B-12 PO  Take 1 tablet by mouth daily.     Vitamin D3 2000 UNITS Tabs  Take 2 tablets by mouth daily.       Allergies  Allergen Reactions  . Lipitor [Atorvastatin]     weakness   Past Medical History  Diagnosis Date  . Hypertension   . Ischemic heart disease   . Hyperlipemia   . Depression   . Testicular cancer   . Polio     child polio  . Rheumatic fever   . Mild dementia   . Mild dementia   . Systolic heart failure     acute on chronic  . CAD (coronary artery disease)   . Chronic atrial fibrillation   . Vitamin D deficiency   . Type II or unspecified type diabetes mellitus  without mention of complication, not stated as uncontrolled   . Other testicular hypofunction   . Permanent atrial fibrillation 01/21/2014  . ICD, biventricular, in situ - Medtronic Protecta 2013, leads 2009 01/21/2014    New Generator Medtronic [place] model number [id number], serial number [id number]  Right atrial lead (chronic) Medtronic, model number [id number], serial [id number] (implanted 05/01/2008)  Right ventricular lead (chronic) Medtronic, model number [id number], serial number [id number] (implanted 05/01/2008)  Left ventricular lead Medtronic, model number [id number], serial number [id number] (implanted 05/01/2008)  Exp      Review of Systems  Genitourinary: Positive for frequency.  Psychiatric/Behavioral: Positive for agitation.  All other systems reviewed and are negative.  BP 116/58  Pulse 78  Temp(Src) 98 F (36.7 C) (Temporal)  Resp 16  Ht 5' 6.5" (1.689 m)  Wt 169 lb (76.658 kg)  BMI 26.87 kg/m2     Objective:   Physical Exam  Nursing note and vitals reviewed. Constitutional: He appears well-developed and well-nourished.  HENT:  Head: Normocephalic and atraumatic.  Right Ear: External ear normal.  Left Ear: External ear normal.  Nose: Nose normal.  Eyes: Conjunctivae and EOM are normal.  Neck: Normal range of motion. Neck supple. No JVD present. No thyromegaly present.  Cardiovascular: Normal rate, regular rhythm, normal heart sounds and intact distal pulses.   Pulmonary/Chest: Effort normal and breath sounds normal.  Abdominal: Soft. Bowel sounds are normal. He exhibits no distension. There is no tenderness. There is no rebound.  Musculoskeletal: Normal range of motion. He exhibits no edema and no tenderness.  Lymphadenopathy:    He has no cervical adenopathy.  Neurological: He is alert. Coordination normal.  Skin: Skin is warm and dry.  Psychiatric: His behavior is normal.  Stable for his history. Seems agreeable with discussions          Assessment & Plan:  Need  for prophylactic vaccination and inoculation against influenza - Plan: Flu vaccine HIGH DOSE PF (Fluzone Tri High dose)  Unspecified essential hypertension - Plan: BASIC METABOLIC PANEL WITH GFR, CBC with Differential  Mixed hyperlipidemia - Plan: Hepatic function panel, Lipid panel  Type II or unspecified type diabetes mellitus without mention of complication, not stated as uncontrolled - Plan: Hemoglobin A1c  Frequency of urination - Plan: Urine culture, Urinalysis, Routine w reflex microscopic  Other malaise and fatigue - Plan: TSH  Advised needs to restart home health to assist with bathing and care with increased combative episodes with increase dementia. Needs to increase water and activity levels. Advised to monitor closely for increased symptoms with upcoming move to assisted living. May increase Xanax 0.25 1/2 to BID dosing but advised possible sedation and close monitoring needed.  1.  3 month F/U for HTN, Cholesterol,DM, D. Deficient. Needs healthy diet, cardio QD and obtain healthy weight. Check Labs, Check BP if >130/80 call office, Check BS if >200 call office

## 2014-08-28 LAB — URINALYSIS, MICROSCOPIC ONLY
Bacteria, UA: NONE SEEN
Casts: NONE SEEN
Crystals: NONE SEEN
SQUAMOUS EPITHELIAL / LPF: NONE SEEN

## 2014-08-28 LAB — HEMOGLOBIN A1C
HEMOGLOBIN A1C: 7.8 % — AB (ref <5.7)
MEAN PLASMA GLUCOSE: 177 mg/dL — AB (ref <117)

## 2014-08-28 LAB — URINALYSIS, ROUTINE W REFLEX MICROSCOPIC
Bilirubin Urine: NEGATIVE
HGB URINE DIPSTICK: NEGATIVE
Ketones, ur: NEGATIVE mg/dL
Leukocytes, UA: NEGATIVE
Nitrite: NEGATIVE
PROTEIN: NEGATIVE mg/dL
Specific Gravity, Urine: 1.018 (ref 1.005–1.030)
UROBILINOGEN UA: 0.2 mg/dL (ref 0.0–1.0)
pH: 5 (ref 5.0–8.0)

## 2014-08-29 ENCOUNTER — Other Ambulatory Visit: Payer: Self-pay | Admitting: Emergency Medicine

## 2014-08-29 DIAGNOSIS — F03918 Unspecified dementia, unspecified severity, with other behavioral disturbance: Secondary | ICD-10-CM

## 2014-08-29 DIAGNOSIS — F0391 Unspecified dementia with behavioral disturbance: Secondary | ICD-10-CM

## 2014-08-29 LAB — URINE CULTURE
COLONY COUNT: NO GROWTH
Organism ID, Bacteria: NO GROWTH

## 2014-08-31 DIAGNOSIS — F03918 Unspecified dementia, unspecified severity, with other behavioral disturbance: Secondary | ICD-10-CM

## 2014-08-31 DIAGNOSIS — E119 Type 2 diabetes mellitus without complications: Secondary | ICD-10-CM

## 2014-08-31 DIAGNOSIS — Z79899 Other long term (current) drug therapy: Secondary | ICD-10-CM

## 2014-08-31 DIAGNOSIS — I2510 Atherosclerotic heart disease of native coronary artery without angina pectoris: Secondary | ICD-10-CM

## 2014-08-31 DIAGNOSIS — F0391 Unspecified dementia with behavioral disturbance: Secondary | ICD-10-CM

## 2014-08-31 DIAGNOSIS — I10 Essential (primary) hypertension: Secondary | ICD-10-CM

## 2014-09-13 ENCOUNTER — Telehealth: Payer: Self-pay | Admitting: *Deleted

## 2014-09-13 NOTE — Telephone Encounter (Signed)
OK for bath aide 2 times a week per Dr Melford Aase.

## 2014-09-17 ENCOUNTER — Encounter: Payer: Self-pay | Admitting: *Deleted

## 2014-09-25 ENCOUNTER — Encounter: Payer: Self-pay | Admitting: Cardiology

## 2014-09-30 ENCOUNTER — Other Ambulatory Visit: Payer: Self-pay | Admitting: Internal Medicine

## 2014-10-07 ENCOUNTER — Encounter: Payer: Self-pay | Admitting: Neurology

## 2014-10-07 ENCOUNTER — Ambulatory Visit (INDEPENDENT_AMBULATORY_CARE_PROVIDER_SITE_OTHER): Payer: Medicare Other | Admitting: Neurology

## 2014-10-07 VITALS — BP 121/75 | HR 74 | Ht 66.5 in | Wt 166.0 lb

## 2014-10-07 DIAGNOSIS — F0280 Dementia in other diseases classified elsewhere without behavioral disturbance: Secondary | ICD-10-CM

## 2014-10-07 DIAGNOSIS — G309 Alzheimer's disease, unspecified: Secondary | ICD-10-CM

## 2014-10-07 MED ORDER — DONEPEZIL HCL 23 MG PO TABS: 23.0000 mg | ORAL_TABLET | Freq: Every day | ORAL | Status: AC

## 2014-10-07 NOTE — Progress Notes (Signed)
PATIENT: Troy Warren DOB: 30-Mar-1931  REASON FOR VISIT: routine follow up for dementia HISTORY FROM: patient  HISTORY OF PRESENT ILLNESS: 80 y.o. Caucasian male with a vascular dementia following left temporal intercerebral hemorrhage in 2007 from warfarin related coagulopathy and atrial fibrillation. Multiple co-morbidities including SDAT, HTN, ASCAD/CABG, T1 IDDM w/CKD (Stage 3), Hypothyroidism, and testicular Cancer.   07/05/2013 He returns for followup today after last visit on 05/15/2012. Is accompanied by his wife who feel that she has noticed worsening of his memory and cognitive abilities. He is cannot remember at times his name several familiar places and needs more help with activities of daily living. Is still able to walk quite well and has good balance and has not had any falls. Does not have any delusions or hallucinations her concerns about agitation her safety. He is tolerating Aricept 10 mg daily without any side effects. He has not yet tried Namenda in the past. Wife state that she is quite comfortable taking care of him at home with some help from her daughter. On Mini-Mental status exam testing today his score 9/30 which is a 5 point decline from 14/30 at last visit one year ago.   UPDATE 09/05/13 (LL): Troy Warren returns with his wife for 2 month follow up after adding Namenda. Patient states he is feeling good, no complaints. Wife and daughter do not see any difference since starting Namenda except they feel he becomes agitated more often and may have more confusion. They would like to stop the medication. MMSE score today is 9/30, which is the same since last visit 2 months ago.   UPDATE 07/03/14 (LL): Patient comes in for dementia checkup accompanied by his daughter form [facility] and his wife.  Since last visit, Troy Warren was hospitalized 4/6-4/92015 with hemoptysis in the setting of Pneumonia vs lung mass, treated with Levaquin - sent home and readmitted  4/10-4/14/2015 with encepatholopathy and CHF and was diuresed.  Follow up chest ct showed complete clearing of his prior abnormality in the top of the right lung. He has become progressively more difficult to take care of at home.  PCP ordered PT, OT, and Home Health, which has not helped. Daughter states he is stubborn like a child, and belligerent at times to his wife. He does not wander. He does not have hallucinations. He wants to sleep most of the day and the night. He needs assistance picking out appropriate clothes but does dress himself.  He is now having trouble toileting, often "missing" the toilet. Wife is too weak to care for him alone, and they cannot afford in-home help.   Functional Assessment:  Activities of Daily Living (ADLs):   He is independent in the following: ambulation, feeding, toileting and dressing Requires assistance with the following: bathing and hygiene, continence and grooming  Instrumental Activities of Daily Living (IADLs):   Patient is independent in the following: none Requires assistance with the following: Ability to Use Telephone, Shopping, Food Preparation, Housekeeping, Laundry, Mode of Transportation, Responsibility for Own Medications, Ability to [place] 10/07/2014 : He returns for followup after last visit 3 months ago. Is accompanied by his wife who states that he is still about the same. He continues to need help with activities of daily living split is still able to do things for himself that he did at last visit. He can bathe himself clean himself and feed himself fairly well occasionally may need some help. He is walking independently with good balance  and has not had any falls. He does get argumentative at times and irritable but there have been no violent behaviors. He does not have delusions, hallucinations. He is starting Aricept 10 mg daily but has never tried a higher dose. He has not been able to tolerate Namenda in the past. On the  Mini-Mental status exam today his 13/30 which is actually improved from 8/30 at last visit. He has been getting help from [facility] who come twice a week to give him a bath. ROS:  14 system review of systems is positive for hepatitis change, weight change, hearing loss, loss of vision, shortness of breath, and diarrhea, back pain, memory loss, weakness, confusion, daytime sleepiness, sleep talking . ALLERGIES: Allergies  Allergen Reactions  . Lipitor [Atorvastatin]     weakness   HOME MEDICATIONS: Outpatient Prescriptions Prior to Visit  Medication Sig Dispense Refill  . ALPRAZolam (XANAX) 0.25 MG tablet 1/2 tab qd      . aspirin 81 MG chewable tablet Chew 81 mg by mouth daily.      . Cholecalciferol (VITAMIN D3) 2000 UNITS TABS Take 2 tablets by mouth daily.      . citalopram (CELEXA) 40 MG tablet TAKE 1 TABLET BY MOUTH EVERY DAY  90 tablet  3  . Cyanocobalamin (VITAMIN B-12 PO) Take 1 tablet by mouth daily.      Marland Kitchen diltiazem (CARDIZEM) 120 MG tablet Take 1 tablet (120 mg total) by mouth 3 (three) times daily.  270 tablet  3  . dorzolamide-timolol (COSOPT) 22.3-6.8 MG/ML ophthalmic solution Place 1 drop into both eyes 2 (two) times daily.       . ferrous fumarate (HEMOCYTE - 106 MG FE) 325 (106 FE) MG TABS Take 1 tablet by mouth 2 (two) times daily.      . furosemide (LASIX) 40 MG tablet Take 1 tablet (40 mg total) by mouth daily.  30 tablet  9  . insulin glargine (LANTUS) 100 UNIT/ML injection Inject 15 Units into the skin daily after breakfast. This was changed since CBGs better controlled during Hospital stay, may titrate up as needed after FU with PCP in 1 week if CBGs trending up at home      . isosorbide mononitrate (IMDUR) 60 MG 24 hr tablet Take 90 mg by mouth daily. Pt takes 1and 1/2 of the 60 mg pill to equal 90 mg.      . KLOR-CON M10 10 MEQ tablet TAKE 1 TABLET TWICE DAILY  180 tablet  1  . labetalol (NORMODYNE) 200 MG tablet TAKE 1 TABLET TWICE A DAY  180 tablet  99  .  levothyroxine (SYNTHROID, LEVOTHROID) 75 MCG tablet TAKE 1 TABLET EVERY DAY  90 tablet  3  . pravastatin (PRAVACHOL) 40 MG tablet TAKE 1 TABLET EVERY DAY FOR CHOLESTEROL  90 tablet  3  . donepezil (ARICEPT) 10 MG tablet TAKE 1 TABLET BY MOUTH EVERY DAY  30 tablet  3  . levothyroxine (SYNTHROID, LEVOTHROID) 75 MCG tablet TAKE 1 TABLET EVERY DAY  90 tablet  3   No facility-administered medications prior to visit.    PHYSICAL EXAM Filed Vitals:   10/07/14 1037  BP: 121/75  Pulse: 74  Height: 5' 6.5" (1.689 m)  Weight: 166 lb (75.297 kg)   Body mass index is 26.39 kg/(m^2).  MMSE - Mini Mental State Exam 10/07/2014 07/03/2014  Orientation to time 0 0  Orientation to Place 4 1  Registration 3 3  Attention/ Calculation 0 1  Recall  0 0  Language- name 2 objects 2 2  Language- repeat 0 0  Language- follow 3 step command 3 1  Language- read & follow direction 1 0  Write a sentence 0 0  Copy design 0 0  Total score 13 8    Physical Exam  General: well developed, well nourished elderly Caucasian male, seated, in no evident distress  Head: head normocephalic and atraumatic.  Neck: supple with no carotid or supraclavicular bruits  Cardiovascular: regular rate and rhythm, no murmurs  Musculoskeletal: no deformity  Skin: no rash/petichiae  Vascular: Normal pulses all extremities   Neurologic Exam  Mental Status: Awake and fully alert. Disoriented to place and time. Recent and remote memory diminished. Attention span, concentration and fund of knowledge diminished. Mood and affect pleasant. MMSE 13/30 (upfrom 8/30 last visit).    Cranial Nerves: Fundoscopic exam not done Pupils equal, briskly reactive to light. Extraocular movements full without nystagmus. Visual fields full to confrontation. Hearing intact. Facial sensation intact. Face, tongue, palate moves normally and symmetrically.  Motor: Normal bulk and tone. Normal strength in all tested extremity muscles.  Sensory: intact to  touch and pinprick and vibratory.  Coordination: Rapid alternating movements normal in all extremities. Finger-to-nose and heel-to-shin performed accurately bilaterally.  Gait and Station: Arises from chair without difficulty. Gait is steady. No apraxia or ataxia. Unable to do tandem walking without difficulty.  Reflexes: 1+ and symmetric. Toes downgoing.   ASSESSMENT: 80 y.o. male with a vascular dementia following left temporal intercerebral hemorrhage in 2007 from warfarin related coagulopathy and atrial fibrillation. Progression of dementia may be related to superimposed Alzheimer's. No benefit with Namenda and family feels there was increased agitation while taking it.   PLAN: I had long discussion with patient's wife  about the progression of patient's dementia, behavioral challenges and choices for respite care in [place]. Try increasing Aricept to 23 mg daily if tolerated. I discussed side effects with the patient and wife and asked him to call me if needed. Discontinue Aricept 10 mg and take 23 mg instead. Return for followup in 3 months with Charlott Holler, NP or call earlier if necessary  Antony Contras, MD  10/07/2014, 11:22 AM [facility] [address] [place],  [address] [phone]  Note: This document was prepared with digital dictation and possible smart phrase technology. Any transcriptional errors that result from this process are unintentional.

## 2014-10-07 NOTE — Patient Instructions (Signed)
I had a long discussion with the patient and his wife regarding his dementia and answered questions. Try increasing Aricept to 23 mg daily if tolerated. I discussed side effects with the patient and wife and asked him to call me if needed. Discontinue Aricept 10 mg and take 23 mg instead. Return for followup in 3 months with Charlott Holler, NP or call earlier if necessary

## 2014-10-16 ENCOUNTER — Ambulatory Visit (INDEPENDENT_AMBULATORY_CARE_PROVIDER_SITE_OTHER): Payer: Medicare Other | Admitting: Internal Medicine

## 2014-10-16 ENCOUNTER — Encounter: Payer: Self-pay | Admitting: Internal Medicine

## 2014-10-16 VITALS — BP 184/86 | HR 96 | Temp 100.0°F | Resp 16 | Ht 66.5 in | Wt 168.0 lb

## 2014-10-16 DIAGNOSIS — J209 Acute bronchitis, unspecified: Secondary | ICD-10-CM

## 2014-10-16 MED ORDER — HYDROCODONE-ACETAMINOPHEN 5-325 MG PO TABS: ORAL_TABLET | ORAL | Status: DC

## 2014-10-16 MED ORDER — AZITHROMYCIN 250 MG PO TABS: ORAL_TABLET | ORAL | Status: DC

## 2014-10-16 MED ORDER — PREDNISONE 20 MG PO TABS: ORAL_TABLET | ORAL | Status: DC

## 2014-10-16 NOTE — Patient Instructions (Signed)
  Recommend Delsym twice daily for cough

## 2014-10-16 NOTE — Progress Notes (Signed)
Subjective:    Patient ID: Troy Warren, male    DOB: 1931/08/16, 80 y.o.   MRN: [id number]  Cough This is a new problem. Episode onset:  in the last 2-3 days. The problem has been waxing and waning. The problem occurs hourly. The cough is non-productive. Associated symptoms include a sore throat. Pertinent negatives include no chest pain, chills, ear congestion, ear pain, fever, headaches, heartburn, hemoptysis, myalgias, nasal congestion, postnasal drip, rash, rhinorrhea, shortness of breath, sweats or wheezing. He has tried nothing for the symptoms.   Medication Sig  . ALPRAZolam (XANAX) 0.25 MG tablet 1/2 tab qd  . aspirin 81 MG chewable tablet Chew 81 mg by mouth daily.  . Cholecalciferol (VITAMIN D3) 2000 UNITS TABS Take 2 tablets by mouth daily.  . citalopram (CELEXA) 40 MG tablet TAKE 1 TABLET BY MOUTH EVERY DAY  . Cyanocobalamin (VITAMIN B-12 PO) Take 1 tablet by mouth daily.  Marland Kitchen diltiazem (CARDIZEM) 120 MG tablet Take 1 tablet (120 mg total) by mouth 3 (three) times daily.  Marland Kitchen donepezil (ARICEPT) 23 MG TABS tablet Take 1 tablet (23 mg total) by mouth at bedtime. (Patient taking differently: Take 23 mg by mouth daily after breakfast. )  . dorzolamide-timolol (COSOPT) 22.3-6.8 MG/ML ophthalmic solution Place 1 drop into both eyes 2 (two) times daily.   . ferrous fumarate (HEMOCYTE - 106 MG FE) 325 (106 FE) MG TABS Take 1 tablet by mouth 2 (two) times daily.  . furosemide (LASIX) 40 MG tablet Take 1 tablet (40 mg total) by mouth daily.  . insulin glargine (LANTUS) 100 UNIT/ML injection Inject 15 Units into the skin daily after breakfast. This was changed since CBGs better controlled during Hospital stay, may titrate up as needed after FU with PCP in 1 week if CBGs trending up at home  . isosorbide mononitrate (IMDUR) 60 MG 24 hr tablet Take 90 mg by mouth daily. Pt takes 1and 1/2 of the 60 mg pill to equal 90 mg.  . KLOR-CON M10 10 MEQ tablet TAKE 1 TABLET TWICE DAILY  . labetalol  (NORMODYNE) 200 MG tablet TAKE 1 TABLET TWICE A DAY  . levothyroxine (SYNTHROID, LEVOTHROID) 75 MCG tablet TAKE 1 TABLET EVERY DAY  . pravastatin (PRAVACHOL) 40 MG tablet TAKE 1 TABLET EVERY DAY FOR CHOLESTEROL   Allergies  Allergen Reactions  . Lipitor [Atorvastatin]     weakness   Past Medical History  Diagnosis Date  . Hypertension   . Ischemic heart disease   . Hyperlipemia   . Depression   . Testicular cancer   . Polio     child polio  . Rheumatic fever   . Mild dementia   . Mild dementia   . Systolic heart failure     acute on chronic  . CAD (coronary artery disease)   . Chronic atrial fibrillation   . Vitamin D deficiency   . Type II or unspecified type diabetes mellitus without mention of complication, not stated as uncontrolled   . Other testicular hypofunction   . Permanent atrial fibrillation 01/21/2014  . ICD, biventricular, in situ - Medtronic Protecta 2013, leads 2009 01/21/2014    New Generator Medtronic Mystic model number [id number], serial number [id number]  Right atrial lead (chronic) Medtronic, model number [id number], serial [id number] (implanted 05/01/2008)  Right ventricular lead (chronic) Medtronic, model number C6970616, serial number [id number] (implanted 05/01/2008)  Left ventricular lead Medtronic, model number [id number], serial number [id number] (implanted 05/01/2008)  Exp   Past Surgical History  Procedure  Laterality Date  . Coronary artery bypass graft  06/26/2002    LIMA to diagonal artery & SVG to the first obtuse margin  . Nm myocar perf wall motion  09/15/2006    Dilated LV w/small area of possible nontransmural inferior scar w/mild perinfarct ischemia.  No significant change from previous study.  . Cardiac defibrillator placement  05/01/2008    Medtronic   Review of Systems  Constitutional: Negative for fever and chills.  HENT: Positive for congestion and sore throat. Negative for ear discharge, ear pain, facial swelling, hearing loss, mouth sores, postnasal  drip, rhinorrhea, sinus pressure, sneezing and trouble swallowing.   Eyes: Negative.   Respiratory: Positive for cough. Negative for hemoptysis, choking, shortness of breath and wheezing.   Cardiovascular: Negative.  Negative for chest pain.  Gastrointestinal: Negative.  Negative for heartburn.  Musculoskeletal: Negative for myalgias.  Skin: Negative for rash.  Neurological: Negative.  Negative for headaches.      Objective:   Physical Exam  Constitutional: No distress.  HENT:  Right Ear: External ear normal.  Left Ear: External ear normal.  Nose: Nose normal.  Mouth/Throat: Oropharynx is clear and moist. No oropharyngeal exudate.  Eyes: EOM are normal. Pupils are equal, round, and reactive to light. Right eye exhibits no discharge. Left eye exhibits no discharge.  Neck: Normal range of motion. Neck supple. No JVD present. No thyromegaly present.  Cardiovascular: Normal rate, regular rhythm and intact distal pulses.   Murmur heard. Pulmonary/Chest: Effort normal. No stridor. No respiratory distress. He has rales.  Abdominal: Soft. Bowel sounds are normal.  Musculoskeletal: Normal range of motion.  Neurological: He is alert. No cranial nerve deficit. Coordination normal.  Skin: Skin is warm and dry. No rash noted. No erythema. No pallor.   Assessment & Plan:   1. Acute bronchitis  - predniSONE (DELTASONE) 20 MG tablet; 1 tab 3 x day for 2 days, then 1 tab 2 x day for 2 days, then 1 tab 1 x day for 3 days  Dispense: 13 tablet with precautions to monitor glucoses.  - azithromycin (ZITHROMAX) 250 MG tablet; Take 2 tablets (500 mg) on  Day 1,  followed by 1 tablet (250 mg) once daily on Days 2 through 5.  Dispense: 6 each; Refill: 1  - HYDROcodone-acetaminophen (NORCO) 5-325 MG per tablet; Take 1/2 to 1 Tablet every 3 to 4 hours only as needed for severe cough  Dispense: 30 tablet; Refill: 0

## 2014-10-24 ENCOUNTER — Emergency Department (HOSPITAL_COMMUNITY): Payer: Medicare Other

## 2014-10-24 ENCOUNTER — Encounter (HOSPITAL_COMMUNITY): Payer: Self-pay | Admitting: Emergency Medicine

## 2014-10-24 ENCOUNTER — Emergency Department (HOSPITAL_COMMUNITY)
Admission: EM | Admit: 2014-10-24 | Discharge: 2014-10-24 | Disposition: A | Discharge status: Home / Self Care | Payer: Medicare Other | Attending: Emergency Medicine | Admitting: Emergency Medicine

## 2014-10-24 DIAGNOSIS — R531 Weakness: Secondary | ICD-10-CM

## 2014-10-24 DIAGNOSIS — Z95810 Presence of automatic (implantable) cardiac defibrillator: Secondary | ICD-10-CM | POA: Insufficient documentation

## 2014-10-24 DIAGNOSIS — I2510 Atherosclerotic heart disease of native coronary artery without angina pectoris: Secondary | ICD-10-CM | POA: Diagnosis not present

## 2014-10-24 DIAGNOSIS — I5023 Acute on chronic systolic (congestive) heart failure: Secondary | ICD-10-CM | POA: Insufficient documentation

## 2014-10-24 DIAGNOSIS — Z794 Long term (current) use of insulin: Secondary | ICD-10-CM | POA: Insufficient documentation

## 2014-10-24 DIAGNOSIS — Z8547 Personal history of malignant neoplasm of testis: Secondary | ICD-10-CM | POA: Diagnosis not present

## 2014-10-24 DIAGNOSIS — E119 Type 2 diabetes mellitus without complications: Secondary | ICD-10-CM | POA: Insufficient documentation

## 2014-10-24 DIAGNOSIS — F0391 Unspecified dementia with behavioral disturbance: Secondary | ICD-10-CM | POA: Insufficient documentation

## 2014-10-24 DIAGNOSIS — I10 Essential (primary) hypertension: Secondary | ICD-10-CM | POA: Insufficient documentation

## 2014-10-24 DIAGNOSIS — R410 Disorientation, unspecified: Secondary | ICD-10-CM

## 2014-10-24 DIAGNOSIS — E559 Vitamin D deficiency, unspecified: Secondary | ICD-10-CM | POA: Diagnosis not present

## 2014-10-24 DIAGNOSIS — Z8612 Personal history of poliomyelitis: Secondary | ICD-10-CM | POA: Insufficient documentation

## 2014-10-24 DIAGNOSIS — I482 Chronic atrial fibrillation: Secondary | ICD-10-CM | POA: Insufficient documentation

## 2014-10-24 DIAGNOSIS — E785 Hyperlipidemia, unspecified: Secondary | ICD-10-CM | POA: Diagnosis not present

## 2014-10-24 DIAGNOSIS — I259 Chronic ischemic heart disease, unspecified: Secondary | ICD-10-CM | POA: Insufficient documentation

## 2014-10-24 DIAGNOSIS — Z951 Presence of aortocoronary bypass graft: Secondary | ICD-10-CM | POA: Diagnosis not present

## 2014-10-24 DIAGNOSIS — Z79899 Other long term (current) drug therapy: Secondary | ICD-10-CM | POA: Diagnosis not present

## 2014-10-24 DIAGNOSIS — R4182 Altered mental status, unspecified: Secondary | ICD-10-CM | POA: Diagnosis present

## 2014-10-24 DIAGNOSIS — Z7982 Long term (current) use of aspirin: Secondary | ICD-10-CM | POA: Insufficient documentation

## 2014-10-24 DIAGNOSIS — Z87891 Personal history of nicotine dependence: Secondary | ICD-10-CM | POA: Diagnosis not present

## 2014-10-24 LAB — URINALYSIS, ROUTINE W REFLEX MICROSCOPIC
Bilirubin Urine: NEGATIVE
Glucose, UA: 100 mg/dL — AB
Ketones, ur: NEGATIVE mg/dL
LEUKOCYTES UA: NEGATIVE
NITRITE: NEGATIVE
PH: 6 (ref 5.0–8.0)
Protein, ur: 30 mg/dL — AB
Specific Gravity, Urine: 1.019 (ref 1.005–1.030)
Urobilinogen, UA: 0.2 mg/dL (ref 0.0–1.0)

## 2014-10-24 LAB — I-STAT VENOUS BLOOD GAS, ED
ACID-BASE EXCESS: 5 mmol/L — AB (ref 0.0–2.0)
BICARBONATE: 30.2 meq/L — AB (ref 20.0–24.0)
O2 SAT: 85 %
PO2 VEN: 50 mmHg — AB (ref 30.0–45.0)
TCO2: 32 mmol/L (ref 0–100)
pCO2, Ven: 46.6 mmHg (ref 45.0–50.0)
pH, Ven: 7.419 — ABNORMAL HIGH (ref 7.250–7.300)

## 2014-10-24 LAB — URINE MICROSCOPIC-ADD ON

## 2014-10-24 LAB — CBC WITH DIFFERENTIAL/PLATELET
BASOS ABS: 0 10*3/uL (ref 0.0–0.1)
BASOS PCT: 0 % (ref 0–1)
Eosinophils Absolute: 0.3 10*3/uL (ref 0.0–0.7)
Eosinophils Relative: 3 % (ref 0–5)
HCT: 42 % (ref 39.0–52.0)
Hemoglobin: 13.6 g/dL (ref 13.0–17.0)
Lymphocytes Relative: 12 % (ref 12–46)
Lymphs Abs: 0.9 10*3/uL (ref 0.7–4.0)
MCH: 28.6 pg (ref 26.0–34.0)
MCHC: 32.4 g/dL (ref 30.0–36.0)
MCV: 88.2 fL (ref 78.0–100.0)
MONOS PCT: 8 % (ref 3–12)
Monocytes Absolute: 0.7 10*3/uL (ref 0.1–1.0)
NEUTROS PCT: 77 % (ref 43–77)
Neutro Abs: 6.3 10*3/uL (ref 1.7–7.7)
Platelets: 140 10*3/uL — ABNORMAL LOW (ref 150–400)
RBC: 4.76 MIL/uL (ref 4.22–5.81)
RDW: 14.7 % (ref 11.5–15.5)
WBC: 8.2 10*3/uL (ref 4.0–10.5)

## 2014-10-24 LAB — COMPREHENSIVE METABOLIC PANEL
ALT: <5 U/L (ref 0–53)
ANION GAP: 12 (ref 5–15)
AST: 10 U/L (ref 0–37)
Albumin: 3.1 g/dL — ABNORMAL LOW (ref 3.5–5.2)
Alkaline Phosphatase: 68 U/L (ref 39–117)
BUN: 15 mg/dL (ref 6–23)
CO2: 27 mEq/L (ref 19–32)
CREATININE: 0.9 mg/dL (ref 0.50–1.35)
Calcium: 8.7 mg/dL (ref 8.4–10.5)
Chloride: 105 mEq/L (ref 96–112)
GFR calc Af Amer: >90 mL/min (ref >90)
GFR calc non Af Amer: 78 mL/min — ABNORMAL LOW (ref >90)
Glucose, Bld: 102 mg/dL — ABNORMAL HIGH (ref 70–99)
Potassium: 3.8 mEq/L (ref 3.7–5.3)
Sodium: 144 mEq/L (ref 137–147)
Total Bilirubin: 0.9 mg/dL (ref 0.3–1.2)
Total Protein: 5.9 g/dL — ABNORMAL LOW (ref 6.0–8.3)

## 2014-10-24 LAB — PRO B NATRIURETIC PEPTIDE: Pro B Natriuretic peptide (BNP): 2991 pg/mL — ABNORMAL HIGH (ref 0–450)

## 2014-10-24 LAB — TROPONIN I

## 2014-10-24 MED ORDER — FUROSEMIDE 20 MG PO TABS
40.0000 mg | ORAL_TABLET | Freq: Every day | ORAL | Status: DC
  Administered 2014-10-24: 40 mg via ORAL
  Filled 2014-10-24: qty 2

## 2014-10-24 MED ORDER — PRAVASTATIN SODIUM 40 MG PO TABS
40.0000 mg | ORAL_TABLET | Freq: Every day | ORAL | Status: DC
  Administered 2014-10-24: 40 mg via ORAL
  Filled 2014-10-24: qty 1

## 2014-10-24 MED ORDER — ISOSORBIDE MONONITRATE ER 60 MG PO TB24
90.0000 mg | ORAL_TABLET | Freq: Every day | ORAL | Status: DC
  Administered 2014-10-24: 90 mg via ORAL
  Filled 2014-10-24: qty 1

## 2014-10-24 MED ORDER — POTASSIUM CHLORIDE CRYS ER 20 MEQ PO TBCR
10.0000 meq | EXTENDED_RELEASE_TABLET | Freq: Two times a day (BID) | ORAL | Status: DC
  Administered 2014-10-24: 10 meq via ORAL

## 2014-10-24 MED ORDER — ASPIRIN EC 81 MG PO TBEC
81.0000 mg | DELAYED_RELEASE_TABLET | Freq: Every day | ORAL | Status: DC
  Administered 2014-10-24: 81 mg via ORAL
  Filled 2014-10-24 (×2): qty 1

## 2014-10-24 MED ORDER — CITALOPRAM HYDROBROMIDE 10 MG PO TABS
20.0000 mg | ORAL_TABLET | Freq: Every day | ORAL | Status: DC
  Administered 2014-10-24: 20 mg via ORAL
  Filled 2014-10-24: qty 2

## 2014-10-24 MED ORDER — DILTIAZEM HCL 60 MG PO TABS
120.0000 mg | ORAL_TABLET | Freq: Three times a day (TID) | ORAL | Status: DC
  Administered 2014-10-24: 120 mg via ORAL
  Filled 2014-10-24 (×3): qty 2

## 2014-10-24 MED ORDER — LABETALOL HCL 200 MG PO TABS
200.0000 mg | ORAL_TABLET | Freq: Two times a day (BID) | ORAL | Status: DC
Start: 2014-10-24 — End: 2014-10-24
  Administered 2014-10-24: 200 mg via ORAL
  Filled 2014-10-24 (×2): qty 1

## 2014-10-24 NOTE — ED Notes (Signed)
Per dr Troy Warren may give daily meds pharm notified

## 2014-10-24 NOTE — Progress Notes (Signed)
CSW spoke with pt and his family re: NHP from home with assistance from [facility].  Further explained that PT/OT evals would be required in order for [facility] to authorize SNF stay and that these could be done from home.  Questions re: applying for Medicaid also addressed.  Confirmed with RNCM that [facility] orders had been submitted.

## 2014-10-24 NOTE — Discharge Instructions (Signed)
Drink plenty of fluids.  Try to eat 3 meals a day.  The home health service will come to your home to evaluate you.  They can help to initiate placement in a skilled nursing facility.   Confusion Confusion is the inability to think with your usual speed or clarity. Confusion may come on quickly or slowly over time. How quickly the confusion comes on depends on the cause. Confusion can be due to any number of causes. CAUSES   Concussion, head injury, or head trauma.  Seizures.  Stroke.  Fever.  Brain tumor.  Age related decreased brain function (dementia).  Heightened emotional states like rage or terror.  Mental illness in which the person loses the ability to determine what is real and what is not (hallucinations).  Infections such as a urinary tract infection (UTI).  Toxic effects from alcohol, drugs, or prescription medicines.  Dehydration and an imbalance of salts in the body (electrolytes).  Lack of sleep.  Low blood sugar (diabetes).  Low levels of oxygen from conditions such as chronic lung disorders.  Drug interactions or other medicine side effects.  Nutritional deficiencies, especially niacin, thiamine, vitamin C, or vitamin B.  Sudden drop in body temperature (hypothermia).  Change in routine, such as when traveling or hospitalized. SIGNS AND SYMPTOMS  People often describe their thinking as cloudy or unclear when they are confused. Confusion can also include feeling disoriented. That means you are unaware of where or who you are. You may also not know what the date or time is. If confused, you may also have difficulty paying attention, remembering, and making decisions. Some people also act aggressively when they are confused.  DIAGNOSIS  The medical evaluation of confusion may include:  Blood and urine tests.  X-rays.  Brain and nervous system tests.  Analyzing your brain waves (electroencephalogram or EEG).  Magnetic resonance imaging (MRI) of  your head.  Computed tomography (CT) scan of your head.  Mental status tests in which your health care provider may ask many questions. Some of these questions may seem silly or strange, but they are a very important test to help diagnose and treat confusion. TREATMENT  An admission to the hospital may not be needed, but a person with confusion should not be left alone. Stay with a family member or friend until the confusion clears. Avoid alcohol, pain relievers, or sedative drugs until you have fully recovered. Do not drive until directed by your health care provider. HOME CARE INSTRUCTIONS  What family and friends can do:  To find out if someone is confused, ask the person to state his or her name, age, and the date. If the person is unsure or answers incorrectly, he or she is confused.  Always introduce yourself, no matter how well the person knows you.  Often remind the person of his or her location.  Place a calendar and clock near the confused person.  Help the person with his or her medicines. You may want to use a pill box, an alarm as a reminder, or give the person each dose as prescribed.  Talk about current events and plans for the day.  Try to keep the environment calm, quiet, and peaceful.  Make sure the person keeps follow-up visits with his or her health care provider. PREVENTION  Ways to prevent confusion:  Avoid alcohol.  Eat a balanced diet.  Get enough sleep.  Take medicine only as directed by your health care provider.  Do not become isolated. Spend  time with other people and make plans for your days.  Keep careful watch on your blood sugar levels if you are diabetic. SEEK IMMEDIATE MEDICAL CARE IF:   You develop severe headaches, repeated vomiting, seizures, blackouts, or slurred speech.  There is increasing confusion, weakness, numbness, restlessness, or personality changes.  You develop a loss of balance, have marked dizziness, feel uncoordinated,  or fall.  You have delusions, hallucinations, or develop severe anxiety.  Your family members think you need to be rechecked. Document Released: 01/06/2005 Document Revised: 04/15/2014 Document Reviewed: 01/04/2014 [facility] Patient Information 2015 [place], [place]. This information is not intended to replace advice given to you by your health care provider. Make sure you discuss any questions you have with your health care provider.  Weakness Weakness is a lack of strength. It may be felt all over the body (generalized) or in one specific part of the body (focal). Some causes of weakness can be serious. You may need further medical evaluation, especially if you are elderly or you have a history of immunosuppression (such as chemotherapy or HIV), kidney disease, heart disease, or diabetes. CAUSES  Weakness can be caused by many different things, including:  Infection.  Physical exhaustion.  Internal bleeding or other blood loss that results in a lack of red blood cells (anemia).  Dehydration. This cause is more common in elderly people.  Side effects or electrolyte abnormalities from medicines, such as pain medicines or sedatives.  Emotional distress, anxiety, or depression.  Circulation problems, especially severe peripheral arterial disease.  Heart disease, such as rapid atrial fibrillation, bradycardia, or heart failure.  Nervous system disorders, such as Guillain-Barr syndrome, multiple sclerosis, or stroke. DIAGNOSIS  To find the cause of your weakness, your caregiver will take your history and perform a physical exam. Lab tests or X-rays may also be ordered, if needed. TREATMENT  Treatment of weakness depends on the cause of your symptoms and can vary greatly. HOME CARE INSTRUCTIONS   Rest as needed.  Eat a well-balanced diet.  Try to get some exercise every day.  Only take over-the-counter or prescription medicines as directed by your caregiver. SEEK MEDICAL CARE IF:    Your weakness seems to be getting worse or spreads to other parts of your body.  You develop new aches or pains. SEEK IMMEDIATE MEDICAL CARE IF:   You cannot perform your normal daily activities, such as getting dressed and feeding yourself.  You cannot walk up and down stairs, or you feel exhausted when you do so.  You have shortness of breath or chest pain.  You have difficulty moving parts of your body.  You have weakness in only one area of the body or on only one side of the body.  You have a fever.  You have trouble speaking or swallowing.  You cannot control your bladder or bowel movements.  You have black or bloody vomit or stools. MAKE SURE YOU:  Understand these instructions.  Will watch your condition.  Will get help right away if you are not doing well or get worse. Document Released: 11/29/2005 Document Revised: 05/30/2012 Document Reviewed: 01/28/2012 [facility] Patient Information 2015 [place], [place]. This information is not intended to replace advice given to you by your health care provider. Make sure you discuss any questions you have with your health care provider.

## 2014-10-24 NOTE — Discharge Planning (Addendum)
Tiaira Arambula J. Clydene Laming, RN, BSN, [employer] [phone] Spoke with pt, spouse and daughter at bedside regarding discharge planning for [place]. Offered pt list of home health agencies to choose from.  Pt chose [facility] to render services.Christa See, RN of [facility] notified.  No DME needs identified at this time.  The pt and spouse currently live at [facility].  Spouse and daughter would prefer SNF placement from ED.  NCM informed them that we would attempt placement, but that they he may have to get placed from [facility].

## 2014-10-24 NOTE — ED Notes (Addendum)
Lives in assisted living and per wife pt was talking out of his head about 130- 2 am and could not get around as well as before last night ,20 left ac, pt has been antibiotics for  And pred( He just finished) for  For cough ,has been weaker daughter states

## 2014-10-24 NOTE — Discharge Planning (Signed)
CARE MANAGEMENT ED NOTE 10/24/2014  Patient:  Troy Warren, Troy Warren   Account Number:  [id number]  Date Initiated:  10/24/2014  Documentation initiated by:  [facility]  Subjective/Objective Assessment:   Cough; This is a new problem. Episode onset:  in the last 2-3 days. The problem has been waxing and waning. The problem occurs hourly. The cough is non-productive. Associated symptoms include a sore throat.     Subjective/Objective Assessment Detail:   Acute bronchitis     Action/Plan:   predniSONE (DELTASONE)  azithromycin (ZITHROMAX)  - HYDROcodone-acetaminophen ([place])   Action/Plan Detail:   Spoke with pt, spouse and daughter at bedside regarding discharge planning for [name]. Offered pt list of home health agencies to choose from.  Pt chose [facility] to render services.Christa See, RN of [facility]   Anticipated DC Date:  10/24/2014     Status Recommendation to Physician:   Result of Recommendation:        Choice offered to / List presented to:            Status of service:    ED Comments:  The pt and spouse currently live at [facility].  Spouse and daughter would prefer SNF placement from ED.  NCM informed them that we would attempt placement, but that they he may have to get placed from [facility].  ED Comments Detail:  CSW states that pt would not be approved for SNF placement from ED due to insurance coverage.  Information relayed to Dr. Eulis Foster and family.  Family would like to speak to [place] notified.

## 2014-10-24 NOTE — ED Notes (Signed)
medtron[employer] pace/ def interrogated

## 2014-10-24 NOTE — ED Notes (Signed)
drv wentz spoke to care mx about pt and wifes ability to care for him

## 2014-10-24 NOTE — ED Provider Notes (Signed)
CSN: [id number]     Arrival date & time 10/24/14  8546 History   First MD Initiated Contact with Patient 10/24/14 [phone]     Chief Complaint  Patient presents with  . Altered Mental Status     (Consider location/radiation/quality/duration/timing/severity/associated sxs/prior Treatment) HPI  Troy Warren is a 80 y.o. male who is here for evaluation of confusion and weakness. Last night.  His wife noticed that he was last active then usual, by not being able to walk to dinner at the assisted living facility.  Later during the night, he went to the bathroom and she noticed that he was "talking out of his head".  He is being treated for a "cold" with antibiotics, and prednisone.  He does not have a history of COPD.  He has not verbalized any complaints to the patient's wife.  He cannot contribute with history.  Level V Caveat- Confusion   Past Medical History  Diagnosis Date  . Hypertension   . Ischemic heart disease   . Hyperlipemia   . Depression   . Testicular cancer   . Polio     child polio  . Rheumatic fever   . Mild dementia   . Mild dementia   . Systolic heart failure     acute on chronic  . CAD (coronary artery disease)   . Chronic atrial fibrillation   . Vitamin D deficiency   . Type II or unspecified type diabetes mellitus without mention of complication, not stated as uncontrolled   . Other testicular hypofunction   . Permanent atrial fibrillation 01/21/2014  . ICD, biventricular, in situ - Medtronic Protecta 2013, leads 2009 01/21/2014    New Generator Medtronic [place] model number [id number], serial number [id number]  Right atrial lead (chronic) Medtronic, model number [id number], serial [id number] (implanted 05/01/2008)  Right ventricular lead (chronic) Medtronic, model number [id number], serial number [id number] (implanted 05/01/2008)  Left ventricular lead Medtronic, model number [id number], serial number [id number] (implanted 05/01/2008)  Exp   Past Surgical History  Procedure  Laterality Date  . Coronary artery bypass graft  06/26/2002    LIMA to diagonal artery & SVG to the first obtuse margin  . Nm myocar perf wall motion  09/15/2006    Dilated LV w/small area of possible nontransmural inferior scar w/mild perinfarct ischemia.  No significant change from previous study.  . Cardiac defibrillator placement  05/01/2008    Medtronic   Family History  Problem Relation Age of Onset  . Other Mother   . Heart attack Father    History  Substance Use Topics  . Smoking status: Former Smoker    Quit date: 12/24/1968  . Smokeless tobacco: Never Used  . Alcohol Use: No    Review of Systems  Unable to perform ROS     Allergies  Lipitor  Home Medications   Prior to Admission medications   Medication Sig Start Date End Date Taking? Authorizing Provider  ALPRAZolam Duanne Moron) 0.25 MG tablet 1/2 tab qd   Yes Vicie Mutters, PA-C  aspirin 81 MG chewable tablet Chew 81 mg by mouth daily.   Yes Historical Provider, MD  Cholecalciferol (VITAMIN D3) 2000 UNITS TABS Take 2 tablets by mouth 2 (two) times daily.    Yes Historical Provider, MD  citalopram (CELEXA) 40 MG tablet TAKE 1 TABLET BY MOUTH EVERY DAY Patient taking differently: 20 mg daily 07/18/14  Yes Vicie Mutters, PA-C  Cyanocobalamin (VITAMIN B-12 PO) Take 1 tablet by mouth daily.   Yes Historical Provider,  MD  dextromethorphan (DELSYM) 30 MG/5ML liquid Take 60 mg by mouth at bedtime as needed for cough.   Yes Historical Provider, MD  donepezil (ARICEPT) 23 MG TABS tablet Take 1 tablet (23 mg total) by mouth at bedtime. Patient taking differently: Take 23 mg by mouth daily after breakfast.  10/07/14  Yes Antony Contras, MD  dorzolamide-timolol (COSOPT) 22.3-6.8 MG/ML ophthalmic solution Place 1 drop into both eyes 2 (two) times daily.  02/15/13  Yes Historical Provider, MD  ferrous fumarate (HEMOCYTE - 106 MG FE) 325 (106 FE) MG TABS Take 1 tablet by mouth 2 (two) times daily.   Yes Historical Provider, MD   furosemide (LASIX) 40 MG tablet Take 1 tablet (40 mg total) by mouth daily. 04/24/14  Yes Mihai Croitoru, MD  insulin glargine (LANTUS) 100 UNIT/ML injection Inject 15 Units into the skin daily after breakfast. This was changed since CBGs better controlled during Hospital stay, may titrate up as needed after FU with PCP in 1 week if CBGs trending up at home 03/21/14  Yes Domenic Polite, MD  isosorbide mononitrate (IMDUR) 60 MG 24 hr tablet Take 90 mg by mouth daily. Pt takes 1and 1/2 of the 60 mg pill to equal 90 mg.   Yes Historical Provider, MD  KLOR-CON M10 10 MEQ tablet TAKE 1 TABLET TWICE DAILY 07/23/14  Yes Unk Pinto, MD  labetalol (NORMODYNE) 200 MG tablet TAKE 1 TABLET TWICE A DAY 08/25/14 08/26/15 Yes Unk Pinto, MD  levothyroxine (SYNTHROID, LEVOTHROID) 75 MCG tablet TAKE 1 TABLET EVERY DAY 10/29/13  Yes Melissa R Smith, PA-C  pravastatin (PRAVACHOL) 40 MG tablet TAKE 1 TABLET EVERY DAY FOR CHOLESTEROL 08/23/14  Yes Unk Pinto, MD  azithromycin (ZITHROMAX) 250 MG tablet Take 2 tablets (500 mg) on  Day 1,  followed by 1 tablet (250 mg) once daily on Days 2 through 5. Patient not taking: Reported on 10/24/2014 10/16/14   Unk Pinto, MD  diltiazem (CARDIZEM) 120 MG tablet Take 1 tablet (120 mg total) by mouth 3 (three) times daily. 01/21/14   Mihai Croitoru, MD  HYDROcodone-acetaminophen (NORCO) 5-325 MG per tablet Take 1/2 to 1 Tablet every 3 to 4 hours only as needed for severe cough Patient not taking: Reported on 10/24/2014 10/16/14   Unk Pinto, MD  predniSONE (DELTASONE) 20 MG tablet 1 tab 3 x day for 2 days, then 1 tab 2 x day for 2 days, then 1 tab 1 x day for 3 days Patient not taking: Reported on 10/24/2014 10/16/14   Unk Pinto, MD   BP 181/100 mmHg  Pulse 64  Temp(Src) 97.5 F (36.4 C) (Oral)  Resp 14  SpO2 98% Physical Exam  Constitutional: He appears well-developed.  Elderly, frail  HENT:  Head: Normocephalic and atraumatic.  Right Ear: External ear  normal.  Left Ear: External ear normal.  Eyes: Conjunctivae and EOM are normal. Pupils are equal, round, and reactive to light.  Neck: Normal range of motion and phonation normal. Neck supple.  Cardiovascular: Normal rate, regular rhythm and normal heart sounds.   Pulmonary/Chest: Effort normal and breath sounds normal. He exhibits no bony tenderness.  Abdominal: Soft. There is no tenderness.  Musculoskeletal: Normal range of motion.  Neurological: He is alert. No cranial nerve deficit or sensory deficit. He exhibits normal muscle tone. Coordination normal.  No dysarthria, or aphasia  Skin: Skin is warm, dry and intact.  Psychiatric: His behavior is normal.  Nursing note and vitals reviewed.   ED Course  Procedures (including critical  care time) Medications - No data to display  Patient Vitals for the past 24 hrs:  BP Temp Temp src Pulse Resp SpO2  10/24/14 0920 181/100 mmHg - - 64 14 98 %  10/24/14 0826 - - - - - 99 %  10/24/14 0818 158/90 mmHg 97.5 F (36.4 C) Oral 66 16 99 %    11:45- he discussed with his cardiologist, who will evaluate him in the office.  He will have the office, call the patient to schedule the appointment.   The patient has been evaluated by social work, and care management, due to the family's request for placement in a skilled nursing facility.  They were unable to place him from the emergency department.  They recommended home health evaluation and assistance.  I have placed a order for that.  I informed the family of the findings and treatment plan.  They reluctantly agree with it.  4:15 PM Reevaluation with update and discussion. After initial assessment and treatment, an updated evaluation reveals no change in clinical status.Daleen Bo L   Labs Review Labs Reviewed  CBC WITH DIFFERENTIAL - Abnormal; Notable for the following:    Platelets 140 (*)    All other components within normal limits  COMPREHENSIVE METABOLIC PANEL - Abnormal; Notable  for the following:    Glucose, Bld 102 (*)    Total Protein 5.9 (*)    Albumin 3.1 (*)    GFR calc non Af Amer 78 (*)    All other components within normal limits  URINALYSIS, ROUTINE W REFLEX MICROSCOPIC - Abnormal; Notable for the following:    Glucose, UA 100 (*)    Hgb urine dipstick MODERATE (*)    Protein, ur 30 (*)    All other components within normal limits  PRO B NATRIURETIC PEPTIDE - Abnormal; Notable for the following:    Pro B Natriuretic peptide (BNP) 2991.0 (*)    All other components within normal limits  URINE MICROSCOPIC-ADD ON - Abnormal; Notable for the following:    Squamous Epithelial / LPF FEW (*)    Bacteria, UA FEW (*)    All other components within normal limits  I-STAT VENOUS BLOOD GAS, ED - Abnormal; Notable for the following:    pH, Ven 7.419 (*)    pO2, Ven 50.0 (*)    Bicarbonate 30.2 (*)    Acid-Base Excess 5.0 (*)    All other components within normal limits  URINE CULTURE  TROPONIN I  BLOOD GAS, VENOUS  CBG MONITORING, ED    Imaging Review Dg Chest Port 1 View  10/24/2014   CLINICAL DATA:  Confusion, cough.  EXAM: PORTABLE CHEST - 1 VIEW  COMPARISON:  03/22/2014  FINDINGS: Unchanged marked cardiomegaly. The patient has undergone CABG. Biventricular ICD/ pacer with 2 right ventricular and right atrial leads. No interval lead displacement. Unchanged aortic contours.  Diffuse interstitial opacity with symmetric, mild hazy airspace disease. Suspect small layering effusions. No pneumothorax.  IMPRESSION: CHF pattern.   Electronically Signed   By: Jorje Guild M.D.   On: 10/24/2014 08:55     EKG Interpretation   Date/Time:  Thursday October 24 2014 08:18:01 EST Ventricular Rate:  93 PR Interval:  191 QRS Duration: 197 QT Interval:  479 QTC Calculation: 596 R Axis:   -153 Text Interpretation:  Atrial fibrillation intermittant V-pacing, and  synchronization pacing on native beats since last tracing no significant  change Confirmed by Eulis Foster   MD, Mickey Esguerra ([id number]) on 10/24/2014 8:59:25 AM  MDM   Final diagnoses:  Confusion  Weakness    Specific confusion and weakness in a debilitated elderly patient.  There is no criteria for admission.  The patient will need placement, eventually, but that can be done from his current home setting. I doubt serious bacterial infection.  Metabolic instability, CVA, impending vascular collapse.   Nursing Notes Reviewed/ Care Coordinated Applicable Imaging Reviewed Interpretation of Laboratory Data incorporated into ED treatment  The patient appears reasonably screened and/or stabilized for discharge and I doubt any other medical condition or other [facility] requiring further screening, evaluation, or treatment in the ED at this time prior to discharge.  Plan: Home Medications- Usual; Home Treatments- rest, fluids, 3 meals each day.; return here if the recommended treatment, does not improve the symptoms; Recommended follow up- PCP follow-up as soon as possible . In home health services, as planned     Richarda Blade, MD 10/24/14 1620

## 2014-10-24 NOTE — ED Notes (Signed)
Up to walk in hallway w/ o2 at 2 [place] and on pulse ox pulse ox is 94-100 %. Dr Eulis Foster spoke to family

## 2014-10-25 ENCOUNTER — Other Ambulatory Visit: Payer: Self-pay | Admitting: *Deleted

## 2014-10-25 LAB — URINE CULTURE

## 2014-10-25 MED ORDER — ALPRAZOLAM 0.25 MG PO TABS: 0.2500 mg | ORAL_TABLET | Freq: Three times a day (TID) | ORAL | Status: DC | PRN

## 2014-10-27 NOTE — Care Management Note (Signed)
10/27/14 08:00 CM received voicemail message  from family of pt stating Troy Warren has not contacted them as of yet.  CM emailed Troy Warren rep, Gerhard Perches to request [facility] (or contact to pt).  Pt was Dcd 11/12 and on AVS [facility] was to render HHPT/RN/SW.  No other CM needs were communicated.  Mariane Masters, BSN, CM [phone].

## 2014-10-28 ENCOUNTER — Encounter: Payer: Self-pay | Admitting: Cardiovascular Disease

## 2014-10-29 ENCOUNTER — Encounter: Payer: Medicare Other | Admitting: Cardiovascular Disease

## 2014-10-29 DIAGNOSIS — E119 Type 2 diabetes mellitus without complications: Secondary | ICD-10-CM

## 2014-10-29 DIAGNOSIS — F0390 Unspecified dementia without behavioral disturbance: Secondary | ICD-10-CM

## 2014-10-29 DIAGNOSIS — I10 Essential (primary) hypertension: Secondary | ICD-10-CM

## 2014-10-29 DIAGNOSIS — I5023 Acute on chronic systolic (congestive) heart failure: Secondary | ICD-10-CM

## 2014-10-30 ENCOUNTER — Telehealth: Payer: Self-pay

## 2014-10-30 NOTE — Telephone Encounter (Signed)
Merry Proud at [facility] called requesting verbal orders for skilled nursing, social worker, PT, and OT. Dr.McKeown OK'd orders. Merry Proud aware

## 2014-11-04 ENCOUNTER — Non-Acute Institutional Stay (SKILLED_NURSING_FACILITY): Payer: Medicare Other | Admitting: Adult Health

## 2014-11-04 DIAGNOSIS — I482 Chronic atrial fibrillation: Secondary | ICD-10-CM

## 2014-11-04 DIAGNOSIS — N058 Unspecified nephritic syndrome with other morphologic changes: Secondary | ICD-10-CM

## 2014-11-04 DIAGNOSIS — F0280 Dementia in other diseases classified elsewhere without behavioral disturbance: Secondary | ICD-10-CM

## 2014-11-04 DIAGNOSIS — E1129 Type 2 diabetes mellitus with other diabetic kidney complication: Secondary | ICD-10-CM

## 2014-11-04 DIAGNOSIS — N183 Chronic kidney disease, stage 3 unspecified: Secondary | ICD-10-CM

## 2014-11-04 DIAGNOSIS — F0150 Vascular dementia without behavioral disturbance: Secondary | ICD-10-CM

## 2014-11-04 DIAGNOSIS — E785 Hyperlipidemia, unspecified: Secondary | ICD-10-CM

## 2014-11-04 DIAGNOSIS — G309 Alzheimer's disease, unspecified: Secondary | ICD-10-CM

## 2014-11-04 DIAGNOSIS — E1122 Type 2 diabetes mellitus with diabetic chronic kidney disease: Secondary | ICD-10-CM

## 2014-11-04 DIAGNOSIS — I129 Hypertensive chronic kidney disease with stage 1 through stage 4 chronic kidney disease, or unspecified chronic kidney disease: Secondary | ICD-10-CM

## 2014-11-04 DIAGNOSIS — F418 Other specified anxiety disorders: Secondary | ICD-10-CM

## 2014-11-04 DIAGNOSIS — E039 Hypothyroidism, unspecified: Secondary | ICD-10-CM

## 2014-11-04 DIAGNOSIS — I4821 Permanent atrial fibrillation: Secondary | ICD-10-CM

## 2014-11-04 DIAGNOSIS — I5023 Acute on chronic systolic (congestive) heart failure: Secondary | ICD-10-CM

## 2014-11-04 DIAGNOSIS — Z95810 Presence of automatic (implantable) cardiac defibrillator: Secondary | ICD-10-CM

## 2014-11-04 LAB — CBC AND DIFFERENTIAL
HCT: 41 % (ref 41–53)
Hemoglobin: 13 g/dL — AB (ref 13.5–17.5)
PLATELETS: 152 10*3/uL (ref 150–399)
WBC: 7.2 10*3/mL

## 2014-11-04 LAB — BASIC METABOLIC PANEL
BUN: 16 mg/dL (ref 4–21)
Creatinine: 10 mg/dL — AB (ref 0.6–1.3)
Glucose: 203 mg/dL
Potassium: 3.6 mmol/L (ref 3.4–5.3)
Sodium: 142 mmol/L (ref 137–147)

## 2014-11-04 LAB — LIPID PANEL
Cholesterol: 116 mg/dL (ref 0–200)
HDL: 49 mg/dL (ref 35–70)
LDL Cholesterol: 54 mg/dL
Triglycerides: 66 mg/dL (ref 40–160)

## 2014-11-04 LAB — HEMOGLOBIN A1C: Hgb A1c MFr Bld: 8 % — AB (ref 4.0–6.0)

## 2014-11-04 LAB — TSH: TSH: 0.85 u[IU]/mL (ref 0.41–5.90)

## 2014-11-05 ENCOUNTER — Encounter: Payer: Self-pay | Admitting: Adult Health

## 2014-11-05 DIAGNOSIS — N183 Chronic kidney disease, stage 3 (moderate): Secondary | ICD-10-CM

## 2014-11-05 DIAGNOSIS — F0150 Vascular dementia without behavioral disturbance: Secondary | ICD-10-CM | POA: Insufficient documentation

## 2014-11-05 DIAGNOSIS — F0280 Dementia in other diseases classified elsewhere without behavioral disturbance: Secondary | ICD-10-CM

## 2014-11-05 DIAGNOSIS — F418 Other specified anxiety disorders: Secondary | ICD-10-CM | POA: Insufficient documentation

## 2014-11-05 DIAGNOSIS — I129 Hypertensive chronic kidney disease with stage 1 through stage 4 chronic kidney disease, or unspecified chronic kidney disease: Secondary | ICD-10-CM

## 2014-11-05 DIAGNOSIS — E1129 Type 2 diabetes mellitus with other diabetic kidney complication: Secondary | ICD-10-CM | POA: Insufficient documentation

## 2014-11-05 DIAGNOSIS — E1122 Type 2 diabetes mellitus with diabetic chronic kidney disease: Secondary | ICD-10-CM | POA: Insufficient documentation

## 2014-11-05 DIAGNOSIS — G309 Alzheimer's disease, unspecified: Secondary | ICD-10-CM

## 2014-11-05 MED ORDER — CITALOPRAM HYDROBROMIDE 40 MG PO TABS: 20.0000 mg | ORAL_TABLET | Freq: Every day | ORAL | Status: DC

## 2014-11-05 NOTE — Progress Notes (Signed)
Patient ID: Troy Warren, male   DOB: 10/21/1931, 80 y.o.   MRN: [id number]  [facility]     Allergies  Allergen Reactions  . Lipitor [Atorvastatin]     weakness       Chief Complaint  Patient presents with  . Acute Visit    follow up transfer     HPI:  He has been transferred here from his home due to increased weakness and his family's inability to care for him at home. He is here for short term rehab. His goal at this time is for long term placement either in skilled nursing or preferably in assisted living as able. He cannot fully participate in the hpi due to his advanced dementia. He is oriented to self only. His family is at bedside today.     Past Medical History  Diagnosis Date  . Hypertension   . Ischemic heart disease   . Hyperlipemia   . Depression   . Testicular cancer   . Polio     child polio  . Rheumatic fever   . Mild dementia   . Mild dementia   . Systolic heart failure     acute on chronic  . CAD (coronary artery disease)   . Chronic atrial fibrillation   . Vitamin D deficiency   . Type II or unspecified type diabetes mellitus without mention of complication, not stated as uncontrolled   . Other testicular hypofunction   . Permanent atrial fibrillation 01/21/2014  . ICD, biventricular, in situ - Medtronic Protecta 2013, leads 2009 01/21/2014    New Generator Medtronic [place] model number [id number], serial number [id number]  Right atrial lead (chronic) Medtronic, model number [id number], serial [id number] (implanted 05/01/2008)  Right ventricular lead (chronic) Medtronic, model number [id number], serial number [id number] (implanted 05/01/2008)  Left ventricular lead Medtronic, model number [id number], serial number [id number] (implanted 05/01/2008)  Exp  . Pleural effusion, right 03/18/2014  . Emphysema 03/18/2014  . Pulmonary infiltrate 05/10/2014    CT chest 03/2014:  RUL interstitial/GGO     Past Surgical History  Procedure Laterality Date  . Coronary artery  bypass graft  06/26/2002    LIMA to diagonal artery & SVG to the first obtuse margin  . Nm myocar perf wall motion  09/15/2006    Dilated LV w/small area of possible nontransmural inferior scar w/mild perinfarct ischemia.  No significant change from previous study.  . Cardiac defibrillator placement  05/01/2008    Medtronic    VITAL SIGNS BP 157/82 mmHg  Pulse 65  Ht 5' 6.5" (1.689 m)  Wt 160 lb 3.2 oz (72.666 kg)  BMI 25.47 kg/m2   Outpatient Encounter Prescriptions as of 11/04/2014  Medication Sig  . ALPRAZolam (XANAX) 0.25 MG tablet Take 0.125 mg by mouth at bedtime.  Marland Kitchen aspirin 81 MG chewable tablet Chew 81 mg by mouth daily.  . Cholecalciferol (VITAMIN D3) 2000 UNITS TABS Take 2 tablets by mouth 2 (two) times daily.   . citalopram (CELEXA) 40 MG tablet TAKE 1 TABLET BY MOUTH EVERY DAY   . Cyanocobalamin (VITAMIN B-12 PO) Take 1 tablet by mouth daily.  Marland Kitchen diltiazem (CARDIZEM) 120 MG tablet Take 1 tablet (120 mg total) by mouth 3 (three) times daily.  Marland Kitchen donepezil (ARICEPT) 23 MG TABS tablet Take 1 tablet (23 mg total) by mouth at bedtime.  . dorzolamide-timolol (COSOPT) 22.3-6.8 MG/ML ophthalmic solution Place 1 drop into both eyes 2 (two) times daily.   . ferrous fumarate (HEMOCYTE - 106 MG  FE) 325 (106 FE) MG TABS Take 1 tablet by mouth 2 (two) times daily.  . furosemide (LASIX) 40 MG tablet Take 1 tablet (40 mg total) by mouth daily.  . insulin glargine (LANTUS) 100 UNIT/ML injection Inject 15 Units into the skin daily   . isosorbide mononitrate (IMDUR) 60 MG 24 hr tablet Take 90 mg by mouth daily. Pt takes 1and 1/2 of the 60 mg pill to equal 90 mg.  . KLOR-CON M10 10 MEQ tablet TAKE 1 TABLET TWICE DAILY  . labetalol (NORMODYNE) 200 MG tablet TAKE 1 TABLET TWICE A DAY  . levothyroxine (SYNTHROID, LEVOTHROID) 75 MCG tablet TAKE 1 TABLET EVERY DAY  . pravastatin (PRAVACHOL) 40 MG tablet TAKE 1 TABLET EVERY DAY FOR CHOLESTEROL  . [DISCONTINUED] azithromycin (ZITHROMAX) 250 MG tablet  Take 2 tablets (500 mg) on  Day 1,  followed by 1 tablet (250 mg) once daily on Days 2 through 5. (Patient not taking: Reported on 10/24/2014)  . [DISCONTINUED] dextromethorphan (DELSYM) 30 MG/5ML liquid Take 60 mg by mouth at bedtime as needed for cough.  . [DISCONTINUED] HYDROcodone-acetaminophen (NORCO) 5-325 MG per tablet Take 1/2 to 1 Tablet every 3 to 4 hours only as needed for severe cough (Patient not taking: Reported on 10/24/2014)  . [DISCONTINUED] predniSONE (DELTASONE) 20 MG tablet 1 tab 3 x day for 2 days, then 1 tab 2 x day for 2 days, then 1 tab 1 x day for 3 days (Patient not taking: Reported on 10/24/2014)     SIGNIFICANT DIAGNOSTIC EXAMS  10-24-14: chest x-ray: chf pattern   LABS REVIEWED:   08-27-14: tsh 1.964; hgb a1c 7.8 10-24-14: wbc 8.2; hgb 13.6; hct 42.0; mcv 88.2; plt 140; glucose 102; bun 15; creat 0.9; k+3.8; na++144;liver normal albumin 3.1; BNP 2991; urine culture: no significant growth      Review of Systems  Constitutional: Negative for malaise/fatigue.  Respiratory: Negative for cough and shortness of breath.   Cardiovascular: Negative for chest pain, palpitations and leg swelling.  Gastrointestinal: Negative for heartburn and constipation.  Musculoskeletal: Negative for myalgias and joint pain.  Skin: Negative.   Psychiatric/Behavioral: The patient is not nervous/anxious.      Physical Exam  Constitutional: He appears well-developed and well-nourished. No distress.  Neck: Neck supple. No JVD present. No thyromegaly present.  Cardiovascular: Normal rate, regular rhythm and intact distal pulses.   Respiratory: Effort normal and breath sounds normal. No respiratory distress. He has no wheezes.  GI: Soft. Bowel sounds are normal. He exhibits no distension. There is no tenderness.  Musculoskeletal: Normal range of motion. He exhibits no edema.  Neurological: He is alert.  To self only   Skin: Skin is warm and dry. He is not diaphoretic.        ASSESSMENT/ PLAN:  1. Afib: his heart rate is stable he is status post pacemaker/icd placement. He is on long term asa 81 mg daily as he is not a candidate for anticoagulation therapy. He is on labetalol 200 mg twice daily for rate control   2. Hypertension: his family states his hypertension is poorly controlled and has been on a long term basis. He does have stage III ckd; however; his bun and creat are stable at this time. Will continue cardizem 120 mg three times daily; imdur 90 mg daily; labetalol 200 mg twice daily; will have nursing staff monitor his blood pressure may need to consider adding an ace or arb in the future if his reading continue to remain high.  3. Chronic systolic heart failure: he is presently stable will continue lasix 40 mg daily with k+ 10 meq twice daily will continue imdur 90 mg daily; labetalol 200 mg twice daily and will monitor his status.   4. Diabetes: his last hgb a1c is 7.8; will continue his lantus 15 units daily and will monitor his status.   5. Dyslipidemia: will continue pravachol 40 mg daily   6. Hypothyroidism: will continue synthroid 75 mcg daily   7. Anemia: stable will continue iron twice daily   8. Mixed vascular and Alzheimer's disease: is without change; he has very poor short term memory. Will continue aricept 23 mg daily and will monitor   9. Depression with anxiety: will lower his celexa to 20 mg daily due to his advanced age; will continue xanax 0.125 mg nightly to help with anxiety and will monitor his status.   10. Glaucoma: will continue costop to both eyes twice daily   11. ckd stage III: without change in status; will not make changes will monitor his renal function   Will check cbc; cmp; lipids hgb a1c; lipids; tsh next draw   Time spent with patient 50 minutes.    Ok Edwards NP [facility]  Contact [phone] Monday through Friday 8am- 5pm  After hours call [phone]

## 2014-11-11 ENCOUNTER — Non-Acute Institutional Stay (SKILLED_NURSING_FACILITY): Payer: Medicare Other | Admitting: Internal Medicine

## 2014-11-11 ENCOUNTER — Encounter: Payer: Self-pay | Admitting: Internal Medicine

## 2014-11-11 ENCOUNTER — Telehealth: Payer: Self-pay | Admitting: Cardiovascular Disease

## 2014-11-11 DIAGNOSIS — F418 Other specified anxiety disorders: Secondary | ICD-10-CM

## 2014-11-11 DIAGNOSIS — I5022 Chronic systolic (congestive) heart failure: Secondary | ICD-10-CM

## 2014-11-11 DIAGNOSIS — F0280 Dementia in other diseases classified elsewhere without behavioral disturbance: Secondary | ICD-10-CM

## 2014-11-11 DIAGNOSIS — N058 Unspecified nephritic syndrome with other morphologic changes: Secondary | ICD-10-CM

## 2014-11-11 DIAGNOSIS — D509 Iron deficiency anemia, unspecified: Secondary | ICD-10-CM

## 2014-11-11 DIAGNOSIS — G309 Alzheimer's disease, unspecified: Secondary | ICD-10-CM

## 2014-11-11 DIAGNOSIS — I10 Essential (primary) hypertension: Secondary | ICD-10-CM

## 2014-11-11 DIAGNOSIS — F0150 Vascular dementia without behavioral disturbance: Secondary | ICD-10-CM

## 2014-11-11 DIAGNOSIS — E1129 Type 2 diabetes mellitus with other diabetic kidney complication: Secondary | ICD-10-CM

## 2014-11-11 DIAGNOSIS — I482 Chronic atrial fibrillation: Secondary | ICD-10-CM

## 2014-11-11 DIAGNOSIS — E039 Hypothyroidism, unspecified: Secondary | ICD-10-CM

## 2014-11-11 DIAGNOSIS — R531 Weakness: Secondary | ICD-10-CM

## 2014-11-11 DIAGNOSIS — I4821 Permanent atrial fibrillation: Secondary | ICD-10-CM

## 2014-11-11 NOTE — Telephone Encounter (Signed)
Troy Warren called in stating that the pt was recently admitted to the nursing home and she wanted to know what the next steps were gonna be with his Cardiology care. Please call  Thanks

## 2014-11-11 NOTE — Progress Notes (Signed)
Patient ID: Troy Warren, male   DOB: 1931-10-27, 80 y.o.   MRN: [id number]    Facility: [facility]   Chief Complaint  Patient presents with  . New Admit To SNF   Allergies  Allergen Reactions  . Lipitor [Atorvastatin]     weakness   Code status- full code  HPI 80 y/o male patient is here for STR. He is here from home with worsening weakness and unable to be taken care at home. He has hx of advanced dementia, CAD, CHF, afib among others. He is oriented to self only. This limits his history taking. No new concern from staff. He denies any concerns  Review of Systems  Constitutional: Negative for fever, chills Respiratory: Negative for cough, shortness of breath Cardiovascular: Negative for chest pain, palpitations Gastrointestinal: Negative for heartburn, nausea, vomiting, abdominal pain Genitourinary: Negative for dysuria.  Skin: Negative for itching and rash.  Neurological: Negative for dizziness, tingling, headaches.  Psychiatric/Behavioral: Negative for depression   Past Medical History  Diagnosis Date  . Hypertension   . Ischemic heart disease   . Hyperlipemia   . Depression   . Testicular cancer   . Polio     child polio  . Rheumatic fever   . Mild dementia   . Mild dementia   . Systolic heart failure     acute on chronic  . CAD (coronary artery disease)   . Chronic atrial fibrillation   . Vitamin D deficiency   . Type II or unspecified type diabetes mellitus without mention of complication, not stated as uncontrolled   . Other testicular hypofunction   . Permanent atrial fibrillation 01/21/2014  . ICD, biventricular, in situ - Medtronic Protecta 2013, leads 2009 01/21/2014    New Generator Medtronic [place] model number [id number], serial number [id number]  Right atrial lead (chronic) Medtronic, model number [id number], serial [id number] (implanted 05/01/2008)  Right ventricular lead (chronic) Medtronic, model number [id number], serial  number [id number] (implanted 05/01/2008)  Left ventricular lead Medtronic, model number [id number], serial number [id number] (implanted 05/01/2008)  Exp  . Pleural effusion, right 03/18/2014  . Emphysema 03/18/2014  . Pulmonary infiltrate 05/10/2014    CT chest 03/2014:  RUL interstitial/GGO    Past Surgical History  Procedure Laterality Date  . Coronary artery bypass graft  06/26/2002    LIMA to diagonal artery & SVG to the first obtuse margin  . Nm myocar perf wall motion  09/15/2006    Dilated LV w/small area of possible nontransmural inferior scar w/mild perinfarct ischemia.  No significant change from previous study.  . Cardiac defibrillator placement  05/01/2008    Medtronic   Current Outpatient Prescriptions on File Prior to Visit  Medication Sig Dispense Refill  . ALPRAZolam (XANAX) 0.25 MG tablet Take 1 tablet (0.25 mg total) by mouth 3 (three) times daily as needed for anxiety. Takes 1/2 to 1 tablet by mouth 3 times daily PRN anxiety or agitation. (Patient taking differently: Take 0.125 mg by mouth at bedtime. ) 90 tablet 1  . aspirin 81 MG chewable tablet Chew 81 mg by mouth daily.    . Cholecalciferol (VITAMIN D3) 2000 UNITS TABS Take 2 tablets by mouth 2 (two) times daily.     . citalopram (CELEXA) 40 MG tablet Take 0.5 tablets (20 mg total) by mouth daily. 90 tablet 3  . Cyanocobalamin (VITAMIN B-12 PO) Take 1 tablet by mouth daily.    Marland Kitchen diltiazem (CARDIZEM) 120 MG tablet Take 1 tablet (120 mg  total) by mouth 3 (three) times daily. 270 tablet 3  . donepezil (ARICEPT) 23 MG TABS tablet Take 1 tablet (23 mg total) by mouth at bedtime. (Patient taking differently: Take 23 mg by mouth daily after breakfast. ) 60 tablet 1  . dorzolamide-timolol (COSOPT) 22.3-6.8 MG/ML ophthalmic solution Place 1 drop into both eyes 2 (two) times daily.     . ferrous fumarate (HEMOCYTE - 106 MG FE) 325 (106 FE) MG TABS Take 1 tablet by mouth 2 (two) times daily.    . furosemide (LASIX) 40 MG tablet Take 1 tablet (40  mg total) by mouth daily. 30 tablet 9  . insulin glargine (LANTUS) 100 UNIT/ML injection Inject 15 Units into the skin daily after breakfast. This was changed since CBGs better controlled during Hospital stay, may titrate up as needed after FU with PCP in 1 week if CBGs trending up at home    . isosorbide mononitrate (IMDUR) 60 MG 24 hr tablet Take 90 mg by mouth daily. Pt takes 1and 1/2 of the 60 mg pill to equal 90 mg.    . KLOR-CON M10 10 MEQ tablet TAKE 1 TABLET TWICE DAILY 180 tablet 1  . labetalol (NORMODYNE) 200 MG tablet TAKE 1 TABLET TWICE A DAY 180 tablet 99  . levothyroxine (SYNTHROID, LEVOTHROID) 75 MCG tablet TAKE 1 TABLET EVERY DAY 90 tablet 3  . pravastatin (PRAVACHOL) 40 MG tablet TAKE 1 TABLET EVERY DAY FOR CHOLESTEROL 90 tablet 3   No current facility-administered medications on file prior to visit.   Family History  Problem Relation Age of Onset  . Other Mother   . Heart attack Father    History   Social History  . Marital Status: Married    Spouse Name: Judeth Porch    Number of Children: 0  . Years of Education: 8th   Occupational History  . retired    Social History Main Topics  . Smoking status: Former Smoker    Quit date: 12/24/1968  . Smokeless tobacco: Never Used  . Alcohol Use: No  . Drug Use: No  . Sexual Activity: No   Other Topics Concern  . Not on file   Social History Narrative   Patient lives at home with spouse.   Patient has 8th grade education   Patient is right handed.   Caffeine Use: Rarely    Physical exam BP 140/67 mmHg  Pulse 60  Temp(Src) 97.4 F (36.3 C)  Resp 18  SpO2 95%  Constitutional: He appears well-developed and well-nourished. No distress.  Neck: Neck supple. No JVD present. No thyromegaly present.  Cardiovascular: Normal rate, regular rhythm and intact distal pulses.   Respiratory: Effort normal and breath sounds normal. No respiratory distress. He has no wheezes.  GI: Soft. Bowel sounds are normal. He exhibits no  distension. There is no tenderness.  Musculoskeletal: Normal range of motion. He exhibits no edema.  Neurological: He is alert.  Skin: Skin is warm and dry. He is not diaphoretic.  Labs 08-27-14: tsh 1.964; hgb a1c 7.8 10-24-14: wbc 8.2; hgb 13.6; hct 42.0; mcv 88.2; plt 140; glucose 102; bun 15; creat 0.9; k+3.8; na++144;liver normal albumin 3.1; BNP 2991; urine culture: no significant growth   Assessment/plan  Generalized weakness Will have him work with physical therapy and occupational therapy team to help with gait training and muscle strengthening exercises.fall precautions. Skin care. Encourage to be out of bed.   Afib status post ICD placement. Continue asa 81 mg daily. Continue cardizem 120 mg  tid and labetalol 200 mg twice daily for rate control. Nursing supervisor has placed call to his cardiology office asking for need of interrogation of device (on request from daughter). Pt currently asymptomatic   Dementia Continue his aricept, continue assistance with ADLs, skin care, fall precautions, monitor po intake  CHF Appears euvolemic on exam. Continue to monitor weight. Continue lasix 40 mg qd, labetalol and imdur 90 mg daily. Continue kcl supplement. Monitor bmp  Hypertension Stable. Continue imdur 90 mg daily with labetalol and cardizem  DM with ckd  Monitor a1c. Last a1c 7.8. Continue lantus and monitor cbg  Hypothyroidism continue synthroid 75 mcg daily   Iron def anemia Monitor cbc, continue iron twice daily   Depression with anxiety continue celexa and xanax     Family/ staff Communication: reviewed care plan with patient and nursing supervisor  Goals of care- STR, possible LTC  Labs- cbc, cmp, a1c, tsh

## 2014-11-11 NOTE — Telephone Encounter (Signed)
Returned call to Santiago Glad with [facility].She wanted Dr.Croitoru to know patient has been admitted there.She scheduled patient appointment with Dr.Croitoru 01/15/15 at 11:45 am.Stated she wanted to know if his ICD needed to be checked before then.Message sent to Dr.Croitoru and [facility].

## 2014-11-11 NOTE — Telephone Encounter (Signed)
We do need to check his ICD, but this can be done remotely since it is a wireless device. He does not need to come to the office, but his home transmitter can be hooked up via the nursing home phone line to do a transmission. Troy Warren may be able to troubleshoot connection problems if this is an issue.

## 2014-11-13 NOTE — Telephone Encounter (Addendum)
Spoke to [place] regarding patient's ICD check. She stated that she was told that the patient did not need to send since he would be checked at his yearly with [facility] in 01-2015. I told her that per his cardiologist [facility]) he needed to send a transmission. She stated that she was unsure of how to do this and if they even had access to the equipment. Spoke to wife who said that the equipment is in the patient's room. Order needs to be signed per Santiago Glad so that check can be performed.

## 2014-11-15 NOTE — Telephone Encounter (Signed)
[facility] transmission received from November. Per [facility]---F/U in 01-2015 will be fine.

## 2014-11-21 ENCOUNTER — Encounter (HOSPITAL_COMMUNITY): Payer: Self-pay | Admitting: Cardiovascular Disease

## 2014-12-09 ENCOUNTER — Encounter: Payer: Self-pay | Admitting: Cardiovascular Disease

## 2014-12-11 ENCOUNTER — Non-Acute Institutional Stay (SKILLED_NURSING_FACILITY): Payer: Medicare Other | Admitting: Registered Nurse

## 2014-12-11 ENCOUNTER — Encounter: Payer: Self-pay | Admitting: Internal Medicine

## 2014-12-11 DIAGNOSIS — E039 Hypothyroidism, unspecified: Secondary | ICD-10-CM | POA: Diagnosis not present

## 2014-12-11 DIAGNOSIS — L858 Other specified epidermal thickening: Secondary | ICD-10-CM | POA: Diagnosis not present

## 2014-12-11 DIAGNOSIS — I482 Chronic atrial fibrillation: Secondary | ICD-10-CM | POA: Diagnosis not present

## 2014-12-11 DIAGNOSIS — I4821 Permanent atrial fibrillation: Secondary | ICD-10-CM

## 2014-12-11 DIAGNOSIS — F418 Other specified anxiety disorders: Secondary | ICD-10-CM

## 2014-12-11 DIAGNOSIS — N058 Unspecified nephritic syndrome with other morphologic changes: Secondary | ICD-10-CM | POA: Diagnosis not present

## 2014-12-11 DIAGNOSIS — D509 Iron deficiency anemia, unspecified: Secondary | ICD-10-CM

## 2014-12-11 DIAGNOSIS — G309 Alzheimer's disease, unspecified: Secondary | ICD-10-CM

## 2014-12-11 DIAGNOSIS — E785 Hyperlipidemia, unspecified: Secondary | ICD-10-CM

## 2014-12-11 DIAGNOSIS — F0280 Dementia in other diseases classified elsewhere without behavioral disturbance: Secondary | ICD-10-CM

## 2014-12-11 DIAGNOSIS — R1310 Dysphagia, unspecified: Secondary | ICD-10-CM

## 2014-12-11 DIAGNOSIS — I5022 Chronic systolic (congestive) heart failure: Secondary | ICD-10-CM

## 2014-12-11 DIAGNOSIS — I10 Essential (primary) hypertension: Secondary | ICD-10-CM

## 2014-12-11 DIAGNOSIS — E1129 Type 2 diabetes mellitus with other diabetic kidney complication: Secondary | ICD-10-CM

## 2014-12-11 DIAGNOSIS — F0150 Vascular dementia without behavioral disturbance: Secondary | ICD-10-CM

## 2014-12-12 ENCOUNTER — Encounter: Payer: Self-pay | Admitting: Registered Nurse

## 2014-12-12 NOTE — Progress Notes (Signed)
Patient ID: Troy Warren, male   DOB: September 14, 1931, 80 y.o.   MRN: [id number]   Place of Service: [facility]  Allergies  Allergen Reactions  . Lipitor [Atorvastatin]     weakness    Code Status: Full Code  Goals of Care: Longevity/STR  Chief Complaint  Patient presents with  . Medical Management of Chronic Issues    afib, htn, chf, dementia, depression w/ anxiety, anemia, dm2    HPI 80 y.o. male with PMH of afib, HTN, depression w/ anxiety, mixed AD and vascular dementia, CHF, DM2 among others is being seen for a routine visit. Weight stable. No change in behaviors or functional status reported. No concerns from staff. Lantus was recently increased from 15 units to 20 units on 12/17, CBG range 106-261. HTN well controlled with cardizem, lasix, and labetolol. CHF stable with lasix, no recent CHF exacerbations. Hypothyroidism stable on levothyroxine. afib is stable on cardizem and labetalol. Seen in room today. Wife at bedside. Patient and wife would like to see his dermatologist, Dr. Allyson Sabal for skin lesions of BLE. Wife is concern about his swallowing as he has been pocketing his food.   Review of Systems Constitutional: Negative for fever and chills HENT: Negative for ear pain and sore throat Eyes: Negative for eye pain and eye discharge  Cardiovascular: Negative for chest pain, palpitations, and leg swelling Respiratory: Negative cough, shortness of breath, and wheezing.  Gastrointestinal: Negative for nausea and vomiting. Negative for abdominal pain, diarrhea and constipation.  Genitourinary: Negative for  Dysuria Musculoskeletal: Negative for back pain, joint pain, and joint swelling  Neurological: Negative for dizziness and headache Skin: Negative for rash and wound.   Psychiatric: Negative for depression  Past Medical History  Diagnosis Date  . Hypertension   . Ischemic heart disease   . Hyperlipemia   . Depression   . Testicular cancer   . Polio    child polio  . Rheumatic fever   . Mild dementia   . Mild dementia   . Systolic heart failure     acute on chronic  . CAD (coronary artery disease)   . Chronic atrial fibrillation   . Vitamin D deficiency   . Type II or unspecified type diabetes mellitus without mention of complication, not stated as uncontrolled   . Other testicular hypofunction   . Permanent atrial fibrillation 01/21/2014  . ICD, biventricular, in situ - Medtronic Protecta 2013, leads 2009 01/21/2014    New Generator Medtronic [place] model number [id number], serial number [id number]  Right atrial lead (chronic) Medtronic, model number [id number], serial [id number] (implanted 05/01/2008)  Right ventricular lead (chronic) Medtronic, model number [id number], serial number [id number] (implanted 05/01/2008)  Left ventricular lead Medtronic, model number [id number], serial number [id number] (implanted 05/01/2008)  Exp  . Pleural effusion, right 03/18/2014  . Emphysema 03/18/2014  . Pulmonary infiltrate 05/10/2014    CT chest 03/2014:  RUL interstitial/GGO     Past Surgical History  Procedure Laterality Date  . Coronary artery bypass graft  06/26/2002    LIMA to diagonal artery & SVG to the first obtuse margin  . Nm myocar perf wall motion  09/15/2006    Dilated LV w/small area of possible nontransmural inferior scar w/mild perinfarct ischemia.  No significant change from previous study.  . Cardiac defibrillator placement  05/01/2008    Medtronic  . Biv icd genertaor change out N/A 10/27/2012    Procedure: BIV ICD GENERTAOR CHANGE OUT;  Surgeon: Sanda Klein, MD;  Location:  [facility];  Service: Cardiovascular;  Laterality: N/A;    History   Social History  . Marital Status: Married    Spouse Name: Judeth Porch    Number of Children: 0  . Years of Education: 8th   Occupational History  . retired    Social History Main Topics  . Smoking status: Former Smoker    Quit date: 12/24/1968  . Smokeless tobacco: Never Used  . Alcohol Use: No  .  Drug Use: No  . Sexual Activity: No   Other Topics Concern  . Not on file   Social History Narrative   Patient lives at home with spouse.   Patient has 8th grade education   Patient is right handed.   Caffeine Use: Rarely    Family History  Problem Relation Age of Onset  . Other Mother   . Heart attack Father       Medication List       This list is accurate as of: 12/11/14 11:59 PM.  Always use your most recent med list.               ALPRAZolam 0.25 MG tablet  Commonly known as:  XANAX  Take 1 tablet (0.25 mg total) by mouth 3 (three) times daily as needed for anxiety. Takes 1/2 to 1 tablet by mouth 3 times daily PRN anxiety or agitation.     aspirin 81 MG chewable tablet  Chew 81 mg by mouth daily.     citalopram 40 MG tablet  Commonly known as:  CELEXA  Take 0.5 tablets (20 mg total) by mouth daily.     diltiazem 120 MG tablet  Commonly known as:  CARDIZEM  Take 1 tablet (120 mg total) by mouth 3 (three) times daily.     donepezil 23 MG Tabs tablet  Commonly known as:  ARICEPT  Take 1 tablet (23 mg total) by mouth at bedtime.     dorzolamide-timolol 22.3-6.8 MG/ML ophthalmic solution  Commonly known as:  COSOPT  Place 1 drop into both eyes 2 (two) times daily.     ferrous fumarate 325 (106 FE) MG Tabs tablet  Commonly known as:  HEMOCYTE - 106 mg FE  Take 1 tablet by mouth 2 (two) times daily.     furosemide 40 MG tablet  Commonly known as:  LASIX  Take 1 tablet (40 mg total) by mouth daily.     insulin glargine 100 UNIT/ML injection  Commonly known as:  LANTUS  Inject 20 Units into the skin at bedtime.     isosorbide mononitrate 60 MG 24 hr tablet  Commonly known as:  IMDUR  Take 90 mg by mouth daily. Pt takes 1and 1/2 of the 60 mg pill to equal 90 mg.     KLOR-CON M10 10 MEQ tablet  Generic drug:  potassium chloride  TAKE 1 TABLET TWICE DAILY     labetalol 200 MG tablet  Commonly known as:  NORMODYNE  TAKE 1 TABLET TWICE A DAY      levothyroxine 75 MCG tablet  Commonly known as:  SYNTHROID, LEVOTHROID  TAKE 1 TABLET EVERY DAY     lisinopril 2.5 MG tablet  Commonly known as:  PRINIVIL,ZESTRIL  Take 2.5 mg by mouth daily.     pravastatin 40 MG tablet  Commonly known as:  PRAVACHOL  TAKE 1 TABLET EVERY DAY FOR CHOLESTEROL     VITAMIN B-12 PO  Take 1 tablet by mouth daily.     Vitamin D3  2000 UNITS Tabs  Take 2 tablets by mouth 2 (two) times daily.        Physical Exam  BP 131/75 mmHg  Pulse 60  Temp(Src) 98.6 F (37 C)  Resp 16  Ht 5\' 7"  (1.702 m)  Wt 162 lb (73.483 kg)  BMI 25.37 kg/m2  SpO2 95%  Constitutional: WDWN elderly male in no acute distress. Conversant and pleasant with confusion HEENT: Normocephalic and atraumatic. PERRL. EOM intact. No icterus. No nasal discharge or sinus tenderness. Oral mucosa moist. Posterior pharynx clear of any exudate or lesions.  Neck: Supple and nontender. No lymphadenopathy, masses, or thyromegaly. No JVD or carotid bruits. Cardiac: Normal S1, S2. RRR without appreciable murmurs, rubs, or gallops. Distal pulses intact. Trace edema of BLE Lungs: No respiratory distress. Breath sounds clear bilaterally without rales, rhonchi, or wheezes. Abdomen: Audible bowel sounds in all quadrants. Soft, nontender, nondistended.  Musculoskeletal: able to move all extremities with generalized weakness. No joint erythema or tenderness. Skin: Warm and dry. No rash noted. Multiple skin lesions on BUE: small cutanenous horn noted on each upper extremity along with multiple other skin lesions, resembling seborrheic keratosis.  Neurological: Alert and oriented to self Psychiatric: Appropriate mood and affect.   Labs Reviewed  CBC Latest Ref Rng 11/04/2014 10/24/2014 08/27/2014  WBC - 7.2 8.2 8.5  Hemoglobin 13.5 - 17.5 g/dL 13.0(A) 13.6 13.4  Hematocrit 41 - 53 % 41 42.0 40.0  Platelets 150 - 399 K/L 152 140(L) 177    CMP Latest Ref Rng 11/04/2014 10/24/2014 08/27/2014  Glucose  70 - 99 mg/dL - 102(H) 302(H)  BUN 4 - 21 mg/dL 16 15 20   Creatinine 0.6 - 1.3 mg/dL 10.0(A) 0.90 1.10  Sodium 137 - 147 mmol/L 142 144 138  Potassium 3.4 - 5.3 mmol/L 3.6 3.8 3.7  Chloride 96 - 112 mEq/L - 105 101  CO2 19 - 32 mEq/L - 27 26  Calcium 8.4 - 10.5 mg/dL - 8.7 8.8  Total Protein 6.0 - 8.3 g/dL - 5.9(L) 6.1  Total Bilirubin 0.3 - 1.2 mg/dL - 0.9 1.2  Alkaline Phos 39 - 117 U/L - 68 80  AST 0 - 37 U/L - 10 12  ALT 0 - 53 U/L - <5 <8    Lab Results  Component Value Date   HGBA1C 8.0* 11/04/2014    Lab Results  Component Value Date   TSH 0.85 11/04/2014    Lipid Panel     Component Value Date/Time   CHOL 116 11/04/2014   TRIG 66 11/04/2014   HDL 49 11/04/2014   CHOLHDL 2.5 08/27/2014 1018   VLDL 17 08/27/2014 1018   LDLCALC 54 11/04/2014   12/17//15: Urine mic/cr: 1020.4  Assessment & Plan 1. DM (diabetes mellitus) type II controlled with renal manifestation Last a1c 8.0. CBG range 106-261. Lantus was recently increased on 12/17 from 15 units to 20 units daily. Continue to monitor CBGs. Urine mic/cr ratio high, will start lisinopril 2.5mg  daily for renal protection. Continue statin. Continue to monitor his status.   2. Hypothyroidism, unspecified hypothyroidism type Stable. Continue levothyroxine 32mcg daily and monitor.   3. Mixed Alzheimer's and vascular dementia Stable. Decline anticipated. Continue aricept 23mg  daily. Fall risk and pressure ulcer precautions in place. Continue to monitor for change in behaviors.  4. Chronic systolic congestive heart failure Weight stable. Euvolemic on exam. Continue lasix 40mg  daily with potassium 66meq twice daily, labetalol 200mg  twice daily, imdur 90mg  daily. Continue daily weight   5. Permanent atrial fibrillation  S/p ICD placement. Rate-controlled. Continue asa 81mg  daily. Continue cardizem 120mg  three times daily, labetalol 200mg  twice daily.   6. Depression with anxiety Stable. Continue celexa 20mg  daily with  xanax 0.25mg  1/2-1 tab three times daily as needed for anxiety. Continue to monitor for change in mood   7. Essential hypertension Stable. Continue labetatol 200mg  twice daily, cardizem 120mg  three times daily, and lasix 40mg  daily with potassium supplement 37meq twice daily. Continue to monitor  8. Anemia, iron deficiency Stable. 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 Will have staff schedule an appointment with his dermatologist, Dr. Allyson Sabal  10. Swallowing difficulty SLP to evaluate patient for dysphagia. Aspiration precautions  11. HLD Stable. Continue pravachol 40mg  daily and monitor.    Family/Staff Communication Plan of care discussed with resident and nursing staff. Resident and nursing staff verbalized understanding and agree with plan of care. No additional questions or concerns reported.    Arthur Holms, MSN, AGNP-C [facility] [address] [place], [place] [address] [phone] [8am-5pm] After hours: [phone]

## 2015-01-08 ENCOUNTER — Non-Acute Institutional Stay (SKILLED_NURSING_FACILITY): Payer: Medicare Other | Admitting: Registered Nurse

## 2015-01-08 ENCOUNTER — Encounter: Payer: Self-pay | Admitting: Registered Nurse

## 2015-01-08 DIAGNOSIS — F418 Other specified anxiety disorders: Secondary | ICD-10-CM

## 2015-01-08 DIAGNOSIS — G309 Alzheimer's disease, unspecified: Secondary | ICD-10-CM

## 2015-01-08 DIAGNOSIS — I482 Chronic atrial fibrillation: Secondary | ICD-10-CM | POA: Diagnosis not present

## 2015-01-08 DIAGNOSIS — I4821 Permanent atrial fibrillation: Secondary | ICD-10-CM

## 2015-01-08 DIAGNOSIS — E1129 Type 2 diabetes mellitus with other diabetic kidney complication: Secondary | ICD-10-CM

## 2015-01-08 DIAGNOSIS — I5022 Chronic systolic (congestive) heart failure: Secondary | ICD-10-CM

## 2015-01-08 DIAGNOSIS — I10 Essential (primary) hypertension: Secondary | ICD-10-CM | POA: Diagnosis not present

## 2015-01-08 DIAGNOSIS — E039 Hypothyroidism, unspecified: Secondary | ICD-10-CM | POA: Diagnosis not present

## 2015-01-08 DIAGNOSIS — E785 Hyperlipidemia, unspecified: Secondary | ICD-10-CM

## 2015-01-08 DIAGNOSIS — H409 Unspecified glaucoma: Secondary | ICD-10-CM | POA: Diagnosis not present

## 2015-01-08 DIAGNOSIS — D509 Iron deficiency anemia, unspecified: Secondary | ICD-10-CM | POA: Diagnosis not present

## 2015-01-08 DIAGNOSIS — N058 Unspecified nephritic syndrome with other morphologic changes: Secondary | ICD-10-CM | POA: Diagnosis not present

## 2015-01-08 DIAGNOSIS — F0280 Dementia in other diseases classified elsewhere without behavioral disturbance: Secondary | ICD-10-CM

## 2015-01-08 DIAGNOSIS — F0150 Vascular dementia without behavioral disturbance: Secondary | ICD-10-CM

## 2015-01-08 DIAGNOSIS — R1310 Dysphagia, unspecified: Secondary | ICD-10-CM | POA: Diagnosis not present

## 2015-01-08 NOTE — Progress Notes (Signed)
Patient ID: Troy Warren, male   DOB: Jan 15, 1931, 81 y.o.   MRN: [id number]   Place of Service: [facility]  Allergies  Allergen Reactions  . Lipitor [Atorvastatin]     weakness    Code Status: Full Code  Goals of Care: Longevity/STR  Chief Complaint  Patient presents with  . Medical Management of Chronic Issues    DM2, HTN, afib, dementia, depression, anemia, HLD, hypothyroidism    HPI 81 y.o. male with PMH of afib, HTN, hypothyroidism, depression w/ anxiety, mixed AD and vascular dementia, CHF, DM2 among others is being seen for a routine visit for management of his chronic issues. Has 5 lbs weight gain since last routine visit. No recent fall or skin concerns reported. No change in behavior or functional status reported. No concerns from staff.  BP elevated in 150s-160s/60-70s. CHF stable on lasix, no recent CHF exacerbations. Hypothyroidism stable on levothyroxine. Afib is stable on cardizem and labetalol. Mood stable on celexa and xanax. Anemia stable on iron supplement. CBG range 77-315 with most reading in 150s-200s. Had one hypoglycemic episode with CBG 30.  Seen in room today. No complaints verbalized by patient.   Review of Systems Constitutional: Negative for fever and chills HENT: Negative for ear pain and sore throat Eyes: Negative for eye pain and eye discharge  Cardiovascular: Negative for chest pain, palpitations, and leg swelling Respiratory: Negative cough, shortness of breath, and wheezing.  Gastrointestinal: Negative for nausea and vomiting. Negative for abdominal pain, diarrhea and constipation.  Genitourinary: Negative for dysuria Musculoskeletal: Negative for back pain, joint pain, and joint swelling  Neurological: Negative for dizziness and headache Skin: Negative for rash and wound.   Psychiatric: Negative for depression  Past Medical History  Diagnosis Date  . Hypertension   . Ischemic heart disease   . Hyperlipemia   . Depression     . Testicular cancer   . Polio     child polio  . Rheumatic fever   . Mild dementia   . Mild dementia   . Systolic heart failure     acute on chronic  . CAD (coronary artery disease)   . Chronic atrial fibrillation   . Vitamin D deficiency   . Type II or unspecified type diabetes mellitus without mention of complication, not stated as uncontrolled   . Other testicular hypofunction   . Permanent atrial fibrillation 01/21/2014  . ICD, biventricular, in situ - Medtronic Protecta 2013, leads 2009 01/21/2014    New Generator Medtronic [place] model number [id number], serial number [id number]  Right atrial lead (chronic) Medtronic, model number [id number], serial [id number] (implanted 05/01/2008)  Right ventricular lead (chronic) Medtronic, model number [id number], serial number [id number] (implanted 05/01/2008)  Left ventricular lead Medtronic, model number [id number], serial number [id number] (implanted 05/01/2008)  Exp  . Pleural effusion, right 03/18/2014  . Emphysema 03/18/2014  . Pulmonary infiltrate 05/10/2014    CT chest 03/2014:  RUL interstitial/GGO     Past Surgical History  Procedure Laterality Date  . Coronary artery bypass graft  06/26/2002    LIMA to diagonal artery & SVG to the first obtuse margin  . Nm myocar perf wall motion  09/15/2006    Dilated LV w/small area of possible nontransmural inferior scar w/mild perinfarct ischemia.  No significant change from previous study.  . Cardiac defibrillator placement  05/01/2008    Medtronic  . Biv icd genertaor change out N/A 10/27/2012    Procedure: BIV ICD GENERTAOR CHANGE OUT;  Surgeon: Dani Gobble  Croitoru, MD;  Location: [facility];  Service: Cardiovascular;  Laterality: N/A;    History   Social History  . Marital Status: Married    Spouse Name: Judeth Porch    Number of Children: 0  . Years of Education: 8th   Occupational History  . retired    Social History Main Topics  . Smoking status: Former Smoker    Quit date: 12/24/1968  . Smokeless  tobacco: Never Used  . Alcohol Use: No  . Drug Use: No  . Sexual Activity: No   Other Topics Concern  . Not on file   Social History Narrative   Patient lives at home with spouse.   Patient has 8th grade education   Patient is right handed.   Caffeine Use: Rarely    Family History  Problem Relation Age of Onset  . Other Mother   . Heart attack Father       Medication List       This list is accurate as of: 01/08/15  8:33 PM.  Always use your most recent med list.               ALPRAZolam 0.25 MG tablet  Commonly known as:  XANAX  Take 1 tablet (0.25 mg total) by mouth 3 (three) times daily as needed for anxiety. Takes 1/2 to 1 tablet by mouth 3 times daily PRN anxiety or agitation.     aspirin 81 MG chewable tablet  Chew 81 mg by mouth daily.     citalopram 40 MG tablet  Commonly known as:  CELEXA  Take 0.5 tablets (20 mg total) by mouth daily.     diltiazem 120 MG tablet  Commonly known as:  CARDIZEM  Take 1 tablet (120 mg total) by mouth 3 (three) times daily.     donepezil 23 MG Tabs tablet  Commonly known as:  ARICEPT  Take 1 tablet (23 mg total) by mouth at bedtime.     dorzolamide-timolol 22.3-6.8 MG/ML ophthalmic solution  Commonly known as:  COSOPT  Place 1 drop into both eyes 2 (two) times daily.     ferrous fumarate 325 (106 FE) MG Tabs tablet  Commonly known as:  HEMOCYTE - 106 mg FE  Take 1 tablet by mouth 2 (two) times daily.     furosemide 40 MG tablet  Commonly known as:  LASIX  Take 1 tablet (40 mg total) by mouth daily.     insulin glargine 100 UNIT/ML injection  Commonly known as:  LANTUS  Inject 20 Units into the skin at bedtime.     isosorbide mononitrate 60 MG 24 hr tablet  Commonly known as:  IMDUR  Take 90 mg by mouth daily. Pt takes 1and 1/2 of the 60 mg pill to equal 90 mg.     KLOR-CON M10 10 MEQ tablet  Generic drug:  potassium chloride  TAKE 1 TABLET TWICE DAILY     labetalol 200 MG tablet  Commonly known as:   NORMODYNE  TAKE 1 TABLET TWICE A DAY     levothyroxine 75 MCG tablet  Commonly known as:  SYNTHROID, LEVOTHROID  TAKE 1 TABLET EVERY DAY     lisinopril 2.5 MG tablet  Commonly known as:  PRINIVIL,ZESTRIL  Take 5 mg by mouth daily.     pravastatin 40 MG tablet  Commonly known as:  PRAVACHOL  TAKE 1 TABLET EVERY DAY FOR CHOLESTEROL     VITAMIN B-12 PO  Take 1 tablet by mouth daily.  Vitamin D3 2000 UNITS Tabs  Take 2 tablets by mouth 2 (two) times daily.        Physical Exam  BP 156/70 mmHg  Pulse 64  Temp(Src) 97.8 F (36.6 C)  Resp 18  Ht 5\' 7"  (1.702 m)  Wt 167 lb (75.751 kg)  BMI 26.15 kg/m2  Constitutional: WDWN elderly male in no acute distress. Conversant and pleasant with confusion HEENT: Normocephalic and atraumatic. PERRL. EOM intact. No icterus. No nasal discharge or sinus tenderness. Oral mucosa moist. Posterior pharynx clear of any exudate or lesions.  Neck: Supple and nontender. No lymphadenopathy, masses, or thyromegaly. No JVD or carotid bruits. Cardiac: Normal S1, S2. RRR without appreciable murmurs, rubs, or gallops. Distal pulses intact. Trace edema of BLE Lungs: No respiratory distress. Breath sounds diminished bilaterally without rales, rhonchi, or wheezes. Abdomen: Audible bowel sounds in all quadrants. Soft, nontender, nondistended.  Musculoskeletal: able to move all extremities with generalized weakness. No joint erythema or tenderness. Skin: Warm and dry. No diaphoretic Neurological: Alert and oriented to self Psychiatric: Appropriate mood and affect.   Labs Reviewed  CBC Latest Ref Rng 11/04/2014 10/24/2014 08/27/2014  WBC - 7.2 8.2 8.5  Hemoglobin 13.5 - 17.5 g/dL 13.0(A) 13.6 13.4  Hematocrit 41 - 53 % 41 42.0 40.0  Platelets 150 - 399 K/L 152 140(L) 177    CMP Latest Ref Rng 11/04/2014 10/24/2014 08/27/2014  Glucose 70 - 99 mg/dL - 102(H) 302(H)  BUN 4 - 21 mg/dL 16 15 20   Creatinine 0.6 - 1.3 mg/dL 10.0(A) 0.90 1.10  Sodium 137  - 147 mmol/L 142 144 138  Potassium 3.4 - 5.3 mmol/L 3.6 3.8 3.7  Chloride 96 - 112 mEq/L - 105 101  CO2 19 - 32 mEq/L - 27 26  Calcium 8.4 - 10.5 mg/dL - 8.7 8.8  Total Protein 6.0 - 8.3 g/dL - 5.9(L) 6.1  Total Bilirubin 0.3 - 1.2 mg/dL - 0.9 1.2  Alkaline Phos 39 - 117 U/L - 68 80  AST 0 - 37 U/L - 10 12  ALT 0 - 53 U/L - <5 <8    Lab Results  Component Value Date   HGBA1C 8.0* 11/04/2014    Lab Results  Component Value Date   TSH 0.85 11/04/2014    Lipid Panel     Component Value Date/Time   CHOL 116 11/04/2014   TRIG 66 11/04/2014   HDL 49 11/04/2014   CHOLHDL 2.5 08/27/2014 1018   VLDL 17 08/27/2014 1018   LDLCALC 54 11/04/2014   12/17//15: Urine mic/cr: 1020.4  Assessment & Plan 1. DM (diabetes mellitus) type II controlled with renal manifestation Last a1c 8.0. CBG range 77-315. Has one hypoglycemic episode this past month with CBG of 30. Responded well to glucagon. Will continue Lantus 20 units daily. Will change CBG monitoring from daily to twice daily in the morning and at bedtime. Nursing staff to provide patient with bedtime snack. Continue ACEI, low dose asa, statin. Continue to monitor his status  2. Hypothyroidism, unspecified hypothyroidism type Stable. Continue levothyroxine 39mcg daily and monitor.   3. Mixed Alzheimer's and vascular dementia Stable. Decline anticipated. Continue aricept 23mg  daily. Fall risk and pressure ulcer precautions in place. Continue to monitor for change in behaviors.  4. Chronic systolic congestive heart failure Weight stable overall. Euvolemic on exam. Continue lasix 40mg  daily with potassium 53meq twice daily, lisinopril 5mg  daily, labetalol 200mg  twice daily, and imdur 90mg  daily. Continue daily weight and monitor his status  5. Permanent atrial  fibrillation S/p ICD placement. Rate-controlled. Continue asa 81mg  daily. Continue cardizem 120mg  three times daily and labetalol 200mg  twice daily.   6. Depression with  anxiety Mood stable. Continue celexa 20mg  daily with xanax 0.125mg  daily at bedtime. Continue to monitor for change in mood  7. Essential hypertension BP elevated. Will increase lisinopril from 2.5mg  to 5mg  daily. Continue labetatol 200mg  twice daily, cardizem 120mg  three times daily, and lasix 40mg  daily with potassium supplement 28meq twice daily. Monitor BP & HR every shift x 3 days then daily. Continue to monitor his status.   8. Anemia, iron deficiency Stable. Continue iron supplment twice daily. Continue to monitor h&h  9. Glaucoma No issues. Continue cosopt gtt in each eye twice daily and monitor  10. Swallowing dysfunction No issues. Continue mechanical soft diet with thin liquids. Nursing staff to continue position patient upright in dining room for all meals to increase supervision as well as ensuring dentures are in place with Fixodent prior to all meals. Continue aspiration precautions  11. HLD Stable. Continue pravachol 40mg  daily and monitor.    Family/Staff Communication Plan of care discussed with resident and nursing staff. Resident and nursing staff verbalized understanding and agree with plan of care. No additional questions or concerns reported.    Arthur Holms, MSN, AGNP-C [facility] [address] [place], [place] [address] [phone] [8am-5pm] After hours: [phone]

## 2015-01-09 ENCOUNTER — Other Ambulatory Visit: Payer: Self-pay | Admitting: Physician Assistant

## 2015-01-13 ENCOUNTER — Ambulatory Visit: Payer: Medicare Other | Admitting: Nurse Practitioner

## 2015-01-15 ENCOUNTER — Ambulatory Visit: Payer: Medicare Other | Admitting: Cardiovascular Disease

## 2015-01-22 ENCOUNTER — Other Ambulatory Visit: Payer: Self-pay | Admitting: Internal Medicine

## 2015-01-23 ENCOUNTER — Telehealth: Payer: Self-pay | Admitting: Cardiovascular Disease

## 2015-01-23 NOTE — Telephone Encounter (Signed)
Please call,questions about his pacemaker.

## 2015-01-23 NOTE — Telephone Encounter (Signed)
Instructed daughter to call [address] office to get some direction, daughter agreed with plan

## 2015-02-05 ENCOUNTER — Encounter: Payer: Self-pay | Admitting: *Deleted

## 2015-02-06 ENCOUNTER — Non-Acute Institutional Stay (SKILLED_NURSING_FACILITY): Payer: Medicare Other | Admitting: Registered Nurse

## 2015-02-06 ENCOUNTER — Encounter: Payer: Self-pay | Admitting: Registered Nurse

## 2015-02-06 DIAGNOSIS — K5901 Slow transit constipation: Secondary | ICD-10-CM

## 2015-02-06 DIAGNOSIS — E039 Hypothyroidism, unspecified: Secondary | ICD-10-CM

## 2015-02-06 DIAGNOSIS — D509 Iron deficiency anemia, unspecified: Secondary | ICD-10-CM | POA: Diagnosis not present

## 2015-02-06 DIAGNOSIS — G309 Alzheimer's disease, unspecified: Secondary | ICD-10-CM | POA: Diagnosis not present

## 2015-02-06 DIAGNOSIS — F418 Other specified anxiety disorders: Secondary | ICD-10-CM

## 2015-02-06 DIAGNOSIS — I4821 Permanent atrial fibrillation: Secondary | ICD-10-CM

## 2015-02-06 DIAGNOSIS — I482 Chronic atrial fibrillation: Secondary | ICD-10-CM

## 2015-02-06 DIAGNOSIS — I10 Essential (primary) hypertension: Secondary | ICD-10-CM | POA: Diagnosis not present

## 2015-02-06 DIAGNOSIS — I5022 Chronic systolic (congestive) heart failure: Secondary | ICD-10-CM | POA: Diagnosis not present

## 2015-02-06 DIAGNOSIS — F0280 Dementia in other diseases classified elsewhere without behavioral disturbance: Secondary | ICD-10-CM

## 2015-02-06 DIAGNOSIS — H409 Unspecified glaucoma: Secondary | ICD-10-CM

## 2015-02-06 DIAGNOSIS — E785 Hyperlipidemia, unspecified: Secondary | ICD-10-CM | POA: Diagnosis not present

## 2015-02-06 DIAGNOSIS — F0150 Vascular dementia without behavioral disturbance: Secondary | ICD-10-CM

## 2015-02-06 DIAGNOSIS — N058 Unspecified nephritic syndrome with other morphologic changes: Secondary | ICD-10-CM

## 2015-02-06 DIAGNOSIS — E1129 Type 2 diabetes mellitus with other diabetic kidney complication: Secondary | ICD-10-CM

## 2015-02-06 NOTE — Progress Notes (Signed)
Patient ID: Troy Warren, male   DOB: 11/18/1931, 81 y.o.   MRN: [id number]   Place of Service: [facility]  Allergies  Allergen Reactions  . Lipitor [Atorvastatin]     weakness    Code Status: Full Code  Goals of Care: Longevity/LTC?  Chief Complaint  Patient presents with  . Medical Management of Chronic Issues    CHF, dementia, constipation, HTN, glaucoma, afib, depression    HPI 81 y.o. male with PMH of afib, HTN, hypothyroidism, depression w/ anxiety, mixed AD and vascular dementia, CHF, DM2 among others is being seen for a routine visit for management of his chronic issues. Has 7lb weight loss since last routine visit, but weight overall stable over the past four months. No recent fall or skin concerns reported. No change in behavior or functional status reported. No concerns from staff. CBG labile ranging from 70s to 500s. BP range 140s-180s/60-80s. No recent CHF exacerbations. Hypothyroidism stable on levothyroxine. Afib is stable on cardizem and labetalol. Mood stable on celexa and xanax. Anemia stable on iron supplement. Seen in room today. Denies any concerns  Review of Systems Constitutional: Negative for fever and chills HENT: Negative for ear pain and sore throat Eyes: Negative for eye pain and eye discharge  Cardiovascular: Negative for chest pain, palpitations, and leg swelling Respiratory: Negative cough, shortness of breath, and wheezing.  Gastrointestinal: Negative for nausea and vomiting. Negative for abdominal pain Genitourinary: Negative for dysuria Musculoskeletal: Negative for back pain, joint pain, and joint swelling  Neurological: Negative for dizziness and headache Skin: Negative for rash and wound.   Psychiatric: Negative for depression  Past Medical History  Diagnosis Date  . Hypertension   . Ischemic heart disease   . Hyperlipemia   . Depression   . Testicular cancer   . Polio     child polio  . Rheumatic fever   . Mild  dementia   . Mild dementia   . Systolic heart failure     acute on chronic  . CAD (coronary artery disease)   . Chronic atrial fibrillation   . Vitamin D deficiency   . Type II or unspecified type diabetes mellitus without mention of complication, not stated as uncontrolled   . Other testicular hypofunction   . Permanent atrial fibrillation 01/21/2014  . ICD, biventricular, in situ - Medtronic Protecta 2013, leads 2009 01/21/2014    New Generator Medtronic [place] model number [id number], serial number [id number]  Right atrial lead (chronic) Medtronic, model number [id number], serial [id number] (implanted 05/01/2008)  Right ventricular lead (chronic) Medtronic, model number [id number], serial number [id number] (implanted 05/01/2008)  Left ventricular lead Medtronic, model number [id number], serial number [id number] (implanted 05/01/2008)  Exp  . Pleural effusion, right 03/18/2014  . Emphysema 03/18/2014  . Pulmonary infiltrate 05/10/2014    CT chest 03/2014:  RUL interstitial/GGO     Past Surgical History  Procedure Laterality Date  . Coronary artery bypass graft  06/26/2002    LIMA to diagonal artery & SVG to the first obtuse margin  . Nm myocar perf wall motion  09/15/2006    Dilated LV w/small area of possible nontransmural inferior scar w/mild perinfarct ischemia.  No significant change from previous study.  . Cardiac defibrillator placement  05/01/2008    Medtronic  . Biv icd genertaor change out N/A 10/27/2012    Procedure: BIV ICD GENERTAOR CHANGE OUT;  Surgeon: Sanda Klein, MD;  Location: [facility];  Service: Cardiovascular;  Laterality: N/A;  History   Social History  . Marital Status: Married    Spouse Name: Judeth Porch  . Number of Children: 0  . Years of Education: 8th   Occupational History  . retired    Social History Main Topics  . Smoking status: Former Smoker    Quit date: 12/24/1968  . Smokeless tobacco: Never Used  . Alcohol Use: No  . Drug Use: No  . Sexual Activity: No    Other Topics Concern  . Not on file   Social History Narrative   Patient lives at home with spouse.   Patient has 8th grade education   Patient is right handed.   Caffeine Use: Rarely    Family History  Problem Relation Age of Onset  . Other Mother   . Heart attack Father       Medication List       This list is accurate as of: 02/06/15  7:07 PM.  Always use your most recent med list.               ALPRAZolam 0.25 MG tablet  Commonly known as:  XANAX  Take 0.125 mg by mouth at bedtime.     aspirin 81 MG chewable tablet  Chew 81 mg by mouth daily.     citalopram 40 MG tablet  Commonly known as:  CELEXA  Take 0.5 tablets (20 mg total) by mouth daily.     diltiazem 120 MG tablet  Commonly known as:  CARDIZEM  Take 1 tablet (120 mg total) by mouth 3 (three) times daily.     donepezil 23 MG Tabs tablet  Commonly known as:  ARICEPT  Take 1 tablet (23 mg total) by mouth at bedtime.     dorzolamide-timolol 22.3-6.8 MG/ML ophthalmic solution  Commonly known as:  COSOPT  Place 1 drop into both eyes 2 (two) times daily.     ferrous fumarate 325 (106 FE) MG Tabs tablet  Commonly known as:  HEMOCYTE - 106 mg FE  Take 1 tablet by mouth 2 (two) times daily.     furosemide 40 MG tablet  Commonly known as:  LASIX  Take 1 tablet (40 mg total) by mouth daily.     insulin detemir 100 UNIT/ML injection  Commonly known as:  LEVEMIR  Inject 10 Units into the skin 2 (two) times daily.     insulin lispro 100 UNIT/ML injection  Commonly known as:  HUMALOG  - Inject 5-10 Units into the skin 3 (three) times daily before meals. 5 units for cbg 200-300  - 8 units for cbg >300-450  - 10 units for cbg>450     isosorbide mononitrate 60 MG 24 hr tablet  Commonly known as:  IMDUR  Take 90 mg by mouth daily. Pt takes 1and 1/2 of the 60 mg pill to equal 90 mg.     KLOR-CON M10 10 MEQ tablet  Generic drug:  potassium chloride  TAKE 1 TABLET TWICE DAILY     labetalol 200 MG  tablet  Commonly known as:  NORMODYNE  TAKE 1 TABLET TWICE A DAY     levothyroxine 75 MCG tablet  Commonly known as:  SYNTHROID, LEVOTHROID  TAKE 1 TABLET EVERY DAY     lisinopril 10 MG tablet  Commonly known as:  PRINIVIL,ZESTRIL  Take 10 mg by mouth daily.     polyethylene glycol packet  Commonly known as:  MIRALAX / GLYCOLAX  Take 17 g by mouth daily.     pravastatin 40  MG tablet  Commonly known as:  PRAVACHOL  TAKE 1 TABLET EVERY DAY FOR CHOLESTEROL     VITAMIN B-12 PO  Take 1,000 mcg by mouth daily.     Vitamin D3 2000 UNITS Tabs  Take 2 tablets by mouth daily.        Physical Exam  BP 140/78 mmHg  Pulse 81  Temp(Src) 97.2 F (36.2 C)  Resp 18  Ht 5\' 7"  (1.702 m)  Wt 160 lb (72.576 kg)  BMI 25.05 kg/m2  SpO2 94%  Constitutional: WDWN elderly male in no acute distress. Conversant and pleasant with confusion HEENT: Normocephalic and atraumatic. PERRL. EOM intact. No scleral icterus. No nasal discharge or sinus tenderness. Oral mucosa moist. Posterior pharynx clear of any exudate or lesions.  Neck: Supple and nontender. No lymphadenopathy, masses, or thyromegaly. No JVD or carotid bruits. Cardiac: Normal S1, S2. Irregularly irregular without appreciable murmurs, rubs, or gallops. Distal pulses intact. Trace edema of BLE Lungs: No respiratory distress. Breath sounds diminished bilaterally without rales, rhonchi, or wheezes. Abdomen: Audible bowel sounds in all quadrants. Soft, nontender, nondistended.  Musculoskeletal: able to move all extremities with generalized weakness. No joint erythema or tenderness. Skin: Warm and dry. No diaphoretic Neurological: Alert and oriented to self Psychiatric: Appropriate mood and affect.   Labs Reviewed  CBC Latest Ref Rng 11/04/2014 10/24/2014 08/27/2014  WBC - 7.2 8.2 8.5  Hemoglobin 13.5 - 17.5 g/dL 13.0(A) 13.6 13.4  Hematocrit 41 - 53 % 41 42.0 40.0  Platelets 150 - 399 K/L 152 140(L) 177    CMP Latest Ref Rng  11/04/2014 10/24/2014 08/27/2014  Glucose 70 - 99 mg/dL - 102(H) 302(H)  BUN 4 - 21 mg/dL 16 15 20   Creatinine 0.6 - 1.3 mg/dL 10.0(A) 0.90 1.10  Sodium 137 - 147 mmol/L 142 144 138  Potassium 3.4 - 5.3 mmol/L 3.6 3.8 3.7  Chloride 96 - 112 mEq/L - 105 101  CO2 19 - 32 mEq/L - 27 26  Calcium 8.4 - 10.5 mg/dL - 8.7 8.8  Total Protein 6.0 - 8.3 g/dL - 5.9(L) 6.1  Total Bilirubin 0.3 - 1.2 mg/dL - 0.9 1.2  Alkaline Phos 39 - 117 U/L - 68 80  AST 0 - 37 U/L - 10 12  ALT 0 - 53 U/L - <5 <8    Lab Results  Component Value Date   HGBA1C 8.0* 11/04/2014    Lab Results  Component Value Date   TSH 0.85 11/04/2014    Lipid Panel     Component Value Date/Time   CHOL 116 11/04/2014   TRIG 66 11/04/2014   HDL 49 11/04/2014   CHOLHDL 2.5 08/27/2014 1018   VLDL 17 08/27/2014 1018   LDLCALC 54 11/04/2014   12/17//15: Urine mic/cr: 1020.4  Assessment & Plan 1. DM (diabetes mellitus) type II controlled with renal manifestation Last a1c 8.0. CBG labile ranging from 70s to 500s. Will discontinue Lantus and start Levemir 10 units twice daily with Humalog TID before meals: 5 units for cbg 200-300, 8 units for cbg >300-450, and 10 units for cbg>450. Will add patient to podiatry's and opthalmology's list for foot and eye exam. Continue ACEI, low dose asa, statin. Continue to monitor his status  2. Hypothyroidism, unspecified hypothyroidism type Stable. Continue levothyroxine 54mcg daily and monitor.   3. Mixed Alzheimer's and vascular dementia Stable. Decline anticipated. Continue aricept 23mg  daily at bedtime. Continue assist with ADL and monitor for change in behaviors. Fall and pressure ulcer precautions.   4.  Chronic systolic congestive heart failure Appears euvolemic on exam. EF 25-30% on Echo 03/2014. Continue lasix 40mg  daily with potassium 22meq twice daily, lisinopril 10mg  daily, labetalol 200mg  twice daily, and imdur 90mg  daily. Continue daily weight and monitor his status  5.  Permanent atrial fibrillation S/p ICD placement. Rate-controlled. Continue asa 81mg  daily. Continue cardizem 120mg  three times daily and labetalol 200mg  twice daily. Continue to monitor his status  6. Depression with anxiety Mood stable. Continue celexa 20mg  daily with xanax 0.125mg  daily at bedtime. Continue to monitor for change in mood  7. Essential hypertension BP elevated. Will increase lisinopril from 5mg  to 10mg  daily. Continue labetatol 200mg  twice daily, cardizem 120mg  three times daily, and lasix 40mg  daily with potassium supplement 4meq twice daily. Continue to monitor BP and his status    8. Anemia, iron deficiency Stable. Continue iron supplment twice daily. Continue to monitor h&h  9. Glaucoma No issues. Continue cosopt gtt in each eye twice daily  10. Constipation Stable. Continue miralax 17g daily and monitor.   11. HLD No issues. Continue pravachol 40mg  daily and monitor.    Family/Staff Communication Plan of care discussed with resident and nursing staff. Resident and nursing staff verbalized understanding and agree with plan of care. No additional questions or concerns reported.    Arthur Holms, MSN, AGNP-C [facility] [address] [place], [place] [address] [phone] [8am-5pm] After hours: [phone]

## 2015-03-06 ENCOUNTER — Non-Acute Institutional Stay (SKILLED_NURSING_FACILITY): Payer: Medicare Other | Admitting: Registered Nurse

## 2015-03-06 ENCOUNTER — Encounter: Payer: Self-pay | Admitting: Registered Nurse

## 2015-03-06 DIAGNOSIS — I10 Essential (primary) hypertension: Secondary | ICD-10-CM

## 2015-03-06 DIAGNOSIS — E1129 Type 2 diabetes mellitus with other diabetic kidney complication: Secondary | ICD-10-CM | POA: Diagnosis not present

## 2015-03-06 DIAGNOSIS — H1013 Acute atopic conjunctivitis, bilateral: Secondary | ICD-10-CM

## 2015-03-06 DIAGNOSIS — K5901 Slow transit constipation: Secondary | ICD-10-CM | POA: Diagnosis not present

## 2015-03-06 DIAGNOSIS — D509 Iron deficiency anemia, unspecified: Secondary | ICD-10-CM | POA: Diagnosis not present

## 2015-03-06 DIAGNOSIS — G309 Alzheimer's disease, unspecified: Secondary | ICD-10-CM

## 2015-03-06 DIAGNOSIS — I4821 Permanent atrial fibrillation: Secondary | ICD-10-CM

## 2015-03-06 DIAGNOSIS — I482 Chronic atrial fibrillation: Secondary | ICD-10-CM

## 2015-03-06 DIAGNOSIS — E785 Hyperlipidemia, unspecified: Secondary | ICD-10-CM

## 2015-03-06 DIAGNOSIS — E039 Hypothyroidism, unspecified: Secondary | ICD-10-CM | POA: Diagnosis not present

## 2015-03-06 DIAGNOSIS — F0280 Dementia in other diseases classified elsewhere without behavioral disturbance: Secondary | ICD-10-CM

## 2015-03-06 DIAGNOSIS — I5022 Chronic systolic (congestive) heart failure: Secondary | ICD-10-CM | POA: Diagnosis not present

## 2015-03-06 DIAGNOSIS — N058 Unspecified nephritic syndrome with other morphologic changes: Secondary | ICD-10-CM

## 2015-03-06 DIAGNOSIS — H409 Unspecified glaucoma: Secondary | ICD-10-CM

## 2015-03-06 DIAGNOSIS — F418 Other specified anxiety disorders: Secondary | ICD-10-CM | POA: Diagnosis not present

## 2015-03-06 DIAGNOSIS — F0150 Vascular dementia without behavioral disturbance: Secondary | ICD-10-CM

## 2015-03-06 NOTE — Progress Notes (Signed)
Patient ID: Troy Warren, male   DOB: 1931-08-21, 81 y.o.   MRN: [id number]   Place of Service: [facility]  Allergies  Allergen Reactions  . Lipitor [Atorvastatin]     weakness    Code Status: Full Code  Goals of Care: Longevity/LTC?  Chief Complaint  Patient presents with  . Medical Management of Chronic Issues    DM2, CHF, afib, constipation, depression w/ anxiety, HLD, glaucoma, HTN, hypothyroidism    HPI 81 y.o. male with PMH of afib, HTN, hypothyroidism, depression w/ anxiety, mixed AD and vascular dementia, CHF, DM2 among others is being seen for a routine visit for management of his chronic issues. Weight stable over the past 30 days. No recent fall or skin concerns reported. No change in behavior or functional status reported. BP range 120s-160s/60-90s.CBG labile ranging from 50s to 420s.  No recent CHF exacerbations. Hypothyroidism stable on levothyroxine. Mood stable on celexa and xanax. Afib is stable on cardizem and labetalol.  Anemia stable on iron supplement. Seen in room today. Per nursing staff, patient's wife is requesting for an allergy eye drop as he has been rubbing his eyes and they were red. Seen in room today. Denies any concerns.   Review of Systems Constitutional: Negative for fever and chills HENT: Negative for ear pain and sore throat Eyes: Negative for eye pain, eye discharge, redness, or itchiness Cardiovascular: Negative for chest pain, palpitations, and leg swelling Respiratory: Negative cough, shortness of breath, and wheezing.  Gastrointestinal: Negative for nausea and vomiting. Negative for abdominal pain Genitourinary: Negative for dysuria Musculoskeletal: Negative for back pain, joint pain, and joint swelling  Neurological: Negative for dizziness and headache Skin: Negative for rash and wound.   Psychiatric: Negative for depression  Past Medical History  Diagnosis Date  . Hypertension   . Ischemic heart disease   .  Hyperlipemia   . Depression   . Testicular cancer   . Polio     child polio  . Rheumatic fever   . Mild dementia   . Mild dementia   . Systolic heart failure     acute on chronic  . CAD (coronary artery disease)   . Chronic atrial fibrillation   . Vitamin D deficiency   . Type II or unspecified type diabetes mellitus without mention of complication, not stated as uncontrolled   . Other testicular hypofunction   . Permanent atrial fibrillation 01/21/2014  . ICD, biventricular, in situ - Medtronic Protecta 2013, leads 2009 01/21/2014    New Generator Medtronic [place] model number [id number], serial number [id number]  Right atrial lead (chronic) Medtronic, model number [id number], serial [id number] (implanted 05/01/2008)  Right ventricular lead (chronic) Medtronic, model number [id number], serial number [id number] (implanted 05/01/2008)  Left ventricular lead Medtronic, model number [id number], serial number [id number] (implanted 05/01/2008)  Exp  . Pleural effusion, right 03/18/2014  . Emphysema 03/18/2014  . Pulmonary infiltrate 05/10/2014    CT chest 03/2014:  RUL interstitial/GGO     Past Surgical History  Procedure Laterality Date  . Coronary artery bypass graft  06/26/2002    LIMA to diagonal artery & SVG to the first obtuse margin  . Nm myocar perf wall motion  09/15/2006    Dilated LV w/small area of possible nontransmural inferior scar w/mild perinfarct ischemia.  No significant change from previous study.  . Cardiac defibrillator placement  05/01/2008    Medtronic  . Biv icd genertaor change out N/A 10/27/2012    Procedure: BIV ICD GENERTAOR CHANGE  OUT;  Surgeon: Sanda Klein, MD;  Location: [facility] CATH LAB;  Service: Cardiovascular;  Laterality: N/A;    History   Social History  . Marital Status: Married    Spouse Name: Judeth Porch  . Number of Children: 0  . Years of Education: 8th   Occupational History  . retired    Social History Main Topics  . Smoking status: Former Smoker    Quit date:  12/24/1968  . Smokeless tobacco: Never Used  . Alcohol Use: No  . Drug Use: No  . Sexual Activity: No   Other Topics Concern  . Not on file   Social History Narrative   Patient lives at home with spouse.   Patient has 8th grade education   Patient is right handed.   Caffeine Use: Rarely    Family History  Problem Relation Age of Onset  . Other Mother   . Heart attack Father       Medication List       This list is accurate as of: 03/06/15  1:25 PM.  Always use your most recent med list.               ALPRAZolam 0.25 MG tablet  Commonly known as:  XANAX  Take 0.125 mg by mouth at bedtime.     aspirin 81 MG chewable tablet  Chew 81 mg by mouth daily.     citalopram 40 MG tablet  Commonly known as:  CELEXA  Take 0.5 tablets (20 mg total) by mouth daily.     diltiazem 120 MG tablet  Commonly known as:  CARDIZEM  Take 1 tablet (120 mg total) by mouth 3 (three) times daily.     donepezil 23 MG Tabs tablet  Commonly known as:  ARICEPT  Take 1 tablet (23 mg total) by mouth at bedtime.     dorzolamide-timolol 22.3-6.8 MG/ML ophthalmic solution  Commonly known as:  COSOPT  Place 1 drop into both eyes 2 (two) times daily.     ferrous fumarate 325 (106 FE) MG Tabs tablet  Commonly known as:  HEMOCYTE - 106 mg FE  Take 1 tablet by mouth 2 (two) times daily.     furosemide 40 MG tablet  Commonly known as:  LASIX  Take 1 tablet (40 mg total) by mouth daily.     insulin detemir 100 UNIT/ML injection  Commonly known as:  LEVEMIR  Inject 10 Units into the skin at bedtime.     insulin lispro 100 UNIT/ML injection  Commonly known as:  HUMALOG  - Inject 5-10 Units into the skin 3 (three) times daily before meals. 5 units for cbg 250-350  - 8 units for cbg >350-450  - 10 units for cbg>450     isosorbide mononitrate 60 MG 24 hr tablet  Commonly known as:  IMDUR  Take 90 mg by mouth daily. Pt takes 1and 1/2 of the 60 mg pill to equal 90 mg.     KLOR-CON M10 10  MEQ tablet  Generic drug:  potassium chloride  TAKE 1 TABLET TWICE DAILY     labetalol 200 MG tablet  Commonly known as:  NORMODYNE  TAKE 1 TABLET TWICE A DAY     levothyroxine 75 MCG tablet  Commonly known as:  SYNTHROID, LEVOTHROID  TAKE 1 TABLET EVERY DAY     linagliptin 5 MG Tabs tablet  Commonly known as:  TRADJENTA  Take 5 mg by mouth daily.     lisinopril 10 MG tablet  Commonly known as:  PRINIVIL,ZESTRIL  Take 10 mg by mouth daily.     loratadine 10 MG tablet  Commonly known as:  CLARITIN  Take 10 mg by mouth daily.     Olopatadine HCl 0.2 % Soln  Place 1 drop into both eyes 2 (two) times daily.     polyethylene glycol packet  Commonly known as:  MIRALAX / GLYCOLAX  Take 17 g by mouth daily.     pravastatin 40 MG tablet  Commonly known as:  PRAVACHOL  TAKE 1 TABLET EVERY DAY FOR CHOLESTEROL     VITAMIN B-12 PO  Take 1,000 mcg by mouth daily.     Vitamin D3 2000 UNITS Tabs  Take 2 tablets by mouth daily.        Physical Exam  BP 130/78 mmHg  Pulse 67  Temp(Src) 97.3 F (36.3 C)  Resp 18  Ht 5\' 7"  (1.702 m)  Wt 160 lb (72.576 kg)  BMI 25.05 kg/m2  SpO2 97%  Constitutional: frail elderly male in no acute distress. Conversant and pleasant with confusions HEENT: Normocephalic and atraumatic. PERRL. EOM intact. Bil eye slightly erythematous with scant amount of mucoid discharge noted on Right upper eyelashes. No scleral icterus. No nasal discharge or sinus tenderness. Oral mucosa moist. Posterior pharynx clear of any exudate or lesions.  Neck: Supple and nontender. No lymphadenopathy, masses, or thyromegaly. No JVD or carotid bruits. Cardiac: Normal S1, S2. Irregularly irregular without appreciable murmurs, rubs, or gallops. Distal pulses intact. 1+ pitting edema of BLE Lungs: No respiratory distress. Breath sounds diminished bilaterally without rales, rhonchi, or wheezes. Abdomen: Audible bowel sounds in all quadrants. Soft, nontender, nondistended.    Musculoskeletal: able to move all extremities with generalized weakness. No joint erythema or tenderness. Skin: Warm and dry. No diaphoretic Neurological: Alert and oriented to self Psychiatric: Appropriate mood and affect.   Labs Reviewed  CBC Latest Ref Rng 11/04/2014 10/24/2014 08/27/2014  WBC - 7.2 8.2 8.5  Hemoglobin 13.5 - 17.5 g/dL 13.0(A) 13.6 13.4  Hematocrit 41 - 53 % 41 42.0 40.0  Platelets 150 - 399 K/L 152 140(L) 177    CMP Latest Ref Rng 11/04/2014 10/24/2014 08/27/2014  Glucose 70 - 99 mg/dL - 102(H) 302(H)  BUN 4 - 21 mg/dL 16 15 20   Creatinine 0.6 - 1.3 mg/dL 10.0(A) 0.90 1.10  Sodium 137 - 147 mmol/L 142 144 138  Potassium 3.4 - 5.3 mmol/L 3.6 3.8 3.7  Chloride 96 - 112 mEq/L - 105 101  CO2 19 - 32 mEq/L - 27 26  Calcium 8.4 - 10.5 mg/dL - 8.7 8.8  Total Protein 6.0 - 8.3 g/dL - 5.9(L) 6.1  Total Bilirubin 0.3 - 1.2 mg/dL - 0.9 1.2  Alkaline Phos 39 - 117 U/L - 68 80  AST 0 - 37 U/L - 10 12  ALT 0 - 53 U/L - <5 <8    Lab Results  Component Value Date   HGBA1C 8.0* 11/04/2014    Lab Results  Component Value Date   TSH 0.85 11/04/2014    Lipid Panel     Component Value Date/Time   CHOL 116 11/04/2014   TRIG 66 11/04/2014   HDL 49 11/04/2014   CHOLHDL 2.5 08/27/2014 1018   VLDL 17 08/27/2014 1018   LDLCALC 54 11/04/2014   12/17//15: Urine mic/cr: 1020.4  Assessment & Plan 1. DM (diabetes mellitus) type II controlled with renal manifestation Last a1c 8.0. CBG labile ranging from 50s to 420s. Decrease to Levemir 10  units daily and change Humalog TID before meals to: 5 units for cbg 250-350, 8 units for cbg >350-450, and 10 units for cbg>450. Start trajenta 5mg  daily. Up to date with eye exam 02/17/15. Pending podiatry's exam. Continue cgb monitoring and continue to monitor his status  2. Hypothyroidism, unspecified hypothyroidism type Stable. Continue levothyroxine 34mcg daily and monitor.   3. Mixed Alzheimer's and vascular dementia Stable.  Decline anticipated. Continue aricept 23mg  daily at bedtime. Continue assist with ADL and monitor for change in behaviors. Continue fall and pressure ulcer prophylaxis  4. Chronic systolic congestive heart failure Clinically compensated. EF 25-30% on Echo 03/2014. Continue lasix 40mg  daily with potassium 30meq twice daily, lisinopril 10mg  daily, labetalol 200mg  twice daily, and imdur 90mg  daily. Check cmp. Continue daily weight and monitor his status.   5. Permanent atrial fibrillation S/p ICD placement. Rate-controlled. Continue asa 81mg  daily. Continue cardizem 120mg  three times daily and labetalol 200mg  twice daily. Continue to monitor his status  6. Depression with anxiety Mood stable. Continue celexa 20mg  daily with xanax 0.125mg  daily at bedtime. Continue to monitor for change in mood  7. Essential hypertension BP adequately controlled. Continue linopril 10mg  daily, labetatol 200mg  twice daily, cardizem 120mg  three times daily, and lasix 40mg  daily with potassium supplement 64meq twice daily. Continue to monitor  8. Anemia, iron deficiency Stable. Continue iron supplment twice daily. Recheck cbc  9. Glaucoma No issues. Continue cosopt gtt in each eye twice daily  10. Constipation Stable. Continue miralax 17g daily and monitor.   11. HLD Last LDL 54. Continue pravachol 40mg  daily and monitor.   12. Allergic conjunctivitis  Pataday 0.2% 1 gtt in each eye twice daily and claritin 10mg  daily.   Labs ordered: cbc, cmp, a1c  Family/Staff Communication Plan of care discussed with resident and nursing staff. Resident and nursing staff verbalized understanding and agree with plan of care. No additional questions or concerns reported.    Arthur Holms, MSN, AGNP-C [facility] [address] [place], [place] [address] [phone] [8am-5pm] After hours: [phone]

## 2015-03-10 LAB — HEMOGLOBIN A1C: Hgb A1c MFr Bld: 8.1 % — AB (ref 4.0–6.0)

## 2015-03-10 LAB — CBC AND DIFFERENTIAL
HCT: 40 % — AB (ref 41–53)
Hemoglobin: 13.1 g/dL — AB (ref 13.5–17.5)
PLATELETS: 155 10*3/uL (ref 150–399)

## 2015-03-10 LAB — BASIC METABOLIC PANEL
BUN: 16 mg/dL (ref 4–21)
Creatinine: 1 mg/dL (ref 0.6–1.3)
Glucose: 64 mg/dL
Potassium: 4.3 mmol/L (ref 3.4–5.3)
SODIUM: 140 mmol/L (ref 137–147)

## 2015-03-10 LAB — LIPID PANEL: LDl/HDL Ratio: 8.2

## 2015-03-13 ENCOUNTER — Encounter: Payer: Self-pay | Admitting: *Deleted

## 2015-03-19 ENCOUNTER — Telehealth: Payer: Self-pay | Admitting: Cardiovascular Disease

## 2015-03-19 NOTE — Telephone Encounter (Signed)
New message     Pt is scheduled for 06-03-15 for an appt and device check.  He is in a nursing facility and has not had his device checked since may 2015.  Will it be ok to wait until June or should he be seen sooner?

## 2015-03-20 NOTE — Telephone Encounter (Signed)
Will send this to [facility] in the [facility] at [facility]

## 2015-03-21 NOTE — Telephone Encounter (Signed)
I informed EC that the appt the pt was given was the first available for [facility]. I offered her an appt with the [facility] if she wanted it checked sooner, but she asked if she could check it with the pt's Care[employer] monitor. I told her that she could, but it would need to be sent through the fax line at the facility. EC voiced understanding. I explained to her the benefits of having a WireX. Will send to [facility] address. Again, EC voiced understanding.

## 2015-04-07 ENCOUNTER — Emergency Department (HOSPITAL_COMMUNITY): Payer: Medicare Other

## 2015-04-07 ENCOUNTER — Inpatient Hospital Stay (HOSPITAL_COMMUNITY)
Admission: EM | Admit: 2015-04-07 | Discharge: 2015-04-10 | DRG: 481 | Disposition: A | Discharge status: Skilled Nursing Facility | Payer: Medicare Other | Attending: Family Medicine | Admitting: Family Medicine

## 2015-04-07 ENCOUNTER — Non-Acute Institutional Stay (SKILLED_NURSING_FACILITY): Payer: Medicare Other | Admitting: Registered Nurse

## 2015-04-07 ENCOUNTER — Inpatient Hospital Stay (HOSPITAL_COMMUNITY): Payer: Medicare Other | Admitting: Certified Registered Nurse Anesthetist

## 2015-04-07 ENCOUNTER — Encounter (HOSPITAL_COMMUNITY)
Admission: EM | Disposition: A | Discharge status: Skilled Nursing Facility | Payer: Self-pay | Source: Home / Self Care | Attending: Family Medicine

## 2015-04-07 ENCOUNTER — Inpatient Hospital Stay (HOSPITAL_COMMUNITY): Payer: Medicare Other

## 2015-04-07 ENCOUNTER — Other Ambulatory Visit (HOSPITAL_COMMUNITY): Payer: Self-pay

## 2015-04-07 ENCOUNTER — Encounter (HOSPITAL_COMMUNITY): Payer: Self-pay | Admitting: Emergency Medicine

## 2015-04-07 ENCOUNTER — Encounter: Payer: Self-pay | Admitting: Registered Nurse

## 2015-04-07 DIAGNOSIS — W19XXXA Unspecified fall, initial encounter: Secondary | ICD-10-CM | POA: Diagnosis present

## 2015-04-07 DIAGNOSIS — Z8612 Personal history of poliomyelitis: Secondary | ICD-10-CM

## 2015-04-07 DIAGNOSIS — K5901 Slow transit constipation: Secondary | ICD-10-CM

## 2015-04-07 DIAGNOSIS — F0280 Dementia in other diseases classified elsewhere without behavioral disturbance: Secondary | ICD-10-CM | POA: Diagnosis present

## 2015-04-07 DIAGNOSIS — Z79899 Other long term (current) drug therapy: Secondary | ICD-10-CM | POA: Diagnosis not present

## 2015-04-07 DIAGNOSIS — Y92129 Unspecified place in nursing home as the place of occurrence of the external cause: Secondary | ICD-10-CM

## 2015-04-07 DIAGNOSIS — E785 Hyperlipidemia, unspecified: Secondary | ICD-10-CM | POA: Diagnosis present

## 2015-04-07 DIAGNOSIS — I4891 Unspecified atrial fibrillation: Secondary | ICD-10-CM

## 2015-04-07 DIAGNOSIS — I4821 Permanent atrial fibrillation: Secondary | ICD-10-CM

## 2015-04-07 DIAGNOSIS — N058 Unspecified nephritic syndrome with other morphologic changes: Secondary | ICD-10-CM

## 2015-04-07 DIAGNOSIS — G309 Alzheimer's disease, unspecified: Secondary | ICD-10-CM | POA: Diagnosis present

## 2015-04-07 DIAGNOSIS — S72001A Fracture of unspecified part of neck of right femur, initial encounter for closed fracture: Secondary | ICD-10-CM

## 2015-04-07 DIAGNOSIS — Z951 Presence of aortocoronary bypass graft: Secondary | ICD-10-CM | POA: Diagnosis not present

## 2015-04-07 DIAGNOSIS — I255 Ischemic cardiomyopathy: Secondary | ICD-10-CM | POA: Diagnosis present

## 2015-04-07 DIAGNOSIS — F418 Other specified anxiety disorders: Secondary | ICD-10-CM

## 2015-04-07 DIAGNOSIS — Z8547 Personal history of malignant neoplasm of testis: Secondary | ICD-10-CM | POA: Diagnosis not present

## 2015-04-07 DIAGNOSIS — F0150 Vascular dementia without behavioral disturbance: Secondary | ICD-10-CM

## 2015-04-07 DIAGNOSIS — D509 Iron deficiency anemia, unspecified: Secondary | ICD-10-CM | POA: Diagnosis not present

## 2015-04-07 DIAGNOSIS — E1122 Type 2 diabetes mellitus with diabetic chronic kidney disease: Secondary | ICD-10-CM | POA: Diagnosis present

## 2015-04-07 DIAGNOSIS — D62 Acute posthemorrhagic anemia: Secondary | ICD-10-CM | POA: Diagnosis not present

## 2015-04-07 DIAGNOSIS — Z95810 Presence of automatic (implantable) cardiac defibrillator: Secondary | ICD-10-CM | POA: Diagnosis not present

## 2015-04-07 DIAGNOSIS — J439 Emphysema, unspecified: Secondary | ICD-10-CM | POA: Diagnosis present

## 2015-04-07 DIAGNOSIS — S72141A Displaced intertrochanteric fracture of right femur, initial encounter for closed fracture: Secondary | ICD-10-CM | POA: Diagnosis present

## 2015-04-07 DIAGNOSIS — N183 Chronic kidney disease, stage 3 unspecified: Secondary | ICD-10-CM | POA: Diagnosis present

## 2015-04-07 DIAGNOSIS — I5042 Chronic combined systolic (congestive) and diastolic (congestive) heart failure: Secondary | ICD-10-CM | POA: Diagnosis present

## 2015-04-07 DIAGNOSIS — I5022 Chronic systolic (congestive) heart failure: Secondary | ICD-10-CM

## 2015-04-07 DIAGNOSIS — I129 Hypertensive chronic kidney disease with stage 1 through stage 4 chronic kidney disease, or unspecified chronic kidney disease: Secondary | ICD-10-CM | POA: Diagnosis present

## 2015-04-07 DIAGNOSIS — I482 Chronic atrial fibrillation: Secondary | ICD-10-CM | POA: Diagnosis present

## 2015-04-07 DIAGNOSIS — E039 Hypothyroidism, unspecified: Secondary | ICD-10-CM | POA: Diagnosis present

## 2015-04-07 DIAGNOSIS — I10 Essential (primary) hypertension: Secondary | ICD-10-CM

## 2015-04-07 DIAGNOSIS — E1129 Type 2 diabetes mellitus with other diabetic kidney complication: Secondary | ICD-10-CM | POA: Diagnosis not present

## 2015-04-07 DIAGNOSIS — Z87891 Personal history of nicotine dependence: Secondary | ICD-10-CM

## 2015-04-07 DIAGNOSIS — H409 Unspecified glaucoma: Secondary | ICD-10-CM

## 2015-04-07 DIAGNOSIS — Z794 Long term (current) use of insulin: Secondary | ICD-10-CM

## 2015-04-07 DIAGNOSIS — S72143A Displaced intertrochanteric fracture of unspecified femur, initial encounter for closed fracture: Secondary | ICD-10-CM

## 2015-04-07 DIAGNOSIS — IMO0001 Reserved for inherently not codable concepts without codable children: Secondary | ICD-10-CM

## 2015-04-07 DIAGNOSIS — S72141S Displaced intertrochanteric fracture of right femur, sequela: Secondary | ICD-10-CM | POA: Diagnosis not present

## 2015-04-07 DIAGNOSIS — I2510 Atherosclerotic heart disease of native coronary artery without angina pectoris: Secondary | ICD-10-CM | POA: Diagnosis present

## 2015-04-07 DIAGNOSIS — D649 Anemia, unspecified: Secondary | ICD-10-CM | POA: Diagnosis present

## 2015-04-07 DIAGNOSIS — Z8249 Family history of ischemic heart disease and other diseases of the circulatory system: Secondary | ICD-10-CM | POA: Diagnosis not present

## 2015-04-07 DIAGNOSIS — M25551 Pain in right hip: Secondary | ICD-10-CM | POA: Diagnosis present

## 2015-04-07 HISTORY — PX: INTRAMEDULLARY (IM) NAIL INTERTROCHANTERIC: SHX5875

## 2015-04-07 LAB — CBC WITH DIFFERENTIAL/PLATELET
Basophils Absolute: 0 10*3/uL (ref 0.0–0.1)
Basophils Relative: 0 % (ref 0–1)
Eosinophils Absolute: 0.2 10*3/uL (ref 0.0–0.7)
Eosinophils Relative: 1 % (ref 0–5)
HCT: 38 % — ABNORMAL LOW (ref 39.0–52.0)
Hemoglobin: 12.7 g/dL — ABNORMAL LOW (ref 13.0–17.0)
LYMPHS ABS: 0.9 10*3/uL (ref 0.7–4.0)
Lymphocytes Relative: 7 % — ABNORMAL LOW (ref 12–46)
MCH: 28.6 pg (ref 26.0–34.0)
MCHC: 33.4 g/dL (ref 30.0–36.0)
MCV: 85.6 fL (ref 78.0–100.0)
MONO ABS: 1 10*3/uL (ref 0.1–1.0)
Monocytes Relative: 9 % (ref 3–12)
NEUTROS PCT: 82 % — AB (ref 43–77)
Neutro Abs: 9.6 10*3/uL — ABNORMAL HIGH (ref 1.7–7.7)
PLATELETS: 113 10*3/uL — AB (ref 150–400)
RBC: 4.44 MIL/uL (ref 4.22–5.81)
RDW: 16.5 % — ABNORMAL HIGH (ref 11.5–15.5)
WBC: 11.6 10*3/uL — ABNORMAL HIGH (ref 4.0–10.5)

## 2015-04-07 LAB — BASIC METABOLIC PANEL
ANION GAP: 9 (ref 5–15)
BUN: 20 mg/dL (ref 6–23)
CO2: 23 mmol/L (ref 19–32)
CREATININE: 1.15 mg/dL (ref 0.50–1.35)
Calcium: 8.9 mg/dL (ref 8.4–10.5)
Chloride: 106 mmol/L (ref 96–112)
GFR calc Af Amer: 67 mL/min — ABNORMAL LOW (ref >90)
GFR, EST NON AFRICAN AMERICAN: 58 mL/min — AB (ref >90)
GLUCOSE: 344 mg/dL — AB (ref 70–99)
POTASSIUM: 4.1 mmol/L (ref 3.5–5.1)
SODIUM: 138 mmol/L (ref 135–145)

## 2015-04-07 LAB — APTT: aPTT: 33 seconds (ref 24–37)

## 2015-04-07 LAB — I-STAT CHEM 8, ED
BUN: 21 mg/dL (ref 6–23)
CHLORIDE: 104 mmol/L (ref 96–112)
Calcium, Ion: 1.17 mmol/L (ref 1.13–1.30)
Creatinine, Ser: 1.1 mg/dL (ref 0.50–1.35)
Glucose, Bld: 347 mg/dL — ABNORMAL HIGH (ref 70–99)
HEMATOCRIT: 38 % — AB (ref 39.0–52.0)
HEMOGLOBIN: 12.9 g/dL — AB (ref 13.0–17.0)
POTASSIUM: 4 mmol/L (ref 3.5–5.1)
Sodium: 138 mmol/L (ref 135–145)
TCO2: 18 mmol/L (ref 0–100)

## 2015-04-07 LAB — PROTIME-INR
INR: 1.25 (ref 0.00–1.49)
Prothrombin Time: 15.8 seconds — ABNORMAL HIGH (ref 11.6–15.2)

## 2015-04-07 LAB — GLUCOSE, CAPILLARY
GLUCOSE-CAPILLARY: 275 mg/dL — AB (ref 70–99)
Glucose-Capillary: 257 mg/dL — ABNORMAL HIGH (ref 70–99)

## 2015-04-07 LAB — TYPE AND SCREEN
ABO/RH(D): O POS
Antibody Screen: NEGATIVE

## 2015-04-07 LAB — ABO/RH: ABO/RH(D): O POS

## 2015-04-07 LAB — CBG MONITORING, ED: Glucose-Capillary: 299 mg/dL — ABNORMAL HIGH (ref 70–99)

## 2015-04-07 SURGERY — FIXATION, FRACTURE, INTERTROCHANTERIC, WITH INTRAMEDULLARY ROD
Anesthesia: General | Site: Hip | Laterality: Right

## 2015-04-07 MED ORDER — POLYETHYLENE GLYCOL 3350 17 G PO PACK
17.0000 g | PACK | Freq: Every day | ORAL | Status: DC
  Administered 2015-04-08 – 2015-04-10 (×3): 17 g via ORAL
  Filled 2015-04-07 (×3): qty 1

## 2015-04-07 MED ORDER — INSULIN ASPART 100 UNIT/ML ~~LOC~~ SOLN
5.0000 [IU] | Freq: Once | SUBCUTANEOUS | Status: AC
  Administered 2015-04-07: 5 [IU] via SUBCUTANEOUS

## 2015-04-07 MED ORDER — ROCURONIUM BROMIDE 100 MG/10ML IV SOLN
INTRAVENOUS | Status: DC | PRN
  Administered 2015-04-07: 30 mg via INTRAVENOUS

## 2015-04-07 MED ORDER — ONDANSETRON HCL 4 MG/2ML IJ SOLN
INTRAMUSCULAR | Status: DC | PRN
  Administered 2015-04-07: 4 mg via INTRAVENOUS

## 2015-04-07 MED ORDER — SODIUM CHLORIDE 0.9 % IV SOLN
INTRAVENOUS | Status: DC
  Administered 2015-04-07: via INTRAVENOUS

## 2015-04-07 MED ORDER — ROCURONIUM BROMIDE 50 MG/5ML IV SOLN
INTRAVENOUS | Status: AC
  Filled 2015-04-07: qty 1

## 2015-04-07 MED ORDER — MORPHINE SULFATE 2 MG/ML IJ SOLN
0.5000 mg | INTRAMUSCULAR | Status: DC | PRN
Start: 2015-04-07 — End: 2015-04-10

## 2015-04-07 MED ORDER — DEXTROSE 5 % IV SOLN: 500.0000 mg | Freq: Four times a day (QID) | INTRAVENOUS | Status: DC | PRN

## 2015-04-07 MED ORDER — MEPERIDINE HCL 25 MG/ML IJ SOLN: 6.2500 mg | INTRAMUSCULAR | Status: DC | PRN

## 2015-04-07 MED ORDER — LEVOTHYROXINE SODIUM 75 MCG PO TABS
75.0000 ug | ORAL_TABLET | Freq: Every day | ORAL | Status: DC
  Administered 2015-04-08 – 2015-04-10 (×3): 75 ug via ORAL
  Filled 2015-04-07 (×3): qty 1

## 2015-04-07 MED ORDER — ALPRAZOLAM 0.25 MG PO TABS
0.1250 mg | ORAL_TABLET | Freq: Every day | ORAL | Status: DC
  Administered 2015-04-08 – 2015-04-09 (×3): 0.125 mg via ORAL
  Filled 2015-04-07 (×3): qty 1

## 2015-04-07 MED ORDER — ACETAMINOPHEN 325 MG PO TABS: 650.0000 mg | ORAL_TABLET | Freq: Four times a day (QID) | ORAL | Status: DC | PRN

## 2015-04-07 MED ORDER — CEFAZOLIN SODIUM-DEXTROSE 2-3 GM-% IV SOLR
INTRAVENOUS | Status: DC | PRN
  Administered 2015-04-07: 2 g via INTRAVENOUS

## 2015-04-07 MED ORDER — POLYETHYLENE GLYCOL 3350 17 G PO PACK: 17.0000 g | PACK | Freq: Every day | ORAL | Status: DC | PRN

## 2015-04-07 MED ORDER — MORPHINE SULFATE 2 MG/ML IJ SOLN: 0.5000 mg | INTRAMUSCULAR | Status: DC | PRN

## 2015-04-07 MED ORDER — CEFAZOLIN SODIUM-DEXTROSE 2-3 GM-% IV SOLR
2.0000 g | INTRAVENOUS | Status: AC
Start: 2015-04-08 — End: 2015-04-09
  Filled 2015-04-07: qty 50

## 2015-04-07 MED ORDER — PROPOFOL 10 MG/ML IV BOLUS
INTRAVENOUS | Status: AC
  Filled 2015-04-07: qty 20

## 2015-04-07 MED ORDER — LABETALOL HCL 200 MG PO TABS
200.0000 mg | ORAL_TABLET | Freq: Two times a day (BID) | ORAL | Status: DC
  Administered 2015-04-08 – 2015-04-10 (×5): 200 mg via ORAL
  Filled 2015-04-07 (×7): qty 1

## 2015-04-07 MED ORDER — SODIUM CHLORIDE 0.9 % IV SOLN: INTRAVENOUS | Status: DC

## 2015-04-07 MED ORDER — OLOPATADINE HCL 0.1 % OP SOLN
1.0000 [drp] | Freq: Two times a day (BID) | OPHTHALMIC | Status: DC
  Administered 2015-04-08 – 2015-04-10 (×6): 1 [drp] via OPHTHALMIC
  Filled 2015-04-07: qty 5

## 2015-04-07 MED ORDER — ISOSORBIDE MONONITRATE ER 60 MG PO TB24
90.0000 mg | ORAL_TABLET | Freq: Every day | ORAL | Status: DC
  Administered 2015-04-08 – 2015-04-10 (×3): 90 mg via ORAL
  Filled 2015-04-07 (×6): qty 1

## 2015-04-07 MED ORDER — BISACODYL 10 MG RE SUPP: 10.0000 mg | Freq: Every day | RECTAL | Status: DC | PRN

## 2015-04-07 MED ORDER — PRAVASTATIN SODIUM 40 MG PO TABS
40.0000 mg | ORAL_TABLET | Freq: Every day | ORAL | Status: DC
  Administered 2015-04-08 – 2015-04-10 (×3): 40 mg via ORAL
  Filled 2015-04-07 (×3): qty 1

## 2015-04-07 MED ORDER — LISINOPRIL 10 MG PO TABS
10.0000 mg | ORAL_TABLET | Freq: Every day | ORAL | Status: DC
  Administered 2015-04-08 – 2015-04-10 (×3): 10 mg via ORAL
  Filled 2015-04-07 (×3): qty 1

## 2015-04-07 MED ORDER — METHOCARBAMOL 500 MG PO TABS
500.0000 mg | ORAL_TABLET | Freq: Four times a day (QID) | ORAL | Status: DC | PRN
  Administered 2015-04-10: 500 mg via ORAL
  Filled 2015-04-07 (×2): qty 1

## 2015-04-07 MED ORDER — PHENYLEPHRINE HCL 10 MG/ML IJ SOLN
INTRAMUSCULAR | Status: DC | PRN
  Administered 2015-04-07 (×2): 60 ug via INTRAVENOUS
  Administered 2015-04-07: 40 ug via INTRAVENOUS

## 2015-04-07 MED ORDER — METOCLOPRAMIDE HCL 5 MG PO TABS: 5.0000 mg | ORAL_TABLET | Freq: Three times a day (TID) | ORAL | Status: DC | PRN

## 2015-04-07 MED ORDER — FENTANYL CITRATE (PF) 100 MCG/2ML IJ SOLN: 25.0000 ug | INTRAMUSCULAR | Status: DC | PRN

## 2015-04-07 MED ORDER — ENOXAPARIN SODIUM 40 MG/0.4ML ~~LOC~~ SOLN
40.0000 mg | SUBCUTANEOUS | Status: DC
  Administered 2015-04-08 – 2015-04-10 (×3): 40 mg via SUBCUTANEOUS
  Filled 2015-04-07 (×3): qty 0.4

## 2015-04-07 MED ORDER — BISACODYL 10 MG RE SUPP
10.0000 mg | Freq: Every day | RECTAL | Status: DC | PRN
Start: 2015-04-07 — End: 2015-04-10

## 2015-04-07 MED ORDER — PROMETHAZINE HCL 25 MG/ML IJ SOLN: 6.2500 mg | INTRAMUSCULAR | Status: DC | PRN

## 2015-04-07 MED ORDER — DORZOLAMIDE HCL-TIMOLOL MAL 2-0.5 % OP SOLN
1.0000 [drp] | Freq: Two times a day (BID) | OPHTHALMIC | Status: DC
  Administered 2015-04-08 – 2015-04-10 (×6): 1 [drp] via OPHTHALMIC
  Filled 2015-04-07: qty 10

## 2015-04-07 MED ORDER — LINAGLIPTIN 5 MG PO TABS
5.0000 mg | ORAL_TABLET | Freq: Every day | ORAL | Status: DC
  Administered 2015-04-08 – 2015-04-10 (×3): 5 mg via ORAL
  Filled 2015-04-07 (×3): qty 1

## 2015-04-07 MED ORDER — HYDROCODONE-ACETAMINOPHEN 5-325 MG PO TABS
1.0000 | ORAL_TABLET | Freq: Four times a day (QID) | ORAL | Status: DC | PRN
  Filled 2015-04-07 (×3): qty 1

## 2015-04-07 MED ORDER — LACTATED RINGERS IV SOLN
INTRAVENOUS | Status: DC | PRN
  Administered 2015-04-07: 21:00:00 via INTRAVENOUS

## 2015-04-07 MED ORDER — GLYCOPYRROLATE 0.2 MG/ML IJ SOLN
INTRAMUSCULAR | Status: DC | PRN
  Administered 2015-04-07: 0.6 mg via INTRAVENOUS

## 2015-04-07 MED ORDER — 0.9 % SODIUM CHLORIDE (POUR BTL) OPTIME
TOPICAL | Status: DC | PRN
  Administered 2015-04-07: 1000 mL

## 2015-04-07 MED ORDER — LORATADINE 10 MG PO TABS
10.0000 mg | ORAL_TABLET | Freq: Every day | ORAL | Status: DC
  Administered 2015-04-08 – 2015-04-10 (×3): 10 mg via ORAL
  Filled 2015-04-07 (×3): qty 1

## 2015-04-07 MED ORDER — PHENOL 1.4 % MT LIQD: 1.0000 | OROMUCOSAL | Status: DC | PRN

## 2015-04-07 MED ORDER — ACETAMINOPHEN 650 MG RE SUPP: 650.0000 mg | Freq: Four times a day (QID) | RECTAL | Status: DC | PRN

## 2015-04-07 MED ORDER — PROPOFOL 10 MG/ML IV BOLUS
INTRAVENOUS | Status: DC | PRN
  Administered 2015-04-07: 90 mg via INTRAVENOUS

## 2015-04-07 MED ORDER — FENTANYL CITRATE (PF) 100 MCG/2ML IJ SOLN
INTRAMUSCULAR | Status: DC | PRN
  Administered 2015-04-07: 50 ug via INTRAVENOUS
  Administered 2015-04-07: 25 ug via INTRAVENOUS

## 2015-04-07 MED ORDER — CITALOPRAM HYDROBROMIDE 20 MG PO TABS
20.0000 mg | ORAL_TABLET | Freq: Every day | ORAL | Status: DC
  Administered 2015-04-08 – 2015-04-10 (×3): 20 mg via ORAL
  Filled 2015-04-07 (×3): qty 1

## 2015-04-07 MED ORDER — INSULIN ASPART 100 UNIT/ML ~~LOC~~ SOLN
0.0000 [IU] | SUBCUTANEOUS | Status: DC
  Administered 2015-04-08: 2 [IU] via SUBCUTANEOUS
  Administered 2015-04-08: 3 [IU] via SUBCUTANEOUS

## 2015-04-07 MED ORDER — MENTHOL 3 MG MT LOZG: 1.0000 | LOZENGE | OROMUCOSAL | Status: DC | PRN

## 2015-04-07 MED ORDER — LIDOCAINE HCL (CARDIAC) 20 MG/ML IV SOLN
INTRAVENOUS | Status: DC | PRN
  Administered 2015-04-07: 70 mg via INTRAVENOUS

## 2015-04-07 MED ORDER — ONDANSETRON HCL 4 MG PO TABS: 4.0000 mg | ORAL_TABLET | Freq: Four times a day (QID) | ORAL | Status: DC | PRN

## 2015-04-07 MED ORDER — NEOSTIGMINE METHYLSULFATE 10 MG/10ML IV SOLN
INTRAVENOUS | Status: DC | PRN
  Administered 2015-04-07: 4 mg via INTRAVENOUS

## 2015-04-07 MED ORDER — INSULIN DETEMIR 100 UNIT/ML ~~LOC~~ SOLN
10.0000 [IU] | Freq: Every day | SUBCUTANEOUS | Status: DC
  Administered 2015-04-08 – 2015-04-09 (×3): 10 [IU] via SUBCUTANEOUS
  Filled 2015-04-07 (×4): qty 0.1

## 2015-04-07 MED ORDER — INSULIN ASPART 100 UNIT/ML ~~LOC~~ SOLN
SUBCUTANEOUS | Status: AC
  Filled 2015-04-07: qty 5

## 2015-04-07 MED ORDER — FENTANYL CITRATE (PF) 250 MCG/5ML IJ SOLN
INTRAMUSCULAR | Status: AC
Start: 2015-04-07 — End: 2015-04-07
  Filled 2015-04-07: qty 5

## 2015-04-07 MED ORDER — METOCLOPRAMIDE HCL 5 MG/ML IJ SOLN: 5.0000 mg | Freq: Three times a day (TID) | INTRAMUSCULAR | Status: DC | PRN

## 2015-04-07 MED ORDER — HYDROCODONE-ACETAMINOPHEN 5-325 MG PO TABS
1.0000 | ORAL_TABLET | Freq: Four times a day (QID) | ORAL | Status: DC | PRN
  Administered 2015-04-08 – 2015-04-10 (×5): 1 via ORAL
  Filled 2015-04-07 (×2): qty 1

## 2015-04-07 MED ORDER — DILTIAZEM HCL 60 MG PO TABS
120.0000 mg | ORAL_TABLET | Freq: Three times a day (TID) | ORAL | Status: DC
  Administered 2015-04-08 – 2015-04-10 (×8): 120 mg via ORAL
  Filled 2015-04-07 (×10): qty 2

## 2015-04-07 MED ORDER — ACETAMINOPHEN 325 MG PO TABS: 325.0000 mg | ORAL_TABLET | Freq: Four times a day (QID) | ORAL | Status: DC | PRN

## 2015-04-07 MED ORDER — ONDANSETRON HCL 4 MG/2ML IJ SOLN: 4.0000 mg | Freq: Four times a day (QID) | INTRAMUSCULAR | Status: DC | PRN

## 2015-04-07 MED ORDER — ENOXAPARIN SODIUM 40 MG/0.4ML ~~LOC~~ SOLN: 40.0000 mg | SUBCUTANEOUS | Status: DC

## 2015-04-07 MED ORDER — FLEET ENEMA 7-19 GM/118ML RE ENEM: 1.0000 | ENEMA | Freq: Once | RECTAL | Status: AC | PRN

## 2015-04-07 MED ORDER — FERROUS FUMARATE 325 (106 FE) MG PO TABS
1.0000 | ORAL_TABLET | Freq: Two times a day (BID) | ORAL | Status: DC
  Administered 2015-04-08 – 2015-04-09 (×4): 106 mg via ORAL
  Administered 2015-04-09: 1 via ORAL
  Administered 2015-04-10: 106 mg via ORAL
  Filled 2015-04-07 (×8): qty 1

## 2015-04-07 MED ORDER — POTASSIUM CHLORIDE CRYS ER 20 MEQ PO TBCR
10.0000 meq | EXTENDED_RELEASE_TABLET | Freq: Two times a day (BID) | ORAL | Status: DC
  Administered 2015-04-08 – 2015-04-10 (×6): 10 meq via ORAL
  Filled 2015-04-07 (×6): qty 1

## 2015-04-07 MED ORDER — CHLORHEXIDINE GLUCONATE 4 % EX LIQD
60.0000 mL | Freq: Once | CUTANEOUS | Status: DC
  Filled 2015-04-07: qty 60

## 2015-04-07 MED ORDER — FUROSEMIDE 40 MG PO TABS
40.0000 mg | ORAL_TABLET | Freq: Every day | ORAL | Status: DC
Start: 2015-04-08 — End: 2015-04-10
  Administered 2015-04-08 – 2015-04-10 (×3): 40 mg via ORAL
  Filled 2015-04-07 (×3): qty 1

## 2015-04-07 MED ORDER — LACTATED RINGERS IV SOLN: INTRAVENOUS | Status: AC

## 2015-04-07 MED ORDER — VITAMIN B-12 1000 MCG PO TABS
1000.0000 ug | ORAL_TABLET | Freq: Every day | ORAL | Status: DC
  Administered 2015-04-08 – 2015-04-10 (×3): 1000 ug via ORAL
  Filled 2015-04-07 (×3): qty 1

## 2015-04-07 MED ORDER — VITAMIN D 1000 UNITS PO TABS
2000.0000 [IU] | ORAL_TABLET | Freq: Every day | ORAL | Status: DC
  Administered 2015-04-08 – 2015-04-10 (×3): 2000 [IU] via ORAL
  Filled 2015-04-07 (×3): qty 2

## 2015-04-07 MED ORDER — FERROUS SULFATE 325 (65 FE) MG PO TABS
325.0000 mg | ORAL_TABLET | Freq: Three times a day (TID) | ORAL | Status: DC
  Administered 2015-04-08 – 2015-04-10 (×7): 325 mg via ORAL
  Filled 2015-04-07 (×7): qty 1

## 2015-04-07 MED ORDER — DONEPEZIL HCL 23 MG PO TABS
23.0000 mg | ORAL_TABLET | Freq: Every day | ORAL | Status: DC
  Administered 2015-04-08 – 2015-04-09 (×3): 23 mg via ORAL
  Filled 2015-04-07 (×4): qty 1

## 2015-04-07 MED ORDER — CEFAZOLIN SODIUM-DEXTROSE 2-3 GM-% IV SOLR
2.0000 g | Freq: Four times a day (QID) | INTRAVENOUS | Status: AC
  Administered 2015-04-08 (×2): 2 g via INTRAVENOUS
  Filled 2015-04-07 (×3): qty 50

## 2015-04-07 SURGICAL SUPPLY — 45 items
BIT DRILL CANN LG 4.3MM (BIT) ×1 IMPLANT
BLADE SURG 15 STRL LF DISP TIS (BLADE) IMPLANT
BLADE SURG 15 STRL SS (BLADE)
BNDG CONFORM 3 STRL LF (GAUZE/BANDAGES/DRESSINGS) IMPLANT
CLOSURE WOUND 1/2 X4 (GAUZE/BANDAGES/DRESSINGS)
COVER PERINEAL POST (MISCELLANEOUS) ×3 IMPLANT
DISTAL GRADUATED LONG DRILL 4.3MM ×2 IMPLANT
DRAPE STERI IOBAN 125X83 (DRAPES) ×3 IMPLANT
DRILL BIT CANN LG 4.3MM (BIT) ×3
DRSG ADAPTIC 3X8 NADH LF (GAUZE/BANDAGES/DRESSINGS) IMPLANT
DRSG MEPILEX BORDER 4X4 (GAUZE/BANDAGES/DRESSINGS) ×3 IMPLANT
DRSG MEPILEX BORDER 4X8 (GAUZE/BANDAGES/DRESSINGS) ×3 IMPLANT
ELECT REM PT RETURN 9FT ADLT (ELECTROSURGICAL) ×3
ELECTRODE REM PT RTRN 9FT ADLT (ELECTROSURGICAL) ×1 IMPLANT
GLOVE BIOGEL PI ORTHO PRO 7.5 (GLOVE) ×2
GLOVE BIOGEL PI ORTHO PRO SZ8 (GLOVE) ×2
GLOVE ORTHO TXT STRL SZ7.5 (GLOVE) ×3 IMPLANT
GLOVE PI ORTHO PRO STRL 7.5 (GLOVE) ×1 IMPLANT
GLOVE PI ORTHO PRO STRL SZ8 (GLOVE) ×1 IMPLANT
GLOVE SURG ORTHO 8.5 STRL (GLOVE) ×3 IMPLANT
GOWN STRL REUS W/ TWL LRG LVL3 (GOWN DISPOSABLE) ×2 IMPLANT
GOWN STRL REUS W/ TWL XL LVL3 (GOWN DISPOSABLE) ×2 IMPLANT
GOWN STRL REUS W/TWL LRG LVL3 (GOWN DISPOSABLE) ×6
GOWN STRL REUS W/TWL XL LVL3 (GOWN DISPOSABLE) ×6
HIP FRAC NAIL LAG SCR 10.5X100 (Orthopedic Implant) ×2 IMPLANT
KIT BASIN OR (CUSTOM PROCEDURE TRAY) ×3 IMPLANT
KIT ROOM TURNOVER OR (KITS) ×3 IMPLANT
LINER BOOT UNIVERSAL DISP (MISCELLANEOUS) ×3 IMPLANT
MANIFOLD NEPTUNE II (INSTRUMENTS) ×3 IMPLANT
NAIL HIP FRACT 130D 11X180 (Screw) ×3 IMPLANT
NS IRRIG 1000ML POUR BTL (IV SOLUTION) ×3 IMPLANT
PACK GENERAL/GYN (CUSTOM PROCEDURE TRAY) ×3 IMPLANT
PAD ARMBOARD 7.5X6 YLW CONV (MISCELLANEOUS) ×6 IMPLANT
PIN GUIDE 3.2 903003004 (MISCELLANEOUS) ×3 IMPLANT
SCREW BONE CORTICAL 5.0X36 (Screw) ×3 IMPLANT
SCREW CANN THRD AFF 10.5X100 (Orthopedic Implant) ×1 IMPLANT
SPONGE LAP 18X18 X RAY DECT (DISPOSABLE) ×3 IMPLANT
STAPLER VISISTAT 35W (STAPLE) ×6 IMPLANT
STRIP CLOSURE SKIN 1/2X4 (GAUZE/BANDAGES/DRESSINGS) IMPLANT
SUT VIC AB 0 CT1 27 (SUTURE) ×3
SUT VIC AB 0 CT1 27XBRD ANBCTR (SUTURE) ×1 IMPLANT
SUT VIC AB 2-0 CT1 27 (SUTURE) ×3
SUT VIC AB 2-0 CT1 TAPERPNT 27 (SUTURE) ×1 IMPLANT
TRAY FOLEY CATH 16FRSI W/METER (SET/KITS/TRAYS/PACK) IMPLANT
WATER STERILE IRR 1000ML POUR (IV SOLUTION) ×3 IMPLANT

## 2015-04-07 NOTE — ED Notes (Signed)
MD at bedside. 

## 2015-04-07 NOTE — ED Notes (Signed)
Pt transported to Short Stay 

## 2015-04-07 NOTE — Anesthesia Procedure Notes (Signed)
Procedure Name: Intubation Performed by: Gean Maidens Pre-anesthesia Checklist: Patient identified, Emergency Drugs available, Suction available, Patient being monitored and Timeout performed Patient Re-evaluated:Patient Re-evaluated prior to inductionOxygen Delivery Method: Circle system utilized Preoxygenation: Pre-oxygenation with 100% oxygen Intubation Type: IV induction Ventilation: Mask ventilation without difficulty Laryngoscope Size: Mac and 3 Grade View: Grade I Tube type: Oral Tube size: 7.5 mm Number of attempts: 1 Placement Confirmation: ETT inserted through vocal cords under direct vision,  breath sounds checked- equal and bilateral,  positive ETCO2 and CO2 detector Secured at: 23 cm Tube secured with: Tape Dental Injury: Teeth and Oropharynx as per pre-operative assessment

## 2015-04-07 NOTE — ED Notes (Signed)
Attempted foley catheter with assistance. Dr. Kathrynn Humble notified.

## 2015-04-07 NOTE — Anesthesia Preprocedure Evaluation (Addendum)
Anesthesia Evaluation  Patient identified by MRN, date of birth, ID band Patient awake    Reviewed: Allergy & Precautions, NPO status , Patient's Chart, lab work & pertinent test results  Airway Mallampati: II  TM Distance: >3 FB Neck ROM: Full    Dental no notable dental hx.    Pulmonary neg pulmonary ROS, former smoker,  breath sounds clear to auscultation  Pulmonary exam normal       Cardiovascular hypertension, Pt. on medications + CAD and +CHF Rhythm:Irregular Rate:Normal  Echo 03/2014 - Left ventricle: The cavity size was severely dilated. Wall thickness was increased in a pattern of severe LVH. Therewas mild concentric hypertrophy. Systolic function wasseverely reduced. The estimated ejection fraction was inthe range of 25% to 30%. Wall motion was normal; therewere no regional wall motion abnormalities. Dopplerparameters are consistent with a reversible restrictivepattern, indicative of decreased left ventriculardiastolic compliance and/or increased left atrial pressure(grade 3 diastolic dysfunction). Doppler parameters areconsistent withseverely elevated ventricular end-diastolicfilling pressure and elevated left atrial fillingpressure. - Aortic valve: Trileaflet; moderately thickened, moderately calcified leaflets. Cusp separation was moderately reduced. There was mild stenosis. Mild regurgitation. - Mitral valve: Mild regurgitation. - Left atrium: The atrium was mildly dilated. - Right ventricle: The cavity size was mildly dilated. Wall thickness was normal. Systolic function was normal. - Tricuspid valve: Mild regurgitation. - Pulmonary arteries: Systolic pressure was mildly increased. PA peak pressure: 59mm Hg (S).  - Impressions:  Compared to the prior study from 10/29/2010the left ventricle is now severely dilated with severelyimpaired ejection fraction and restrictive pattern ofdiastolic function  with severely increased LV fillingpressures. LVEF is 25-30%, previosuly reported as 40-45%.RVSP is now mildly elevated, previously reported asnormal.  Impressions: - Compared to the prior study from 10/10/2009 the left ventricle is now severely dilated with severely impaired ejection fraction and restrictive pattern of diastolic function with severely increased LV filling pressures. LVEF is 25-30%, previosuly reported as 40-45%. RVSP is nowmildly elevated, previously reported as normal.   Neuro/Psych negative neurological ROS  negative psych ROS   GI/Hepatic negative GI ROS, Neg liver ROS,   Endo/Other  diabetes, Type 2Hypothyroidism   Renal/GU Renal disease  negative genitourinary   Musculoskeletal negative musculoskeletal ROS (+)   Abdominal   Peds negative pediatric ROS (+)  Hematology negative hematology ROS (+)   Anesthesia Other Findings   Reproductive/Obstetrics negative OB ROS                        Anesthesia Physical Anesthesia Plan  ASA: IV and emergent  Anesthesia Plan: General   Post-op Pain Management:    Induction: Intravenous  Airway Management Planned: Oral ETT  Additional Equipment:   Intra-op Plan:   Post-operative Plan: Extubation in OR  Informed Consent: I have reviewed the patients History and Physical, chart, labs and discussed the procedure including the risks, benefits and alternatives for the proposed anesthesia with the patient or authorized representative who has indicated his/her understanding and acceptance.   Dental advisory given  Plan Discussed with: CRNA  Anesthesia Plan Comments:        Anesthesia Quick Evaluation

## 2015-04-07 NOTE — Discharge Instructions (Signed)
WBAT on the right LE, up only with assistance.  HIGH FALL RISK  Keep incisions clean and dry for one week, then ok to get wet in the shower.  Follow up with Dr Veverly Fells in two weeks in the office  [phone]

## 2015-04-07 NOTE — ED Notes (Signed)
Patient transported to X-ray 

## 2015-04-07 NOTE — Brief Op Note (Signed)
04/07/2015  10:20 PM  PATIENT:  Troy Warren  81 y.o. male  PRE-OPERATIVE DIAGNOSIS:  RIGHT HIP FRACTURE, DISPLACED INTERTROCHANTERIC FRACTURE  POST-OPERATIVE DIAGNOSIS:  RIGHT HIP FRACTURE, DISPLACED INTERTROCHANTERIC FRACTURE  PROCEDURE:  Procedure(s): INTRAMEDULLARY (IM) NAIL INTERTROCHANTRIC (Right),  Biomet Affixis short nail  SURGEON:  Surgeon(s) and Role:    * Netta Cedars, MD - Primary  PHYSICIAN ASSISTANT:   ASSISTANTS: Ventura Bruns, PA-C   ANESTHESIA:   general  EBL:  Total I/O In: 500 [I.V.:500] Out: 500 [Urine:400; Blood:100]  BLOOD ADMINISTERED:none  DRAINS: none   LOCAL MEDICATIONS USED:  NONE  SPECIMEN:  No Specimen  DISPOSITION OF SPECIMEN:  N/A  COUNTS:  YES  TOURNIQUET:  * No tourniquets in log *  DICTATION: .Other Dictation: Dictation Number [id number]  PLAN OF CARE: Admit to inpatient   PATIENT DISPOSITION:  PACU - hemodynamically stable.   Delay start of Pharmacological VTE agent (>24hrs) due to surgical blood loss or risk of bleeding: no

## 2015-04-07 NOTE — Transfer of Care (Signed)
Immediate Anesthesia Transfer of Care Note  Patient: Troy Warren  Procedure(s) Performed: Procedure(s): INTRAMEDULLARY (IM) NAIL INTERTROCHANTRIC (Right)  Patient Location: PACU  Anesthesia Type:General  Level of Consciousness: awake, sedated and patient cooperative  Airway & Oxygen Therapy: Patient Spontanous Breathing and Patient connected to nasal cannula oxygen  Post-op Assessment: Report given to RN and Post -op Vital signs reviewed and stable  Post vital signs: Reviewed and stable  Last Vitals:  Filed Vitals:   04/07/15 1900  BP: 132/78  Pulse: 81  Resp: 21    Complications: No apparent anesthesia complications

## 2015-04-07 NOTE — ED Provider Notes (Signed)
CSN: [id number]     Arrival date & time 04/07/15  1535 History   First MD Initiated Contact with Patient 04/07/15 1541     Chief Complaint  Patient presents with  . Fall     (Consider location/radiation/quality/duration/timing/severity/associated sxs/prior Treatment) HPI Comments: LEVEL 5 CAVEAT FOR SEVERE DEMENTIA Pt with hx of afib, CAD, AFIB with AICD placement and dementia comes in from a nursing home with cc of fall. Pt's wife at bedside, and provides all the history, as pt is unaware as to the reason he is in the ER. She reports that pt had a fall yday, and he started having pain today and was not ambulating. There is no nausea, emesis, fevers, chills. Pt is not on blood thinner.  Patient is a 81 y.o. male presenting with fall. The history is provided by the patient.  Fall    Past Medical History  Diagnosis Date  . Hypertension   . Ischemic heart disease   . Hyperlipemia   . Depression   . Testicular cancer   . Polio     child polio  . Rheumatic fever   . Mild dementia   . Mild dementia   . Systolic heart failure     acute on chronic  . CAD (coronary artery disease)   . Chronic atrial fibrillation   . Vitamin D deficiency   . Type II or unspecified type diabetes mellitus without mention of complication, not stated as uncontrolled   . Other testicular hypofunction   . Permanent atrial fibrillation 01/21/2014  . ICD, biventricular, in situ - Medtronic Protecta 2013, leads 2009 01/21/2014    New Generator Medtronic [place] model number [id number], serial number [id number]  Right atrial lead (chronic) Medtronic, model number [id number], serial [id number] (implanted 05/01/2008)  Right ventricular lead (chronic) Medtronic, model number [id number], serial number [id number] (implanted 05/01/2008)  Left ventricular lead Medtronic, model number [id number], serial number [id number] (implanted 05/01/2008)  Exp  . Pleural effusion, right 03/18/2014  . Emphysema 03/18/2014  . Pulmonary infiltrate 05/10/2014     CT chest 03/2014:  RUL interstitial/GGO    Past Surgical History  Procedure Laterality Date  . Coronary artery bypass graft  06/26/2002    LIMA to diagonal artery & SVG to the first obtuse margin  . Nm myocar perf wall motion  09/15/2006    Dilated LV w/small area of possible nontransmural inferior scar w/mild perinfarct ischemia.  No significant change from previous study.  . Cardiac defibrillator placement  05/01/2008    Medtronic  . Biv icd genertaor change out N/A 10/27/2012    Procedure: BIV ICD GENERTAOR CHANGE OUT;  Surgeon: Sanda Klein, MD;  Location: [facility] CATH LAB;  Service: Cardiovascular;  Laterality: N/A;   Family History  Problem Relation Age of Onset  . Other Mother   . Heart attack Father    History  Substance Use Topics  . Smoking status: Former Smoker    Quit date: 12/24/1968  . Smokeless tobacco: Never Used  . Alcohol Use: No    Review of Systems  Unable to perform ROS: Dementia      Allergies  Lipitor  Home Medications   Prior to Admission medications   Medication Sig Start Date End Date Taking? Authorizing Provider  ALPRAZolam (XANAX) 0.25 MG tablet Take 0.125 mg by mouth at bedtime.   Yes Historical Provider, MD  aspirin 81 MG chewable tablet Chew 81 mg by mouth daily.   Yes Historical Provider, MD  Cholecalciferol (VITAMIN D3) 2000 UNITS TABS  Take 2 tablets by mouth daily.    Yes Historical Provider, MD  citalopram (CELEXA) 40 MG tablet Take 0.5 tablets (20 mg total) by mouth daily. 11/05/14  Yes Gerlene Fee, NP  Cyanocobalamin (VITAMIN B-12 PO) Take 1,000 mcg by mouth daily.    Yes Historical Provider, MD  diltiazem (CARDIZEM) 120 MG tablet Take 1 tablet (120 mg total) by mouth 3 (three) times daily. 01/21/14  Yes Mihai Croitoru, MD  donepezil (ARICEPT) 23 MG TABS tablet Take 1 tablet (23 mg total) by mouth at bedtime. 10/07/14  Yes Garvin Fila, MD  dorzolamide-timolol (COSOPT) 22.3-6.8 MG/ML ophthalmic solution Place 1 drop into both eyes 2  (two) times daily.  02/15/13  Yes Historical Provider, MD  ferrous fumarate (HEMOCYTE - 106 MG FE) 325 (106 FE) MG TABS Take 1 tablet by mouth 2 (two) times daily.   Yes Historical Provider, MD  furosemide (LASIX) 40 MG tablet Take 1 tablet (40 mg total) by mouth daily. 04/24/14  Yes Mihai Croitoru, MD  insulin detemir (LEVEMIR) 100 UNIT/ML injection Inject 10 Units into the skin at bedtime.    Yes Historical Provider, MD  isosorbide mononitrate (IMDUR) 60 MG 24 hr tablet Take 90 mg by mouth daily. Pt takes 1and 1/2 of the 60 mg pill to equal 90 mg.   Yes Historical Provider, MD  KLOR-CON M10 10 MEQ tablet TAKE 1 TABLET TWICE DAILY 01/22/15  Yes Unk Pinto, MD  labetalol (NORMODYNE) 200 MG tablet TAKE 1 TABLET TWICE A DAY 08/25/14 08/26/15 Yes Unk Pinto, MD  levothyroxine (SYNTHROID, LEVOTHROID) 75 MCG tablet TAKE 1 TABLET EVERY DAY 10/29/13  Yes Melissa Smith, PA-C  linagliptin (TRADJENTA) 5 MG TABS tablet Take 5 mg by mouth daily.   Yes Historical Provider, MD  lisinopril (PRINIVIL,ZESTRIL) 10 MG tablet Take 10 mg by mouth daily.   Yes Historical Provider, MD  loratadine (CLARITIN) 10 MG tablet Take 10 mg by mouth daily.   Yes Historical Provider, MD  Olopatadine HCl 0.2 % SOLN Place 1 drop into both eyes 2 (two) times daily.   Yes Historical Provider, MD  polyethylene glycol (MIRALAX / GLYCOLAX) packet Take 17 g by mouth daily.   Yes Historical Provider, MD  pravastatin (PRAVACHOL) 40 MG tablet TAKE 1 TABLET EVERY DAY FOR CHOLESTEROL 08/23/14  Yes Unk Pinto, MD  insulin lispro (HUMALOG) 100 UNIT/ML injection Inject 5-10 Units into the skin 3 (three) times daily before meals. 5 units for cbg 250-350 8 units for cbg >350-450 10 units for cbg>450    Historical Provider, MD   BP 149/71 mmHg  Pulse 80  Resp 27  SpO2 94% Physical Exam  Constitutional: He appears well-developed.  Eyes: Conjunctivae are normal.  Neck: Neck supple.  No midline c-spine tenderness, pt able to turn head to  45 degrees bilaterally without any pain and able to flex neck to the chest and extend without any pain or neurologic symptoms.   Cardiovascular: Normal rate.   Pulmonary/Chest: Effort normal.  Abdominal: Soft. He exhibits no distension. There is no tenderness.  Musculoskeletal:  RIGHT PELVIC AREA IS EDEMATOUS AND has TENDERNESS.  Head to toe evaluation shows no hematoma, bleeding of the scalp, no facial abrasions, step offs, crepitus, no tenderness to palpation of the bilateral upper and lower extremities, no gross deformities, no chest tenderness, no pelvic pain.    Neurological: He is alert.  Nursing note and vitals reviewed.   ED Course  Procedures (including critical care time) Labs Review Labs Reviewed  CBG MONITORING, ED - Abnormal; Notable for the following:    Glucose-Capillary 299 (*)    All other components within normal limits  CBC WITH DIFFERENTIAL/PLATELET  BASIC METABOLIC PANEL  PROTIME-INR  APTT  URINALYSIS, ROUTINE W REFLEX MICROSCOPIC  I-STAT CHEM 8, ED  TYPE AND SCREEN    Imaging Review Dg Chest 1 View  04/07/2015   CLINICAL DATA:  Unwitnessed fall, right hip fracture, dementia  EXAM: CHEST  1 VIEW  COMPARISON:  10/24/2014  FINDINGS: Lungs are essentially clear. No frank interstitial edema. No pleural effusion or pneumothorax.  Heart is top-normal in size. Left subclavian ICD. Abandoned right subclavian pacemaker leads. Postsurgical changes related to prior CABG.  Cholecystectomy clips.  IMPRESSION: No evidence of acute cardiopulmonary disease. No frank interstitial edema.   Electronically Signed   By: Julian Hy M.D.   On: 04/07/2015 17:31   Ct Head Wo Contrast  04/07/2015   CLINICAL DATA:  Per ems, pt with unwitnessed fall at 3am, trxfr to ED with known right hip fracture. Baseline is dementia, oriented only to self.  EXAM: CT HEAD WITHOUT CONTRAST  CT CERVICAL SPINE WITHOUT CONTRAST  TECHNIQUE: Multidetector CT imaging of the head and cervical spine was  performed following the standard protocol without intravenous contrast. Multiplanar CT image reconstructions of the cervical spine were also generated.  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 No evidence of a recent cortical infarct.  Ventricles are enlarged, to a greater degree than the sulci, but stable from the prior CT. No convincing hydrocephalus.  No extra-axial masses or abnormal fluid collections.  There is no intracranial hemorrhage.  Mild sinus mucosal thickening mostly along the ethmoid air cells. Clear mastoid air cells no skull fracture.  CT CERVICAL SPINE FINDINGS  No fracture. No spondylolisthesis. Moderate loss of disc height noted at C 6- C7 and C7-T1. Mild loss disc height at C5-C6. Facet degenerative change noted bilaterally. There are multiple levels of mild diffuse spondylotic disc bulging and at least mild degrees of neural foraminal narrowing.  Bones are demineralized.  Soft tissues show carotid vascular calcifications, but no masses or enlarged lymph nodes. Lung apices are clear.  IMPRESSION: HEAD CT: No acute intracranial abnormalities. Stable appearance from the prior exam.  CERVICAL CT:  No fracture or acute finding.   Electronically Signed   By: Lajean Manes M.D.   On: 04/07/2015 17:10   Ct Cervical Spine Wo Contrast  04/07/2015   CLINICAL DATA:  Per ems, pt with unwitnessed fall at 3am, trxfr to ED with known right hip fracture. Baseline is dementia, oriented only to self.  EXAM: CT HEAD WITHOUT CONTRAST  CT CERVICAL SPINE WITHOUT CONTRAST  TECHNIQUE: Multidetector CT imaging of the head and cervical spine was performed following the standard protocol without intravenous contrast. Multiplanar CT image reconstructions of the cervical spine were also generated.  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 No evidence of a recent cortical infarct.  Ventricles are enlarged, to a greater degree than the sulci, but stable from the prior CT. No convincing hydrocephalus.  No extra-axial masses or abnormal fluid collections.  There is no intracranial hemorrhage.  Mild sinus  mucosal thickening mostly along the ethmoid air cells. Clear mastoid air cells no skull fracture.  CT CERVICAL SPINE FINDINGS  No fracture. No spondylolisthesis. Moderate loss of disc height noted at C 6- C7 and C7-T1. Mild loss disc height at C5-C6. Facet degenerative change noted bilaterally. There are multiple levels of mild diffuse spondylotic disc bulging and at least mild degrees of neural foraminal narrowing.  Bones are demineralized.  Soft tissues show carotid vascular calcifications, but no masses or enlarged lymph nodes. Lung apices are clear.  IMPRESSION: HEAD CT: No acute intracranial abnormalities. Stable appearance from the prior exam.  CERVICAL CT:  No fracture or acute finding.   Electronically Signed   By: Lajean Manes M.D.   On: 04/07/2015 17:10   Dg Hips Bilat With Pelvis 3-4 Views  04/07/2015   CLINICAL DATA:  Unwitnessed fall, now transferred to the emergency department with known right hip fracture. Initial encounter.  EXAM: BILATERAL HIP (WITH PELVIS) 3-4 VIEWS  COMPARISON:  None.  FINDINGS: There is a comminuted minimally displaced right-sided intertrochanteric femur fracture with minimal foreshortening and varus angulation. No definite intra-articular extension. A prominent (approximately 1.2 cm) presumed bone island is noted within the proximal femoral diaphysis.  No left-sided hip fracture. Mild-to-moderate degenerative change of the left hip with joint space loss, subchondral sclerosis and osteophytosis. No evidence  of avascular necrosis. Note is made of a punctate (approximately 1 cm) presumed bone island within the left acetabulum as well as a smaller (approximately 5 mm) suspected bone island within the left femoral head.  No definite displaced pelvic fracture.  Vascular calcifications are noted within the pelvis and upper aspects of the thigh bilaterally. No radiopaque foreign body.  IMPRESSION: 1. Acute minimally displaced right-sided intertrochanteric femur fracture without definite intra-articular extension. 2. No left-sided femur fracture.   Electronically Signed   By: Sandi Mariscal M.D.   On: 04/07/2015 17:29     EKG Interpretation   Date/Time:  Monday April 07 2015 15:38:36 EDT Ventricular Rate:  81 PR Interval:    QRS Duration: 197 QT Interval:  472 QTC Calculation: 548 R Axis:   -133 Text Interpretation:  Atrial fibrillation Paired ventricular premature  complexes Right bundle branch block Repol abnrm, prob ischemia,  anterolateral lds No significant change since last tracing Confirmed by  Kathrynn Humble, MD, [facility] Comp [phone]) on 04/07/2015 5:14:30 PM      MDM   Final diagnoses:  Fall  Closed right hip fracture, initial encounter    Pt comes in with fall. He has right hip swelling, and the Xray confirmed a small displaced Rt femur fracture. Pt is neurovascularly intact. The CT head and Cspine are neg. We had placed a c-collar around patient's neck - but he kept removing it. He has no neck tenderness, step offs and is noted to be moving his neck left and right w/o any trouble- will clinically clear the spine.  Spoke with Dr. Veverly Fells, Orthopedics. RN to find out last meal time, pt might go to the OR this evening if possible. Labs pending.     Varney Biles, MD 04/07/15 [phone]

## 2015-04-07 NOTE — H&P (Addendum)
[facility] History and Physical  Troy Warren ACZ:[id number] DOB: 1931/05/17 DOA: 04/07/2015  Referring physician: Varney Biles, MD PCP: Alesia Richards, MD   Chief Complaint: Hip Fracture  HPI: Troy Warren is a 81 y.o. male presents with a broken hip. The family states that he fell last night and scraped his elbow. Patients famikly states that they received a call last night about this. This morning the facility tried to give him a bath and he was not able to bear weight on the right side. The facility did an X ray and this revealed a fracture of the right hip. Patient currently is comfortable and has no specific complaints. He does have a history of Systolic CHF with an EF of 25-30% and has DM II with renal manifestations. Also has got HTN and CKD III. Patient also has a history of dementia and is a resident of a SNF.   Review of Systems:  Patient is not able to provide a ROS on account of his dementia.  Past Medical History  Diagnosis Date  . Hypertension   . Ischemic heart disease   . Hyperlipemia   . Depression   . Testicular cancer   . Polio     child polio  . Rheumatic fever   . Mild dementia   . Mild dementia   . Systolic heart failure     acute on chronic  . CAD (coronary artery disease)   . Chronic atrial fibrillation   . Vitamin D deficiency   . Type II or unspecified type diabetes mellitus without mention of complication, not stated as uncontrolled   . Other testicular hypofunction   . Permanent atrial fibrillation 01/21/2014  . ICD, biventricular, in situ - Medtronic Protecta 2013, leads 2009 01/21/2014    New Generator Medtronic [place] model number [id number], serial number [id number]  Right atrial lead (chronic) Medtronic, model number [id number], serial [id number] (implanted 05/01/2008)  Right ventricular lead (chronic) Medtronic, model number [id number], serial number [id number] (implanted 05/01/2008)  Left ventricular lead Medtronic, model number  [id number], serial number [id number] (implanted 05/01/2008)  Exp  . Pleural effusion, right 03/18/2014  . Emphysema 03/18/2014  . Pulmonary infiltrate 05/10/2014    CT chest 03/2014:  RUL interstitial/GGO    Past Surgical History  Procedure Laterality Date  . Coronary artery bypass graft  06/26/2002    LIMA to diagonal artery & SVG to the first obtuse margin  . Nm myocar perf wall motion  09/15/2006    Dilated LV w/small area of possible nontransmural inferior scar w/mild perinfarct ischemia.  No significant change from previous study.  . Cardiac defibrillator placement  05/01/2008    Medtronic  . Biv icd genertaor change out N/A 10/27/2012    Procedure: BIV ICD GENERTAOR CHANGE OUT;  Surgeon: Sanda Klein, MD;  Location: [facility] CATH LAB;  Service: Cardiovascular;  Laterality: N/A;   Social History:  reports that he quit smoking about 46 years ago. He has never used smokeless tobacco. He reports that he does not drink alcohol or use illicit drugs.  Allergies  Allergen Reactions  . Lipitor [Atorvastatin]     weakness    Family History  Problem Relation Age of Onset  . Other Mother   . Heart attack Father      Prior to Admission medications   Medication Sig Start Date End Date Taking? Authorizing Provider  ALPRAZolam (XANAX) 0.25 MG tablet Take 0.125 mg by mouth at bedtime.   Yes Historical Provider, MD  aspirin 81  MG chewable tablet Chew 81 mg by mouth daily.   Yes Historical Provider, MD  Cholecalciferol (VITAMIN D3) 2000 UNITS TABS Take 2 tablets by mouth daily.    Yes Historical Provider, MD  citalopram (CELEXA) 40 MG tablet Take 0.5 tablets (20 mg total) by mouth daily. 11/05/14  Yes Gerlene Fee, NP  Cyanocobalamin (VITAMIN B-12 PO) Take 1,000 mcg by mouth daily.    Yes Historical Provider, MD  diltiazem (CARDIZEM) 120 MG tablet Take 1 tablet (120 mg total) by mouth 3 (three) times daily. 01/21/14  Yes Mihai Croitoru, MD  donepezil (ARICEPT) 23 MG TABS tablet Take 1 tablet (23 mg total) by  mouth at bedtime. 10/07/14  Yes Garvin Fila, MD  dorzolamide-timolol (COSOPT) 22.3-6.8 MG/ML ophthalmic solution Place 1 drop into both eyes 2 (two) times daily.  02/15/13  Yes Historical Provider, MD  ferrous fumarate (HEMOCYTE - 106 MG FE) 325 (106 FE) MG TABS Take 1 tablet by mouth 2 (two) times daily.   Yes Historical Provider, MD  furosemide (LASIX) 40 MG tablet Take 1 tablet (40 mg total) by mouth daily. 04/24/14  Yes Mihai Croitoru, MD  insulin detemir (LEVEMIR) 100 UNIT/ML injection Inject 10 Units into the skin at bedtime.    Yes Historical Provider, MD  isosorbide mononitrate (IMDUR) 60 MG 24 hr tablet Take 90 mg by mouth daily. Pt takes 1and 1/2 of the 60 mg pill to equal 90 mg.   Yes Historical Provider, MD  KLOR-CON M10 10 MEQ tablet TAKE 1 TABLET TWICE DAILY 01/22/15  Yes Unk Pinto, MD  labetalol (NORMODYNE) 200 MG tablet TAKE 1 TABLET TWICE A DAY 08/25/14 08/26/15 Yes Unk Pinto, MD  levothyroxine (SYNTHROID, LEVOTHROID) 75 MCG tablet TAKE 1 TABLET EVERY DAY 10/29/13  Yes Melissa Smith, PA-C  linagliptin (TRADJENTA) 5 MG TABS tablet Take 5 mg by mouth daily.   Yes Historical Provider, MD  lisinopril (PRINIVIL,ZESTRIL) 10 MG tablet Take 10 mg by mouth daily.   Yes Historical Provider, MD  loratadine (CLARITIN) 10 MG tablet Take 10 mg by mouth daily.   Yes Historical Provider, MD  Olopatadine HCl 0.2 % SOLN Place 1 drop into both eyes 2 (two) times daily.   Yes Historical Provider, MD  polyethylene glycol (MIRALAX / GLYCOLAX) packet Take 17 g by mouth daily.   Yes Historical Provider, MD  pravastatin (PRAVACHOL) 40 MG tablet TAKE 1 TABLET EVERY DAY FOR CHOLESTEROL 08/23/14  Yes Unk Pinto, MD  insulin lispro (HUMALOG) 100 UNIT/ML injection Inject 5-10 Units into the skin 3 (three) times daily before meals. 5 units for cbg 250-350 8 units for cbg >350-450 10 units for cbg>450    Historical Provider, MD   Physical Exam: Filed Vitals:   04/07/15 1815 04/07/15 1830 04/07/15  1831 04/07/15 1832  BP: 150/57 149/71    Pulse: 77 80    Resp: 23 27    SpO2: 88% 91% 88% 94%    Wt Readings from Last 3 Encounters:  04/07/15 73.211 kg (161 lb 6.4 oz)  03/06/15 72.576 kg (160 lb)  02/06/15 72.576 kg (160 lb)    General:  Appears calm and comfortable Eyes: PERRL, normal lids, irises & conjunctiva ENT: grossly normal hearing, lips & tongue Neck: no LAD, masses or thyromegaly Cardiovascular: RRR, no m/r/g. No LE edema. Respiratory: CTA bilaterally, no w/r/r. Normal respiratory effort. Abdomen: soft, ntnd Skin: no rash or induration seen on limited exam Musculoskeletal: grossly normal tone BUE/BLE Psychiatric: grossly normal mood and affect, speech fluent and  appropriate Neurologic: grossly non-focal.          Labs on Admission:  Basic Metabolic Panel:  Recent Labs Lab 04/07/15 1853  NA 138  K 4.0  CL 104  GLUCOSE 347*  BUN 21  CREATININE 1.10   Liver Function Tests: No results for input(s): AST, ALT, ALKPHOS, BILITOT, PROT, ALBUMIN in the last 168 hours. No results for input(s): LIPASE, AMYLASE in the last 168 hours. No results for input(s): AMMONIA in the last 168 hours. CBC:  Recent Labs Lab 04/07/15 1832 04/07/15 1853  WBC 11.6*  --   NEUTROABS 9.6*  --   HGB 12.7* 12.9*  HCT 38.0* 38.0*  MCV 85.6  --   PLT PENDING  --    Cardiac Enzymes: No results for input(s): CKTOTAL, CKMB, CKMBINDEX, TROPONINI in the last 168 hours.  BNP (last 3 results) No results for input(s): BNP in the last 8760 hours.  ProBNP (last 3 results)  Recent Labs  10/24/14 0845  PROBNP 2991.0*    CBG:  Recent Labs Lab 04/07/15 1624  GLUCAP 299*    Radiological Exams on Admission: Dg Chest 1 View  04/07/2015   CLINICAL DATA:  Unwitnessed fall, right hip fracture, dementia  EXAM: CHEST  1 VIEW  COMPARISON:  10/24/2014  FINDINGS: Lungs are essentially clear. No frank interstitial edema. No pleural effusion or pneumothorax.  Heart is top-normal in size.  Left subclavian ICD. Abandoned right subclavian pacemaker leads. Postsurgical changes related to prior CABG.  Cholecystectomy clips.  IMPRESSION: No evidence of acute cardiopulmonary disease. No frank interstitial edema.   Electronically Signed   By: Julian Hy M.D.   On: 04/07/2015 17:31   Ct Head Wo Contrast  04/07/2015   CLINICAL DATA:  Per ems, pt with unwitnessed fall at 3am, trxfr to ED with known right hip fracture. Baseline is dementia, oriented only to self.  EXAM: CT HEAD WITHOUT CONTRAST  CT CERVICAL SPINE WITHOUT CONTRAST  TECHNIQUE: Multidetector CT imaging of the head and cervical spine was performed following the standard protocol without intravenous contrast. Multiplanar CT image reconstructions of the cervical spine were also generated.  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 No evidence of a recent cortical infarct.  Ventricles are enlarged, to a greater degree than the sulci, but stable from the prior CT. No convincing hydrocephalus.  No extra-axial masses or abnormal fluid collections.  There is no intracranial hemorrhage.  Mild sinus mucosal thickening mostly along the ethmoid air cells. Clear mastoid air cells no skull fracture.  CT CERVICAL SPINE FINDINGS  No fracture. No spondylolisthesis. Moderate loss of disc height noted at C 6- C7 and C7-T1. Mild loss disc height at C5-C6. Facet degenerative change noted bilaterally. There are multiple levels of mild diffuse spondylotic disc bulging and at least mild degrees of neural foraminal narrowing.  Bones are demineralized.  Soft tissues show carotid vascular calcifications, but no masses or enlarged lymph nodes. Lung apices are clear.  IMPRESSION: HEAD CT: No acute intracranial abnormalities. Stable appearance from the prior exam.  CERVICAL CT:   No fracture or acute finding.   Electronically Signed   By: Lajean Manes M.D.   On: 04/07/2015 17:10   Ct Cervical Spine Wo Contrast  04/07/2015   CLINICAL DATA:  Per ems, pt with unwitnessed fall at 3am, trxfr to ED  with known right hip fracture. Baseline is dementia, oriented only to self.  EXAM: CT HEAD WITHOUT CONTRAST  CT CERVICAL SPINE WITHOUT CONTRAST  TECHNIQUE: Multidetector CT imaging of the head and cervical spine was performed following the standard protocol without intravenous contrast. Multiplanar CT image reconstructions of the cervical spine were also generated.  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 No evidence of a recent cortical infarct.  Ventricles are enlarged, to a greater degree than the sulci, but stable from the prior CT. No convincing hydrocephalus.  No extra-axial masses or abnormal fluid collections.  There is no intracranial hemorrhage.  Mild sinus mucosal thickening mostly along the ethmoid air cells. Clear mastoid air cells no skull fracture.  CT CERVICAL SPINE FINDINGS  No fracture. No spondylolisthesis. Moderate loss of disc height noted at C 6- C7 and C7-T1. Mild loss disc height at C5-C6. Facet degenerative change noted bilaterally. There are multiple levels of mild diffuse spondylotic disc bulging and at least mild degrees of neural foraminal narrowing.  Bones are demineralized.  Soft tissues show carotid vascular calcifications, but no masses or enlarged lymph nodes. Lung apices are clear.  IMPRESSION: HEAD CT: No acute intracranial abnormalities. Stable appearance from the prior exam.  CERVICAL CT:  No fracture or acute finding.   Electronically Signed   By: Lajean Manes M.D.   On: 04/07/2015 17:10   Dg Hips Bilat With Pelvis 3-4 Views  04/07/2015   CLINICAL DATA:  Unwitnessed  fall, now transferred to the emergency department with known right hip fracture. Initial encounter.  EXAM: BILATERAL HIP (WITH PELVIS) 3-4 VIEWS  COMPARISON:  None.  FINDINGS: There is a comminuted minimally displaced right-sided intertrochanteric femur fracture with minimal foreshortening and varus angulation. No definite intra-articular extension. A prominent (approximately 1.2 cm) presumed bone island is noted within the proximal femoral diaphysis.  No left-sided hip fracture. Mild-to-moderate degenerative change of the left hip with joint space loss, subchondral sclerosis and osteophytosis. No evidence of avascular necrosis. Note is made of a punctate (approximately 1 cm) presumed bone island within the left acetabulum as well as a smaller (approximately 5 mm) suspected bone island within the left femoral head.  No definite displaced pelvic fracture.  Vascular calcifications are noted within the pelvis and upper aspects of the thigh bilaterally. No radiopaque foreign body.  IMPRESSION: 1. Acute minimally displaced right-sided intertrochanteric femur fracture without definite intra-articular extension. 2. No left-sided femur fracture.   Electronically Signed   By: Sandi Mariscal M.D.   On: 04/07/2015 17:29      Assessment/Plan Principal Problem:   Closed intratrochanteric fracture of right femur Active Problems:   Permanent atrial fibrillation   Hypothyroidism   Chronic kidney disease, stage III (moderate)   Hypertension associated with stage 3 chronic kidney disease due to type 2 diabetes mellitus   DM (diabetes mellitus) type II controlled with renal manifestation   Chronic systolic congestive heart failure   Closed right hip fracture   Closed intertrochanteric fracture of right femur   1. Closed Right Intertrochanteric Femur fracture -consult orhtopedics -possible surgery tonight -pain control SCDs applied -social services consult  2. CKD III -keep adequately hydrated -monitor  labs  3. HTN associated with CKD III -monitor pressures -will continue with antihypertensives  4. DM II with CKD III -constinue with homoe insulin -Check A1C -  will provide insulind sliding scale  5. Chronic Systolic Heart Failure -continue diuretics -monitor IOs -last Echo in April with EF 25-30%  6. Hypothyroid -will check TSH -continue with home medications  7. Normocytic Anemia -will check iron studies  8. Atrial fibrillation -rate controlled -will monitor  Code Status: Full Code (must indicate code status--if unknown or must be presumed, indicate so) DVT Prophylaxis:SCD Family Communication: Wife (indicate person spoken with, if applicable, with phone number if by telephone) Disposition Plan: SNF (indicate anticipated LOS)  Time spent: 32min  KHAN,SAADAT A [facility] Pager [phone]

## 2015-04-07 NOTE — Progress Notes (Signed)
Patient ID: Troy Warren, male   DOB: 29-Jan-1931, 81 y.o.   MRN: [id number]   Place of Service: [facility]  Allergies  Allergen Reactions  . Lipitor [Atorvastatin]     weakness    Code Status: Full Code  Goals of Care: Longevity/LTC?  Chief Complaint  Patient presents with  . Medical Management of Chronic Issues    HTN, dementia, afib, CHF, hypothyroidism, DM2  . Acute Visit    fall    HPI 81 y.o. male with PMH of afib, HTN, hypothyroidism, depression w/ anxiety, mixed AD and vascular dementia, CHF, DM2 among others is being seen for a routine visit for management of his chronic issues. Weight stable over the past 30 days. Per nursing staff, patient was found sitting on floor yesterday-no injuries reported and patient denied any pain/discomfort after the fall. With regards to his chronic issues: BP range 110-160s/60-70s with most reading in 130s/60s.CBG labile ranging from 60s to 320s.  No recent CHF exacerbations. Hypothyroidism stable on levothyroxine. Mood stable on celexa and xanax. Afib is stable on cardizem and labetalol.  Anemia stable on iron supplement. Seen in room today. Reported having pain/"sore" in right hip/thigh pain. Per NA, patient was moaning in pain with repositioning during his bath today. He usually gets out of bed (ambulates with walker) by this time of the day, but has not done so.   Review of Systems Constitutional: Negative for fever and chills HENT: Negative for ear pain and sore throat Eyes: Negative for eye pain, eye discharge, redness, or itchiness Cardiovascular: Negative for chest pain, palpitations, and leg swelling Respiratory: Negative cough, shortness of breath, and wheezing.  Gastrointestinal: Negative for nausea and vomiting. Negative for abdominal pain Genitourinary: Negative for dysuria Musculoskeletal: See HPI Neurological: Negative for dizziness and headache Skin: Negative for rash and wound.   Psychiatric: Negative for  depression  Past Medical History  Diagnosis Date  . Hypertension   . Ischemic heart disease   . Hyperlipemia   . Depression   . Testicular cancer   . Polio     child polio  . Rheumatic fever   . Mild dementia   . Mild dementia   . Systolic heart failure     acute on chronic  . CAD (coronary artery disease)   . Chronic atrial fibrillation   . Vitamin D deficiency   . Type II or unspecified type diabetes mellitus without mention of complication, not stated as uncontrolled   . Other testicular hypofunction   . Permanent atrial fibrillation 01/21/2014  . ICD, biventricular, in situ - Medtronic Protecta 2013, leads 2009 01/21/2014    New Generator Medtronic [place] model number [id number], serial number [id number]  Right atrial lead (chronic) Medtronic, model number [id number], serial [id number] (implanted 05/01/2008)  Right ventricular lead (chronic) Medtronic, model number [id number], serial number [id number] (implanted 05/01/2008)  Left ventricular lead Medtronic, model number [id number], serial number [id number] (implanted 05/01/2008)  Exp  . Pleural effusion, right 03/18/2014  . Emphysema 03/18/2014  . Pulmonary infiltrate 05/10/2014    CT chest 03/2014:  RUL interstitial/GGO     Past Surgical History  Procedure Laterality Date  . Coronary artery bypass graft  06/26/2002    LIMA to diagonal artery & SVG to the first obtuse margin  . Nm myocar perf wall motion  09/15/2006    Dilated LV w/small area of possible nontransmural inferior scar w/mild perinfarct ischemia.  No significant change from previous study.  . Cardiac defibrillator placement  05/01/2008  Medtronic  . Biv icd genertaor change out N/A 10/27/2012    Procedure: BIV ICD GENERTAOR CHANGE OUT;  Surgeon: Sanda Klein, MD;  Location: [facility] CATH LAB;  Service: Cardiovascular;  Laterality: N/A;    History   Social History  . Marital Status: Married    Spouse Name: Judeth Porch  . Number of Children: 0  . Years of Education: 8th   Occupational  History  . retired    Social History Main Topics  . Smoking status: Former Smoker    Quit date: 12/24/1968  . Smokeless tobacco: Never Used  . Alcohol Use: No  . Drug Use: No  . Sexual Activity: No   Other Topics Concern  . Not on file   Social History Narrative   Patient lives at home with spouse.   Patient has 8th grade education   Patient is right handed.   Caffeine Use: Rarely    Family History  Problem Relation Age of Onset  . Other Mother   . Heart attack Father       Medication List       This list is accurate as of: 04/07/15  2:59 PM.  Always use your most recent med list.               ALPRAZolam 0.25 MG tablet  Commonly known as:  XANAX  Take 0.125 mg by mouth at bedtime.     aspirin 81 MG chewable tablet  Chew 81 mg by mouth daily.     citalopram 40 MG tablet  Commonly known as:  CELEXA  Take 0.5 tablets (20 mg total) by mouth daily.     diltiazem 120 MG tablet  Commonly known as:  CARDIZEM  Take 1 tablet (120 mg total) by mouth 3 (three) times daily.     donepezil 23 MG Tabs tablet  Commonly known as:  ARICEPT  Take 1 tablet (23 mg total) by mouth at bedtime.     dorzolamide-timolol 22.3-6.8 MG/ML ophthalmic solution  Commonly known as:  COSOPT  Place 1 drop into both eyes 2 (two) times daily.     ferrous fumarate 325 (106 FE) MG Tabs tablet  Commonly known as:  HEMOCYTE - 106 mg FE  Take 1 tablet by mouth 2 (two) times daily.     furosemide 40 MG tablet  Commonly known as:  LASIX  Take 1 tablet (40 mg total) by mouth daily.     insulin detemir 100 UNIT/ML injection  Commonly known as:  LEVEMIR  Inject 10 Units into the skin at bedtime.     insulin lispro 100 UNIT/ML injection  Commonly known as:  HUMALOG  - Inject 5-10 Units into the skin 3 (three) times daily before meals. 5 units for cbg 250-350  - 8 units for cbg >350-450  - 10 units for cbg>450     isosorbide mononitrate 60 MG 24 hr tablet  Commonly known as:  IMDUR    Take 90 mg by mouth daily. Pt takes 1and 1/2 of the 60 mg pill to equal 90 mg.     KLOR-CON M10 10 MEQ tablet  Generic drug:  potassium chloride  TAKE 1 TABLET TWICE DAILY     labetalol 200 MG tablet  Commonly known as:  NORMODYNE  TAKE 1 TABLET TWICE A DAY     levothyroxine 75 MCG tablet  Commonly known as:  SYNTHROID, LEVOTHROID  TAKE 1 TABLET EVERY DAY     linagliptin 5 MG Tabs tablet  Commonly known  as:  TRADJENTA  Take 5 mg by mouth daily.     lisinopril 10 MG tablet  Commonly known as:  PRINIVIL,ZESTRIL  Take 10 mg by mouth daily.     loratadine 10 MG tablet  Commonly known as:  CLARITIN  Take 10 mg by mouth daily.     Olopatadine HCl 0.2 % Soln  Place 1 drop into both eyes 2 (two) times daily.     polyethylene glycol packet  Commonly known as:  MIRALAX / GLYCOLAX  Take 17 g by mouth daily.     pravastatin 40 MG tablet  Commonly known as:  PRAVACHOL  TAKE 1 TABLET EVERY DAY FOR CHOLESTEROL     VITAMIN B-12 PO  Take 1,000 mcg by mouth daily.     Vitamin D3 2000 UNITS Tabs  Take 2 tablets by mouth daily.        Physical Exam  BP 114/65 mmHg  Pulse 70  Temp(Src) 98.7 F (37.1 C)  Resp 16  Ht 5\' 7"  (1.702 m)  Wt 161 lb 6.4 oz (73.211 kg)  BMI 25.27 kg/m2  Constitutional: frail elderly male in no acute distress. Conversant and pleasant with confusions HEENT: Normocephalic and atraumatic. PERRL. EOM intact. No scleral icterus. No nasal discharge or sinus tenderness. Oral mucosa moist. Posterior pharynx clear of any exudate or lesions.  Neck: Supple and nontender. No lymphadenopathy, masses, or thyromegaly. No JVD or carotid bruits. Cardiac: Normal S1, S2. Irregularly irregular without appreciable murmurs, rubs, or gallops. Distal pulses intact. Trace pitting edema of BLE Lungs: No respiratory distress. Breath sounds clear bilaterally without rales, rhonchi, or wheezes. Abdomen: Audible bowel sounds in all quadrants. Soft, nontender, nondistended.   Musculoskeletal: RLE tender to palpation around hip/thigh area.  Skin: Warm and dry.  Neurological: Alert and oriented to self Psychiatric: Appropriate mood and affect.   Labs Reviewed  CBC Latest Ref Rng 03/10/2015 11/04/2014 10/24/2014  WBC - - 7.2 8.2  Hemoglobin 13.5 - 17.5 g/dL 13.1(A) 13.0(A) 13.6  Hematocrit 41 - 53 % 40(A) 41 42.0  Platelets 150 - 399 K/L 155 152 140(L)    CMP Latest Ref Rng 03/10/2015 11/04/2014 10/24/2014  Glucose 70 - 99 mg/dL - - 102(H)  BUN 4 - 21 mg/dL 16 16 15   Creatinine 0.6 - 1.3 mg/dL 1.0 10.0(A) 0.90  Sodium 137 - 147 mmol/L 140 142 144  Potassium 3.4 - 5.3 mmol/L 4.3 3.6 3.8  Chloride 96 - 112 mEq/L - - 105  CO2 19 - 32 mEq/L - - 27  Calcium 8.4 - 10.5 mg/dL - - 8.7  Total Protein 6.0 - 8.3 g/dL - - 5.9(L)  Total Bilirubin 0.3 - 1.2 mg/dL - - 0.9  Alkaline Phos 39 - 117 U/L - - 68  AST 0 - 37 U/L - - 10  ALT 0 - 53 U/L - - <5    Lab Results  Component Value Date   HGBA1C 8.1* 03/10/2015    Lab Results  Component Value Date   TSH 0.85 11/04/2014    Lipid Panel     Component Value Date/Time   CHOL 116 11/04/2014   TRIG 66 11/04/2014   HDL 49 11/04/2014   CHOLHDL 2.5 08/27/2014 1018   VLDL 17 08/27/2014 1018   LDLCALC 54 11/04/2014   12/17//15: Urine mic/cr: 1020.4  Assessment & Plan 1. DM (diabetes mellitus) type II controlled with renal manifestation Last a1c 8.1. CBG labile ranging from 60s to 320s. continue Levemir 10 units daily and tranjenta 5mg   daily with Humalog TID before meals to: 5 units for cbg 250-350, 8 units for cbg >350-450, and 10 units for cbg>450. Up to date with eye exam 02/17/15. Pending podiatry's exam. Continue to monitor cbg  2. Hypothyroidism, unspecified hypothyroidism type Stable. Continue levothyroxine 54mcg daily and monitor.   3. Mixed Alzheimer's and vascular dementia Stable. Decline anticipated. Continue aricept 23mg  daily at bedtime. Continue assist with ADL and monitor for change in  behaviors. Continue fall and pressure ulcer prophylaxis  4. Chronic systolic congestive heart failure Clinically compensated. EF 25-30% on Echo 03/2014. Continue lasix 40mg  daily with potassium 55meq twice daily, lisinopril 10mg  daily, labetalol 200mg  twice daily, and imdur 90mg  daily. Check cmp. Continue daily weight and monitor his status.   5. Permanent atrial fibrillation S/p ICD placement. Rate-controlled. Continue asa 81mg  daily. Continue cardizem 120mg  three times daily and labetalol 200mg  twice daily. Continue to monitor his status  6. Depression with anxiety Mood stable. Continue celexa 20mg  daily with xanax 0.125mg  daily at bedtime. Continue to monitor for change in mood  7. Essential hypertension BP adequately controlled. Continue linopril 10mg  daily, labetatol 200mg  twice daily, cardizem 120mg  three times daily, and lasix 40mg  daily with potassium supplement 32meq twice daily.   8. Anemia, iron deficiency Stable. Continue iron supplment twice daily.   9. Glaucoma No issues. Continue cosopt gtt in each eye twice daily  10. Constipation Stable. Continue miralax 17g daily and monitor.   11. HLD Last LDL 54. Continue pravachol 40mg  daily and monitor.   76. Fall at nursing nursing home, initial encounter Stat Xray of Right hip/femur to rule out fractures. Xray result showed proximal right femoral fracture. Will send resident to ER for further evaluation and treatment. Wife is aware.       Family/Staff Communication Plan of care discussed with resident, wife, and nursing staff. Resident, wife, and nursing staff verbalized understanding and agree with plan of care. No additional questions or concerns reported.    Arthur Holms, MSN, AGNP-C [facility] [address] [place], [place] [address] [phone] [8am-5pm] After hours: [phone]

## 2015-04-07 NOTE — Consult Note (Signed)
Reason for Consult: LV dysfunction Primary Cardiologist: Dr. Sallyanne Kuster Referring Physician: Dr. Truitt Merle is an 81 y.o. male.  HPI: Mr. Chrles Warren is an 81 yo man with PMH of CAD, ischemic cardiomyopathy with CRT-D last known EF 25-30% 03/2014, permanent atrial fibrillation not on anticoagulation 2/2 previous ICH, hypertension, vascular dementia who was admitted with and now s/p ORIF for right hip fracture s/p IM nail. Cardiology asked to see [name] given history of LV dysfunction and device management. Unable to obtain history from [name] given dementia but he fell last night at his [facility] and scraped his elbow; this morning he was unable to bear weight on the right leg leading to an x-ray of his right hip revealing fracture. [name]' family has already departed. He is only oriented to person so history is performed through chart review only and discussion with Anesthesia and primary RN.   Past Medical History  Diagnosis Date  . Hypertension   . Ischemic heart disease   . Hyperlipemia   . Depression   . Testicular cancer   . Polio     child polio  . Rheumatic fever   . Mild dementia   . Mild dementia   . Systolic heart failure     acute on chronic  . CAD (coronary artery disease)   . Chronic atrial fibrillation   . Vitamin D deficiency   . Type II or unspecified type diabetes mellitus without mention of complication, not stated as uncontrolled   . Other testicular hypofunction   . Permanent atrial fibrillation 01/21/2014  . ICD, biventricular, in situ - Medtronic Protecta 2013, leads 2009 01/21/2014    New Generator Medtronic [place] model number [id number], serial number [id number]  Right atrial lead (chronic) Medtronic, model number [id number], serial [id number] (implanted 05/01/2008)  Right ventricular lead (chronic) Medtronic, model number [id number], serial number [id number] (implanted 05/01/2008)  Left ventricular lead Medtronic, model number [id number], serial  number [id number] (implanted 05/01/2008)  Exp  . Pleural effusion, right 03/18/2014  . Emphysema 03/18/2014  . Pulmonary infiltrate 05/10/2014    CT chest 03/2014:  RUL interstitial/GGO     Past Surgical History  Procedure Laterality Date  . Coronary artery bypass graft  06/26/2002    LIMA to diagonal artery & SVG to the first obtuse margin  . Nm myocar perf wall motion  09/15/2006    Dilated LV w/small area of possible nontransmural inferior scar w/mild perinfarct ischemia.  No significant change from previous study.  . Cardiac defibrillator placement  05/01/2008    Medtronic  . Biv icd genertaor change out N/A 10/27/2012    Procedure: BIV ICD GENERTAOR CHANGE OUT;  Surgeon: Sanda Klein, MD;  Location: [facility] CATH LAB;  Service: Cardiovascular;  Laterality: N/A;    Family History  Problem Relation Age of Onset  . Other Mother   . Heart attack Father     Social History:  reports that he quit smoking about 46 years ago. He has never used smokeless tobacco. He reports that he does not drink alcohol or use illicit drugs.  Allergies:  Allergies  Allergen Reactions  . Lipitor [Atorvastatin]     weakness    Medications:  I have reviewed the patient's current medications. Prior to Admission:  Prescriptions prior to admission  Medication Sig Dispense Refill Last Dose  . ALPRAZolam (XANAX) 0.25 MG tablet Take 0.125 mg by mouth at bedtime.   04/07/2015 at Unknown time  . aspirin 81 MG chewable tablet Chew  81 mg by mouth daily.   04/07/2015 at [place]  . Cholecalciferol (VITAMIN D3) 2000 UNITS TABS Take 2 tablets by mouth daily.    04/06/2015 at Unknown time  . citalopram (CELEXA) 40 MG tablet Take 0.5 tablets (20 mg total) by mouth daily. 90 tablet 3 04/07/2015 at Unknown time  . Cyanocobalamin (VITAMIN B-12 PO) Take 1,000 mcg by mouth daily.    04/06/2015 at Unknown time  . diltiazem (CARDIZEM) 120 MG tablet Take 1 tablet (120 mg total) by mouth 3 (three) times daily. 270 tablet 3 04/07/2015 at Unknown  time  . donepezil (ARICEPT) 23 MG TABS tablet Take 1 tablet (23 mg total) by mouth at bedtime. 60 tablet 1 04/06/2015 at Unknown time  . dorzolamide-timolol (COSOPT) 22.3-6.8 MG/ML ophthalmic solution Place 1 drop into both eyes 2 (two) times daily.    04/07/2015 at Unknown time  . ferrous fumarate (HEMOCYTE - 106 MG FE) 325 (106 FE) MG TABS Take 1 tablet by mouth 2 (two) times daily.   04/07/2015 at Unknown time  . furosemide (LASIX) 40 MG tablet Take 1 tablet (40 mg total) by mouth daily. 30 tablet 9 04/07/2015 at Unknown time  . insulin detemir (LEVEMIR) 100 UNIT/ML injection Inject 10 Units into the skin at bedtime.    04/06/2015 at Unknown time  . isosorbide mononitrate (IMDUR) 60 MG 24 hr tablet Take 90 mg by mouth daily. Pt takes 1and 1/2 of the 60 mg pill to equal 90 mg.   04/07/2015 at Unknown time  . KLOR-CON M10 10 MEQ tablet TAKE 1 TABLET TWICE DAILY 180 tablet 0 04/07/2015 at Unknown time  . labetalol (NORMODYNE) 200 MG tablet TAKE 1 TABLET TWICE A DAY 180 tablet 99 04/07/2015 at 9a  . levothyroxine (SYNTHROID, LEVOTHROID) 75 MCG tablet TAKE 1 TABLET EVERY DAY 90 tablet 3 04/07/2015 at Unknown time  . linagliptin (TRADJENTA) 5 MG TABS tablet Take 5 mg by mouth daily.   04/07/2015 at Unknown time  . lisinopril (PRINIVIL,ZESTRIL) 10 MG tablet Take 10 mg by mouth daily.   04/07/2015 at Unknown time  . loratadine (CLARITIN) 10 MG tablet Take 10 mg by mouth daily.   04/07/2015 at Unknown time  . Olopatadine HCl 0.2 % SOLN Place 1 drop into both eyes 2 (two) times daily.   04/07/2015 at Unknown time  . polyethylene glycol (MIRALAX / GLYCOLAX) packet Take 17 g by mouth daily.   04/07/2015 at Unknown time  . pravastatin (PRAVACHOL) 40 MG tablet TAKE 1 TABLET EVERY DAY FOR CHOLESTEROL 90 tablet 3 04/06/2015 at Unknown time  . insulin lispro (HUMALOG) 100 UNIT/ML injection Inject 5-10 Units into the skin 3 (three) times daily before meals. 5 units for cbg 250-350 8 units for cbg >350-450 10 units for cbg>450    unknown   Scheduled: . [START ON 04/08/2015] ALPRAZolam  0.125 mg Oral QHS  . [START ON 04/08/2015]  ceFAZolin (ANCEF) IV  2 g Intravenous On Call to OR  . [START ON 04/08/2015]  ceFAZolin (ANCEF) IV  2 g Intravenous Q6H  . [START ON 04/08/2015] chlorhexidine  60 mL Topical Once  . [START ON 04/08/2015] citalopram  20 mg Oral Daily  . [START ON 04/08/2015] diltiazem  120 mg Oral TID  . [START ON 04/08/2015] donepezil  23 mg Oral QHS  . [START ON 04/08/2015] dorzolamide-timolol  1 drop Both Eyes BID  . [START ON 04/08/2015] enoxaparin (LOVENOX) injection  40 mg Subcutaneous Q24H  . [START ON 04/08/2015] ferrous fumarate  1  tablet Oral BID  . [START ON 04/08/2015] ferrous sulfate  325 mg Oral TID PC  . [START ON 04/08/2015] furosemide  40 mg Oral Daily  . insulin aspart      . [START ON 04/08/2015] insulin aspart  0-15 Units Subcutaneous 6 times per day  . [START ON 04/08/2015] insulin detemir  10 Units Subcutaneous QHS  . [START ON 04/08/2015] isosorbide mononitrate  90 mg Oral Daily  . [START ON 04/08/2015] labetalol  200 mg Oral BID  . [START ON 04/08/2015] lactated ringers   Intravenous STAT  . [START ON 04/08/2015] levothyroxine  75 mcg Oral Daily  . [START ON 04/08/2015] linagliptin  5 mg Oral Daily  . [START ON 04/08/2015] lisinopril  10 mg Oral Daily  . [START ON 04/08/2015] loratadine  10 mg Oral Daily  . [START ON 04/08/2015] olopatadine  1 drop Both Eyes BID  . [START ON 04/08/2015] polyethylene glycol  17 g Oral Daily  . [START ON 04/08/2015] potassium chloride  10 mEq Oral BID  . [START ON 04/08/2015] pravastatin  40 mg Oral Daily  . [START ON 04/08/2015] vitamin B-12  1,000 mcg Oral Daily  . [START ON 04/08/2015] Vitamin D3  2 tablet Oral Daily    Results for orders placed or performed during the hospital encounter of 04/07/15 (from the past 48 hour(s))  CBG monitoring, ED     Status: Abnormal   Collection Time: 04/07/15  4:24 PM  Result Value Ref Range   Glucose-Capillary 299 (H) 70 - 99 mg/dL   CBC with Differential/Platelet     Status: Abnormal   Collection Time: 04/07/15  6:32 PM  Result Value Ref Range   WBC 11.6 (H) 4.0 - 10.5 K/uL   RBC 4.44 4.22 - 5.81 MIL/uL   Hemoglobin 12.7 (L) 13.0 - 17.0 g/dL   HCT 38.0 (L) 39.0 - 52.0 %   MCV 85.6 78.0 - 100.0 fL   MCH 28.6 26.0 - 34.0 pg   MCHC 33.4 30.0 - 36.0 g/dL   RDW 16.5 (H) 11.5 - 15.5 %   Platelets 113 (L) 150 - 400 K/uL    Comment: PLATELET COUNT CONFIRMED BY SMEAR REPEATED TO VERIFY SPECIMEN CHECKED FOR CLOTS    Neutrophils Relative % 82 (H) 43 - 77 %   Neutro Abs 9.6 (H) 1.7 - 7.7 K/uL   Lymphocytes Relative 7 (L) 12 - 46 %   Lymphs Abs 0.9 0.7 - 4.0 K/uL   Monocytes Relative 9 3 - 12 %   Monocytes Absolute 1.0 0.1 - 1.0 K/uL   Eosinophils Relative 1 0 - 5 %   Eosinophils Absolute 0.2 0.0 - 0.7 K/uL   Basophils Relative 0 0 - 1 %   Basophils Absolute 0.0 0.0 - 0.1 K/uL  Basic metabolic panel     Status: Abnormal   Collection Time: 04/07/15  6:32 PM  Result Value Ref Range   Sodium 138 135 - 145 mmol/L   Potassium 4.1 3.5 - 5.1 mmol/L   Chloride 106 96 - 112 mmol/L   CO2 23 19 - 32 mmol/L   Glucose, Bld 344 (H) 70 - 99 mg/dL   BUN 20 6 - 23 mg/dL   Creatinine, Ser 1.15 0.50 - 1.35 mg/dL   Calcium 8.9 8.4 - 10.5 mg/dL   GFR calc non Af Amer 58 (L) >90 mL/min   GFR calc Af Amer 67 (L) >90 mL/min    Comment: (NOTE) The eGFR has been calculated using the CKD EPI  equation. This calculation has not been validated in all clinical situations. eGFR's persistently <90 mL/min signify possible Chronic Kidney Disease.    Anion gap 9 5 - 15  Protime-INR     Status: Abnormal   Collection Time: 04/07/15  6:32 PM  Result Value Ref Range   Prothrombin Time 15.8 (H) 11.6 - 15.2 seconds   INR 1.25 0.00 - 1.49  APTT     Status: None   Collection Time: 04/07/15  6:32 PM  Result Value Ref Range   aPTT 33 24 - 37 seconds  Type and screen     Status: None   Collection Time: 04/07/15  6:32 PM  Result Value Ref Range     ABO/RH(D) O POS    Antibody Screen NEG    Sample Expiration 04/10/2015   ABO/Rh     Status: None   Collection Time: 04/07/15  6:32 PM  Result Value Ref Range   ABO/RH(D) O POS   I-stat chem 8, ed     Status: Abnormal   Collection Time: 04/07/15  6:53 PM  Result Value Ref Range   Sodium 138 135 - 145 mmol/L   Potassium 4.0 3.5 - 5.1 mmol/L   Chloride 104 96 - 112 mmol/L   BUN 21 6 - 23 mg/dL   Creatinine, Ser 1.10 0.50 - 1.35 mg/dL   Glucose, Bld 347 (H) 70 - 99 mg/dL   Calcium, Ion 1.17 1.13 - 1.30 mmol/L   TCO2 18 0 - 100 mmol/L   Hemoglobin 12.9 (L) 13.0 - 17.0 g/dL   HCT 38.0 (L) 39.0 - 52.0 %  Glucose, capillary     Status: Abnormal   Collection Time: 04/07/15 10:35 PM  Result Value Ref Range   Glucose-Capillary 275 (H) 70 - 99 mg/dL    Dg Chest 1 View  04/07/2015   CLINICAL DATA:  Unwitnessed fall, right hip fracture, dementia  EXAM: CHEST  1 VIEW  COMPARISON:  10/24/2014  FINDINGS: Lungs are essentially clear. No frank interstitial edema. No pleural effusion or pneumothorax.  Heart is top-normal in size. Left subclavian ICD. Abandoned right subclavian pacemaker leads. Postsurgical changes related to prior CABG.  Cholecystectomy clips.  IMPRESSION: No evidence of acute cardiopulmonary disease. No frank interstitial edema.   Electronically Signed   By: Julian Hy M.D.   On: 04/07/2015 17:31   Ct Head Wo Contrast  04/07/2015   CLINICAL DATA:  Per ems, pt with unwitnessed fall at 3am, trxfr to ED with known right hip fracture. Baseline is dementia, oriented only to self.  EXAM: CT HEAD WITHOUT CONTRAST  CT CERVICAL SPINE WITHOUT CONTRAST  TECHNIQUE: Multidetector CT imaging of the head and cervical spine was performed following the standard protocol without intravenous contrast. Multiplanar CT image reconstructions of the cervical spine were also generated.  COMPARISON:  03/22/2014  FINDINGS: CT HEAD FINDINGS  Old left temporal infarct with associated encephalomalacia and ex  vacuo dilation of the left lateral ventricle temporal horn and atrium, stable no parenchymal masses or mass effect. No evidence of a recent cortical infarct.  Ventricles are enlarged, to a greater degree than the sulci, but stable from the prior CT. No convincing hydrocephalus.  No extra-axial masses or abnormal fluid collections.  There is no intracranial hemorrhage.  Mild sinus mucosal thickening mostly along the ethmoid air cells. Clear mastoid air cells no skull fracture.  CT CERVICAL SPINE FINDINGS  No fracture. No spondylolisthesis. Moderate loss of disc height noted at C 6- C7 and C7-T1.  Mild loss disc height at C5-C6. Facet degenerative change noted bilaterally. There are multiple levels of mild diffuse spondylotic disc bulging and at least mild degrees of neural foraminal narrowing.  Bones are demineralized.  Soft tissues show carotid vascular calcifications, but no masses or enlarged lymph nodes. Lung apices are clear.  IMPRESSION: HEAD CT: No acute intracranial abnormalities. Stable appearance from the prior exam.  CERVICAL CT:  No fracture or acute finding.   Electronically Signed   By: Lajean Manes M.D.   On: 04/07/2015 17:10   Ct Cervical Spine Wo Contrast  04/07/2015   CLINICAL DATA:  Per ems, pt with unwitnessed fall at 3am, trxfr to ED with known right hip fracture. Baseline is dementia, oriented only to self.  EXAM: CT HEAD WITHOUT CONTRAST  CT CERVICAL SPINE WITHOUT CONTRAST  TECHNIQUE: Multidetector CT imaging of the head and cervical spine was performed following the standard protocol without intravenous contrast. Multiplanar CT image reconstructions of the cervical spine were also generated.  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 No evidence of a recent cortical infarct.  Ventricles are enlarged,  to a greater degree than the sulci, but stable from the prior CT. No convincing hydrocephalus.  No extra-axial masses or abnormal fluid collections.  There is no intracranial hemorrhage.  Mild sinus mucosal thickening mostly along the ethmoid air cells. Clear mastoid air cells no skull fracture.  CT CERVICAL SPINE FINDINGS  No fracture. No spondylolisthesis. Moderate loss of disc height noted at C 6- C7 and C7-T1. Mild loss disc height at C5-C6. Facet degenerative change noted bilaterally. There are multiple levels of mild diffuse spondylotic disc bulging and at least mild degrees of neural foraminal narrowing.  Bones are demineralized.  Soft tissues show carotid vascular calcifications, but no masses or enlarged lymph nodes. Lung apices are clear.  IMPRESSION: HEAD CT: No acute intracranial abnormalities. Stable appearance from the prior exam.  CERVICAL CT:  No fracture or acute finding.   Electronically Signed   By: Lajean Manes M.D.   On: 04/07/2015 17:10   Dg Hips Bilat With Pelvis 3-4 Views  04/07/2015   CLINICAL DATA:  Unwitnessed fall, now transferred to the emergency department with known right hip fracture. Initial encounter.  EXAM: BILATERAL HIP (WITH PELVIS) 3-4 VIEWS  COMPARISON:  None.  FINDINGS: There is a comminuted minimally displaced right-sided intertrochanteric femur fracture with minimal foreshortening and varus angulation. No definite intra-articular extension. A prominent (approximately 1.2 cm) presumed bone island is noted within the proximal femoral diaphysis.  No left-sided hip fracture. Mild-to-moderate degenerative change of the left hip with joint space loss, subchondral sclerosis and osteophytosis. No evidence of avascular necrosis. Note is made of a punctate (approximately 1 cm) presumed bone island within the left acetabulum as well as a smaller (approximately 5 mm) suspected bone island within the left femoral head.  No definite displaced pelvic fracture.  Vascular calcifications  are noted within the pelvis and upper aspects of the thigh bilaterally. No radiopaque foreign body.  IMPRESSION: 1. Acute minimally displaced right-sided intertrochanteric femur fracture without definite intra-articular extension. 2. No left-sided femur fracture.   Electronically Signed   By: Sandi Mariscal M.D.   On: 04/07/2015 17:29    Review of Systems  Unable to perform ROS: dementia   Blood pressure 134/91, pulse 73, temperature 97.8  F (36.6 C), resp. rate 17, SpO2 94 %. Physical Exam  Nursing note and vitals reviewed. Constitutional: He appears well-developed and well-nourished. No distress.  HENT:  Head: Normocephalic and atraumatic.  Nose: Nose normal.  Mouth/Throat: Oropharynx is clear and moist. No oropharyngeal exudate.  Eyes: Conjunctivae and EOM are normal. Pupils are equal, round, and reactive to light. No scleral icterus.  Neck: Normal range of motion. Neck supple. No JVD present. No tracheal deviation present.  Cardiovascular: Normal rate, regular rhythm, normal heart sounds and intact distal pulses.  Exam reveals no gallop.   No murmur heard. Respiratory: Effort normal and breath sounds normal. No respiratory distress. He has no wheezes. He has no rales.  GI: Soft. Bowel sounds are normal. He exhibits no distension. There is no tenderness. There is no rebound.  Musculoskeletal: He exhibits tenderness. He exhibits no edema.  S/p right ORIF hip; did not assess ROM in right leg, some mild edema around right hip/surgery site  Neurological: He is alert.  Oriented to person only  Skin: Skin is warm and dry. No rash noted. He is not diaphoretic. No erythema.  Psychiatric:  dementia  echo reviewed; labs reviewed; ECG V-paced rhythm Medtronic Protecta XT CRT-D [id number] device Battery 2.97V ( RRT=2.63) VVIR 60/120 bpm  VT/VF - VT monitor 158, VT zone 176 (Burst x2, 20J, 35J x4), VF 200 (18/24) (ATP during charging, 25J, 35J x5) AS-VS 20.6%; ASVP 79.4% She's in AF with patient  activity < 1hr/day for 23 weeks  Assessment/Plan:  [name] is an 81 yo man with PMH of CAD, ischemic cardiomyopathy with CRT-D last known EF 25-30% 03/2014, permanent atrial fibrillation not on anticoagulation 2/2 previous ICH, hypertension, vascular dementia who was admitted with and now s/p ORIF for right hip fracture s/p IM nail. Cardiology is consulted given LV dysfunction, device and limited follow-up. Device interrogation reviewed and appears to be functioning well.  Problem List S/p right hip fracture and ORIF Chronic Systolic heart failure Permanent Atrial fibrillation Hypertension CKD stage III T2DM Vascular Dementia  Plan:  1. Would continue diltiazem for rate control 120 mg tid 2. Continue lasix 40 mg daily for volume management 3. Continue lisinopril 10 mg daily for hypertension 4. Continue aspirin 81 mg daily 5. Daily weights, watch for volume accumulation 6. Unsure what BB he takes - would clarify  Mohanad Carsten 04/07/2015, 11:27 PM

## 2015-04-07 NOTE — ED Notes (Signed)
Per ems, pt with unwitnessed fall at 3am, trxfr to ED with known right hip fracture.  Baseline is dementia, oriented only to self.

## 2015-04-07 NOTE — Consult Note (Signed)
Reason for Consult:right hip fracture Referring Physician: [name] is an 81 y.o. male.  HPI: 81 yo household ambulator who presents to the [facility] ED complaining of right hip pain after a mechanical fall yesterday.  Patient unable to bear weight on the right LE.  Resides in local nursing home. Transported by EMS to Ed for further evaluation  Past Medical History  Diagnosis Date  . Hypertension   . Ischemic heart disease   . Hyperlipemia   . Depression   . Testicular cancer   . Polio     child polio  . Rheumatic fever   . Mild dementia   . Mild dementia   . Systolic heart failure     acute on chronic  . CAD (coronary artery disease)   . Chronic atrial fibrillation   . Vitamin D deficiency   . Type II or unspecified type diabetes mellitus without mention of complication, not stated as uncontrolled   . Other testicular hypofunction   . Permanent atrial fibrillation 01/21/2014  . ICD, biventricular, in situ - Medtronic Protecta 2013, leads 2009 01/21/2014    New Generator Medtronic [place] model number [id number], serial number [id number]  Right atrial lead (chronic) Medtronic, model number [id number], serial [id number] (implanted 05/01/2008)  Right ventricular lead (chronic) Medtronic, model number [id number], serial number [id number] (implanted 05/01/2008)  Left ventricular lead Medtronic, model number [id number], serial number [id number] (implanted 05/01/2008)  Exp  . Pleural effusion, right 03/18/2014  . Emphysema 03/18/2014  . Pulmonary infiltrate 05/10/2014    CT chest 03/2014:  RUL interstitial/GGO     Past Surgical History  Procedure Laterality Date  . Coronary artery bypass graft  06/26/2002    LIMA to diagonal artery & SVG to the first obtuse margin  . Nm myocar perf wall motion  09/15/2006    Dilated LV w/small area of possible nontransmural inferior scar w/mild perinfarct ischemia.  No significant change from previous study.  . Cardiac defibrillator placement  05/01/2008   Medtronic  . Biv icd genertaor change out N/A 10/27/2012    Procedure: BIV ICD GENERTAOR CHANGE OUT;  Surgeon: Sanda Klein, MD;  Location: [facility] CATH LAB;  Service: Cardiovascular;  Laterality: N/A;    Family History  Problem Relation Age of Onset  . Other Mother   . Heart attack Father     Social History:  reports that he quit smoking about 46 years ago. He has never used smokeless tobacco. He reports that he does not drink alcohol or use illicit drugs.  Allergies:  Allergies  Allergen Reactions  . Lipitor [Atorvastatin]     weakness    Medications: I have reviewed the patient's current medications.  Results for orders placed or performed during the hospital encounter of 04/07/15 (from the past 48 hour(s))  CBG monitoring, ED     Status: Abnormal   Collection Time: 04/07/15  4:24 PM  Result Value Ref Range   Glucose-Capillary 299 (H) 70 - 99 mg/dL    Dg Chest 1 View  04/07/2015   CLINICAL DATA:  Unwitnessed fall, right hip fracture, dementia  EXAM: CHEST  1 VIEW  COMPARISON:  10/24/2014  FINDINGS: Lungs are essentially clear. No frank interstitial edema. No pleural effusion or pneumothorax.  Heart is top-normal in size. Left subclavian ICD. Abandoned right subclavian pacemaker leads. Postsurgical changes related to prior CABG.  Cholecystectomy clips.  IMPRESSION: No evidence of acute cardiopulmonary disease. No frank interstitial edema.   Electronically Signed   By: Julian Hy  M.D.   On: 04/07/2015 17:31   Ct Head Wo Contrast  04/07/2015   CLINICAL DATA:  Per ems, pt with unwitnessed fall at 3am, trxfr to ED with known right hip fracture. Baseline is dementia, oriented only to self.  EXAM: CT HEAD WITHOUT CONTRAST  CT CERVICAL SPINE WITHOUT CONTRAST  TECHNIQUE: Multidetector CT imaging of the head and cervical spine was performed following the standard protocol without intravenous contrast. Multiplanar CT image reconstructions of the cervical spine were also generated.   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 No evidence of a recent cortical infarct.  Ventricles are enlarged, to a greater degree than the sulci, but stable from the prior CT. No convincing hydrocephalus.  No extra-axial masses or abnormal fluid collections.  There is no intracranial hemorrhage.  Mild sinus mucosal thickening mostly along the ethmoid air cells. Clear mastoid air cells no skull fracture.  CT CERVICAL SPINE FINDINGS  No fracture. No spondylolisthesis. Moderate loss of disc height noted at C 6- C7 and C7-T1. Mild loss disc height at C5-C6. Facet degenerative change noted bilaterally. There are multiple levels of mild diffuse spondylotic disc bulging and at least mild degrees of neural foraminal narrowing.  Bones are demineralized.  Soft tissues show carotid vascular calcifications, but no masses or enlarged lymph nodes. Lung apices are clear.  IMPRESSION: HEAD CT: No acute intracranial abnormalities. Stable appearance from the prior exam.  CERVICAL CT:  No fracture or acute finding.   Electronically Signed   By: Lajean Manes M.D.   On: 04/07/2015 17:10   Ct Cervical Spine Wo Contrast  04/07/2015   CLINICAL DATA:  Per ems, pt with unwitnessed fall at 3am, trxfr to ED with known right hip fracture. Baseline is dementia, oriented only to self.  EXAM: CT HEAD WITHOUT CONTRAST  CT CERVICAL SPINE WITHOUT CONTRAST  TECHNIQUE: Multidetector CT imaging of the head and cervical spine was performed following the standard protocol without intravenous contrast. Multiplanar CT image reconstructions of the cervical spine were also generated.  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 No evidence of a recent cortical infarct.  Ventricles are enlarged, to a greater degree than the sulci, but stable from the prior CT. No convincing hydrocephalus.  No extra-axial masses or abnormal fluid collections.  There is no intracranial hemorrhage.  Mild sinus mucosal thickening mostly along the ethmoid air cells. Clear mastoid air cells no skull fracture.  CT CERVICAL SPINE FINDINGS  No fracture. No spondylolisthesis. Moderate loss of disc height noted at C 6- C7 and C7-T1. Mild loss disc height at C5-C6. Facet degenerative change noted bilaterally. There are multiple levels of mild diffuse spondylotic disc bulging and at least mild degrees of neural foraminal narrowing.  Bones are demineralized.  Soft tissues show carotid vascular calcifications, but no masses or enlarged lymph nodes. Lung apices are clear.  IMPRESSION: HEAD CT: No acute intracranial abnormalities. Stable appearance from the prior exam.  CERVICAL CT:  No fracture or acute finding.   Electronically Signed   By: Lajean Manes M.D.   On: 04/07/2015 17:10   Dg Hips Bilat With Pelvis 3-4 Views  04/07/2015   CLINICAL DATA:  Unwitnessed fall, now transferred to the emergency department with known right hip fracture. Initial encounter.  EXAM: BILATERAL HIP (WITH PELVIS) 3-4 VIEWS  COMPARISON:  None.  FINDINGS: There is a comminuted minimally displaced right-sided intertrochanteric femur fracture with minimal foreshortening and varus angulation. No definite intra-articular extension. A prominent (approximately 1.2 cm) presumed bone island is noted within the proximal femoral diaphysis.  No left-sided hip fracture. Mild-to-moderate degenerative change of the left hip with joint space loss, subchondral sclerosis and osteophytosis. No evidence of avascular necrosis. Note is made of a punctate (approximately 1 cm) presumed bone island within the left acetabulum as well as a smaller (approximately 5  mm) suspected bone island within the left femoral head.  No definite displaced pelvic fracture.  Vascular calcifications are noted within the pelvis and upper aspects of the thigh bilaterally. No radiopaque foreign body.  IMPRESSION: 1. Acute minimally displaced right-sided intertrochanteric femur fracture without definite intra-articular extension. 2. No left-sided femur fracture.   Electronically Signed   By: Sandi Mariscal M.D.   On: 04/07/2015 17:29    ROS Blood pressure 149/71, pulse 80, resp. rate 27, SpO2 94 %. Physical Exam AAO neck nontender, back nontender, bilateral clavicles and shoulders nontender with normal ROM, NVI Right LE with shortened and externally rotated right LE, NVI  L LE with no pain with AROM of the hip, knee, and wrist  Assessment/Plan: Right hip displaced intertrochanteric hip fracture.  I discussed with the patient and his wife and daughter that we will need to stabilize the hip surgically to allow for mobilization and basic care.  They understand and agree to the proposed plan  Troy Warren,STEVEN R 04/07/2015, 6:44 PM

## 2015-04-08 ENCOUNTER — Encounter (HOSPITAL_COMMUNITY): Payer: Self-pay | Admitting: Orthopedic Surgery

## 2015-04-08 DIAGNOSIS — I5042 Chronic combined systolic (congestive) and diastolic (congestive) heart failure: Secondary | ICD-10-CM

## 2015-04-08 LAB — GLUCOSE, CAPILLARY
GLUCOSE-CAPILLARY: 284 mg/dL — AB (ref 70–99)
GLUCOSE-CAPILLARY: 140 mg/dL — AB (ref 70–99)
Glucose-Capillary: 157 mg/dL — ABNORMAL HIGH (ref 70–99)
Glucose-Capillary: 176 mg/dL — ABNORMAL HIGH (ref 70–99)
Glucose-Capillary: 161 mg/dL — ABNORMAL HIGH (ref 70–99)
Glucose-Capillary: 264 mg/dL — ABNORMAL HIGH (ref 70–99)

## 2015-04-08 LAB — CBC
HCT: 35.6 % — ABNORMAL LOW (ref 39.0–52.0)
HEMATOCRIT: 37 % — AB (ref 39.0–52.0)
HEMOGLOBIN: 11.8 g/dL — AB (ref 13.0–17.0)
HEMOGLOBIN: 11.4 g/dL — AB (ref 13.0–17.0)
MCH: 27.7 pg (ref 26.0–34.0)
MCH: 28.1 pg (ref 26.0–34.0)
MCHC: 31.9 g/dL (ref 30.0–36.0)
MCHC: 32 g/dL (ref 30.0–36.0)
MCV: 86.9 fL (ref 78.0–100.0)
MCV: 87.9 fL (ref 78.0–100.0)
Platelets: 114 10*3/uL — ABNORMAL LOW (ref 150–400)
Platelets: 120 10*3/uL — ABNORMAL LOW (ref 150–400)
RBC: 4.26 MIL/uL (ref 4.22–5.81)
RBC: 4.05 MIL/uL — ABNORMAL LOW (ref 4.22–5.81)
RDW: 16.7 % — ABNORMAL HIGH (ref 11.5–15.5)
RDW: 16.8 % — ABNORMAL HIGH (ref 11.5–15.5)
WBC: 13.4 10*3/uL — ABNORMAL HIGH (ref 4.0–10.5)
WBC: 13.1 10*3/uL — ABNORMAL HIGH (ref 4.0–10.5)

## 2015-04-08 LAB — IRON AND TIBC
Iron: 32 ug/dL — ABNORMAL LOW (ref 42–165)
Saturation Ratios: 14 % — ABNORMAL LOW (ref 20–55)
TIBC: 224 ug/dL (ref 215–435)
UIBC: 192 ug/dL (ref 125–400)

## 2015-04-08 LAB — BASIC METABOLIC PANEL
Anion gap: 7 (ref 5–15)
BUN: 18 mg/dL (ref 6–23)
CO2: 27 mmol/L (ref 19–32)
Calcium: 8.7 mg/dL (ref 8.4–10.5)
Chloride: 108 mmol/L (ref 96–112)
Creatinine, Ser: 1.11 mg/dL (ref 0.50–1.35)
GFR, EST AFRICAN AMERICAN: 70 mL/min — AB (ref >90)
GFR, EST NON AFRICAN AMERICAN: 60 mL/min — AB (ref >90)
GLUCOSE: 134 mg/dL — AB (ref 70–99)
Potassium: 3.9 mmol/L (ref 3.5–5.1)
Sodium: 142 mmol/L (ref 135–145)

## 2015-04-08 LAB — URINALYSIS, ROUTINE W REFLEX MICROSCOPIC
Bilirubin Urine: NEGATIVE
Glucose, UA: 100 mg/dL — AB
Ketones, ur: NEGATIVE mg/dL
Leukocytes, UA: NEGATIVE
Nitrite: NEGATIVE
PROTEIN: 30 mg/dL — AB
SPECIFIC GRAVITY, URINE: 1.025 (ref 1.005–1.030)
Urobilinogen, UA: 0.2 mg/dL (ref 0.0–1.0)
pH: 5 (ref 5.0–8.0)

## 2015-04-08 LAB — URINE MICROSCOPIC-ADD ON

## 2015-04-08 LAB — CREATININE, SERUM
Creatinine, Ser: 1.22 mg/dL (ref 0.50–1.35)
GFR calc Af Amer: 62 mL/min — ABNORMAL LOW (ref >90)
GFR, EST NON AFRICAN AMERICAN: 54 mL/min — AB (ref >90)

## 2015-04-08 LAB — SURGICAL PCR SCREEN
MRSA, PCR: NEGATIVE
Staphylococcus aureus: NEGATIVE

## 2015-04-08 LAB — TSH: TSH: 0.985 u[IU]/mL (ref 0.350–4.500)

## 2015-04-08 LAB — FERRITIN: Ferritin: 215 ng/mL (ref 22–322)

## 2015-04-08 MED ORDER — GLUCERNA SHAKE PO LIQD
237.0000 mL | Freq: Two times a day (BID) | ORAL | Status: DC
  Administered 2015-04-08 – 2015-04-10 (×4): 237 mL via ORAL

## 2015-04-08 MED ORDER — INSULIN ASPART 100 UNIT/ML ~~LOC~~ SOLN
0.0000 [IU] | Freq: Three times a day (TID) | SUBCUTANEOUS | Status: DC
  Administered 2015-04-08 (×2): 3 [IU] via SUBCUTANEOUS
  Administered 2015-04-09: 5 [IU] via SUBCUTANEOUS
  Administered 2015-04-09: 8 [IU] via SUBCUTANEOUS
  Administered 2015-04-09 – 2015-04-10 (×2): 2 [IU] via SUBCUTANEOUS
  Administered 2015-04-10: 3 [IU] via SUBCUTANEOUS

## 2015-04-08 NOTE — Progress Notes (Signed)
Inpatient Diabetes Program Recommendations  AACE/ADA: New Consensus Statement on Inpatient Glycemic Control (2013)  Target Ranges:  Prepandial:   less than 140 mg/dL      Peak postprandial:   less than 180 mg/dL (1-2 hours)      Critically ill patients:  140 - 180 mg/dL   Reason for assessment: elevated CBG   Diabetes history: Type 2 Outpatient Diabetes medications: Levemir 10 units qhs, Tradjenta 5mg /day, Humalog 5-10units tid based on blood sugars Current orders for Inpatient glycemic control: Levemir 10 units qhs, Tradjenta 5mg /day, Novolog 5-10units 6x/day  Patient is now eating- please consider changing Novolog from q4h to tid and hs.  Gentry Fitz, RN, BA, MHA, CDE Diabetes Coordinator Inpatient Diabetes Program  [phone] (Team Pager) [phone] [facility]) 04/08/2015 9:47 AM

## 2015-04-08 NOTE — Progress Notes (Signed)
INITIAL NUTRITION ASSESSMENT  DOCUMENTATION CODES Per approved criteria  -Not Applicable   INTERVENTION: Provide Glucerna Shake po BID, each supplement provides 220 kcal and 10 grams of protein.  Encourage adequate PO intake.  NUTRITION DIAGNOSIS: Increased nutrient needs related to s/p surgery as evidenced by estimated nutrition needs.   Goal: Pt to meet >/= 90% of their estimated nutrition needs   Monitor:  PO intake, weight trends, labs, I/O's  Reason for Assessment: MD consult  81 y.o. male  Admitting Dx: Closed intratrochanteric fracture of right femur  ASSESSMENT: Pt presents with broken hip. X ray revealed a fracture of the right hip. PMH of Systolic CHF with an EF of 25-30% and has DM II with renal manifestations, HTN and CKD III. Patient also has a history of dementia and is a resident of a SNF.  PROCEDURE (4/25): INTRAMEDULLARY (IM) NAIL INTERTROCHANTRIC (Right), Biomet [employer] short nail  Pt reports having a good appetite currently and PTA with 3 meals a day and no other difficulties. Weight has been stable. Pt is agreeable to Glucerna Shake to aid in healing. RD to order. Pt was encouraged to eat his food at meals.   Nutrition Focused Physical Exam:  Subcutaneous Fat:  Orbital Region: N/A Upper Arm Region: Moderate depletion Thoracic and Lumbar Region: WNL  Muscle:  Temple Region: N/A Clavicle Bone Region: WNL Clavicle and Acromion Bone Region: WNL Scapular Bone Region: N/A Dorsal Hand: N/A Patellar Region: WNL Anterior Thigh Region: Moderate depletion Posterior Calf Region: N/A  Edema: none  Fat and muscle mass loss likely associated with the natural aging process.  Labs and medications reviewed.  Height: Ht Readings from Last 1 Encounters:  04/07/15 5\' 7"  (1.702 m)    Weight: Wt Readings from Last 1 Encounters:  04/07/15 169 lb 12.8 oz (77.021 kg)    Ideal Body Weight: 148 lbs  % Ideal Body Weight: 114%  Wt Readings from Last 10  Encounters:  04/07/15 169 lb 12.8 oz (77.021 kg)  04/07/15 161 lb 6.4 oz (73.211 kg)  03/06/15 160 lb (72.576 kg)  02/06/15 160 lb (72.576 kg)  01/08/15 167 lb (75.751 kg)  12/11/14 162 lb (73.483 kg)  11/04/14 160 lb 3.2 oz (72.666 kg)  10/16/14 168 lb (76.204 kg)  10/07/14 166 lb (75.297 kg)  08/27/14 169 lb (76.658 kg)    Usual Body Weight: 169 lbs  % Usual Body Weight: 100%  BMI:  Body mass index is 26.59 kg/(m^2).  Estimated Nutritional Needs: Kcal: 1800-2000 Protein: 85-95 grams Fluid: 1.8 - 2 L/day  Skin: Incision on R hip, wound on R elbow  Diet Order: Diet heart healthy/carb modified Room service appropriate?: Yes; Fluid consistency:: Thin  EDUCATION NEEDS: -No education needs identified at this time   Intake/Output Summary (Last 24 hours) at 04/08/15 1113 Last data filed at 04/08/15 0458  Gross per 24 hour  Intake    940 ml  Output    675 ml  Net    265 ml    Last BM: PTA  Labs:   Recent Labs Lab 04/07/15 1832 04/07/15 1853 04/08/15 0100 04/08/15 0720  NA 138 138  --  142  K 4.1 4.0  --  3.9  CL 106 104  --  108  CO2 23  --   --  27  BUN 20 21  --  18  CREATININE 1.15 1.10 1.22 1.11  CALCIUM 8.9  --   --  8.7  GLUCOSE 344* 347*  --  134*  CBG (last 3)   Recent Labs  04/07/15 2342 04/08/15 0339 04/08/15 0752  GLUCAP 257* 157* 140*    Scheduled Meds: . ALPRAZolam  0.125 mg Oral QHS  .  ceFAZolin (ANCEF) IV  2 g Intravenous On Call to OR  . chlorhexidine  60 mL Topical Once  . cholecalciferol  2,000 Units Oral Daily  . citalopram  20 mg Oral Daily  . diltiazem  120 mg Oral TID  . donepezil  23 mg Oral QHS  . dorzolamide-timolol  1 drop Both Eyes BID  . enoxaparin (LOVENOX) injection  40 mg Subcutaneous Q24H  . ferrous fumarate  1 tablet Oral BID  . ferrous sulfate  325 mg Oral TID PC  . furosemide  40 mg Oral Daily  . insulin aspart  0-15 Units Subcutaneous 6 times per day  . insulin detemir  10 Units Subcutaneous QHS  .  isosorbide mononitrate  90 mg Oral Daily  . labetalol  200 mg Oral BID  . lactated ringers   Intravenous STAT  . levothyroxine  75 mcg Oral QAC breakfast  . linagliptin  5 mg Oral Daily  . lisinopril  10 mg Oral Daily  . loratadine  10 mg Oral Daily  . olopatadine  1 drop Both Eyes BID  . polyethylene glycol  17 g Oral Daily  . potassium chloride  10 mEq Oral BID  . pravastatin  40 mg Oral Daily  . vitamin B-12  1,000 mcg Oral Daily    Continuous Infusions: . sodium chloride    . sodium chloride 50 mL/hr at 04/07/15 2334  . sodium chloride      Past Medical History  Diagnosis Date  . Hypertension   . Ischemic heart disease   . Hyperlipemia   . Depression   . Testicular cancer   . Polio     child polio  . Rheumatic fever   . Mild dementia   . Mild dementia   . Systolic heart failure     acute on chronic  . CAD (coronary artery disease)   . Chronic atrial fibrillation   . Vitamin D deficiency   . Type II or unspecified type diabetes mellitus without mention of complication, not stated as uncontrolled   . Other testicular hypofunction   . Permanent atrial fibrillation 01/21/2014  . ICD, biventricular, in situ - Medtronic Protecta 2013, leads 2009 01/21/2014    New Generator Medtronic [place] model number [id number], serial number [id number]  Right atrial lead (chronic) Medtronic, model number [id number], serial [id number] (implanted 05/01/2008)  Right ventricular lead (chronic) Medtronic, model number [id number], serial number [id number] (implanted 05/01/2008)  Left ventricular lead Medtronic, model number [id number], serial number [id number] (implanted 05/01/2008)  Exp  . Pleural effusion, right 03/18/2014  . Emphysema 03/18/2014  . Pulmonary infiltrate 05/10/2014    CT chest 03/2014:  RUL interstitial/GGO     Past Surgical History  Procedure Laterality Date  . Coronary artery bypass graft  06/26/2002    LIMA to diagonal artery & SVG to the first obtuse margin  . Nm myocar perf wall motion   09/15/2006    Dilated LV w/small area of possible nontransmural inferior scar w/mild perinfarct ischemia.  No significant change from previous study.  . Cardiac defibrillator placement  05/01/2008    Medtronic  . Biv icd genertaor change out N/A 10/27/2012    Procedure: BIV ICD GENERTAOR CHANGE OUT;  Surgeon: Sanda Klein, MD;  Location: [facility] CATH LAB;  Service: Cardiovascular;  Laterality: N/A;  Tareek Sabo La, MS, RD, LDN Pager # [phone] After hours/ weekend pager # [phone]   

## 2015-04-08 NOTE — Progress Notes (Signed)
OT Cancellation Note  Patient Details Name: Troy Warren MRN: [id number] DOB: 05-Nov-1931   Cancelled Treatment:    Reason Eval/Treat Not Completed: Other (comment)  D/C plan is to return to SNF. No apparent immediate acute care OT needs, therefore will defer OT to SNF. If OT eval is needed please call Acute Rehab Dept. at [phone] or text page OT at [phone].   [place], OTR/L  [phone] 04/08/2015 04/08/2015, 7:48 AM

## 2015-04-08 NOTE — Progress Notes (Signed)
Orthopedic Tech Progress Note Patient Details:  Troy Warren Jun 15, 1931 [id number]  Patient ID: Troy Warren, male   DOB: [birth year], 81 y.o.   MRN: [id number] Pt unable to use trapeze bar patient helper  Hildred Priest 04/08/2015, 5:56 AM

## 2015-04-08 NOTE — Progress Notes (Signed)
2nd RN skin assessment 

## 2015-04-08 NOTE — Clinical Social Work Note (Signed)
Patient's payer source [facility] which requires a prior authorization for SNF placement even though patient is from SNF.  CSW awaiting bedrest orders to be lifted and PT to evaluate in order to submit for insurance authorization.  MD notified.  PT and OT evaluations are needed for [facility].  Nonnie Done, LCSW [phone]  Psychiatric & Orthopedics (5N 1-8) Clinical Social Worker

## 2015-04-08 NOTE — Progress Notes (Signed)
Utilization review completed.  

## 2015-04-08 NOTE — Progress Notes (Signed)
Orthopedics Progress Note  Subjective: Patient says hip feels better  Objective:  Filed Vitals:   04/08/15 2000  BP:   Pulse:   Temp:   Resp: 17    General: Awake and alert  Musculoskeletal: right LE with minimal pain with log roll.  Unable to DF ankle(? Understanding) was able to wiggle toes and PF foot. Well perfused skin in the foot. Hip dressings CDI Neurovascularly intact  Lab Results  Component Value Date   WBC 13.1* 04/08/2015   HGB 11.4* 04/08/2015   HCT 35.6* 04/08/2015   MCV 87.9 04/08/2015   PLT 120* 04/08/2015       Component Value Date/Time   NA 142 04/08/2015 0720   NA 140 03/10/2015   K 3.9 04/08/2015 0720   CL 108 04/08/2015 0720   CO2 27 04/08/2015 0720   GLUCOSE 134* 04/08/2015 0720   BUN 18 04/08/2015 0720   BUN 16 03/10/2015   CREATININE 1.11 04/08/2015 0720   CREATININE 1.0 03/10/2015   CREATININE 1.10 08/27/2014 1018   CALCIUM 8.7 04/08/2015 0720   GFRNONAA 60* 04/08/2015 0720   GFRNONAA 63 08/27/2014 1018   GFRAA 70* 04/08/2015 0720   GFRAA 73 08/27/2014 1018    Lab Results  Component Value Date   INR 1.25 04/07/2015   INR 1.13 03/24/2014   INR 1.13 03/18/2014    Assessment/Plan: POD #1 s/p Procedure(s): INTRAMEDULLARY (IM) NAIL INTERTROCHANTRIC Patient looks great this evening.  Unfortunately he was on bedrest today and not able to get up with therapy. I have changed his activity to Ambulate with assistance WBAT on the right and OOB q shift.  Appreciate excellent care from IM and Cards and the assistance of case management and social work. DVT prophylaxis and Decub precautions Will need SNF rehab - awaiting authorization  Doran Heater. Veverly Fells, MD 04/08/2015 10:41 PM

## 2015-04-08 NOTE — Progress Notes (Signed)
[facility] PROGRESS NOTE  Troy Warren NUU:[id number] DOB: 1931-05-19 DOA: 04/07/2015 PCP: Alesia Richards, MD  Assessment/Plan: #1 displaced right intertrochanteric hip fracture  Secondary to mechanical fall. Patient is status post right hip IM nail per Dr. Veverly Fells [id number]/2595 with no complications. Continue pain management. PT/OT. Will likely need skilled nursing facility.  #2 chronic kidney disease stage III Stable. Follow.  #3 hypertension Stable. Continue home regimen of Cardizem, Lasix, labetalol, lisinopril, Imdur.  #4 diabetes mellitus Continue trajenta, sliding scale insulin.  #5 hypothyroidism TSH within normal limits as 0.985. Continue Synthroid.  #6 chronic combined systolic/diastolic CHF/ICM EF of 63-87% Stable. Patient currently compensated. Continue home regimen of Lasix, labetalol, lisinopril, Imdur, Cardizem.  #7 atrial fibrillation CHADS2VASC= 5. Currently rate controlled on labetalol and Cardizem. Patient with history of dementia and falls and a such that a good anticoagulation candidate.  #8 hyperlipidemia Continue statin.  #9 dementia Continue Aricept.  #10 anemia Follow H&H. Continue iron supplementation.  #11 prophylaxis Lovenox for DVT prophylaxis.   Code Status: Full Family Communication: Updated patient, wife and daughter at bedside. Disposition Plan: Remain inpatient. Skilled nursing facility when medically stable and hopefully in 1-2 days.   Consultants:  Orthopedics: Dr. Netta Cedars 04/07/2015  Cardiology: Dr. Jules Husbands 04/07/2015  Procedures:  Chest x-ray 04/07/2015  CT head 04/07/2015  CT C-spine 04/07/2015  Plain films of bilateral hips 04/07/2015  Right hip IM nail. Dr. Veverly Fells 04/07/2015  Antibiotics:  None  HPI/Subjective: Patient states pain is controlled. Patient denies any shortness of breath. No chest pain.  Objective: Filed Vitals:   04/08/15 0626  BP: 126/61  Pulse: 85  Temp: 97.4 F  (36.3 C)  Resp:     Intake/Output Summary (Last 24 hours) at 04/08/15 1140 Last data filed at 04/08/15 0458  Gross per 24 hour  Intake    940 ml  Output    675 ml  Net    265 ml   Filed Weights   04/07/15 2343  Weight: 77.021 kg (169 lb 12.8 oz)    Exam:   General:  NAD  Cardiovascular: RRR  Respiratory: CTAB  Abdomen: Soft, nontender, nondistended, positive bowel sounds.  Musculoskeletal: No clubbing cyanosis or edema.  Data Reviewed: Basic Metabolic Panel:  Recent Labs Lab 04/07/15 1832 04/07/15 1853 04/08/15 0100 04/08/15 0720  NA 138 138  --  142  K 4.1 4.0  --  3.9  CL 106 104  --  108  CO2 23  --   --  27  GLUCOSE 344* 347*  --  134*  BUN 20 21  --  18  CREATININE 1.15 1.10 1.22 1.11  CALCIUM 8.9  --   --  8.7   Liver Function Tests: No results for input(s): AST, ALT, ALKPHOS, BILITOT, PROT, ALBUMIN in the last 168 hours. No results for input(s): LIPASE, AMYLASE in the last 168 hours. No results for input(s): AMMONIA in the last 168 hours. CBC:  Recent Labs Lab 04/07/15 1832 04/07/15 1853 04/08/15 0100 04/08/15 0720  WBC 11.6*  --  13.4* 13.1*  NEUTROABS 9.6*  --   --   --   HGB 12.7* 12.9* 11.8* 11.4*  HCT 38.0* 38.0* 37.0* 35.6*  MCV 85.6  --  86.9 87.9  PLT 113*  --  114* 120*   Cardiac Enzymes: No results for input(s): CKTOTAL, CKMB, CKMBINDEX, TROPONINI in the last 168 hours. BNP (last 3 results) No results for input(s): BNP in the last 8760 hours.  ProBNP (last 3 results)  Recent Labs  10/24/14 0845  PROBNP 2991.0*    CBG:  Recent Labs Lab 04/07/15 1959 04/07/15 2235 04/07/15 2342 04/08/15 0339 04/08/15 0752  GLUCAP 284* 275* 257* 157* 140*    Recent Results (from the past 240 hour(s))  Surgical pcr screen     Status: None   Collection Time: 04/07/15 11:39 PM  Result Value Ref Range Status   MRSA, PCR NEGATIVE NEGATIVE Final   Staphylococcus aureus NEGATIVE NEGATIVE Final    Comment:        The Xpert SA  Assay (FDA approved for NASAL specimens in patients over 33 years of age), is one component of a comprehensive surveillance program.  Test performance has been validated by [facility] for patients greater than or equal to 71 year old. It is not intended to diagnose infection nor to guide or monitor treatment.      Studies: Dg Chest 1 View  04/07/2015   CLINICAL DATA:  Unwitnessed fall, right hip fracture, dementia  EXAM: CHEST  1 VIEW  COMPARISON:  10/24/2014  FINDINGS: Lungs are essentially clear. No frank interstitial edema. No pleural effusion or pneumothorax.  Heart is top-normal in size. Left subclavian ICD. Abandoned right subclavian pacemaker leads. Postsurgical changes related to prior CABG.  Cholecystectomy clips.  IMPRESSION: No evidence of acute cardiopulmonary disease. No frank interstitial edema.   Electronically Signed   By: Julian Hy M.D.   On: 04/07/2015 17:31   Ct Head Wo Contrast  04/07/2015   CLINICAL DATA:  Per ems, pt with unwitnessed fall at 3am, trxfr to ED with known right hip fracture. Baseline is dementia, oriented only to self.  EXAM: CT HEAD WITHOUT CONTRAST  CT CERVICAL SPINE WITHOUT CONTRAST  TECHNIQUE: Multidetector CT imaging of the head and cervical spine was performed following the standard protocol without intravenous contrast. Multiplanar CT image reconstructions of the cervical spine were also generated.  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 No evidence of a recent cortical infarct.  Ventricles are enlarged, to a greater degree than the sulci, but stable from the prior CT. No convincing hydrocephalus.  No extra-axial masses or abnormal fluid collections.  There is no intracranial hemorrhage.  Mild sinus mucosal thickening mostly along the ethmoid air cells.  Clear mastoid air cells no skull fracture.  CT CERVICAL SPINE FINDINGS  No fracture. No spondylolisthesis. Moderate loss of disc height noted at C 6- C7 and C7-T1. Mild loss disc height at C5-C6. Facet degenerative change noted bilaterally. There are multiple levels of mild diffuse spondylotic disc bulging and at least mild degrees of neural foraminal narrowing.  Bones are demineralized.  Soft tissues show carotid vascular calcifications, but no masses or enlarged lymph nodes. Lung apices are clear.  IMPRESSION: HEAD CT: No acute intracranial abnormalities. Stable appearance from the prior exam.  CERVICAL CT:  No fracture or acute finding.   Electronically Signed   By: Lajean Manes M.D.   On: 04/07/2015 17:10   Ct Cervical Spine Wo Contrast  04/07/2015   CLINICAL DATA:  Per ems, pt with unwitnessed fall at 3am, trxfr to ED with known right hip fracture. Baseline is dementia, oriented only to self.  EXAM: CT HEAD WITHOUT CONTRAST  CT CERVICAL SPINE WITHOUT CONTRAST  TECHNIQUE: Multidetector CT imaging of the head and cervical spine was performed following the  standard protocol without intravenous contrast. Multiplanar CT image reconstructions of the cervical spine were also generated.  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 No evidence of a recent cortical infarct.  Ventricles are enlarged, to a greater degree than the sulci, but stable from the prior CT. No convincing hydrocephalus.  No extra-axial masses or abnormal fluid collections.  There is no intracranial hemorrhage.  Mild sinus mucosal thickening mostly along the ethmoid air cells. Clear mastoid air cells no skull fracture.  CT CERVICAL SPINE FINDINGS  No fracture. No spondylolisthesis. Moderate loss of disc height noted at C 6- C7 and C7-T1. Mild loss disc  height at C5-C6. Facet degenerative change noted bilaterally. There are multiple levels of mild diffuse spondylotic disc bulging and at least mild degrees of neural foraminal narrowing.  Bones are demineralized.  Soft tissues show carotid vascular calcifications, but no masses or enlarged lymph nodes. Lung apices are clear.  IMPRESSION: HEAD CT: No acute intracranial abnormalities. Stable appearance from the prior exam.  CERVICAL CT:  No fracture or acute finding.   Electronically Signed   By: Lajean Manes M.D.   On: 04/07/2015 17:10   Dg Hip Operative Unilat With Pelvis Right  04/08/2015   CLINICAL DATA:  81 year old male status post ORIF of right intertrochanteric fracture  EXAM: OPERATIVE RIGHT HIP (WITH PELVIS IF PERFORMED) 4 VIEWS  TECHNIQUE: Fluoroscopic spot image(s) were submitted for interpretation post-operatively.  FLUOROSCOPY TIME:  30 seconds reported  COMPARISON:  Preoperative radiographs 04/07/2015  FINDINGS: Interval ORIF of right intertrochanteric femoral fracture with intra medullary nail and cannulated transfemoral neck lag screw. Improved alignment of the fracture fragments compared to the preoperative images. No evidence of immediate hardware complication. Single distal interlocking screw. Scattered atherosclerotic vascular calcifications.  IMPRESSION: ORIF right intertrochanteric femoral fracture without evidence of hardware complication.   Electronically Signed   By: Jacqulynn Cadet M.D.   On: 04/08/2015 01:17   Dg Hips Bilat With Pelvis 3-4 Views  04/07/2015   CLINICAL DATA:  Unwitnessed fall, now transferred to the emergency department with known right hip fracture. Initial encounter.  EXAM: BILATERAL HIP (WITH PELVIS) 3-4 VIEWS  COMPARISON:  None.  FINDINGS: There is a comminuted minimally displaced right-sided intertrochanteric femur fracture with minimal foreshortening and varus angulation. No definite intra-articular extension. A prominent (approximately 1.2 cm) presumed bone  island is noted within the proximal femoral diaphysis.  No left-sided hip fracture. Mild-to-moderate degenerative change of the left hip with joint space loss, subchondral sclerosis and osteophytosis. No evidence of avascular necrosis. Note is made of a punctate (approximately 1 cm) presumed bone island within the left acetabulum as well as a smaller (approximately 5 mm) suspected bone island within the left femoral head.  No definite displaced pelvic fracture.  Vascular calcifications are noted within the pelvis and upper aspects of the thigh bilaterally. No radiopaque foreign body.  IMPRESSION: 1. Acute minimally displaced right-sided intertrochanteric femur fracture without definite intra-articular extension. 2. No left-sided femur fracture.   Electronically Signed   By: Sandi Mariscal M.D.   On: 04/07/2015 17:29    Scheduled Meds: . ALPRAZolam  0.125 mg Oral QHS  .  ceFAZolin (ANCEF) IV  2 g Intravenous On Call to OR  . chlorhexidine  60 mL Topical Once  . cholecalciferol  2,000 Units Oral Daily  . citalopram  20 mg Oral Daily  . diltiazem  120 mg Oral TID  . donepezil  23 mg Oral QHS  . dorzolamide-timolol  1 drop Both Eyes BID  . enoxaparin (LOVENOX) injection  40 mg Subcutaneous Q24H  . ferrous fumarate  1 tablet Oral BID  . ferrous sulfate  325 mg Oral TID PC  . furosemide  40 mg Oral Daily  . insulin aspart  0-15 Units Subcutaneous TID WC  . insulin detemir  10 Units Subcutaneous QHS  . isosorbide mononitrate  90 mg Oral Daily  . labetalol  200 mg Oral BID  . lactated ringers   Intravenous STAT  . levothyroxine  75 mcg Oral QAC breakfast  . linagliptin  5 mg Oral Daily  . lisinopril  10 mg Oral Daily  . loratadine  10 mg Oral Daily  . olopatadine  1 drop Both Eyes BID  . polyethylene glycol  17 g Oral Daily  . potassium chloride  10 mEq Oral BID  . pravastatin  40 mg Oral Daily  . vitamin B-12  1,000 mcg Oral Daily   Continuous Infusions: . sodium chloride    . sodium chloride  50 mL/hr at 04/07/15 2334  . sodium chloride      Principal Problem:   Closed intratrochanteric fracture of right femur Active Problems:   Hyperlipemia   Permanent atrial fibrillation   ICD, biventricular, in situ - [employer] Protecta 2013, leads 2009   Hypothyroidism   Chronic kidney disease, stage III (moderate)   Hypertension associated with stage 3 chronic kidney disease due to type 2 diabetes mellitus   DM (diabetes mellitus) type II controlled with renal manifestation   Mixed Alzheimer's and vascular dementia   Chronic combined systolic and diastolic CHF (congestive heart failure)    Time spent: 35 minutes    [facility] MD [facility] Pager [phone]. If 7PM-7AM, please contact night-coverage at [email or web address], password [facility] 04/08/2015, 11:40 AM  LOS: 1 day

## 2015-04-08 NOTE — Progress Notes (Signed)
PT Cancellation Note  Patient Details Name: VYRON FRONCZAK MRN: [id number] DOB: 11/22/1931   Cancelled Treatment:    Reason Eval/Treat Not Completed: Medical issues which prohibited therapy  Pt on bedrest orders. Will follow up to perform PT evaluation once activity orders have been increased.  Candy Sledge A 04/08/2015, 1:02 PM  Candy Sledge, [place], DPT [phone]

## 2015-04-08 NOTE — Clinical Social Work Note (Signed)
Patient is from [facility].  Per MD, discussion with daughter, patient will be returning to [facility] once medically discharged.  CSW following and assisting with disposition.  Full assessment to follow.  Nonnie Done, LCSW [phone]  Psychiatric & Orthopedics (5N 1-8) Clinical Social Worker

## 2015-04-08 NOTE — Op Note (Signed)
NAME:  Troy Warren, Troy Warren NO.:  [id number]  MEDICAL RECORD NO.:  [id number]  LOCATION:  5N02C                        FACILITY:  [facility]  PHYSICIAN:  Doran Heater. Veverly Fells, M.D. DATE OF BIRTH:  Jun 22, 1931  DATE OF PROCEDURE:  04/07/2015 DATE OF DISCHARGE:                              OPERATIVE REPORT   PREOPERATIVE DIAGNOSIS:  Displaced right intertrochanteric hip fracture.  POSTOPERATIVE DIAGNOSIS:  Displaced right intertrochanteric hip fracture.  PROCEDURE PERFORMED:  Right hip IM nail using Biomet Affixus short nail system for this intertrochanteric fracture.  ATTENDING SURGEON:  Doran Heater. Veverly Fells, M.D.  ASSISTANT:  Abbott Pao. Doren Custard, P.A.  ANESTHESIA:  General endotracheal anesthesia was used.  ESTIMATED BLOOD LOSS:  100 mL.  FLUID REPLACEMENT:  1200 mL crystalloids.  INSTRUMENT COUNTS:  Correct.  COMPLICATIONS:  There were no complications.  ANTIBIOTICS:  Perioperative antibiotics were given.  INDICATIONS:  The patient is an 81 year male who suffered a mechanical fall yesterday on April 06, 2015.  The patient was noted to have increased pain and inability to weight bear on his leg and was transported to the hospital today to [facility] Emergency Department for evaluation.  X-rays and clinical evaluation consistent with intertrochanteric hip fracture, displaced.  Discussed with the patient and his family.  The recommended treatment option which would be for closed IM nailing of this hip fracture to stabilize the fracture and realign it in a good position for healing and allow mobilization.  They agreed.  The patient does have dementia and a power-of-attorney signed the consent.  Informed consent obtained.  DESCRIPTION OF PROCEDURE:  After an adequate level of anesthesia was achieved, the patient was positioned in supine position.  He was placed on the fracture table.  Perineal post utilized.  Upper extremities padded and secured appropriately.  Left leg  placed in modified lithotomy position.  Right leg placed in traction with a traction boot.  We padded first the foot and the ankle and then we also used a laparotomy bandage to protect the heel and then secured the foot inside the boot with a Coban.  We then placed the leg in traction and internal rotation. Brought C-arm in to verify appropriate reduction of fracture site which reduced nicely.  This is a true intertrochanteric pattern.  We then selected the short nail system with the Biomet Affixus system.  We did a sterile prep and drape using a shower curtain.  Time-out was called.  We then entered the proximal thigh in-line with the trochanter proximal to that area.  We utilized multiplanar C-arm to localize the appropriate starting point for incision.  We then used a regular Bovie to Bovie down to the fascia which we split longitudinally in line with the incision. We then placed a Mayo scissor to open up the muscular approach there to the greater trochanter.  We then identified the greater trochanter starting point, placed a guide pin through that starting point and down the canal, verifying that with AP and lateral views of the C-arm.  We then over drilled with a step-cut drill.  We then introduced 11 mm short Affixus trochanteric entry nail for the femur.  We impacted it at the appropriate  depth.  We then placed a guide pin for our lag screw up just inferior to the center of the head on the AP and posteriorly on the lateral.  We drilled for the lag screw to 100 mm depth.  We selected 100 mm lag screw and introduced that to the appropriate subchondral bone there in the femoral head.  We had great purchase, appropriate nail length.  We then went ahead and applied a little bit of compression across the fracture as we decreased the traction on the leg.  At that point, we removed the lag screw insertion equipment and then selected the static locking screw on the jig and placed a 5.0 point  locking screw without any difficulty.  The patient's bone was extremely hard.  We had great purchase on all our hardware and the nail fit was extremely tight in the canal which gave [place] I think excellent stability to the entire construct.  We removed all our insertion handle.  We obtained final x- rays.  We were extremely pleased with the fracture reduction and the hardware placement and then concluded surgery, suturing the wounds in the following fashion.  We irrigated first with copious amounts of normal saline irrigation.  We used 0 Vicryl suture, closed the fascia over the tensor fascia lata area and then subcu closure with 2-0 Vicryl followed by staples for skin and we used subcu closure on all 3 wounds, just fascial closure on more proximal and staples on all the wounds.  We then applied sterile bandage.  The patient was transported safely to the recovery room on recovery room stretcher, having tolerated the procedure well.     Doran Heater. Veverly Fells, M.D.     SRN/MEDQ  D:  04/07/2015  T:  04/08/2015  Job:  [id number]

## 2015-04-08 NOTE — Progress Notes (Signed)
Patient Name: Troy Warren Date of Encounter: 04/08/2015     Principal Problem:   Closed intratrochanteric fracture of right femur Active Problems:   Mixed Alzheimer's and vascular dementia   Chronic combined systolic and diastolic CHF (congestive heart failure)   Permanent atrial fibrillation   Chronic kidney disease, stage III (moderate)   Hypertension associated with stage 3 chronic kidney disease due to type 2 diabetes mellitus   DM (diabetes mellitus) type II controlled with renal manifestation   Hyperlipemia   ICD, biventricular, in situ - Medtronic Protecta 2013, leads 2009   Hypothyroidism    SUBJECTIVE  Denies chest pain, leg pain, or dyspnea.  Disoriented to time/place.  CURRENT MEDS . ALPRAZolam  0.125 mg Oral QHS  .  ceFAZolin (ANCEF) IV  2 g Intravenous On Call to OR  . chlorhexidine  60 mL Topical Once  . cholecalciferol  2,000 Units Oral Daily  . citalopram  20 mg Oral Daily  . diltiazem  120 mg Oral TID  . donepezil  23 mg Oral QHS  . dorzolamide-timolol  1 drop Both Eyes BID  . enoxaparin (LOVENOX) injection  40 mg Subcutaneous Q24H  . ferrous fumarate  1 tablet Oral BID  . ferrous sulfate  325 mg Oral TID PC  . furosemide  40 mg Oral Daily  . insulin aspart      . insulin aspart  0-15 Units Subcutaneous 6 times per day  . insulin detemir  10 Units Subcutaneous QHS  . isosorbide mononitrate  90 mg Oral Daily  . labetalol  200 mg Oral BID  . lactated ringers   Intravenous STAT  . levothyroxine  75 mcg Oral QAC breakfast  . linagliptin  5 mg Oral Daily  . lisinopril  10 mg Oral Daily  . loratadine  10 mg Oral Daily  . olopatadine  1 drop Both Eyes BID  . polyethylene glycol  17 g Oral Daily  . potassium chloride  10 mEq Oral BID  . pravastatin  40 mg Oral Daily  . vitamin B-12  1,000 mcg Oral Daily    OBJECTIVE  Filed Vitals:   04/07/15 2343 04/08/15 0000 04/08/15 0400 04/08/15 0626  BP: 132/66   126/61  Pulse: 79   85  Temp: 98.4 F  (36.9 C)   97.4 F (36.3 C)  TempSrc: Axillary   Axillary  Resp:  17 16   Weight: 169 lb 12.8 oz (77.021 kg)     SpO2: 91% 94% 95% 93%    Intake/Output Summary (Last 24 hours) at 04/08/15 1013 Last data filed at 04/08/15 0458  Gross per 24 hour  Intake    940 ml  Output    675 ml  Net    265 ml   Filed Weights   04/07/15 2343  Weight: 169 lb 12.8 oz (77.021 kg)    PHYSICAL EXAM  General: Pleasant, NAD. Neuro: Alert and oriented to self. Moves all extremities spontaneously. Psych: Normal affect. HEENT:  Normal  Neck: Supple without bruits or JVD. Lungs:  Resp regular and unlabored, CTA. Heart: irreg, no s3, s4, or murmurs. Abdomen: Soft, non-tender, non-distended, BS + x 4.  Extremities: No clubbing, cyanosis or edema. DP/PT/Radials 1+ and equal bilaterally.  Accessory Clinical Findings  CBC  Recent Labs  04/07/15 1832  04/08/15 0100 04/08/15 0720  WBC 11.6*  --  13.4* 13.1*  NEUTROABS 9.6*  --   --   --   HGB 12.7*  < > 11.8* 11.4*  HCT 38.0*  < > 37.0* 35.6*  MCV 85.6  --  86.9 87.9  PLT 113*  --  114* 120*  < > = values in this interval not displayed. Basic Metabolic Panel  Recent Labs  04/07/15 1832 04/07/15 1853 04/08/15 0100  NA 138 138  --   K 4.1 4.0  --   CL 106 104  --   CO2 23  --   --   GLUCOSE 344* 347*  --   BUN 20 21  --   CREATININE 1.15 1.10 1.22  CALCIUM 8.9  --   --    Thyroid Function Tests  Recent Labs  04/08/15 0100  TSH 0.985    TELE  V paced, underlying afib.  Radiology/Studies  Dg Chest 1 View  04/07/2015   CLINICAL DATA:  Unwitnessed fall, right hip fracture, dementia  EXAM: CHEST  1 VIEW  COMPARISON:  10/24/2014  FINDINGS: Lungs are essentially clear. No frank interstitial edema. No pleural effusion or pneumothorax.  Heart is top-normal in size. Left subclavian ICD. Abandoned right subclavian pacemaker leads. Postsurgical changes related to prior CABG.  Cholecystectomy clips.  IMPRESSION: No evidence of acute  cardiopulmonary disease. No frank interstitial edema.   Electronically Signed   By: Julian Hy M.D.   On: 04/07/2015 17:31   Ct Head Wo Contrast  04/07/2015   CLINICAL DATA:  Per ems, pt with unwitnessed fall at 3am, trxfr to ED with known right hip fracture. Baseline is dementia, oriented only to self.  EXAM: CT HEAD WITHOUT CONTRAST  CT CERVICAL SPINE WITHOUT CONTRAST  TECHNIQUE: Multidetector CT imaging of the head and cervical spine was performed following the standard protocol without intravenous contrast. Multiplanar CT image reconstructions of the cervical spine were also generated.  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 No evidence of a recent cortical infarct.  Ventricles are enlarged, to a greater degree than the sulci, but stable from the prior CT. No convincing hydrocephalus.  No extra-axial masses or abnormal fluid collections.  There is no intracranial hemorrhage.  Mild sinus mucosal thickening mostly along the ethmoid air cells. Clear mastoid air cells no skull fracture.  CT CERVICAL SPINE FINDINGS  No fracture. No spondylolisthesis. Moderate loss of disc height noted at C 6- C7 and C7-T1. Mild loss disc height at C5-C6. Facet degenerative change noted bilaterally. There are multiple levels of mild diffuse spondylotic disc bulging and at least mild degrees of neural foraminal narrowing.  Bones are demineralized.  Soft tissues show carotid vascular calcifications, but no masses or enlarged lymph nodes. Lung apices are clear.  IMPRESSION: HEAD CT: No acute intracranial abnormalities. Stable appearance from the prior exam.  CERVICAL CT:  No fracture or acute finding.   Electronically Signed   By: Lajean Manes M.D.   On: 04/07/2015 17:10   Ct Cervical Spine Wo Contrast  04/07/2015   CLINICAL DATA:   Per ems, pt with unwitnessed fall at 3am, trxfr to ED with known right hip fracture. Baseline is dementia, oriented only to self.  EXAM: CT HEAD WITHOUT CONTRAST  CT CERVICAL SPINE WITHOUT CONTRAST  TECHNIQUE: Multidetector CT imaging of the head and cervical spine was performed following the standard protocol without intravenous contrast. Multiplanar CT image reconstructions of the cervical spine were also generated.  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 No evidence of a recent cortical infarct.  Ventricles are enlarged, to a greater degree than the sulci, but stable from the prior CT. No convincing hydrocephalus.  No extra-axial masses or abnormal fluid collections.  There is no intracranial hemorrhage.  Mild sinus mucosal thickening mostly along the ethmoid air cells. Clear mastoid air cells no skull fracture.  CT CERVICAL SPINE FINDINGS  No fracture. No spondylolisthesis. Moderate loss of disc height noted at C 6- C7 and C7-T1. Mild loss disc height at C5-C6. Facet degenerative change noted bilaterally. There are multiple levels of mild diffuse spondylotic disc bulging and at least mild degrees of neural foraminal narrowing.  Bones are demineralized.  Soft tissues show carotid vascular calcifications, but no masses or enlarged lymph nodes. Lung apices are clear.  IMPRESSION: HEAD CT: No acute intracranial abnormalities. Stable appearance from the prior exam.  CERVICAL CT:  No fracture or acute finding.   Electronically Signed   By: Lajean Manes M.D.   On: 04/07/2015 17:10   Dg Hip Operative Unilat With Pelvis Right  04/08/2015   CLINICAL DATA:  81 year old male status post ORIF of right intertrochanteric fracture  EXAM: OPERATIVE RIGHT HIP (WITH PELVIS IF PERFORMED) 4 VIEWS  TECHNIQUE: Fluoroscopic spot image(s)  were submitted for interpretation post-operatively.  FLUOROSCOPY TIME:  30 seconds reported  COMPARISON:  Preoperative radiographs 04/07/2015  FINDINGS: Interval ORIF of right intertrochanteric femoral fracture with intra medullary nail and cannulated transfemoral neck lag screw. Improved alignment of the fracture fragments compared to the preoperative images. No evidence of immediate hardware complication. Single distal interlocking screw. Scattered atherosclerotic vascular calcifications.  IMPRESSION: ORIF right intertrochanteric femoral fracture without evidence of hardware complication.   Electronically Signed   By: Jacqulynn Cadet M.D.   On: 04/08/2015 01:17   Dg Hips Bilat With Pelvis 3-4 Views  04/07/2015   CLINICAL DATA:  Unwitnessed fall, now transferred to the emergency department with known right hip fracture. Initial encounter.  EXAM: BILATERAL HIP (WITH PELVIS) 3-4 VIEWS  COMPARISON:  None.  FINDINGS: There is a comminuted minimally displaced right-sided intertrochanteric femur fracture with minimal foreshortening and varus angulation. No definite intra-articular extension. A prominent (approximately 1.2 cm) presumed bone island is noted within the proximal femoral diaphysis.  No left-sided hip fracture. Mild-to-moderate degenerative change of the left hip with joint space loss, subchondral sclerosis and osteophytosis. No evidence of avascular necrosis. Note is made of a punctate (approximately 1 cm) presumed bone island within the left acetabulum as well as a smaller (approximately 5 mm) suspected bone island within the left femoral head.  No definite displaced pelvic fracture.  Vascular calcifications are noted within the pelvis and upper aspects of the thigh bilaterally. No radiopaque foreign body.  IMPRESSION: 1. Acute minimally displaced right-sided intertrochanteric femur fracture without definite intra-articular extension. 2. No left-sided femur fracture.   Electronically Signed   By: Sandi Mariscal M.D.   On: 04/07/2015 17:29   ASSESSMENT AND PLAN  1. R Femur Fx:  S/p IM nail yesterday.  Per ortho.  Denies pain this AM.  2.  Chronic combined systolic/diastolic CHF/ICM: EF 37-62% with Gr 3 DD (03/2014).  Euvolemic on exam.  Watch IVF closely.  Cont bb/acei.  Daily weights.  Of note, though not ideal, he is on dilt long term for afib rate control.  3.  Permanent Afib:  Rate controlled.  CHA2DS2VASc = 5.  No OAC 2/2 dementia/falls.  4.  HTN: stable.  5.  HL:  Cont statin.  LDL 54 in 10/2014.  6.  CKD III: stable.  7.  DMII:  Per IM.  Signed, Murray Hodgkins NP  Patient seen, examined. Available data reviewed. Agree with findings, assessment, and plan as outlined by Ignacia Bayley, NP. The patient is awake and alert. He is not oriented but this is his baseline per review of records. Lung fields are clear. Heart is regular rate and rhythm. There is no peripheral edema. Telemetry shows ventricular pacing. I agree with the documentation above by Ignacia Bayley. The patient's cardiac condition appears stable. I am reviewed his medications and would continue them without change. He has not a candidate for anticoagulation. Please call if we can be of any further assistance.  Sherren Mocha, M.D. 04/08/2015 11:37 AM

## 2015-04-08 NOTE — Care Management Note (Signed)
CARE MANAGEMENT NOTE 04/08/2015  Patient:  Troy Warren   Account Number:  [id number]  Date Initiated:  04/08/2015  Documentation initiated by:  Ricki Miller  Subjective/Objective Assessment:   81 yr old male admitted with osteoarthritis of the left knee. patient underwent a left total knee arthroplasty.     Action/Plan:   Patient requires shorrtterm rehab at SNF.   Anticipated DC Date:  04/10/2015   Anticipated DC Plan:  [place]  CM consult      Choice offered to / List presented to:     DME arranged  NA        HH arranged  NA      Status of service:  In process, will continue to follow

## 2015-04-09 DIAGNOSIS — S72141S Displaced intertrochanteric fracture of right femur, sequela: Secondary | ICD-10-CM

## 2015-04-09 DIAGNOSIS — E785 Hyperlipidemia, unspecified: Secondary | ICD-10-CM

## 2015-04-09 LAB — BASIC METABOLIC PANEL
Anion gap: 8 (ref 5–15)
BUN: 26 mg/dL — ABNORMAL HIGH (ref 6–23)
CO2: 24 mmol/L (ref 19–32)
Calcium: 8.3 mg/dL — ABNORMAL LOW (ref 8.4–10.5)
Chloride: 101 mmol/L (ref 96–112)
Creatinine, Ser: 1.37 mg/dL — ABNORMAL HIGH (ref 0.50–1.35)
GFR calc non Af Amer: 47 mL/min — ABNORMAL LOW (ref >90)
GFR, EST AFRICAN AMERICAN: 54 mL/min — AB (ref >90)
Glucose, Bld: 253 mg/dL — ABNORMAL HIGH (ref 70–99)
Potassium: 4.5 mmol/L (ref 3.5–5.1)
SODIUM: 133 mmol/L — AB (ref 135–145)

## 2015-04-09 LAB — GLUCOSE, CAPILLARY
GLUCOSE-CAPILLARY: 253 mg/dL — AB (ref 70–99)
Glucose-Capillary: 218 mg/dL — ABNORMAL HIGH (ref 70–99)
Glucose-Capillary: 149 mg/dL — ABNORMAL HIGH (ref 70–99)
Glucose-Capillary: 149 mg/dL — ABNORMAL HIGH (ref 70–99)

## 2015-04-09 LAB — CBC
HCT: 32.1 % — ABNORMAL LOW (ref 39.0–52.0)
Hemoglobin: 10.3 g/dL — ABNORMAL LOW (ref 13.0–17.0)
MCH: 28.8 pg (ref 26.0–34.0)
MCHC: 32.1 g/dL (ref 30.0–36.0)
MCV: 89.7 fL (ref 78.0–100.0)
PLATELETS: 105 10*3/uL — AB (ref 150–400)
RBC: 3.58 MIL/uL — ABNORMAL LOW (ref 4.22–5.81)
RDW: 17 % — AB (ref 11.5–15.5)
WBC: 11.4 10*3/uL — ABNORMAL HIGH (ref 4.0–10.5)

## 2015-04-09 LAB — HEMOGLOBIN A1C
Hgb A1c MFr Bld: 8 % — ABNORMAL HIGH (ref 4.8–5.6)
MEAN PLASMA GLUCOSE: 183 mg/dL

## 2015-04-09 NOTE — Progress Notes (Signed)
Patient unable to void spontaneously post foley catheter removal. In and out cath X2 performed. Bladder scan revealed 367ml of urine. Per protocol will insert foley catheter for urinary retention.

## 2015-04-09 NOTE — Progress Notes (Signed)
[facility] PROGRESS NOTE  Troy Warren ZRA:[id number] DOB: 1931/05/01 DOA: 04/07/2015 PCP: Alesia Richards, MD  Assessment/Plan: #1 displaced right intertrochanteric hip fracture  Secondary to mechanical fall. Patient is status post right hip IM nail per Dr. Veverly Fells 54/56/2563 with no complications.  - Continue pain management.  - PT/OT has recommended SNF  #2 chronic kidney disease stage III - S creatinine at 1.3, stable. Continue to monitor  #3 hypertension - Stable. Continue home regimen of Cardizem, Lasix, labetalol, lisinopril, Imdur.  #4 diabetes mellitus - Continue trajenta, sliding scale insulin.  #5 hypothyroidism - TSH within normal limits as 0.985. Continue Synthroid.  #6 chronic combined systolic/diastolic CHF/ICM EF of 89-37% Stable. Patient currently compensated. Continue home regimen of Lasix, labetalol, lisinopril, Imdur, Cardizem.  #7 atrial fibrillation CHADS2VASC= 5. Currently rate controlled on labetalol and Cardizem. Patient with history of dementia and falls as such not a candidate for coumadin  #8 hyperlipidemia Continue statin.  #9 dementia Stable, Continue Aricept.  #10 anemia Follow H&H. Continue iron supplementation.  #11 prophylaxis Lovenox for DVT prophylaxis.   Code Status: Full Family Communication: d/c with patient Disposition Plan: SNF within next 1-2 days   Consultants:  Orthopedics: Dr. Netta Cedars 04/07/2015  Cardiology: Dr. Jules Husbands 04/07/2015  Procedures:  Chest x-ray 04/07/2015  CT head 04/07/2015  CT C-spine 04/07/2015  Plain films of bilateral hips 04/07/2015  Right hip IM nail. Dr. Veverly Fells 04/07/2015  Antibiotics:  None  HPI/Subjective: Patient has no new complaints  Objective: Filed Vitals:   04/09/15 1417  BP: 108/46  Pulse: 61  Temp: 98.1 F (36.7 C)  Resp: 16    Intake/Output Summary (Last 24 hours) at 04/09/15 1723 Last data filed at 04/09/15 1013  Gross per 24 hour   Intake    720 ml  Output      0 ml  Net    720 ml   Filed Weights   04/07/15 2343 04/09/15 0502  Weight: 77.021 kg (169 lb 12.8 oz) 79.017 kg (174 lb 3.2 oz)    Exam:   General:  NAD, alert and awake  Cardiovascular: RRR  Respiratory: CTAB, no wheezes  Abdomen: Soft, nontender, nondistended, positive bowel sounds.  Musculoskeletal: No clubbing cyanosis or edema.  Data Reviewed: Basic Metabolic Panel:  Recent Labs Lab 04/07/15 1832 04/07/15 1853 04/08/15 0100 04/08/15 0720 04/09/15 0620  NA 138 138  --  142 133*  K 4.1 4.0  --  3.9 4.5  CL 106 104  --  108 101  CO2 23  --   --  27 24  GLUCOSE 344* 347*  --  134* 253*  BUN 20 21  --  18 26*  CREATININE 1.15 1.10 1.22 1.11 1.37*  CALCIUM 8.9  --   --  8.7 8.3*   Liver Function Tests: No results for input(s): AST, ALT, ALKPHOS, BILITOT, PROT, ALBUMIN in the last 168 hours. No results for input(s): LIPASE, AMYLASE in the last 168 hours. No results for input(s): AMMONIA in the last 168 hours. CBC:  Recent Labs Lab 04/07/15 1832 04/07/15 1853 04/08/15 0100 04/08/15 0720 04/09/15 0620  WBC 11.6*  --  13.4* 13.1* 11.4*  NEUTROABS 9.6*  --   --   --   --   HGB 12.7* 12.9* 11.8* 11.4* 10.3*  HCT 38.0* 38.0* 37.0* 35.6* 32.1*  MCV 85.6  --  86.9 87.9 89.7  PLT 113*  --  114* 120* 105*   Cardiac Enzymes: No results for input(s): CKTOTAL, CKMB, CKMBINDEX,  TROPONINI in the last 168 hours. BNP (last 3 results) No results for input(s): BNP in the last 8760 hours.  ProBNP (last 3 results)  Recent Labs  10/24/14 0845  PROBNP 2991.0*    CBG:  Recent Labs Lab 04/08/15 1608 04/08/15 2156 04/09/15 0626 04/09/15 1117 04/09/15 1627  GLUCAP 161* 264* 218* 253* 149*    Recent Results (from the past 240 hour(s))  Surgical pcr screen     Status: None   Collection Time: 04/07/15 11:39 PM  Result Value Ref Range Status   MRSA, PCR NEGATIVE NEGATIVE Final   Staphylococcus aureus NEGATIVE NEGATIVE Final     Comment:        The Xpert SA Assay (FDA approved for NASAL specimens in patients over 78 years of age), is one component of a comprehensive surveillance program.  Test performance has been validated by [facility] for patients greater than or equal to 28 year old. It is not intended to diagnose infection nor to guide or monitor treatment.      Studies: Dg Hip Operative Unilat With Pelvis Right  04/08/2015   CLINICAL DATA:  81 year old male status post ORIF of right intertrochanteric fracture  EXAM: OPERATIVE RIGHT HIP (WITH PELVIS IF PERFORMED) 4 VIEWS  TECHNIQUE: Fluoroscopic spot image(s) were submitted for interpretation post-operatively.  FLUOROSCOPY TIME:  30 seconds reported  COMPARISON:  Preoperative radiographs 04/07/2015  FINDINGS: Interval ORIF of right intertrochanteric femoral fracture with intra medullary nail and cannulated transfemoral neck lag screw. Improved alignment of the fracture fragments compared to the preoperative images. No evidence of immediate hardware complication. Single distal interlocking screw. Scattered atherosclerotic vascular calcifications.  IMPRESSION: ORIF right intertrochanteric femoral fracture without evidence of hardware complication.   Electronically Signed   By: Jacqulynn Cadet M.D.   On: 04/08/2015 01:17    Scheduled Meds: . ALPRAZolam  0.125 mg Oral QHS  . chlorhexidine  60 mL Topical Once  . cholecalciferol  2,000 Units Oral Daily  . citalopram  20 mg Oral Daily  . diltiazem  120 mg Oral TID  . donepezil  23 mg Oral QHS  . dorzolamide-timolol  1 drop Both Eyes BID  . enoxaparin (LOVENOX) injection  40 mg Subcutaneous Q24H  . feeding supplement (GLUCERNA SHAKE)  237 mL Oral BID BM  . ferrous fumarate  1 tablet Oral BID  . ferrous sulfate  325 mg Oral TID PC  . furosemide  40 mg Oral Daily  . insulin aspart  0-15 Units Subcutaneous TID WC  . insulin detemir  10 Units Subcutaneous QHS  . isosorbide mononitrate  90 mg Oral Daily  .  labetalol  200 mg Oral BID  . levothyroxine  75 mcg Oral QAC breakfast  . linagliptin  5 mg Oral Daily  . lisinopril  10 mg Oral Daily  . loratadine  10 mg Oral Daily  . olopatadine  1 drop Both Eyes BID  . polyethylene glycol  17 g Oral Daily  . potassium chloride  10 mEq Oral BID  . pravastatin  40 mg Oral Daily  . vitamin B-12  1,000 mcg Oral Daily   Continuous Infusions: . sodium chloride    . sodium chloride 50 mL/hr at 04/07/15 2334  . sodium chloride      Principal Problem:   Closed intratrochanteric fracture of right femur Active Problems:   Hyperlipemia   Permanent atrial fibrillation   ICD, biventricular, in situ - Medtronic Protecta 2013, leads 2009   Hypothyroidism   Chronic kidney disease,  stage III (moderate)   Hypertension associated with stage 3 chronic kidney disease due to type 2 diabetes mellitus   DM (diabetes mellitus) type II controlled with renal manifestation   Mixed Alzheimer's and vascular dementia   Chronic combined systolic and diastolic CHF (congestive heart failure)    Time spent: 35 minutes    Velvet Bathe MD [facility] Pager [phone]. If 7PM-7AM, please contact night-coverage at [email or web address], password [facility] 04/09/2015, 5:23 PM  LOS: 2 days

## 2015-04-09 NOTE — Clinical Social Work Note (Signed)
Patient is from [facility] and will return (per family and patient) once medically discharged.  Nonnie Done, LCSW [phone]  Psychiatric & Orthopedics (5N 1-8) Clinical Social Worker

## 2015-04-09 NOTE — Anesthesia Postprocedure Evaluation (Signed)
Anesthesia Post Note  Patient: Troy Warren  Procedure(s) Performed: Procedure(s) (LRB): INTRAMEDULLARY (IM) NAIL INTERTROCHANTRIC (Right)  Anesthesia type: General  Patient location: PACU  Post pain: Pain level controlled  Post assessment: Post-op Vital signs reviewed  Last Vitals: BP 124/52 mmHg  Pulse 86  Temp(Src) 36.4 C (Axillary)  Resp 16  Wt 174 lb 3.2 oz (79.017 kg)  SpO2 91%  Post vital signs: Reviewed  Level of consciousness: sedated  Complications: No apparent anesthesia complications

## 2015-04-09 NOTE — Clinical Social Work Note (Signed)
Clinical Social Work Assessment  Patient Details  Name: Troy Warren MRN: [id number] Date of Birth: 07/15/1931  Date of referral:  04/09/15               Reason for consult:  Facility Placement (From [facility])                Permission sought to share information with:  Facility Sport and exercise psychologist, Family Supports Permission granted to share information::  Yes, Verbal Permission Granted  Name::      (Daughters and Wife)  Agency::   ([facility])  Relationship::     Contact Information:     Housing/Transportation Living arrangements for the past 2 months:  [place] of Information:  Adult Children (Daughter Debbie) Patient Interpreter Needed:    Criminal Activity/Legal Involvement Pertinent to Current Situation/Hospitalization:    Significant Relationships:  None Lives with:  Facility Resident Do you feel safe going back to the place where you live?  Yes Need for family participation in patient care:  Yes (Comment) ([facility])  Care giving concerns:  Family is relieved to have patient already a resident at [facility] where patient can return to receive STR.   Social Worker assessment / plan:  CSW will assist patient and family with disposition plans and return to SNF at time of discharge.  Family states that patient will need PTAR transportation to [facility] once medically discharged.  CSW will arranged.   Employment status:  Retired Nurse, adult PT Recommendations:  [place] / Referral to community resources:  Acute Rehab  Patient/Family's Response to care:  Family and patient are all agreeable for patient to return to [facility] at time of discharge.  Family is appreciative of CSW assistance and support while in hospital.  Patient/Family's Understanding of and Emotional Response to Diagnosis, Current Treatment, and Prognosis:  Family is very realistic regarding patient's  diagnoses and prognosis.  Family is relieved to have support from SNF to assist with daily cares for patient.  Family is supportive of patient's needs and involved in patient's care both in the hospital and at [facility].    Emotional Assessment Appearance:  Appears stated age Attitude/Demeanor/Rapport:   (appropriate) Affect (typically observed):  Pleasant, Appropriate Orientation:  Oriented to Self Alcohol / Substance use:  Not Applicable Psych involvement (Current and /or in the community):  No (Comment)  Discharge Needs  Concerns to be addressed:  No discharge needs identified Readmission within the last 30 days:  No Current discharge risk:  None Barriers to Discharge:  No Barriers Identified   Dulcy Fanny, LCSW 04/09/2015, 1:55 PM

## 2015-04-09 NOTE — Evaluation (Signed)
Physical Therapy Evaluation Patient Details Name: Troy Warren MRN: [id number] DOB: 30-Dec-1930 Today's Date: 04/09/2015   History of Present Illness  Pt is a 81 y/o M s/p fall and R hip fx.  Now s/p R IM nail.  Pt's PMH includes HTN, depression, dementia, CAD, chronic a fib, R pleural effusion, and emphysema.  Clinical Impression  Patient is s/p above surgery resulting in functional limitations due to the deficits listed below (see PT Problem List). Pt unable to follow one step commands consistently and session limited to sitting EOB w/ max A for maintaining sitting balance.  Pt required max A x2 for all bed mobility.  Patient will benefit from skilled PT to increase their independence and safety with mobility to allow discharge to the venue listed below.       Follow Up Recommendations SNF;Supervision/Assistance - 24 hour    Equipment Recommendations   (TBD by next venue of care)    Recommendations for Other Services       Precautions / Restrictions Precautions Precautions: Fall Restrictions Weight Bearing Restrictions: Yes RLE Weight Bearing: Weight bearing as tolerated      Mobility  Bed Mobility Overal bed mobility: Needs Assistance;+2 for physical assistance Bed Mobility: Supine to Sit     Supine to sit: Max assist;+2 for physical assistance;HOB elevated     General bed mobility comments: Max A w/ HOB elevated, verbal and tactile cues, to achieve sitting EOB.  Pt w/ one hand on bed rail and one HHA w/ nurse tech to pull to sitting.  Pt unable to maintain balance sitting EOB w/ posterior lean.    Transfers                 General transfer comment: Did not attempt transfer this session  Ambulation/Gait                Stairs            Wheelchair Mobility    Modified Rankin (Stroke Patients Only)       Balance Overall balance assessment: Needs assistance Sitting-balance support: Bilateral upper extremity supported;Feet  supported Sitting balance-Leahy Scale: Zero   Postural control: Posterior lean;Right lateral lean                                   Pertinent Vitals/Pain Pain Assessment: Faces Faces Pain Scale: Hurts even more Pain Location: pt unable to specify; likely  Pain Descriptors / Indicators: Moaning Pain Intervention(s): Limited activity within patient's tolerance;Monitored during session;Repositioned    Home Living Family/patient expects to be discharged to:: Skilled nursing facility (pt from SNF)                      Prior Function Level of Independence:  (Unclear, pt poor historian 2/2 dementia)               Hand Dominance   Dominant Hand: Right    Extremity/Trunk Assessment               Lower Extremity Assessment: RLE deficits/detail RLE Deficits / Details: limited ROM and strength 2/2 R hip fx and IM nail       Communication   Communication: No difficulties  Cognition Arousal/Alertness: Awake/alert Behavior During Therapy: Agitated Overall Cognitive Status: History of cognitive impairments - at baseline Area of Impairment: Orientation;Following commands;Safety/judgement Orientation Level: Disoriented to;Person;Place;Time;Situation (Pt says he still lives in [place], [place]  w/ his wife)   Memory: Decreased short-term memory Following Commands: Follows one step commands inconsistently Safety/Judgement: Decreased awareness of safety;Decreased awareness of deficits     General Comments: Pt's cognitive status limited PT session.  Pt unable to consistently follow one step commands and refusing to participate fully in therapy 2/2 pain in R hip.    General Comments      Exercises Total Joint Exercises Ankle Circles/Pumps: AROM;AAROM;Both;10 reps;Supine Heel Slides: AAROM;Both;10 reps;Supine Hip ABduction/ADduction: AAROM;Right;10 reps;Supine Straight Leg Raises: AAROM;Both;10 reps;Supine      Assessment/Plan    PT Assessment  Patient needs continued PT services  PT Diagnosis Difficulty walking;Abnormality of gait;Generalized weakness;Acute pain   PT Problem List Decreased strength;Decreased range of motion;Decreased activity tolerance;Decreased balance;Decreased mobility;Decreased coordination;Decreased knowledge of use of DME;Decreased cognition;Decreased safety awareness;Decreased knowledge of precautions;Cardiopulmonary status limiting activity;Decreased skin integrity;Pain  PT Treatment Interventions DME instruction;Gait training;Functional mobility training;Therapeutic activities;Therapeutic exercise;Balance training;Cognitive remediation;Neuromuscular re-education;Patient/family education;Modalities   PT Goals (Current goals can be found in the Care Plan section) Acute Rehab PT Goals Patient Stated Goal: none stated PT Goal Formulation: Patient unable to participate in goal setting Time For Goal Achievement: 04/16/15 Potential to Achieve Goals: Fair    Frequency Min 3X/week   Barriers to discharge        Co-evaluation               End of Session Equipment Utilized During Treatment: Oxygen Activity Tolerance: Treatment limited secondary to agitation;Patient limited by pain Patient left: in bed;with call bell/phone within reach;with bed alarm set Nurse Communication: Mobility status;Precautions;Weight bearing status         Time: 1540-0867 PT Time Calculation (min) (ACUTE ONLY): 16 min   Charges:   PT Evaluation $Initial PT Evaluation Tier I: 1 Procedure     PT G CodesJoslyn Hy PT, DPT [phone] Pager: [phone] 04/09/2015, 2:34 PM

## 2015-04-09 NOTE — Progress Notes (Signed)
Inpatient Diabetes Program Recommendations  AACE/ADA: New Consensus Statement on Inpatient Glycemic Control (2013)  Target Ranges:  Prepandial:   less than 140 mg/dL      Peak postprandial:   less than 180 mg/dL (1-2 hours)      Critically ill patients:  140 - 180 mg/dL     Results for Troy Warren, Troy Warren (MRN [id number]) as of 04/09/2015 13:17  Ref. Range 04/09/2015 06:26 04/09/2015 11:17  Glucose-Capillary Latest Ref Range: 70-99 mg/dL 218 (H) 253 (H)     Current DM Orders: Levemir 10 units QHS            Novolog Moderate SSI            Tradjenta 5 mg daily     **Patient ate 50% of lunch today.  **Still having elevated fasting glucose levels today.    MD- Please consider increasing Levemir to 15 units QHS  If patient continues to have elevated postprandial glucose levels, please also consider adding low dose Novolog Meal Coverage- Novolog 3 units tid with meals     Will follow Wyn Quaker RN, MSN, CDE Diabetes Coordinator Inpatient Diabetes Program Team Pager: [phone] (8a-5p)

## 2015-04-09 NOTE — Progress Notes (Signed)
   Subjective: 2 Days Post-Op Procedure(s) (LRB): INTRAMEDULLARY (IM) NAIL INTERTROCHANTRIC (Right)  PT alert and awake c/o mild soreness in the right hip Denies any new symptoms or issues Patient reports pain as mild.  Objective:   VITALS:   Filed Vitals:   04/09/15 0502  BP: 124/52  Pulse: 86  Temp: 97.5 F (36.4 C)  Resp: 16    Right hip incision healing well nv intact distally No rashes or drainage   LABS  Recent Labs  04/08/15 0100 04/08/15 0720 04/09/15 0620  HGB 11.8* 11.4* 10.3*  HCT 37.0* 35.6* 32.1*  WBC 13.4* 13.1* 11.4*  PLT 114* 120* PENDING     Recent Labs  04/07/15 1853 04/08/15 0100 04/08/15 0720 04/09/15 0620  NA 138  --  142 133*  K 4.0  --  3.9 4.5  BUN 21  --  18 26*  CREATININE 1.10 1.22 1.11 1.37*  GLUCOSE 347*  --  134* 253*     Assessment/Plan: 2 Days Post-Op Procedure(s) (LRB): INTRAMEDULLARY (IM) NAIL INTERTROCHANTRIC (Right) Continue PT/OT D/c planning Will continue to monitor   Merla Riches, MPAS, PA-C  04/09/2015, 8:55 AM

## 2015-04-10 ENCOUNTER — Encounter: Payer: Self-pay | Admitting: Registered Nurse

## 2015-04-10 ENCOUNTER — Non-Acute Institutional Stay (SKILLED_NURSING_FACILITY): Payer: Medicare Other | Admitting: Registered Nurse

## 2015-04-10 DIAGNOSIS — S72141S Displaced intertrochanteric fracture of right femur, sequela: Secondary | ICD-10-CM | POA: Diagnosis not present

## 2015-04-10 DIAGNOSIS — F0280 Dementia in other diseases classified elsewhere without behavioral disturbance: Secondary | ICD-10-CM

## 2015-04-10 DIAGNOSIS — E039 Hypothyroidism, unspecified: Secondary | ICD-10-CM

## 2015-04-10 DIAGNOSIS — K5901 Slow transit constipation: Secondary | ICD-10-CM

## 2015-04-10 DIAGNOSIS — F418 Other specified anxiety disorders: Secondary | ICD-10-CM | POA: Diagnosis not present

## 2015-04-10 DIAGNOSIS — D62 Acute posthemorrhagic anemia: Secondary | ICD-10-CM

## 2015-04-10 DIAGNOSIS — H409 Unspecified glaucoma: Secondary | ICD-10-CM

## 2015-04-10 DIAGNOSIS — F0150 Vascular dementia without behavioral disturbance: Secondary | ICD-10-CM

## 2015-04-10 DIAGNOSIS — G309 Alzheimer's disease, unspecified: Secondary | ICD-10-CM

## 2015-04-10 DIAGNOSIS — I4821 Permanent atrial fibrillation: Secondary | ICD-10-CM

## 2015-04-10 DIAGNOSIS — E785 Hyperlipidemia, unspecified: Secondary | ICD-10-CM

## 2015-04-10 DIAGNOSIS — I482 Chronic atrial fibrillation: Secondary | ICD-10-CM

## 2015-04-10 DIAGNOSIS — I5022 Chronic systolic (congestive) heart failure: Secondary | ICD-10-CM

## 2015-04-10 DIAGNOSIS — I10 Essential (primary) hypertension: Secondary | ICD-10-CM

## 2015-04-10 DIAGNOSIS — E1129 Type 2 diabetes mellitus with other diabetic kidney complication: Secondary | ICD-10-CM

## 2015-04-10 DIAGNOSIS — N058 Unspecified nephritic syndrome with other morphologic changes: Secondary | ICD-10-CM

## 2015-04-10 LAB — GLUCOSE, CAPILLARY
Glucose-Capillary: 123 mg/dL — ABNORMAL HIGH (ref 70–99)
Glucose-Capillary: 172 mg/dL — ABNORMAL HIGH (ref 70–99)

## 2015-04-10 LAB — CBC
HCT: 31.9 % — ABNORMAL LOW (ref 39.0–52.0)
HEMOGLOBIN: 10.3 g/dL — AB (ref 13.0–17.0)
MCH: 29 pg (ref 26.0–34.0)
MCHC: 32.3 g/dL (ref 30.0–36.0)
MCV: 89.9 fL (ref 78.0–100.0)
PLATELETS: 118 10*3/uL — AB (ref 150–400)
RBC: 3.55 MIL/uL — ABNORMAL LOW (ref 4.22–5.81)
RDW: 17.1 % — ABNORMAL HIGH (ref 11.5–15.5)
WBC: 10.9 10*3/uL — ABNORMAL HIGH (ref 4.0–10.5)

## 2015-04-10 MED ORDER — FUROSEMIDE 40 MG PO TABS: 40.0000 mg | ORAL_TABLET | Freq: Every day | ORAL | Status: DC

## 2015-04-10 MED ORDER — BISACODYL 10 MG RE SUPP: 10.0000 mg | Freq: Every day | RECTAL | Status: AC | PRN

## 2015-04-10 NOTE — Clinical Social Work Note (Signed)
CSW spoke with the pt's daughter Jackelyn Poling. CSW informed Jackelyn Poling that the pt will be discharge back to [facility] today. CSW  discussed ambulance transport. CSW will be contacted [facility] at [phone] to schedule transport for the pt. CSW upload the pt's discharge summary. Bedside RN can call reported to [phone].   [place], MSW, [place]

## 2015-04-10 NOTE — Discharge Summary (Signed)
Physician Discharge Summary  Troy Warren MAU:[id number] DOB: [birth year] DOA: 04/07/2015  PCP: Alesia Richards, MD  Admit date: 04/07/2015 Discharge date: 04/10/2015  Time spent: > 35 minutes  Recommendations for Outpatient Follow-up:  1. Please monitor hemoglobin levels 2. Also within the next week or to reassess serum creatinine and sodium levels. Lasix on discharge for 2 days  Discharge Diagnoses:  Principal Problem:   Closed intratrochanteric fracture of right femur Active Problems:   Hyperlipemia   Permanent atrial fibrillation   ICD, biventricular, in situ - Med[employer]onic Protecta 2013, leads 2009   Hypothyroidism   Chronic kidney disease, stage III (moderate)   Hypertension associated with stage 3 chronic kidney disease due to type 2 diabetes mellitus   DM (diabetes mellitus) type II controlled with renal manifestation   Mixed Alzheimer's and vascular dementia   Chronic combined systolic and diastolic CHF (congestive heart failure)   Discharge Condition: Stable  Diet recommendation: Heart healthy  Filed Weights   04/07/15 2343 04/09/15 0502 04/10/15 0659  Weight: 77.021 kg (169 lb 12.8 oz) 79.017 kg (174 lb 3.2 oz) 78.654 kg (173 lb 6.4 oz)    History of present illness:  Based off of original history of present illness: 81 y.o. male presents with a broken hip. The family states that he fell last night and scraped his elbow. Patients famikly states that they received a call last night about this. This morning the facility tried to give him a bath and he was not able to bear weight on the right side. The facility did an X ray and this revealed a fracture of the right hip.  Hospital Course:  #1 displaced right intertrochanteric hip fracture  Secondary to mechanical fall. Patient is status post right hip IM nail per Dr. Veverly Fells [id number]/[id number] with no complications.  - Continue pain management.  - PT/OT has recommended SNF - Will be anticoagulated with Lovenox on  discharge  #2 chronic kidney disease stage III - S creatinine at 1.3, stable. Continue to monitor  #3 hypertension - Stable. Continue home regimen of Cardizem, Lasix, labetalol, lisinopril, Imdur.  #4 diabetes mellitus - Continue trajenta, sliding scale insulin.  #5 hypothyroidism - TSH within normal limits as 0.985. Continue Synthroid.  #6 chronic combined systolic/diastolic CHF/ICM EF of 42-87% Stable. Patient currently compensated. Continue home regimen of Lasix, labetalol, lisinopril, Imdur, Cardizem. - Given mild elevation in serum creatinine of 1.3 and increased BUN will hold Lasix on discharge for 2 days then may restart Lasix  #7 atrial fibrillation CHADS2VASC= 5. Currently rate controlled on labetalol and Cardizem. Patient with history of dementia and falls as such not a candidate for coumadin  #8 hyperlipidemia Continue statin.  #9 dementia Stable, Continue Aricept.  #10 anemia Follow H&H. Continue iron supplementation.  #11 prophylaxis Lovenox for DVT prophylaxis.   Procedures:  Intramedullary IM nail intertrochanteric  Consultations:  Netta Cedars  Discharge Exam: Filed Vitals:   04/10/15 0400  BP:   Pulse:   Temp:   Resp: 17    General: Pt in nad, alert and awake Cardiovascular: no cyanosis, pink extremities Respiratory: no wheezes, equal chest rise  Discharge Instructions   Discharge Instructions    Call MD for:  difficulty breathing, headache or visual disturbances    Complete by:  As directed      Call MD for:  severe uncontrolled pain    Complete by:  As directed      Call MD for:  temperature >100.4    Complete by:  As directed      Diet - low sodium heart healthy    Complete by:  As directed      Discharge instructions    Complete by:  As directed   Please be sure to follow up with your orthopaedic surgeon in 1-2 weeks or as per their specifications based on their conversation with you prior to discharge.     Increase activity  slowly    Complete by:  As directed      Weight bearing as tolerated    Complete by:  As directed   Laterality:  right  Extremity:  Lower     Weight bearing as tolerated    Complete by:  As directed   Laterality:  right  Extremity:  Lower          Current Discharge Medication List    START taking these medications   Details  acetaminophen (TYLENOL) 325 MG tablet Take 1-2 tablets (325-650 mg total) by mouth every 6 (six) hours as needed for moderate pain. Qty: 60 tablet, Refills: 1    bisacodyl (DULCOLAX) 10 MG suppository Place 1 suppository (10 mg total) rectally daily as needed for moderate constipation. Qty: 12 suppository, Refills: 0    enoxaparin (LOVENOX) 40 MG/0.4ML injection Inject 0.4 mLs (40 mg total) into the skin daily. 30 days post op Qty: 30 mL, Refills: 0      CONTINUE these medications which have CHANGED   Details  furosemide (LASIX) 40 MG tablet Take 1 tablet (40 mg total) by mouth daily. Qty: 30 tablet, Refills: 9      CONTINUE these medications which have NOT CHANGED   Details  ALPRAZolam (XANAX) 0.25 MG tablet Take 0.125 mg by mouth at bedtime.    Cholecalciferol (VITAMIN D3) 2000 UNITS TABS Take 2 tablets by mouth daily.     citalopram (CELEXA) 40 MG tablet Take 0.5 tablets (20 mg total) by mouth daily. Qty: 90 tablet, Refills: 3   Associated Diagnoses: Depression with anxiety    Cyanocobalamin (VITAMIN B-12 PO) Take 1,000 mcg by mouth daily.     diltiazem (CARDIZEM) 120 MG tablet Take 1 tablet (120 mg total) by mouth 3 (three) times daily. Qty: 270 tablet, Refills: 3    donepezil (ARICEPT) 23 MG TABS tablet Take 1 tablet (23 mg total) by mouth at bedtime. Qty: 60 tablet, Refills: 1    dorzolamide-timolol (COSOPT) 22.3-6.8 MG/ML ophthalmic solution Place 1 drop into both eyes 2 (two) times daily.     ferrous fumarate (HEMOCYTE - 106 MG FE) 325 (106 FE) MG TABS Take 1 tablet by mouth 2 (two) times daily.    insulin detemir (LEVEMIR) 100  UNIT/ML injection Inject 10 Units into the skin at bedtime.     isosorbide mononitrate (IMDUR) 60 MG 24 hr tablet Take 90 mg by mouth daily. Pt takes 1and 1/2 of the 60 mg pill to equal 90 mg.    KLOR-CON M10 10 MEQ tablet TAKE 1 TABLET TWICE DAILY Qty: 180 tablet, Refills: 0    labetalol (NORMODYNE) 200 MG tablet TAKE 1 TABLET TWICE A DAY Qty: 180 tablet, Refills: 99    levothyroxine (SYNTHROID, LEVOTHROID) 75 MCG tablet TAKE 1 TABLET EVERY DAY Qty: 90 tablet, Refills: 3    linagliptin (TRADJENTA) 5 MG TABS tablet Take 5 mg by mouth daily.    lisinopril (PRINIVIL,ZESTRIL) 10 MG tablet Take 10 mg by mouth daily.    loratadine (CLARITIN) 10 MG tablet Take 10 mg by mouth daily.  Olopatadine HCl 0.2 % SOLN Place 1 drop into both eyes 2 (two) times daily.    polyethylene glycol (MIRALAX / GLYCOLAX) packet Take 17 g by mouth daily.    pravastatin (PRAVACHOL) 40 MG tablet TAKE 1 TABLET EVERY DAY FOR CHOLESTEROL Qty: 90 tablet, Refills: 3    insulin lispro (HUMALOG) 100 UNIT/ML injection Inject 5-10 Units into the skin 3 (three) times daily before meals. 5 units for cbg 250-350 8 units for cbg >350-450 10 units for cbg>450      STOP taking these medications     aspirin 81 MG chewable tablet        Allergies  Allergen Reactions  . Lipitor [Atorvastatin]     weakness   Follow-up Information    Follow up with NORRIS,STEVEN R, MD. Call in 2 weeks.   Specialty:  Orthopedic Surgery   Why:  [phone]   Contact information:   [address] [place] [address] [place] [address] [phone]        The results of significant diagnostics from this hospitalization (including imaging, microbiology, ancillary and laboratory) are listed below for reference.    Significant Diagnostic Studies: Dg Chest 1 View  04/07/2015   CLINICAL DATA:  Unwitnessed fall, right hip fracture, dementia  EXAM: CHEST  1 VIEW  COMPARISON:  10/24/2014  FINDINGS: Lungs are essentially clear. No frank  interstitial edema. No pleural effusion or pneumothorax.  Heart is top-normal in size. Left subclavian ICD. Abandoned right subclavian pacemaker leads. Postsurgical changes related to prior CABG.  Cholecystectomy clips.  IMPRESSION: No evidence of acute cardiopulmonary disease. No frank interstitial edema.   Electronically Signed   By: Julian Hy M.D.   On: 04/07/2015 17:31   Ct Head Wo Contrast  04/07/2015   CLINICAL DATA:  Per ems, pt with unwitnessed fall at 3am, trxfr to ED with known right hip fracture. Baseline is dementia, oriented only to self.  EXAM: CT HEAD WITHOUT CONTRAST  CT CERVICAL SPINE WITHOUT CONTRAST  TECHNIQUE: Multidetector CT imaging of the head and cervical spine was performed following the standard protocol without intravenous contrast. Multiplanar CT image reconstructions of the cervical spine were also generated.  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 No evidence of a recent cortical infarct.  Ventricles are enlarged, to a greater degree than the sulci, but stable from the prior CT. No convincing hydrocephalus.  No extra-axial masses or abnormal fluid collections.  There is no intracranial hemorrhage.  Mild sinus mucosal thickening mostly along the ethmoid air cells. Clear mastoid air cells no skull fracture.  CT CERVICAL SPINE FINDINGS  No fracture. No spondylolisthesis. Moderate loss of disc height noted at C 6- C7 and C7-T1. Mild loss disc height at C5-C6. Facet degenerative change noted bilaterally. There are multiple levels of mild diffuse spondylotic disc bulging and at least mild degrees of neural foraminal narrowing.  Bones are demineralized.  Soft tissues show carotid vascular calcifications, but no masses or enlarged lymph nodes. Lung apices are clear.  IMPRESSION: HEAD CT: No  acute intracranial abnormalities. Stable appearance from the prior exam.  CERVICAL CT:  No fracture or acute finding.   Electronically Signed   By: Lajean Manes M.D.   On: 04/07/2015 17:10   Ct Cervical Spine Wo Contrast  04/07/2015   CLINICAL DATA:  Per ems, pt with unwitnessed  fall at 3am, trxfr to ED with known right hip fracture. Baseline is dementia, oriented only to self.  EXAM: CT HEAD WITHOUT CONTRAST  CT CERVICAL SPINE WITHOUT CONTRAST  TECHNIQUE: Multidetector CT imaging of the head and cervical spine was performed following the standard protocol without intravenous contrast. Multiplanar CT image reconstructions of the cervical spine were also generated.  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 No evidence of a recent cortical infarct.  Ventricles are enlarged, to a greater degree than the sulci, but stable from the prior CT. No convincing hydrocephalus.  No extra-axial masses or abnormal fluid collections.  There is no intracranial hemorrhage.  Mild sinus mucosal thickening mostly along the ethmoid air cells. Clear mastoid air cells no skull fracture.  CT CERVICAL SPINE FINDINGS  No fracture. No spondylolisthesis. Moderate loss of disc height noted at C 6- C7 and C7-T1. Mild loss disc height at C5-C6. Facet degenerative change noted bilaterally. There are multiple levels of mild diffuse spondylotic disc bulging and at least mild degrees of neural foraminal narrowing.  Bones are demineralized.  Soft tissues show carotid vascular calcifications, but no masses or enlarged lymph nodes. Lung apices are clear.  IMPRESSION: HEAD CT: No acute intracranial abnormalities. Stable appearance from the prior exam.  CERVICAL CT:  No fracture or acute finding.   Electronically Signed   By: Lajean Manes M.D.   On: 04/07/2015  17:10   Dg Hip Operative Unilat With Pelvis Right  04/08/2015   CLINICAL DATA:  81 year old male status post ORIF of right intertrochanteric fracture  EXAM: OPERATIVE RIGHT HIP (WITH PELVIS IF PERFORMED) 4 VIEWS  TECHNIQUE: Fluoroscopic spot image(s) were submitted for interpretation post-operatively.  FLUOROSCOPY TIME:  30 seconds reported  COMPARISON:  Preoperative radiographs 04/07/2015  FINDINGS: Interval ORIF of right intertrochanteric femoral fracture with intra medullary nail and cannulated transfemoral neck lag screw. Improved alignment of the fracture fragments compared to the preoperative images. No evidence of immediate hardware complication. Single distal interlocking screw. Scattered atherosclerotic vascular calcifications.  IMPRESSION: ORIF right intertrochanteric femoral fracture without evidence of hardware complication.   Electronically Signed   By: Jacqulynn Cadet M.D.   On: 04/08/2015 01:17   Dg Hips Bilat With Pelvis 3-4 Views  04/07/2015   CLINICAL DATA:  Unwitnessed fall, now transferred to the emergency department with known right hip fracture. Initial encounter.  EXAM: BILATERAL HIP (WITH PELVIS) 3-4 VIEWS  COMPARISON:  None.  FINDINGS: There is a comminuted minimally displaced right-sided intertrochanteric femur fracture with minimal foreshortening and varus angulation. No definite intra-articular extension. A prominent (approximately 1.2 cm) presumed bone island is noted within the proximal femoral diaphysis.  No left-sided hip fracture. Mild-to-moderate degenerative change of the left hip with joint space loss, subchondral sclerosis and osteophytosis. No evidence of avascular necrosis. Note is made of a punctate (approximately 1 cm) presumed bone island within the left acetabulum as well as a smaller (approximately 5 mm) suspected bone island within the left femoral head.  No definite displaced pelvic fracture.  Vascular calcifications are noted within the pelvis and upper aspects  of the thigh bilaterally. No radiopaque foreign body.  IMPRESSION: 1. Acute minimally displaced right-sided intertrochanteric femur fracture without definite intra-articular extension. 2. No left-sided femur fracture.   Electronically Signed   By: Sandi Mariscal M.D.   On: 04/07/2015 17:29  Microbiology: Recent Results (from the past 240 hour(s))  Surgical pcr screen     Status: None   Collection Time: 04/07/15 11:39 PM  Result Value Ref Range Status   MRSA, PCR NEGATIVE NEGATIVE Final   Staphylococcus aureus NEGATIVE NEGATIVE Final    Comment:        The Xpert SA Assay (FDA approved for NASAL specimens in patients over 44 years of age), is one component of a comprehensive surveillance program.  Test performance has been validated by [facility] for patients greater than or equal to 73 year old. It is not intended to diagnose infection nor to guide or monitor treatment.      Labs: Basic Metabolic Panel:  Recent Labs Lab 04/07/15 1832 04/07/15 1853 04/08/15 0100 04/08/15 0720 04/09/15 0620  NA 138 138  --  142 133*  K 4.1 4.0  --  3.9 4.5  CL 106 104  --  108 101  CO2 23  --   --  27 24  GLUCOSE 344* 347*  --  134* 253*  BUN 20 21  --  18 26*  CREATININE 1.15 1.10 1.22 1.11 1.37*  CALCIUM 8.9  --   --  8.7 8.3*   Liver Function Tests: No results for input(s): AST, ALT, ALKPHOS, BILITOT, PROT, ALBUMIN in the last 168 hours. No results for input(s): LIPASE, AMYLASE in the last 168 hours. No results for input(s): AMMONIA in the last 168 hours. CBC:  Recent Labs Lab 04/07/15 1832 04/07/15 1853 04/08/15 0100 04/08/15 0720 04/09/15 0620 04/10/15 0524  WBC 11.6*  --  13.4* 13.1* 11.4* 10.9*  NEUTROABS 9.6*  --   --   --   --   --   HGB 12.7* 12.9* 11.8* 11.4* 10.3* 10.3*  HCT 38.0* 38.0* 37.0* 35.6* 32.1* 31.9*  MCV 85.6  --  86.9 87.9 89.7 89.9  PLT 113*  --  114* 120* 105* 118*   Cardiac Enzymes: No results for input(s): CKTOTAL, CKMB, CKMBINDEX, TROPONINI  in the last 168 hours. BNP: BNP (last 3 results) No results for input(s): BNP in the last 8760 hours.  ProBNP (last 3 results)  Recent Labs  10/24/14 0845  PROBNP 2991.0*    CBG:  Recent Labs Lab 04/09/15 0626 04/09/15 1117 04/09/15 1627 04/09/15 2125 04/10/15 0628  GLUCAP 218* 253* 149* 149* 123*       Signed:  Velvet Bathe  [facility] 04/10/2015, 9:56 AM

## 2015-04-10 NOTE — Progress Notes (Signed)
Orthopedics Progress Note  Subjective: Patient reports no pain in the hip  Objective:  Filed Vitals:   04/10/15 0400  BP:   Pulse:   Temp:   Resp: 17    General: Awake and alert  Musculoskeletal: right hip incisions with no erythema and no drainage. Moderate swelling in the leg no cords Neurovascularly intact  Lab Results  Component Value Date   WBC 10.9* 04/10/2015   HGB 10.3* 04/10/2015   HCT 31.9* 04/10/2015   MCV 89.9 04/10/2015   PLT 118* 04/10/2015       Component Value Date/Time   NA 133* 04/09/2015 0620   NA 140 03/10/2015   K 4.5 04/09/2015 0620   CL 101 04/09/2015 0620   CO2 24 04/09/2015 0620   GLUCOSE 253* 04/09/2015 0620   BUN 26* 04/09/2015 0620   BUN 16 03/10/2015   CREATININE 1.37* 04/09/2015 0620   CREATININE 1.0 03/10/2015   CREATININE 1.10 08/27/2014 1018   CALCIUM 8.3* 04/09/2015 0620   GFRNONAA 47* 04/09/2015 0620   GFRNONAA 63 08/27/2014 1018   GFRAA 54* 04/09/2015 0620   GFRAA 73 08/27/2014 1018    Lab Results  Component Value Date   INR 1.25 04/07/2015   INR 1.13 03/24/2014   INR 1.13 03/18/2014    Assessment/Plan: POD #3 s/p Procedure(s): INTRAMEDULLARY (IM) NAIL INTERTROCHANTRIC D/C to SNF pending. High fall risk. Will need Lovenox for at least one month post op F/u with me in two weeks If you need help, please call on call ortho as I am out of town as of today [place]. Veverly Fells, MD 04/10/2015 7:34 AM

## 2015-04-10 NOTE — Progress Notes (Signed)
Patient ID: Troy Warren, male   DOB: 11/19/1931, 81 y.o.   MRN: [id number]   Place of Service: [facility]  Allergies  Allergen Reactions  . Lipitor [Atorvastatin]     weakness    Code Status: Full Code  Goals of Care: Longevity/LTC  Chief Complaint  Patient presents with  . Hospitalization Follow-up    HPI 81 y.o. male long term resident with PMH of afib, HTN, hypothyroidism, depression w/ anxiety, mixed AD and vascular dementia, CHF, DM2 among others is being seen for a post hospital follow-up post hospital admission from 04/07/15 to 04/10/15 for right displaced intertrochanteric hip fracture s/p mechanical fall at SNF. He underwent right hip IM nail by Dr. Veverly Fells on 01/14/53 with no complications. Seen in room today. Wife at bedside. Reported some pain in Right hip but unable to describe pain in further details. Denies any other concerns  Review of Systems Constitutional: Negative for fever and chills HENT: Negative for ear pain and sore throat Eyes: Negative for eye pain, eye discharge, redness, or itchiness Cardiovascular: Negative for chest pain, palpitations, and leg swelling Respiratory: Negative cough, shortness of breath, and wheezing.  Gastrointestinal: Negative for nausea and vomiting. Negative for abdominal pain Genitourinary: Negative for dysuria Musculoskeletal: See HPI Neurological: Negative for dizziness and headache Skin: Negative for rash and wound.   Psychiatric: Negative for depression  Past Medical History  Diagnosis Date  . Hypertension   . Ischemic heart disease   . Hyperlipemia   . Depression   . Testicular cancer   . Polio     child polio  . Rheumatic fever   . Mild dementia   . Mild dementia   . Systolic heart failure     acute on chronic  . CAD (coronary artery disease)   . Chronic atrial fibrillation   . Vitamin D deficiency   . Type II or unspecified type diabetes mellitus without mention of complication, not stated as  uncontrolled   . Other testicular hypofunction   . Permanent atrial fibrillation 01/21/2014  . ICD, biventricular, in situ - Medtronic Protecta 2013, leads 2009 01/21/2014    New Generator Medtronic [place] model number [id number], serial number [id number]  Right atrial lead (chronic) Medtronic, model number [id number], serial [id number] (implanted 05/01/2008)  Right ventricular lead (chronic) Medtronic, model number [id number], serial number [id number] (implanted 05/01/2008)  Left ventricular lead Medtronic, model number [id number], serial number [id number] (implanted 05/01/2008)  Exp  . Pleural effusion, right 03/18/2014  . Emphysema 03/18/2014  . Pulmonary infiltrate 05/10/2014    CT chest 03/2014:  RUL interstitial/GGO     Past Surgical History  Procedure Laterality Date  . Coronary artery bypass graft  06/26/2002    LIMA to diagonal artery & SVG to the first obtuse margin  . Nm myocar perf wall motion  09/15/2006    Dilated LV w/small area of possible nontransmural inferior scar w/mild perinfarct ischemia.  No significant change from previous study.  . Cardiac defibrillator placement  05/01/2008    Medtronic  . Biv icd genertaor change out N/A 10/27/2012    Procedure: BIV ICD GENERTAOR CHANGE OUT;  Surgeon: Sanda Klein, MD;  Location: [facility] CATH LAB;  Service: Cardiovascular;  Laterality: N/A;  . Intramedullary (im) nail intertrochanteric Right 04/07/2015    Procedure: INTRAMEDULLARY (IM) NAIL INTERTROCHANTRIC;  Surgeon: Netta Cedars, MD;  Location: [facility];  Service: Orthopedics;  Laterality: Right;    History   Social History  . Marital Status: Married  Spouse Name: Judeth Porch  . Number of Children: 0  . Years of Education: 8th   Occupational History  . retired    Social History Main Topics  . Smoking status: Former Smoker    Quit date: 12/24/1968  . Smokeless tobacco: Never Used  . Alcohol Use: No  . Drug Use: No  . Sexual Activity: No   Other Topics Concern  . Not on file   Social History  Narrative   Patient lives at home with spouse.   Patient has 8th grade education   Patient is right handed.   Caffeine Use: Rarely    Family History  Problem Relation Age of Onset  . Other Mother   . Heart attack Father       Medication List       This list is accurate as of: 04/10/15 11:59 PM.  Always use your most recent med list.               acetaminophen 325 MG tablet  Commonly known as:  TYLENOL  Take 1-2 tablets (325-650 mg total) by mouth every 6 (six) hours as needed for moderate pain.     ALPRAZolam 0.25 MG tablet  Commonly known as:  XANAX  Take 0.125 mg by mouth at bedtime.     bisacodyl 10 MG suppository  Commonly known as:  DULCOLAX  Place 1 suppository (10 mg total) rectally daily as needed for moderate constipation.     citalopram 40 MG tablet  Commonly known as:  CELEXA  Take 0.5 tablets (20 mg total) by mouth daily.     diltiazem 120 MG tablet  Commonly known as:  CARDIZEM  Take 1 tablet (120 mg total) by mouth 3 (three) times daily.     donepezil 23 MG Tabs tablet  Commonly known as:  ARICEPT  Take 1 tablet (23 mg total) by mouth at bedtime.     dorzolamide-timolol 22.3-6.8 MG/ML ophthalmic solution  Commonly known as:  COSOPT  Place 1 drop into both eyes 2 (two) times daily.     enoxaparin 40 MG/0.4ML injection  Commonly known as:  LOVENOX  Inject 0.4 mLs (40 mg total) into the skin daily. 30 days post op     ferrous fumarate 325 (106 FE) MG Tabs tablet  Commonly known as:  HEMOCYTE - 106 mg FE  Take 1 tablet by mouth 2 (two) times daily.     furosemide 40 MG tablet  Commonly known as:  LASIX  Take 1 tablet (40 mg total) by mouth daily.     HYDROcodone-acetaminophen 5-325 MG per tablet  Commonly known as:  NORCO/VICODIN  Take 1 tablet by mouth every 4 (four) hours as needed for moderate pain or severe pain.     insulin detemir 100 UNIT/ML injection  Commonly known as:  LEVEMIR  Inject 10 Units into the skin at bedtime.      insulin lispro 100 UNIT/ML injection  Commonly known as:  HUMALOG  - Inject 5-10 Units into the skin 3 (three) times daily before meals. 5 units for cbg 250-350  - 8 units for cbg >350-450  - 10 units for cbg>450     isosorbide mononitrate 60 MG 24 hr tablet  Commonly known as:  IMDUR  Take 90 mg by mouth daily. Pt takes 1and 1/2 of the 60 mg pill to equal 90 mg.     KLOR-CON M10 10 MEQ tablet  Generic drug:  potassium chloride  TAKE 1 TABLET TWICE DAILY  labetalol 200 MG tablet  Commonly known as:  NORMODYNE  TAKE 1 TABLET TWICE A DAY     levothyroxine 75 MCG tablet  Commonly known as:  SYNTHROID, LEVOTHROID  TAKE 1 TABLET EVERY DAY     linagliptin 5 MG Tabs tablet  Commonly known as:  TRADJENTA  Take 5 mg by mouth daily.     lisinopril 10 MG tablet  Commonly known as:  PRINIVIL,ZESTRIL  Take 10 mg by mouth daily.     loratadine 10 MG tablet  Commonly known as:  CLARITIN  Take 10 mg by mouth daily.     Olopatadine HCl 0.2 % Soln  Place 1 drop into both eyes 2 (two) times daily.     polyethylene glycol packet  Commonly known as:  MIRALAX / GLYCOLAX  Take 17 g by mouth daily.     pravastatin 40 MG tablet  Commonly known as:  PRAVACHOL  TAKE 1 TABLET EVERY DAY FOR CHOLESTEROL     VITAMIN B-12 PO  Take 1,000 mcg by mouth daily.     Vitamin D3 2000 UNITS Tabs  Take 2 tablets by mouth daily.       Physical Exam  BP 154/78 mmHg  Pulse 50  Temp(Src) 97 F (36.1 C)  Resp 18  SpO2 98%  Constitutional: frail elderly male in no acute distress. Conversant and pleasant with confusions HEENT: Normocephalic and atraumatic. PERRL. EOM intact. No scleral icterus. No nasal discharge or sinus tenderness. Oral mucosa moist. Posterior pharynx clear of any exudate or lesions.  Neck: Supple and nontender. No lymphadenopathy, masses, or thyromegaly. No JVD or carotid bruits. Cardiac: Normal S1, S2. Irregularly irregular without appreciable murmurs, rubs, or gallops.  Distal pulses intact. Trace pitting edema of BLE Lungs: No respiratory distress. Breath sounds clear bilaterally without rales, rhonchi, or wheezes. Abdomen: Audible bowel sounds in all quadrants. Soft, nontender, nondistended.  Musculoskeletal: RLE tender to palpation around hip/thigh area.  Skin: Warm and dry.  Neurological: Alert and oriented to self Psychiatric: Appropriate mood and affect.   Labs Reviewed  CBC Latest Ref Rng 04/10/2015 04/09/2015 04/08/2015  WBC 4.0 - 10.5 K/uL 10.9(H) 11.4(H) 13.1(H)  Hemoglobin 13.0 - 17.0 g/dL 10.3(L) 10.3(L) 11.4(L)  Hematocrit 39.0 - 52.0 % 31.9(L) 32.1(L) 35.6(L)  Platelets 150 - 400 K/uL 118(L) 105(L) 120(L)    CMP Latest Ref Rng 04/09/2015 04/08/2015 04/08/2015  Glucose 70 - 99 mg/dL 253(H) 134(H) -  BUN 6 - 23 mg/dL 26(H) 18 -  Creatinine 0.50 - 1.35 mg/dL 1.37(H) 1.11 1.22  Sodium 135 - 145 mmol/L 133(L) 142 -  Potassium 3.5 - 5.1 mmol/L 4.5 3.9 -  Chloride 96 - 112 mmol/L 101 108 -  CO2 19 - 32 mmol/L 24 27 -  Calcium 8.4 - 10.5 mg/dL 8.3(L) 8.7 -  Total Protein 6.0 - 8.3 g/dL - - -  Total Bilirubin 0.3 - 1.2 mg/dL - - -  Alkaline Phos 39 - 117 U/L - - -  AST 0 - 37 U/L - - -  ALT 0 - 53 U/L - - -    Lab Results  Component Value Date   HGBA1C 8.0* 04/08/2015    Lab Results  Component Value Date   TSH 0.985 04/08/2015    Lipid Panel     Component Value Date/Time   CHOL 116 11/04/2014   TRIG 66 11/04/2014   HDL 49 11/04/2014   CHOLHDL 2.5 08/27/2014 1018   VLDL 17 08/27/2014 1018   LDLCALC 54 11/04/2014  12/17//15: Urine mic/cr: 1020.4  Diagnostic Studies Reviewed 04/07/15: CXR No evidence of acute cardiopulmonary disease. No frank interstitial edema  04/07/15: Right Hip Xray: ORIF right intertrochanteric femoral fracture without evidence of hardware complication.  Assessment & Plan 1. DM (diabetes mellitus) type II controlled with renal manifestation Last a1c 8.1. Continue Levemir 10 units daily and tranjenta 5mg   daily with Humalog TID before meals to: 5 units for cbg 250-350, 8 units for cbg >350-450, and 10 units for cbg>450. Monitor cbg  2. Hypothyroidism, unspecified hypothyroidism type Stable. Continue levothyroxine 32mcg daily and monitor.   3. Mixed Alzheimer's and vascular dementia Continue aricept 23mg  daily at bedtime. Continue assist with ADL and monitor for change in behaviors. Continue fall and pressure ulcer prophylaxis  4. Chronic systolic congestive heart failure Clinically compensated. EF 25-30% on Echo 03/2014. Will hold lasix until 04/13/15 for elevated creatinine and BUN. Continue  lisinopril 10mg  daily, labetalol 200mg  twice daily, and imdur 90mg  daily. Continue daily weight and monitor his status.   5. Permanent atrial fibrillation S/p ICD placement. Rate-controlled. Continue asa 81mg  daily. Continue cardizem 120mg  three times daily and labetalol 200mg  twice daily. Continue to monitor his status  6. Depression with anxiety Mood stable. Continue celexa 20mg  daily with xanax 0.125mg  daily at bedtime. Continue to monitor for change in mood  7. Essential hypertension BP adequately controlled. Continue linopril 10mg  daily, labetatol 200mg  twice daily, cardizem 120mg  three times daily, and lasix 40mg  daily (resume on 04/13/15) with potassium supplement 80meq twice daily.   8. Anemia, acute blood loss Most likely post op. Last hgb 10.3. Continue iron supplment twice daily. Monitor h&h  9. Glaucoma Continue cosopt gtt in each eye twice daily  10. Constipation Stable. Continue miralax 17g daily and monitor.   11. HLD Last LDL 54. Continue pravachol 40mg  daily and monitor.   12. Right intertrochanteric hip fracture, sequela S/p IM nail by Dr. Veverly Fells on 04/07/15. Continue tylenol 325-650mg  every six hours as needed for moderate pain. Will add norco 5/325mg  every four hours as needed for severe pain. Continue Lovenox 40mg  daily x 30days post op for DVT prophylaxis. Continue to work with  PT/OT for gait/strength/balance training to restore/maximize function and f/u with ortho.  Family/Staff Communication Plan of care discussed with resident, wife, and nursing staff. Resident, wife, and nursing staff verbalized understanding and agree with plan of care. No additional questions or concerns reported.    Arthur Holms, MSN, AGNP-C [facility] [address] [place], [place] [address] [phone] [8am-5pm] After hours: [phone]

## 2015-04-10 NOTE — Clinical Social Work Note (Addendum)
Patient will discharge today per MD order. Patient will discharge to [facility] RN to call report prior to transportation to: [phone] Transportation: [employer] insurance auth to arrange for transportation)  CSW sent discharge summary to SNF for review.  Packet is complete.  RN, patient and family aware of discharge.  CSW spoke with patient daughter Jackelyn Poling who was agreeable to dc plans.  CSW left patient's wife a message.  Nonnie Done, [place] [phone]  Psychiatric & Orthopedics (5N 1-16) Clinical Social Worker

## 2015-04-10 NOTE — Progress Notes (Signed)
Inpatient Diabetes Program Recommendations  AACE/ADA: New Consensus Statement on Inpatient Glycemic Control (2013)  Target Ranges:  Prepandial:   less than 140 mg/dL      Peak postprandial:   less than 180 mg/dL (1-2 hours)      Critically ill patients:  140 - 180 mg/dL   Results for Troy Warren, Troy Warren (MRN [id number]) as of 04/10/2015 08:56  Ref. Range 04/09/2015 06:26 04/09/2015 11:17 04/09/2015 16:27 04/09/2015 21:25 04/10/2015 06:28  Glucose-Capillary Latest Ref Range: 70-99 mg/dL 218 (H) 253 (H) 149 (H) 149 (H) 123 (H)   Diabetes history: DM2 Outpatient Diabetes medications: Levemir 10 units QHS, Humalog 5-10 units TID with meals, Tradjenta 5 mg daily Current orders for Inpatient glycemic control: Levemir 10 units QHS, Novolog 0-15 units TID with meals, Tradjenta 5 mg daily  Inpatient Diabetes Program Recommendations Correction (SSI): Please consider ordering Bedtime Novolog correction scale. Insulin - Meal Coverage: Please consider ordering Novolog 3 units TID with meals if patient eats at least 50% of meals.  Thanks, Barnie Alderman, RN, MSN, CCRN, CDE Diabetes Coordinator Inpatient Diabetes Program [phone] (Team Pager from [place] to [place]) [phone] ([facility] office) [phone] [facility] office)

## 2015-04-10 NOTE — Progress Notes (Signed)
Physical Therapy Treatment Patient Details Name: Troy Warren MRN: [id number] DOB: [birth year] Today's Date: 04/10/2015    History of Present Illness Pt is a 81 y/o M s/p fall and R hip fx.  Now s/p R IM nail.  Pt's PMH includes HTN, depression, dementia, CAD, chronic a fib, R pleural effusion, and emphysema.    PT Comments    Pt performed sit>stand this session at EOB w/ +2 assist and A/AA/PROM exercises in bed.  Pt will benefit from continued skilled PT services to increase functional independence and safety.  Follow Up Recommendations  SNF;Supervision/Assistance - 24 hour     Equipment Recommendations   (TBD by next venue of care)    Recommendations for Other Services       Precautions / Restrictions Precautions Precautions: Fall Restrictions Weight Bearing Restrictions: Yes RLE Weight Bearing: Weight bearing as tolerated    Mobility  Bed Mobility Overal bed mobility: Needs Assistance;+2 for physical assistance Bed Mobility: Supine to Sit;Sit to Supine     Supine to sit: Max assist;+2 for physical assistance;HOB elevated Sit to supine: Max assist;+2 for physical assistance   General bed mobility comments: Max A w/ HOB elevated, verbal and tactile cues, to achieve sitting EOB. Pt w/ posterior lean sitting EOB but w/ hand placement for support pt is able to maintain balance for 1 min w/o support.  Transfers Overall transfer level: Needs assistance Equipment used: Rolling walker (2 wheeled) Transfers: Sit to/from Stand Sit to Stand: Mod assist;+2 physical assistance         General transfer comment: Use of bed pad to assist pt to standing upright.  Once standing pt requests to sit down and required mod A x2 to perform stand>sit.  Ambulation/Gait                 Stairs            Wheelchair Mobility    Modified Rankin (Stroke Patients Only)       Balance Overall balance assessment: Needs assistance Sitting-balance support: Bilateral  upper extremity supported;Feet supported Sitting balance-Leahy Scale: Poor   Postural control: Posterior lean Standing balance support: Bilateral upper extremity supported Standing balance-Leahy Scale: Poor                      Cognition Arousal/Alertness: Awake/alert Behavior During Therapy: Agitated Overall Cognitive Status: History of cognitive impairments - at baseline Area of Impairment: Orientation;Following commands;Safety/judgement Orientation Level: Disoriented to;Person;Place;Time;Situation   Memory: Decreased short-term memory Following Commands: Follows one step commands inconsistently Safety/Judgement: Decreased awareness of safety;Decreased awareness of deficits          Exercises Total Joint Exercises Ankle Circles/Pumps: PROM;Both;10 reps;Supine Heel Slides: AAROM;Right;10 reps;Supine Hip ABduction/ADduction: AAROM;Right;10 reps;Supine Straight Leg Raises: AROM;AAROM;Both;5 reps;Supine    General Comments        Pertinent Vitals/Pain Pain Assessment: Faces Faces Pain Scale: Hurts even more Pain Location: pt unable to specify; likely R hip Pain Descriptors / Indicators: Grimacing;Moaning;Guarding Pain Intervention(s): Limited activity within patient's tolerance;Monitored during session;Repositioned    Home Living                      Prior Function            PT Goals (current goals can now be found in the care plan section) Acute Rehab PT Goals Patient Stated Goal: none stated Progress towards PT goals: Progressing toward goals    Frequency  Min 3X/week    PT Plan  Current plan remains appropriate    Co-evaluation             End of Session Equipment Utilized During Treatment: Oxygen Activity Tolerance: Treatment limited secondary to agitation;Patient limited by pain Patient left: in bed;with call bell/phone within reach;with bed alarm set     Time: 1219-7588 PT Time Calculation (min) (ACUTE ONLY): 15  min  Charges:  $Therapeutic Exercise: 8-22 mins                    G Codes:      Joslyn Hy PT, [place] [phone] Pager: [phone] 04/10/2015, 12:08 PM

## 2015-04-14 ENCOUNTER — Non-Acute Institutional Stay (SKILLED_NURSING_FACILITY): Payer: Medicare Other | Admitting: Internal Medicine

## 2015-04-14 DIAGNOSIS — F0280 Dementia in other diseases classified elsewhere without behavioral disturbance: Secondary | ICD-10-CM

## 2015-04-14 DIAGNOSIS — G309 Alzheimer's disease, unspecified: Secondary | ICD-10-CM | POA: Diagnosis not present

## 2015-04-14 DIAGNOSIS — K5901 Slow transit constipation: Secondary | ICD-10-CM

## 2015-04-14 DIAGNOSIS — S72141S Displaced intertrochanteric fracture of right femur, sequela: Secondary | ICD-10-CM | POA: Diagnosis not present

## 2015-04-14 DIAGNOSIS — D62 Acute posthemorrhagic anemia: Secondary | ICD-10-CM

## 2015-04-14 DIAGNOSIS — F05 Delirium due to known physiological condition: Secondary | ICD-10-CM | POA: Diagnosis not present

## 2015-04-14 DIAGNOSIS — R296 Repeated falls: Secondary | ICD-10-CM | POA: Diagnosis not present

## 2015-04-14 DIAGNOSIS — N058 Unspecified nephritic syndrome with other morphologic changes: Secondary | ICD-10-CM

## 2015-04-14 DIAGNOSIS — R5381 Other malaise: Secondary | ICD-10-CM | POA: Diagnosis not present

## 2015-04-14 DIAGNOSIS — E1129 Type 2 diabetes mellitus with other diabetic kidney complication: Secondary | ICD-10-CM | POA: Diagnosis not present

## 2015-04-14 DIAGNOSIS — I5022 Chronic systolic (congestive) heart failure: Secondary | ICD-10-CM | POA: Diagnosis not present

## 2015-04-14 DIAGNOSIS — F0150 Vascular dementia without behavioral disturbance: Secondary | ICD-10-CM

## 2015-04-14 DIAGNOSIS — I482 Chronic atrial fibrillation: Secondary | ICD-10-CM | POA: Diagnosis not present

## 2015-04-14 DIAGNOSIS — F418 Other specified anxiety disorders: Secondary | ICD-10-CM | POA: Diagnosis not present

## 2015-04-14 DIAGNOSIS — E039 Hypothyroidism, unspecified: Secondary | ICD-10-CM | POA: Diagnosis not present

## 2015-04-14 DIAGNOSIS — I4821 Permanent atrial fibrillation: Secondary | ICD-10-CM

## 2015-04-14 DIAGNOSIS — R410 Disorientation, unspecified: Secondary | ICD-10-CM

## 2015-04-14 NOTE — Progress Notes (Signed)
Patient ID: Troy Warren, male   DOB: 1931-02-17, 81 y.o.   MRN: [id number]     Facility: [facility]    PCP: Alesia Richards, MD  Code Status: full code  Allergies  Allergen Reactions  . Lipitor [Atorvastatin]     weakness    Chief Complaint  Patient presents with  . New Admit To SNF     HPI:  81 year old patient is here for short term rehabilitation post hospital admission from 04/07/15-04/10/15 with closed intertrochanteric fracture of right femur after a fall. He underwent intermedullary hip nailing. He is seen in his room today. He is alert to self. He has been confused as per staff. He has been yelling out. He had a fall over the weekend. No evident injury reported. He also has been having black colored stool. No frank blood noted.  He has PMH of afib, HLD, biventricular ICD, hypothyroidism, CKD stage 3, HTN, dementia, DM and CHF   Review of Systems: unable to obtain ROS from patient as he is confused.  As per staff, no fever or chills Ate his breakfast this am  Past Medical History  Diagnosis Date  . Hypertension   . Ischemic heart disease   . Hyperlipemia   . Depression   . Testicular cancer   . Polio     child polio  . Rheumatic fever   . Mild dementia   . Mild dementia   . Systolic heart failure     acute on chronic  . CAD (coronary artery disease)   . Chronic atrial fibrillation   . Vitamin D deficiency   . Type II or unspecified type diabetes mellitus without mention of complication, not stated as uncontrolled   . Other testicular hypofunction   . Permanent atrial fibrillation 01/21/2014  . ICD, biventricular, in situ - Medtronic Protecta 2013, leads 2009 01/21/2014    New Generator Medtronic [place] model number [id number], serial number [id number]  Right atrial lead (chronic) Medtronic, model number [id number], serial [id number] (implanted 05/01/2008)  Right ventricular lead (chronic) Medtronic, model number [id number], serial  number [id number] (implanted 05/01/2008)  Left ventricular lead Medtronic, model number [id number], serial number [id number] (implanted 05/01/2008)  Exp  . Pleural effusion, right 03/18/2014  . Emphysema 03/18/2014  . Pulmonary infiltrate 05/10/2014    CT chest 03/2014:  RUL interstitial/GGO    Past Surgical History  Procedure Laterality Date  . Coronary artery bypass graft  06/26/2002    LIMA to diagonal artery & SVG to the first obtuse margin  . Nm myocar perf wall motion  09/15/2006    Dilated LV w/small area of possible nontransmural inferior scar w/mild perinfarct ischemia.  No significant change from previous study.  . Cardiac defibrillator placement  05/01/2008    Medtronic  . Biv icd genertaor change out N/A 10/27/2012    Procedure: BIV ICD GENERTAOR CHANGE OUT;  Surgeon: Sanda Klein, MD;  Location: [facility] CATH LAB;  Service: Cardiovascular;  Laterality: N/A;  . Intramedullary (im) nail intertrochanteric Right 04/07/2015    Procedure: INTRAMEDULLARY (IM) NAIL INTERTROCHANTRIC;  Surgeon: Netta Cedars, MD;  Location: [place];  Service: Orthopedics;  Laterality: Right;   Social History:   reports that he quit smoking about 46 years ago. He has never used smokeless tobacco. He reports that he does not drink alcohol or use illicit drugs.  Family History  Problem Relation Age of Onset  . Other Mother   . Heart attack Father     Medications:  Patient's Medications  New Prescriptions   No medications on file  Previous Medications   ACETAMINOPHEN (TYLENOL) 325 MG TABLET    Take 1-2 tablets (325-650 mg total) by mouth every 6 (six) hours as needed for moderate pain.   ALPRAZOLAM (XANAX) 0.25 MG TABLET    Take 0.125 mg by mouth at bedtime.   BISACODYL (DULCOLAX) 10 MG SUPPOSITORY    Place 1 suppository (10 mg total) rectally daily as needed for moderate constipation.   CHOLECALCIFEROL (VITAMIN D3) 2000 UNITS TABS    Take 2 tablets by mouth daily.    CITALOPRAM (CELEXA) 40 MG TABLET    Take 0.5 tablets  (20 mg total) by mouth daily.   CYANOCOBALAMIN (VITAMIN B-12 PO)    Take 1,000 mcg by mouth daily.    DILTIAZEM (CARDIZEM) 120 MG TABLET    Take 1 tablet (120 mg total) by mouth 3 (three) times daily.   DONEPEZIL (ARICEPT) 23 MG TABS TABLET    Take 1 tablet (23 mg total) by mouth at bedtime.   DORZOLAMIDE-TIMOLOL (COSOPT) 22.3-6.8 MG/ML OPHTHALMIC SOLUTION    Place 1 drop into both eyes 2 (two) times daily.    ENOXAPARIN (LOVENOX) 40 MG/0.4ML INJECTION    Inject 0.4 mLs (40 mg total) into the skin daily. 30 days post op   FERROUS FUMARATE (HEMOCYTE - 106 MG FE) 325 (106 FE) MG TABS    Take 1 tablet by mouth 2 (two) times daily.   FUROSEMIDE (LASIX) 40 MG TABLET    Take 1 tablet (40 mg total) by mouth daily.   HYDROCODONE-ACETAMINOPHEN (NORCO/VICODIN) 5-325 MG PER TABLET    Take 1 tablet by mouth every 4 (four) hours as needed for moderate pain or severe pain.   INSULIN DETEMIR (LEVEMIR) 100 UNIT/ML INJECTION    Inject 10 Units into the skin at bedtime.    INSULIN LISPRO (HUMALOG) 100 UNIT/ML INJECTION    Inject 5-10 Units into the skin 3 (three) times daily before meals. 5 units for cbg 250-350 8 units for cbg >350-450 10 units for cbg>450   ISOSORBIDE MONONITRATE (IMDUR) 60 MG 24 HR TABLET    Take 90 mg by mouth daily. Pt takes 1and 1/2 of the 60 mg pill to equal 90 mg.   KLOR-CON M10 10 MEQ TABLET    TAKE 1 TABLET TWICE DAILY   LABETALOL (NORMODYNE) 200 MG TABLET    TAKE 1 TABLET TWICE A DAY   LEVOTHYROXINE (SYNTHROID, LEVOTHROID) 75 MCG TABLET    TAKE 1 TABLET EVERY DAY   LINAGLIPTIN (TRADJENTA) 5 MG TABS TABLET    Take 5 mg by mouth daily.   LISINOPRIL (PRINIVIL,ZESTRIL) 10 MG TABLET    Take 10 mg by mouth daily.   LORATADINE (CLARITIN) 10 MG TABLET    Take 10 mg by mouth daily.   OLOPATADINE HCL 0.2 % SOLN    Place 1 drop into both eyes 2 (two) times daily.   POLYETHYLENE GLYCOL (MIRALAX / GLYCOLAX) PACKET    Take 17 g by mouth daily.   PRAVASTATIN (PRAVACHOL) 40 MG TABLET    TAKE 1 TABLET  EVERY DAY FOR CHOLESTEROL  Modified Medications   No medications on file  Discontinued Medications   No medications on file     Physical Exam: Filed Vitals:   04/14/15 1639  BP: 145/78  Pulse: 72  Temp: 98.8 F (37.1 C)  Resp: 19    General- elderly male, in no acute distress Head- normocephalic, atraumatic Nose- no maxillary or frontal  sinus tenderness, no nasal discharge Throat- moist mucus membrane Eyes- no pallor, no icterus, no discharge, normal conjunctiva, normal sclera Neck- no cervical lymphadenopathy Cardiovascular- irregular heart rate, no murmurs, palpable dorsalis pedis, trace right leg edema Respiratory- bilateral clear to auscultation, no wheeze, no rhonchi, no crackles, no use of accessory muscles Abdomen- bowel sounds present, soft, non tender, foley catheter present Musculoskeletal- able to move all 4 extremities, right leg limited range of motion, good ROM in both upper extremity, good range of motion in left lower extremity Neurological- alert and oriented to self only Skin- warm and dry Psychiatry- calm during visit but confused    Labs reviewed: Basic Metabolic Panel:  Recent Labs  05/20/14 1311  04/07/15 1832 04/07/15 1853 04/08/15 0100 04/08/15 0720 04/09/15 0620  NA 141  < > 138 138  --  142 133*  K 4.0  < > 4.1 4.0  --  3.9 4.5  CL 107  < > 106 104  --  108 101  CO2 28  < > 23  --   --  27 24  GLUCOSE 99  < > 344* 347*  --  134* 253*  BUN 15  < > 20 21  --  18 26*  CREATININE 1.11  < > 1.15 1.10 1.22 1.11 1.37*  CALCIUM 9.1  < > 8.9  --   --  8.7 8.3*  MG 2.1  --   --   --   --   --   --   < > = values in this interval not displayed. Liver Function Tests:  Recent Labs  05/20/14 1311 08/27/14 1018 10/24/14 0845  AST 9 12 10   ALT <8 <8 <5  ALKPHOS 79 80 68  BILITOT 0.8 1.2 0.9  PROT 5.8* 6.1 5.9*  ALBUMIN 3.7 3.8 3.1*   No results for input(s): LIPASE, AMYLASE in the last 8760 hours. No results for input(s): AMMONIA in the  last 8760 hours. CBC:  Recent Labs  08/27/14 1018 10/24/14 0845  04/07/15 1832  04/08/15 0720 04/09/15 0620 04/10/15 0524  WBC 8.5 8.2  < > 11.6*  < > 13.1* 11.4* 10.9*  NEUTROABS 7.0 6.3  --  9.6*  --   --   --   --   HGB 13.4 13.6  < > 12.7*  < > 11.4* 10.3* 10.3*  HCT 40.0 42.0  < > 38.0*  < > 35.6* 32.1* 31.9*  MCV 87.7 88.2  --  85.6  < > 87.9 89.7 89.9  PLT 177 140*  < > 113*  < > 120* 105* 118*  < > = values in this interval not displayed. Cardiac Enzymes:  Recent Labs  10/24/14 0845  TROPONINI <0.30   BNP: Invalid input(s): POCBNP CBG:  Recent Labs  04/09/15 2125 04/10/15 0628 04/10/15 1111  GLUCAP 149* 123* 172*    Radiological Exams: 04/07/15: CXR No evidence of acute cardiopulmonary disease. No frank interstitial edema  04/07/15: Right Hip Xray: ORIF right intertrochanteric femoral fracture without evidence of hardware complication.    Assessment/Plan  Physical deconditioning Will have him work with physical therapy and occupational therapy team to help with gait training and muscle strengthening exercises.fall precautions. Skin care. Encourage to be out of bed.   Delirium Recent fracture, pain medication, uncontrolled pain, foley catheter, new environment and dementia could all be contributing to this. D/c foley. Voiding trial.   Right intertrochanteric hip fracture S/p IM nailing. Continue lovenox for dvt prophylaxis. Will have him work with  physical therapy and occupational therapy team to help with gait training and muscle strengthening exercises.fall precautions. Skin care. Encourage to be out of bed. Pain is not under control. Start tylenol 650 mg tid and tramadol 50 mg bid for pain. On prn norco at present. Reassess his pain and increase tramadol if needed. Has follow up with orthopedics  Frequent falls Fall precautions to be taken.  Mixed dementia Continue aricept daily, to provide assistance with feeding. To provide total care for now.  DM  type 2 with renal manifestations Last a1c 8.1. Continue Levemir 10 units daily and tranjenta 5mg  daily with Humalog TID before meals. Monitor cbg. Continue lisinopril and pravachol with baby aspirin  Blood loss anemia Post op, monitor h&h. Continue iron supplement  afib Rate controlled. Has ICD. Continue cardizem 120 mg tid and labetalol 200 mg bid with aspirin 81 mg daily  Constipation Stable, continue miralax  Hypothyroidism Stable. Continue levothyroxine 86mcg daily and monitor.   Chronic systolic congestive heart failure EF 25-30% on Echo 03/2014. Continue  lisinopril 10mg  daily, labetalol 200mg  twice daily, lasix 40 mg daily and imdur 90mg  daily. Continue daily weight. Monitor bmp. Continue kcl supplement  Depression with anxiety Mood stable. Continue celexa 20mg  daily with xanax 0.125mg  daily at bedtime. Continue to monitor for change in mood   Goals of care: short term rehabilitation    Labs/tests ordered: cbc, bmp  Family/ staff Communication: reviewed care plan with patient and nursing supervisor    Blanchie Serve, MD  [place] [phone] (Monday-Friday 8 am - 5 pm) [phone] (afterhours)

## 2015-04-15 ENCOUNTER — Other Ambulatory Visit: Payer: Self-pay | Admitting: *Deleted

## 2015-04-15 IMAGING — CR DG CHEST 2V
3 series · 3 of 3 positions shown · non-contrast
Comparison: CT and plain film of the chest 03/18/2014.

CLINICAL DATA: Shortness of breath.  Question pneumonia.

EXAM:
CHEST  2 VIEW

[w chest lat (1 of 2)]
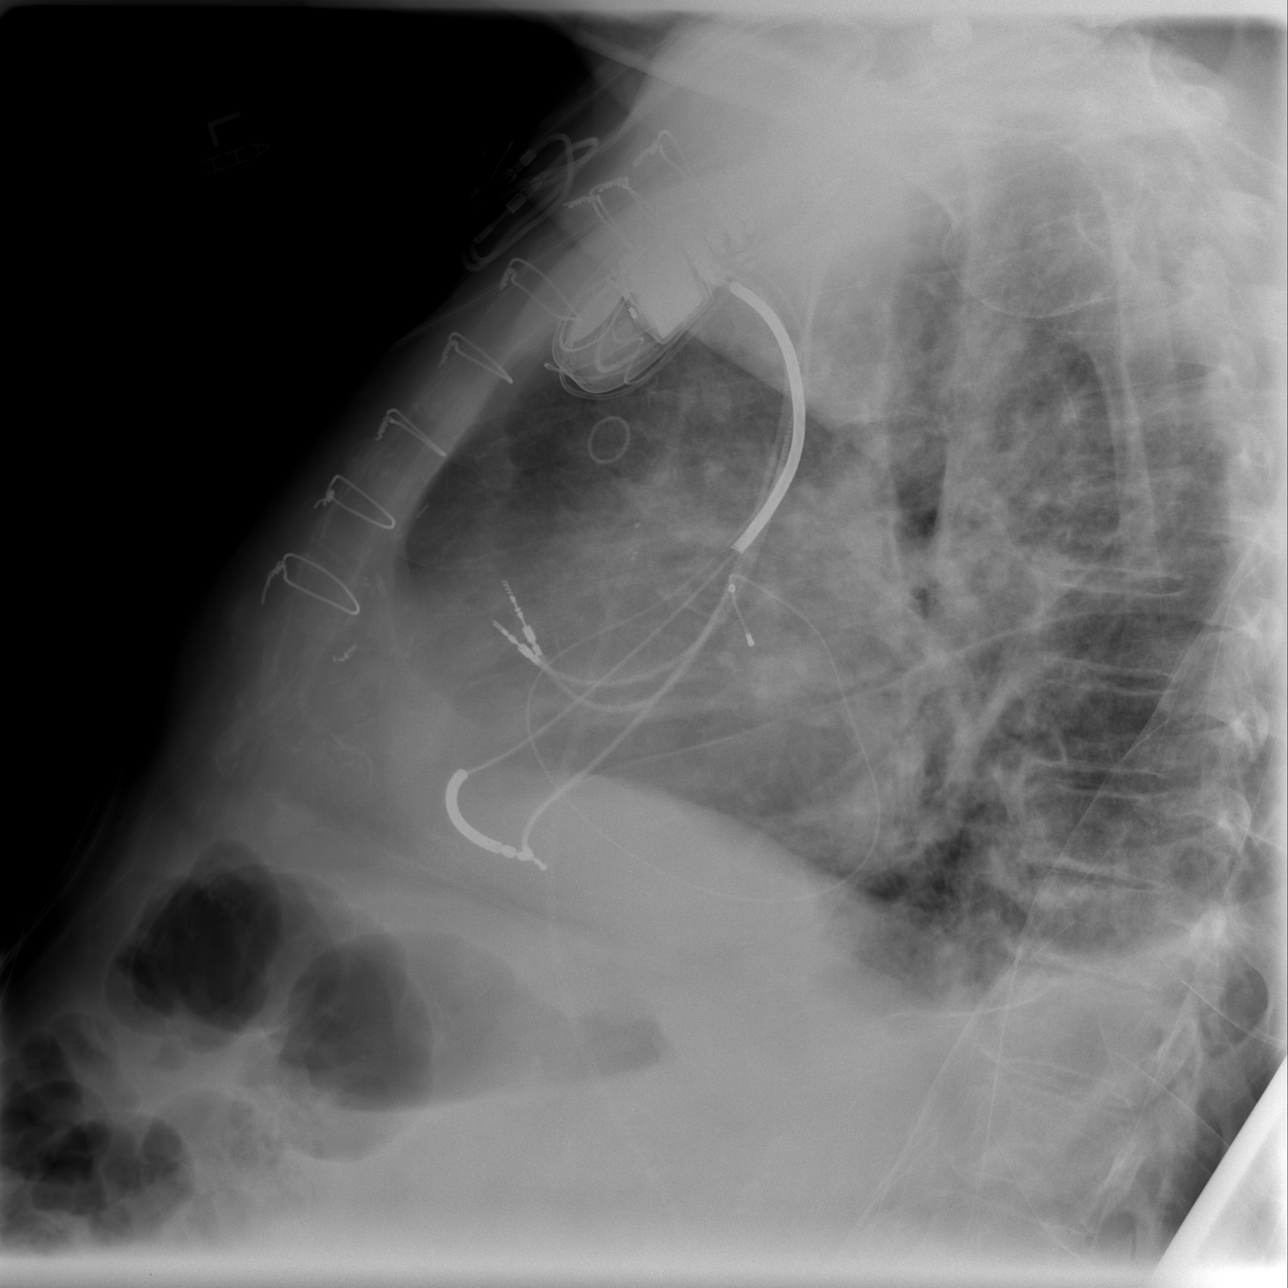

[w chest lat (2 of 2)]
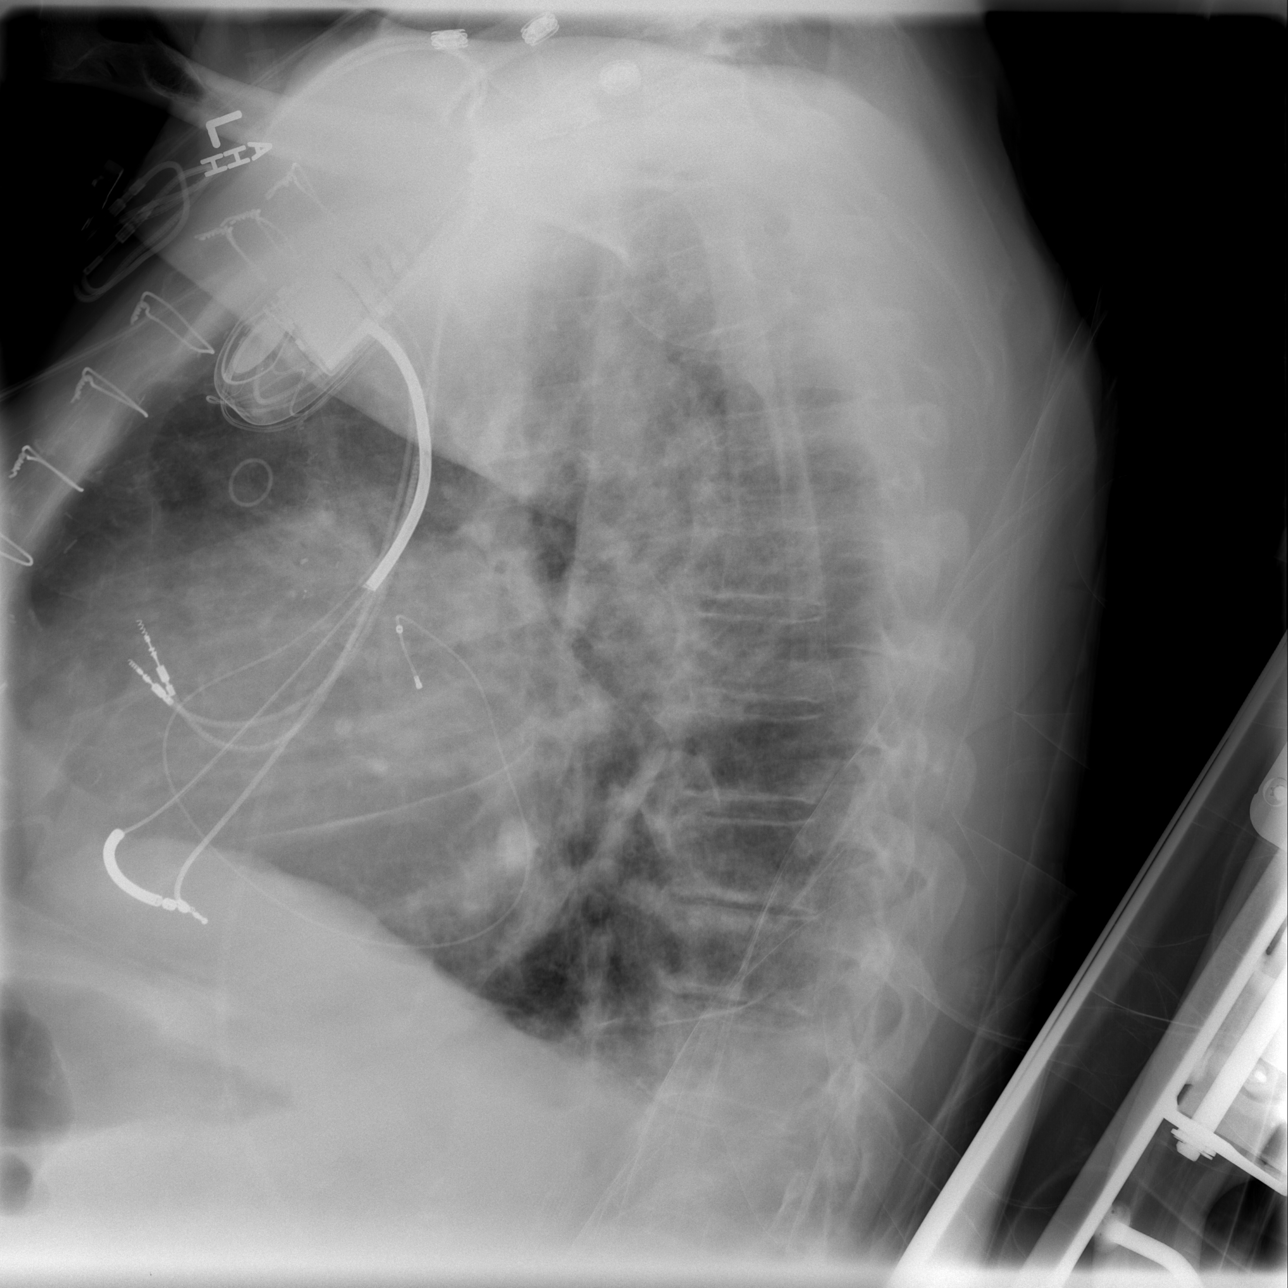

[view not recorded]
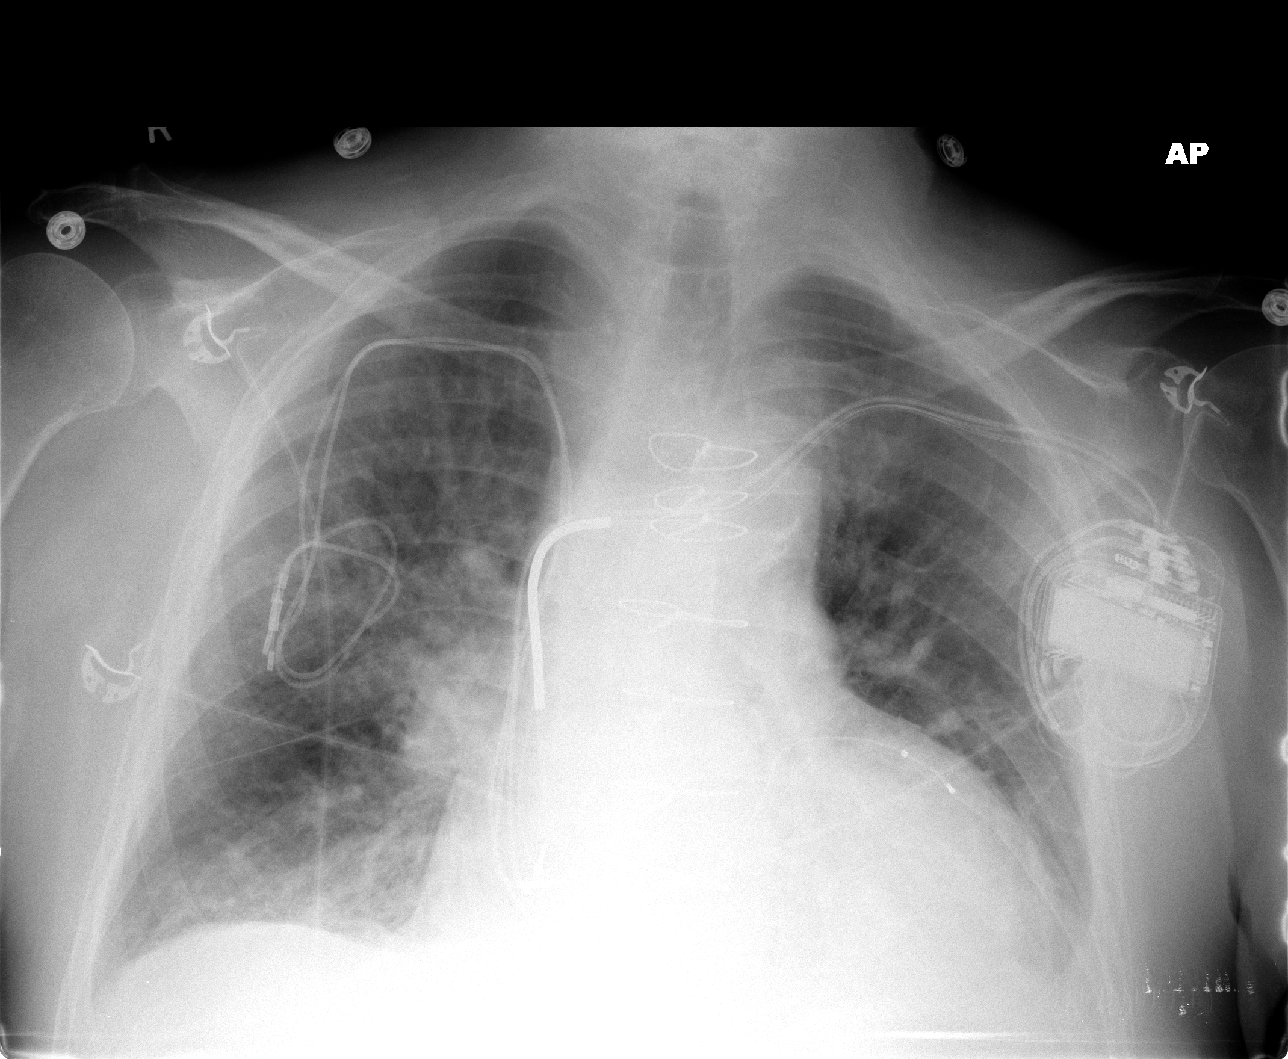

[3 of 3 positions shown; findings below may reference images not displayed]

FINDINGS: There is cardiomegaly. Small bilateral pleural effusions are
identified. There is interstitial pulmonary edema. Airspace opacity
in the right lower lung zone is now seen. Right perihilar airspace
opacity is unchanged.
IMPRESSION: Cardiomegaly interstitial edema.

Right perihilar opacity is again seen. Right lower lobe airspace
disease has worsened. Findings could be due to progressive pneumonia
and/or edema. Recommend followup films to clearing.

## 2015-04-15 MED ORDER — TRAMADOL HCL 50 MG PO TABS: ORAL_TABLET | ORAL | Status: DC

## 2015-04-15 NOTE — Telephone Encounter (Signed)
[facility] 

## 2015-04-16 ENCOUNTER — Other Ambulatory Visit: Payer: Self-pay | Admitting: *Deleted

## 2015-04-16 MED ORDER — TRAMADOL HCL 50 MG PO TABS: ORAL_TABLET | ORAL | Status: DC

## 2015-04-18 IMAGING — CT CT HEAD W/O CM
2 series · 15 of 30 positions shown, 19 images · non-contrast
Comparison: 01/04/2014

CLINICAL DATA: Altered mental status.  Weakness in dementia.

EXAM:
CT HEAD WITHOUT CONTRAST
TECHNIQUE: Contiguous axial images were obtained from the base of the skull
through the vertex without intravenous contrast.

[Series 2: head w/o · axial · non-contrast · 0.48mm/px · z∈[-598,-478]mm · 13 of 28 slices shown, 17 images]
[im 2/28  brain]
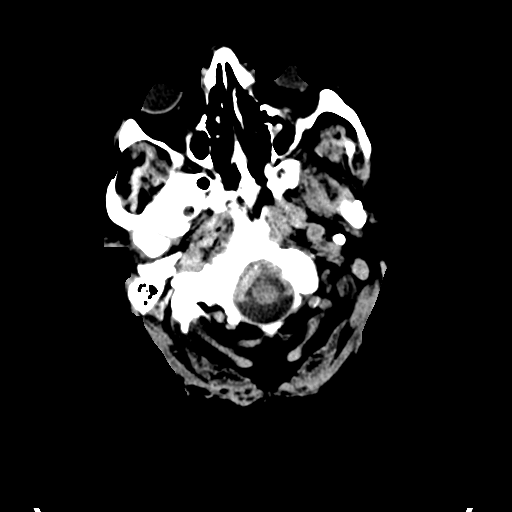
[im 2/28  bone]
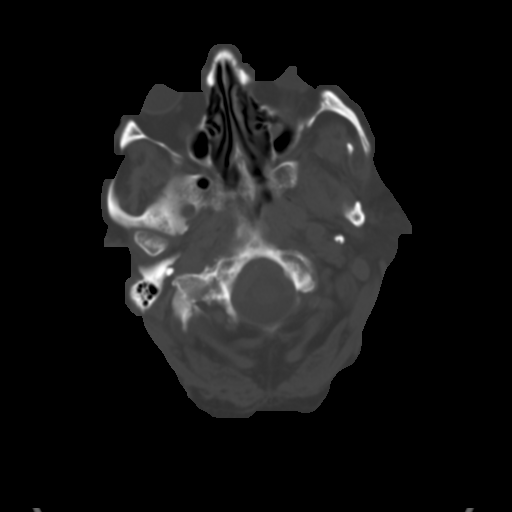
[im 4/28  brain]
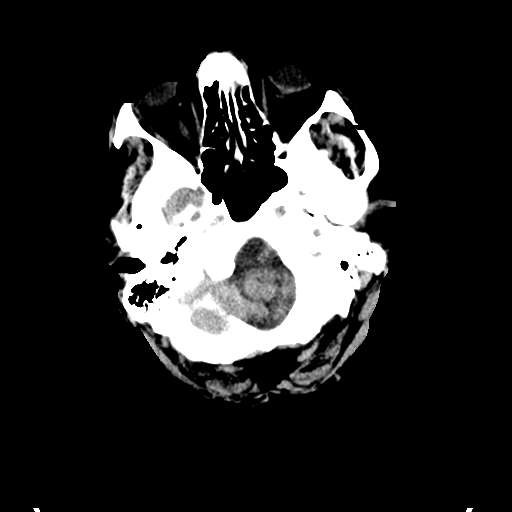
[im 6/28  brain]
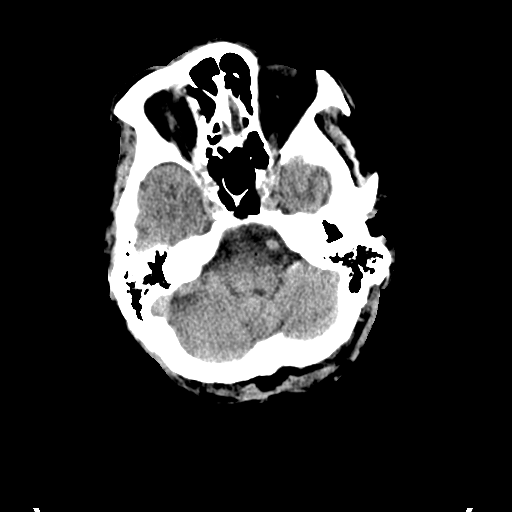
[im 8/28  brain]
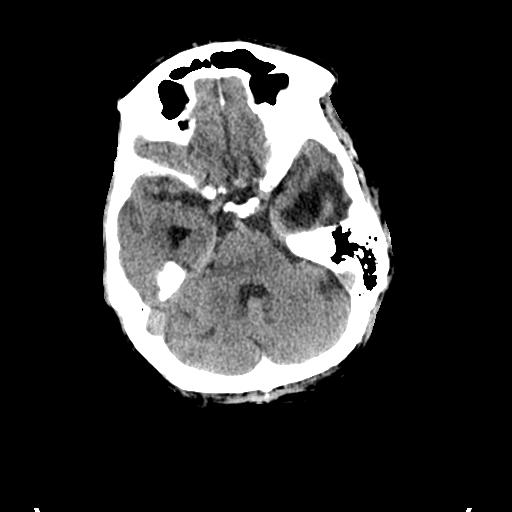
[im 10/28  brain]
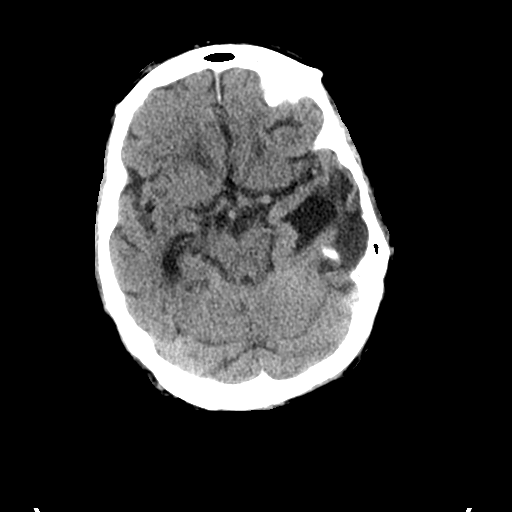
[im 10/28  bone]
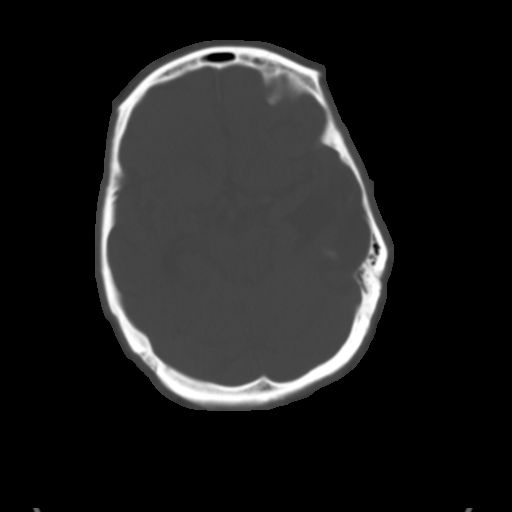
[im 12/28  brain]
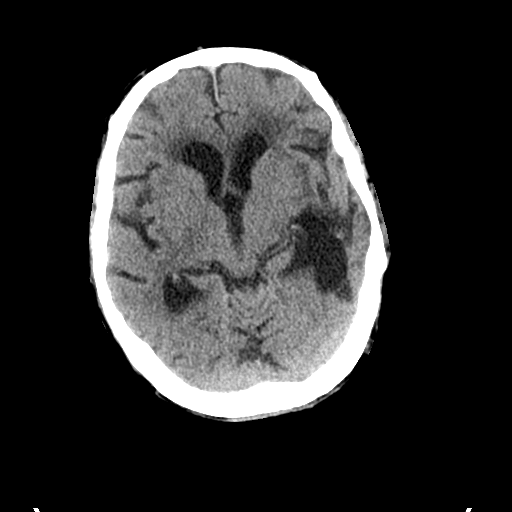
[im 14/28  brain]
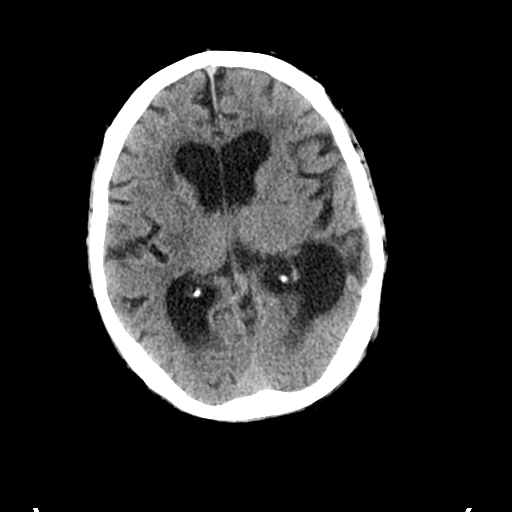
[im 16/28  brain]
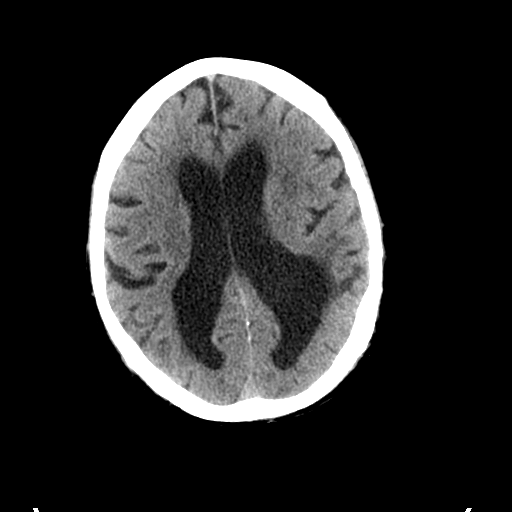
[im 18/28  brain]
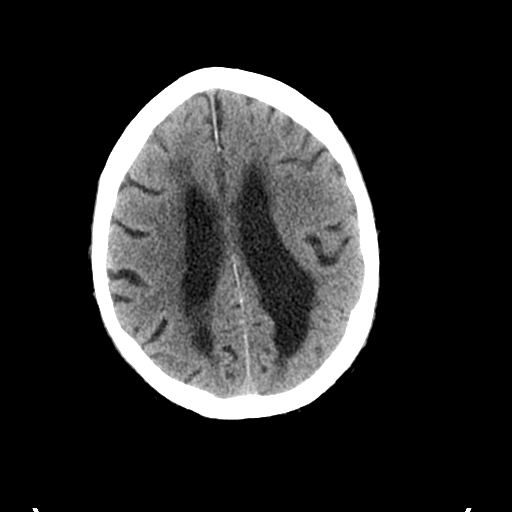
[im 18/28  bone]
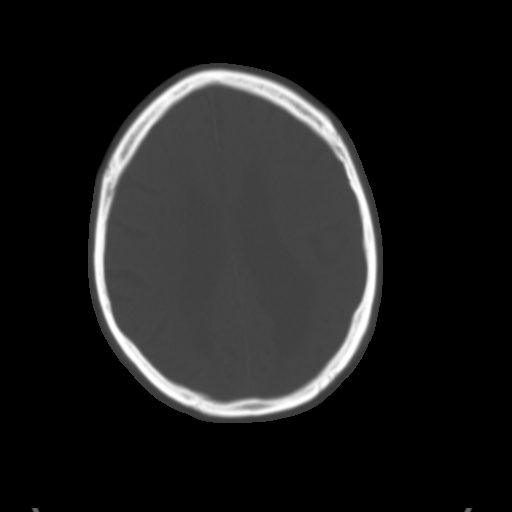
[im 20/28  brain]
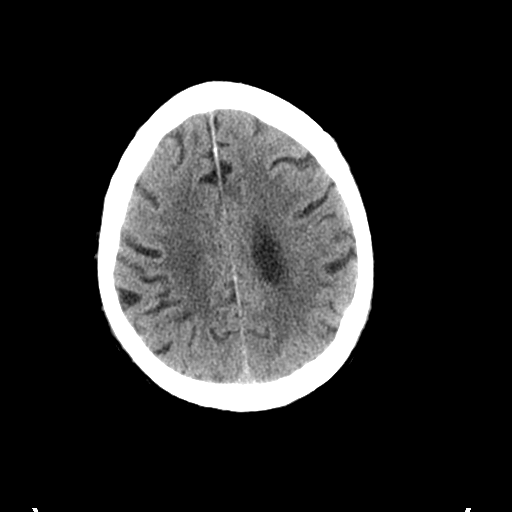
[im 22/28  brain]
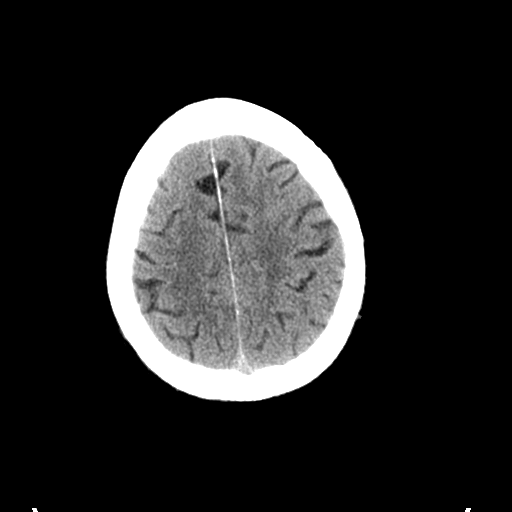
[im 24/28  brain]
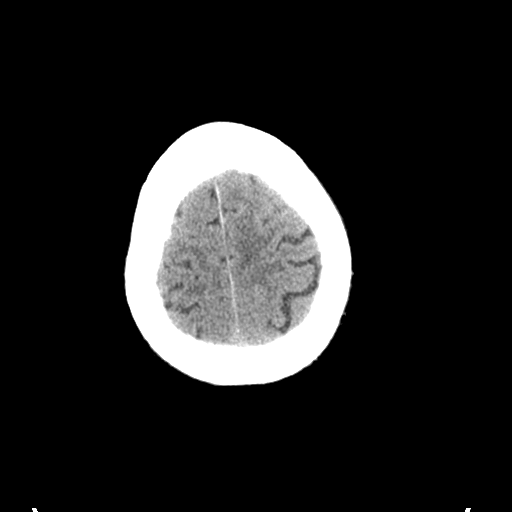
[im 26/28  brain]
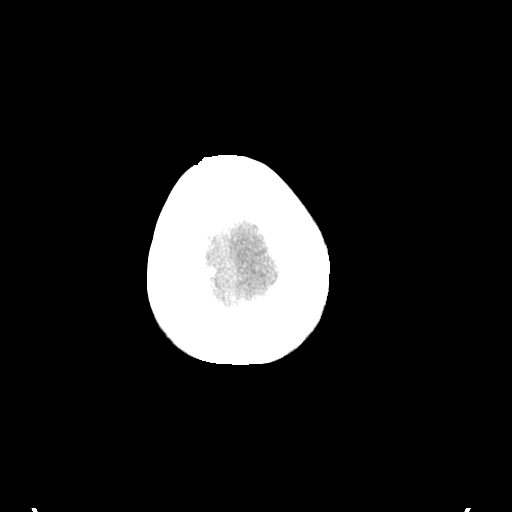
[im 26/28  bone]
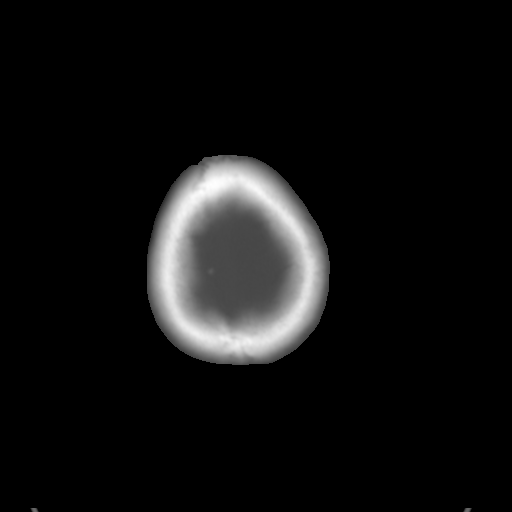

[Series 3: bone windows · axial · 0.48mm/px · z∈[-598,-578]mm · 2 of 28 slices shown]
[im 2/28  bone]
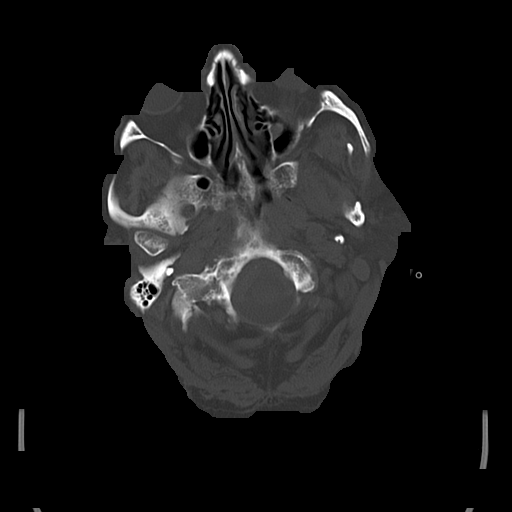
[im 6/28  bone]
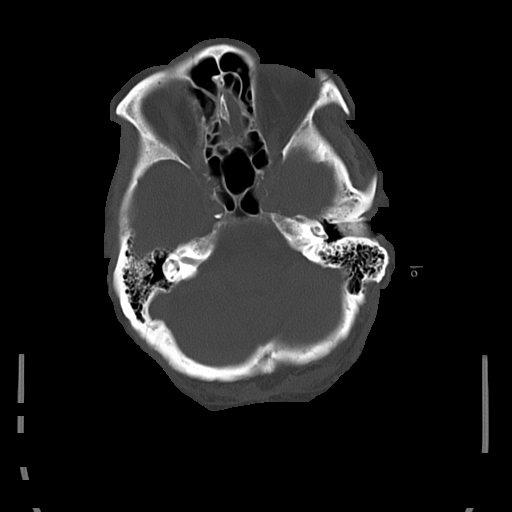

[15 of 30 positions shown; findings below may reference images not displayed]

FINDINGS: The brain shows generalized atrophy. There is old infarction in the
left middle cerebral artery territory affecting the temporal lobe,
with ex vacuo enlargement of the temporal horn of the left lateral
ventricle. There are chronic small vessel changes throughout the
hemispheric white matter. Chronic small-vessel changes affect the
pons. There are old small vessel cerebellar infarctions. No sign of
acute infarction, mass lesion, hemorrhage, hydrocephalus or
extra-axial collection. No calvarial abnormality. Sinuses, middle
ears and mastoids are clear.
IMPRESSION: No acute finding. Old left MCA branch vessel territory infarction
primarily affecting the left temporal lobe. Extensive chronic small
vessel disease.

## 2015-04-21 ENCOUNTER — Encounter: Payer: Self-pay | Admitting: Registered Nurse

## 2015-04-21 ENCOUNTER — Non-Acute Institutional Stay (SKILLED_NURSING_FACILITY): Payer: Medicare Other | Admitting: Registered Nurse

## 2015-04-21 DIAGNOSIS — R410 Disorientation, unspecified: Secondary | ICD-10-CM

## 2015-04-21 DIAGNOSIS — R296 Repeated falls: Secondary | ICD-10-CM | POA: Diagnosis not present

## 2015-04-21 DIAGNOSIS — N3090 Cystitis, unspecified without hematuria: Secondary | ICD-10-CM

## 2015-04-21 DIAGNOSIS — Z9181 History of falling: Secondary | ICD-10-CM

## 2015-04-21 NOTE — Progress Notes (Signed)
Patient ID: Troy Warren, male   DOB: 11-22-1931, 81 y.o.   MRN: [id number]   Place of Service: [facility]  Allergies  Allergen Reactions  . Lipitor [Atorvastatin]     weakness    Code Status: Full Code  Goals of Care: Longevity/LTC  Chief Complaint  Patient presents with  . Acute Visit    UTI, confusion    HPI 81 y.o. male long term resident with PMH of afib, HTN, hypothyroidism, depression w/ anxiety, mixed AD and vascular dementia, CHF, DM2, Right hip fracture among others is being seen for an acute visit at the request of nursing staff for the evaluation of increased confusion, poor safety awareness, and positive urine culture for UTI. Per nurse, patient has been screaming throughout her shift since his readmission from recent hospitalization for right hip fracture and attempted to get up unassisted several times through out the day. Seen in room today. Reported he needs help to go to the bathroom. Also reported having pain in right hip. Unable to describe pain in further details.   Review of Systems Constitutional: Negative for fever and chills HENT: Negative for ear pain and sore throat Eyes: Negative for eye pain Cardiovascular: Negative for chest pain, palpitations, and leg swelling Respiratory: Negative cough and shortness of breath Gastrointestinal: Negative for nausea and vomiting. Negative for abdominal pain Genitourinary: Negative for dysuria Musculoskeletal: See HPI Neurological: Negative for dizziness and headache   Past Medical History  Diagnosis Date  . Hypertension   . Ischemic heart disease   . Hyperlipemia   . Depression   . Testicular cancer   . Polio     child polio  . Rheumatic fever   . Mild dementia   . Mild dementia   . Systolic heart failure     acute on chronic  . CAD (coronary artery disease)   . Chronic atrial fibrillation   . Vitamin D deficiency   . Type II or unspecified type diabetes mellitus without mention of  complication, not stated as uncontrolled   . Other testicular hypofunction   . Permanent atrial fibrillation 01/21/2014  . ICD, biventricular, in situ - Medtronic Protecta 2013, leads 2009 01/21/2014    New Generator Medtronic [place] model number [id number], serial number [id number]  Right atrial lead (chronic) Medtronic, model number [id number], serial [id number] (implanted 05/01/2008)  Right ventricular lead (chronic) Medtronic, model number [id number], serial number [id number] (implanted 05/01/2008)  Left ventricular lead Medtronic, model number [id number], serial number [id number] (implanted 05/01/2008)  Exp  . Pleural effusion, right 03/18/2014  . Emphysema 03/18/2014  . Pulmonary infiltrate 05/10/2014    CT chest 03/2014:  RUL interstitial/GGO     Past Surgical History  Procedure Laterality Date  . Coronary artery bypass graft  06/26/2002    LIMA to diagonal artery & SVG to the first obtuse margin  . Nm myocar perf wall motion  09/15/2006    Dilated LV w/small area of possible nontransmural inferior scar w/mild perinfarct ischemia.  No significant change from previous study.  . Cardiac defibrillator placement  05/01/2008    Medtronic  . Biv icd genertaor change out N/A 10/27/2012    Procedure: BIV ICD GENERTAOR CHANGE OUT;  Surgeon: Sanda Klein, MD;  [facility] C[facility] LAB;  Service: Cardiovascular;  Laterality: N/A;  . Intramedullary (im) nail intertrochanteric Right 04/07/2015    Procedure: INTRAMEDULLARY (IM) NAIL INTERTROCHANTRIC;  Surgeon: Netta Cedars, MD;  Location: [place];  Service: Orthopedics;  Laterality: Right;    History  Social History  . Marital Status: Married    Spouse Name: Judeth Porch  . Number of Children: 0  . Years of Education: 8th   Occupational History  . retired    Social History Main Topics  . Smoking status: Former Smoker    Quit date: 12/24/1968  . Smokeless tobacco: Never Used  . Alcohol Use: No  . Drug Use: No  . Sexual Activity: No   Other Topics Concern  . Not on  file   Social History Narrative   Patient lives at home with spouse.   Patient has 8th grade education   Patient is right handed.   Caffeine Use: Rarely    Family History  Problem Relation Age of Onset  . Other Mother   . Heart attack Father       Medication List       This list is accurate as of: 04/21/15  4:38 PM.  Always use your most recent med list.               acetaminophen 325 MG tablet  Commonly known as:  TYLENOL  Take 1-2 tablets (325-650 mg total) by mouth every 6 (six) hours as needed for moderate pain.     ALPRAZolam 0.25 MG tablet  Commonly known as:  XANAX  Take 0.125 mg by mouth at bedtime.     bisacodyl 10 MG suppository  Commonly known as:  DULCOLAX  Place 1 suppository (10 mg total) rectally daily as needed for moderate constipation.     citalopram 40 MG tablet  Commonly known as:  CELEXA  Take 0.5 tablets (20 mg total) by mouth daily.     diltiazem 120 MG tablet  Commonly known as:  CARDIZEM  Take 1 tablet (120 mg total) by mouth 3 (three) times daily.     donepezil 23 MG Tabs tablet  Commonly known as:  ARICEPT  Take 1 tablet (23 mg total) by mouth at bedtime.     dorzolamide-timolol 22.3-6.8 MG/ML ophthalmic solution  Commonly known as:  COSOPT  Place 1 drop into both eyes 2 (two) times daily.     enoxaparin 40 MG/0.4ML injection  Commonly known as:  LOVENOX  Inject 0.4 mLs (40 mg total) into the skin daily. 30 days post op     ferrous fumarate 325 (106 FE) MG Tabs tablet  Commonly known as:  HEMOCYTE - 106 mg FE  Take 1 tablet by mouth 2 (two) times daily.     furosemide 40 MG tablet  Commonly known as:  LASIX  Take 1 tablet (40 mg total) by mouth daily.     HYDROcodone-acetaminophen 5-325 MG per tablet  Commonly known as:  NORCO/VICODIN  Take 1 tablet by mouth every 4 (four) hours as needed for moderate pain or severe pain.     insulin detemir 100 UNIT/ML injection  Commonly known as:  LEVEMIR  Inject 10 Units into the  skin at bedtime.     insulin lispro 100 UNIT/ML injection  Commonly known as:  HUMALOG  - Inject 5-10 Units into the skin 3 (three) times daily before meals. 5 units for cbg 250-350  - 8 units for cbg >350-450  - 10 units for cbg>450     isosorbide mononitrate 60 MG 24 hr tablet  Commonly known as:  IMDUR  Take 90 mg by mouth daily. Pt takes 1and 1/2 of the 60 mg pill to equal 90 mg.     KLOR-CON M10 10 MEQ tablet  Generic  drug:  potassium chloride  TAKE 1 TABLET TWICE DAILY     labetalol 200 MG tablet  Commonly known as:  NORMODYNE  TAKE 1 TABLET TWICE A DAY     levothyroxine 75 MCG tablet  Commonly known as:  SYNTHROID, LEVOTHROID  TAKE 1 TABLET EVERY DAY     linagliptin 5 MG Tabs tablet  Commonly known as:  TRADJENTA  Take 5 mg by mouth daily.     lisinopril 10 MG tablet  Commonly known as:  PRINIVIL,ZESTRIL  Take 10 mg by mouth daily.     loratadine 10 MG tablet  Commonly known as:  CLARITIN  Take 10 mg by mouth daily.     Olopatadine HCl 0.2 % Soln  Place 1 drop into both eyes 2 (two) times daily.     polyethylene glycol packet  Commonly known as:  MIRALAX / GLYCOLAX  Take 17 g by mouth daily.     pravastatin 40 MG tablet  Commonly known as:  PRAVACHOL  TAKE 1 TABLET EVERY DAY FOR CHOLESTEROL     traMADol 50 MG tablet  Commonly known as:  ULTRAM  Take one tablet by mouth twice daily for pain     VITAMIN B-12 PO  Take 1,000 mcg by mouth daily.     Vitamin D3 2000 UNITS Tabs  Take 2 tablets by mouth daily.       Physical Exam  BP 156/72 mmHg  Pulse 59  Temp(Src) 96.5 F (35.8 C)  Resp 18  Wt 157 lb 9.6 oz (71.487 kg)  Constitutional: frail elderly male in no acute distress. Conversant with confusions HEENT: Normocephalic and atraumatic. PERRL. EOM intact. No scleral icterus. Oral mucosa moist. Posterior pharynx clear of any exudate or lesions. . Cardiac: Normal S1, S2. Irregularly irregular without appreciable murmurs, rubs, or gallops.  Distal pulses intact. Trace pitting edema of BLE Lungs: No respiratory distress. Breath sounds clear bilaterally without rales, rhonchi, or wheezes. Abdomen: Audible bowel sounds in all quadrants. Soft, nontender, nondistended.  Musculoskeletal: Right hip surgical incision w/o signs of infection. Tender to touch Skin: Warm and dry.  Neurological: Alert  Psychiatric: Restless   Labs Reviewed  CBC Latest Ref Rng 04/10/2015 04/09/2015 04/08/2015  WBC 4.0 - 10.5 K/uL 10.9(H) 11.4(H) 13.1(H)  Hemoglobin 13.0 - 17.0 g/dL 10.3(L) 10.3(L) 11.4(L)  Hematocrit 39.0 - 52.0 % 31.9(L) 32.1(L) 35.6(L)  Platelets 150 - 400 K/uL 118(L) 105(L) 120(L)    CMP Latest Ref Rng 04/09/2015 04/08/2015 04/08/2015  Glucose 70 - 99 mg/dL 253(H) 134(H) -  BUN 6 - 23 mg/dL 26(H) 18 -  Creatinine 0.50 - 1.35 mg/dL 1.37(H) 1.11 1.22  Sodium 135 - 145 mmol/L 133(L) 142 -  Potassium 3.5 - 5.1 mmol/L 4.5 3.9 -  Chloride 96 - 112 mmol/L 101 108 -  CO2 19 - 32 mmol/L 24 27 -  Calcium 8.4 - 10.5 mg/dL 8.3(L) 8.7 -  Total Protein 6.0 - 8.3 g/dL - - -  Total Bilirubin 0.3 - 1.2 mg/dL - - -  Alkaline Phos 39 - 117 U/L - - -  AST 0 - 37 U/L - - -  ALT 0 - 53 U/L - - -    Lab Results  Component Value Date   HGBA1C 8.0* 04/08/2015    Lab Results  Component Value Date   TSH 0.985 04/08/2015    Lipid Panel     Component Value Date/Time   CHOL 116 11/04/2014   TRIG 66 11/04/2014   HDL 49 11/04/2014  CHOLHDL 2.5 08/27/2014 1018   VLDL 17 08/27/2014 1018   LDLCALC 54 11/04/2014   12/17//15: Urine mic/cr: 1020.4  Diagnostic Studies Reviewed 04/07/15: CXR No evidence of acute cardiopulmonary disease. No frank interstitial edema  04/07/15: Right Hip Xray: ORIF right intertrochanteric femoral fracture without evidence of hardware complication.  Assessment & Plan 1. Cystitis Continue and complete cipro 500mg  twice daily x 7 days with florastor 250mg  twice daily x 2 weeks  2. Acute delirium Most likely in the  setting of acute change in health status and UTI. Seroquel 12.5mg  daily x 14 days. Continue redirection and monitor for change in behavior.   3. At high risks for fall Secondary to hx of fall, impaired mobility, and confusion. Fall risk precautions   Family/Staff Communication Plan of care discussed with nursing staff. Nursing staff verbalized understanding and agree with plan of care. No additional questions or concerns reported.    Arthur Holms, MSN, AGNP-C [facility] [address] [place], [place] [address] [phone] [8am-5pm] After hours: [phone]

## 2015-04-22 LAB — LIPID PANEL: LDl/HDL Ratio: 8.9

## 2015-04-22 LAB — BASIC METABOLIC PANEL
BUN: 19 mg/dL (ref 4–21)
CREATININE: 1 mg/dL (ref 0.6–1.3)
GLUCOSE: 177 mg/dL
Potassium: 4.2 mmol/L (ref 3.4–5.3)
Sodium: 140 mmol/L (ref 137–147)

## 2015-04-22 LAB — CBC AND DIFFERENTIAL
HCT: 37 % — AB (ref 41–53)
Hemoglobin: 11.9 g/dL — AB (ref 13.5–17.5)
Platelets: 244 10*3/uL (ref 150–399)

## 2015-04-22 LAB — HEPATIC FUNCTION PANEL
ALT: 8 U/L — AB (ref 10–40)
AST: 10 U/L — AB (ref 14–40)
Alkaline Phosphatase: 116 U/L (ref 25–125)
Bilirubin, Total: 0.9 mg/dL

## 2015-04-24 ENCOUNTER — Other Ambulatory Visit: Payer: Self-pay | Admitting: *Deleted

## 2015-04-24 MED ORDER — HYDROCODONE-ACETAMINOPHEN 5-325 MG PO TABS
ORAL_TABLET | ORAL | Status: DC
Start: 2015-04-24 — End: 2015-05-15

## 2015-04-24 NOTE — Telephone Encounter (Signed)
[facility] 

## 2015-05-09 ENCOUNTER — Other Ambulatory Visit: Payer: Self-pay | Admitting: *Deleted

## 2015-05-09 MED ORDER — LORAZEPAM 0.5 MG PO TABS: ORAL_TABLET | ORAL | Status: DC

## 2015-05-09 NOTE — Telephone Encounter (Signed)
[facility] 

## 2015-05-15 ENCOUNTER — Other Ambulatory Visit: Payer: Self-pay | Admitting: *Deleted

## 2015-05-15 MED ORDER — HYDROCODONE-ACETAMINOPHEN 5-325 MG PO TABS: ORAL_TABLET | ORAL | Status: DC

## 2015-05-15 NOTE — Telephone Encounter (Signed)
[facility] 

## 2015-05-19 ENCOUNTER — Other Ambulatory Visit: Payer: Self-pay | Admitting: *Deleted

## 2015-05-19 MED ORDER — HYDROCODONE-ACETAMINOPHEN 5-325 MG PO TABS: ORAL_TABLET | ORAL | Status: DC

## 2015-05-19 NOTE — Telephone Encounter (Signed)
[facility] 

## 2015-05-29 ENCOUNTER — Ambulatory Visit (INDEPENDENT_AMBULATORY_CARE_PROVIDER_SITE_OTHER): Payer: Medicare Other | Admitting: Nurse Practitioner

## 2015-05-29 ENCOUNTER — Encounter: Payer: Self-pay | Admitting: Nurse Practitioner

## 2015-05-29 VITALS — BP 126/62 | HR 84

## 2015-05-29 DIAGNOSIS — F0150 Vascular dementia without behavioral disturbance: Secondary | ICD-10-CM

## 2015-05-29 DIAGNOSIS — F0280 Dementia in other diseases classified elsewhere without behavioral disturbance: Secondary | ICD-10-CM

## 2015-05-29 DIAGNOSIS — G309 Alzheimer's disease, unspecified: Secondary | ICD-10-CM

## 2015-05-29 NOTE — Progress Notes (Signed)
[facility]  PATIENT: Troy Warren DOB: [birth year]   REASON FOR VISIT: follow-up for vascular dementia, Alzheimer's dementia HISTORY FROM:Patient and wife  HISTORY OF PRESENT ILLNESS:[name], 81 year old male returns for follow-up. He was last seen in this office 10/07/2014 by Dr. Leonie Warren. He has history of vascular dementia and Alzheimer's dementia. He was placed on Aricept 23 mg at his last visit. He is now living at [facility] after sustaining a hip fracture at home. He has made the adjustment well according to the wife who accompanies him today. He continues to need help with activities of daily living. He is continuing to get physical therapy for his hip fracture. He does not have any hallucinations or violent behavior. He is sleeping well at night his appetite is good. He is had side effects to Namenda in the past. He returns for reevaluation   HISTORY: 81 y.o. Caucasian male with a vascular dementia following left temporal intercerebral hemorrhage in 2007 from warfarin related coagulopathy and atrial fibrillation. Multiple co-morbidities including SDAT, HTN, ASCAD/CABG, T1 IDDM w/CKD (Stage 3), Hypothyroidism, and testicular Cancer.  UPDATE 09/05/13 (LL): [name] returns with his wife for 2 month follow up after adding Namenda. Patient states he is feeling good, no complaints. Wife and daughter do not see any difference since starting Namenda except they feel he becomes agitated more often and may have more confusion. They would like to stop the medication. MMSE score today is 9/30, which is the same since last visit 2 months ago.   UPDATE 07/03/14 (LL): Patient comes in for dementia checkup accompanied by his daughter form Troy Warren and his wife. Since last visit, [name] was hospitalized 4/6-4/92015 with hemoptysis in the setting of Pneumonia vs lung mass, treated with Levaquin - sent home and readmitted 4/10-4/14/2015 with encepatholopathy and CHF  and was diuresed. Follow up chest ct showed complete clearing of his prior abnormality in the top of the right lung. He has become progressively more difficult to take care of at home. PCP ordered PT, OT, and Home Health, which has not helped. Daughter states he is stubborn like a child, and belligerent at times to his wife. He does not wander. He does not have hallucinations. He wants to sleep most of the day and the night. He needs assistance picking out appropriate clothes but does dress himself. He is now having trouble toileting, often "missing" the toilet. Wife is too weak to care for him alone, and they cannot afford in-home help.     REVIEW OF SYSTEMS: Full 14 system review of systems performed and notable only for those listed, all others are neg:  Constitutional: neg  Cardiovascular: neg Ear/Nose/Throat: hard of hearing  Skin: neg Eyes: neg Respiratory: shortness of breath Gastroitestinal: neg  Hematology/Lymphatic: easy bruising Endocrine: neg Musculoskeletal:joint pain, walking difficulty Allergy/Immunology: neg Neurological: memory loss Psychiatric: depression and anxiety Sleep : neg   ALLERGIES: Allergies  Allergen Reactions  . Lipitor [Atorvastatin]     weakness    HOME MEDICATIONS: Outpatient Prescriptions Prior to Visit  Medication Sig Dispense Refill  . acetaminophen (TYLENOL) 325 MG tablet Take 1-2 tablets (325-650 mg total) by mouth every 6 (six) hours as needed for moderate pain. 60 tablet 1  . ALPRAZolam (XANAX) 0.25 MG tablet Take 0.125 mg by mouth at bedtime.    . bisacodyl (DULCOLAX) 10 MG suppository Place 1 suppository (10 mg total) rectally daily as needed for moderate constipation. 12 suppository 0  . Cholecalciferol (VITAMIN D3) 2000 UNITS  TABS Take 2 tablets by mouth daily.     . citalopram (CELEXA) 40 MG tablet Take 0.5 tablets (20 mg total) by mouth daily. 90 tablet 3  . Cyanocobalamin (VITAMIN B-12 PO) Take 1,000 mcg by mouth daily.     Marland Kitchen  diltiazem (CARDIZEM) 120 MG tablet Take 1 tablet (120 mg total) by mouth 3 (three) times daily. 270 tablet 3  . donepezil (ARICEPT) 23 MG TABS tablet Take 1 tablet (23 mg total) by mouth at bedtime. 60 tablet 1  . dorzolamide-timolol (COSOPT) 22.3-6.8 MG/ML ophthalmic solution Place 1 drop into both eyes 2 (two) times daily.     Marland Kitchen enoxaparin (LOVENOX) 40 MG/0.4ML injection Inject 0.4 mLs (40 mg total) into the skin daily. 30 days post op 30 mL 0  . ferrous fumarate (HEMOCYTE - 106 MG FE) 325 (106 FE) MG TABS Take 1 tablet by mouth 2 (two) times daily.    . furosemide (LASIX) 40 MG tablet Take 1 tablet (40 mg total) by mouth daily. 30 tablet 9  . HYDROcodone-acetaminophen (NORCO/VICODIN) 5-325 MG per tablet Take one tablet by mouth every four hours as needed for pain. Do not exceed 4grams of Tylenol in 24 hours 180 tablet 0  . insulin detemir (LEVEMIR) 100 UNIT/ML injection Inject 10 Units into the skin at bedtime.     . insulin lispro (HUMALOG) 100 UNIT/ML injection Inject 5-10 Units into the skin 3 (three) times daily before meals. 5 units for cbg 250-350 8 units for cbg >350-450 10 units for cbg>450    . isosorbide mononitrate (IMDUR) 60 MG 24 hr tablet Take 90 mg by mouth daily. Pt takes 1and 1/2 of the 60 mg pill to equal 90 mg.    . KLOR-CON M10 10 MEQ tablet TAKE 1 TABLET TWICE DAILY 180 tablet 0  . labetalol (NORMODYNE) 200 MG tablet TAKE 1 TABLET TWICE A DAY 180 tablet 99  . levothyroxine (SYNTHROID, LEVOTHROID) 75 MCG tablet TAKE 1 TABLET EVERY DAY 90 tablet 3  . linagliptin (TRADJENTA) 5 MG TABS tablet Take 5 mg by mouth daily.    Marland Kitchen lisinopril (PRINIVIL,ZESTRIL) 10 MG tablet Take 10 mg by mouth daily.    Marland Kitchen loratadine (CLARITIN) 10 MG tablet Take 10 mg by mouth daily.    Marland Kitchen LORazepam (ATIVAN) 0.5 MG tablet Take one tablet by mouth at bedtime for anxiety 30 tablet 5  . Olopatadine HCl 0.2 % SOLN Place 1 drop into both eyes 2 (two) times daily.    . polyethylene glycol (MIRALAX / GLYCOLAX)  packet Take 17 g by mouth daily.    . pravastatin (PRAVACHOL) 40 MG tablet TAKE 1 TABLET EVERY DAY FOR CHOLESTEROL 90 tablet 3  . traMADol (ULTRAM) 50 MG tablet Take one tablet by mouth twice daily for pain 60 tablet 5   No facility-administered medications prior to visit.    PAST MEDICAL HISTORY: Past Medical History  Diagnosis Date  . Hypertension   . Ischemic heart disease   . Hyperlipemia   . Depression   . Testicular cancer   . Polio     child polio  . Rheumatic fever   . Mild dementia   . Mild dementia   . Systolic heart failure     acute on chronic  . CAD (coronary artery disease)   . Chronic atrial fibrillation   . Vitamin D deficiency   . Type II or unspecified type diabetes mellitus without mention of complication, not stated as uncontrolled   . Other testicular hypofunction   .  Permanent atrial fibrillation 01/21/2014  . ICD, biventricular, in situ - Medtronic Protecta 2013, leads 2009 01/21/2014    New Generator Medtronic [place] model number [id number], serial number [id number]  Right atrial lead (chronic) Medtronic, model number [id number], serial [id number] (implanted 05/01/2008)  Right ventricular lead (chronic) Medtronic, model number [id number], serial number [id number] (implanted 05/01/2008)  Left ventricular lead Medtronic, model number [id number], serial number [id number] (implanted 05/01/2008)  Exp  . Pleural effusion, right 03/18/2014  . Emphysema 03/18/2014  . Pulmonary infiltrate 05/10/2014    CT chest 03/2014:  RUL interstitial/GGO     PAST SURGICAL HISTORY: Past Surgical History  Procedure Laterality Date  . Coronary artery bypass graft  06/26/2002    LIMA to diagonal artery & SVG to the first obtuse margin  . Nm myocar perf wall motion  09/15/2006    Dilated LV w/small area of possible nontransmural inferior scar w/mild perinfarct ischemia.  No significant change from previous study.  . Cardiac defibrillator placement  05/01/2008    Medtronic  . Biv icd genertaor change out  N/A 10/27/2012    Procedure: BIV ICD GENERTAOR CHANGE OUT;  Surgeon: Sanda Klein, MD;  Location: [facility] CATH LAB;  Service: Cardiovascular;  Laterality: N/A;  . Intramedullary (im) nail intertrochanteric Right 04/07/2015    Procedure: INTRAMEDULLARY (IM) NAIL INTERTROCHANTRIC;  Surgeon: Netta Cedars, MD;  Location: [place];  Service: Orthopedics;  Laterality: Right;    FAMILY HISTORY: Family History  Problem Relation Age of Onset  . Other Mother   . Heart attack Father     SOCIAL HISTORY: History   Social History  . Marital Status: Married    Spouse Name: Judeth Porch  . Number of Children: 0  . Years of Education: 8th   Occupational History  . retired    Social History Main Topics  . Smoking status: Former Smoker    Quit date: 12/24/1968  . Smokeless tobacco: Never Used  . Alcohol Use: No  . Drug Use: No  . Sexual Activity: No   Other Topics Concern  . Not on Warren   Social History Narrative   Patient lives at [facility].     Patient has 8th grade education   Patient is right handed.   Caffeine Use: Rarely     PHYSICAL EXAM  Filed Vitals:   05/29/15 1447  BP: 126/62  Pulse: 84   There is no weight on Warren to calculate BMI. General: well developed, well nourished elderly Caucasian male, seated, in no evident distress  Head: head normocephalic and atraumatic.  Neck: supple with no carotid or supraclavicular bruits  Cardiovascular: regular rate and rhythm, no murmurs  Musculoskeletal: no deformity  Skin: no rash/petichiae  Vascular: Normal pulses all extremities   Neurologic Exam  Mental Status: Awake and fully alert. Disoriented to place and time. Recent and remote memory diminished. Attention span, concentration and fund of knowledge diminished. Mood and affect pleasant. MMSE 8/30.   Cranial Nerves: Fundoscopic exam not done Pupils equal, briskly reactive to light. Extraocular movements full without nystagmus. Visual fields full to confrontation. Hard of  hearing Facial sensation intact. Face, tongue, palate moves normally and symmetrically.  Motor: Normal bulk and tone. Normal strength in all tested extremity muscles except right lower extremity after hip fracture.  Sensory: intact to touch and pinprick and vibratory.  Coordination: Rapid alternating movements normal in all extremities. Finger-to-nose performed accurately bilaterally.  Gait and Station: in wheelchair  Reflexes: 1+ and symmetric. Toes downgoing.   DIAGNOSTIC DATA (LABS, IMAGING,  TESTING) - I reviewed patient records, labs, notes, testing and imaging myself where available.  Lab Results  Component Value Date   WBC 10.9* 04/10/2015   HGB 10.3* 04/10/2015   HCT 31.9* 04/10/2015   MCV 89.9 04/10/2015   PLT 118* 04/10/2015      Component Value Date/Time   NA 133* 04/09/2015 0620   NA 140 03/10/2015   K 4.5 04/09/2015 0620   CL 101 04/09/2015 0620   CO2 24 04/09/2015 0620   GLUCOSE 253* 04/09/2015 0620   BUN 26* 04/09/2015 0620   BUN 16 03/10/2015   CREATININE 1.37* 04/09/2015 0620   CREATININE 1.0 03/10/2015   CREATININE 1.10 08/27/2014 1018   CALCIUM 8.3* 04/09/2015 0620   PROT 5.9* 10/24/2014 0845   ALBUMIN 3.1* 10/24/2014 0845   AST 10 10/24/2014 0845   ALT <5 10/24/2014 0845   ALKPHOS 68 10/24/2014 0845   BILITOT 0.9 10/24/2014 0845   GFRNONAA 47* 04/09/2015 0620   GFRNONAA 63 08/27/2014 1018   GFRAA 54* 04/09/2015 0620   GFRAA 73 08/27/2014 1018   Lab Results  Component Value Date   CHOL 116 11/04/2014   HDL 49 11/04/2014   LDLCALC 54 11/04/2014   TRIG 66 11/04/2014   CHOLHDL 2.5 08/27/2014   Lab Results  Component Value Date   HGBA1C 8.0* 04/08/2015    Lab Results  Component Value Date   TSH 0.985 04/08/2015      ASSESSMENT AND PLAN  81 y.o. year old male  has a past medical history of dementia following left temporal intracerebral hemorrhage in 2007 from warfarin related coagulopathy and atrial fibrillation. Progression of  dementia may be related to superimposed Alzheimer's disease. He obtained no benefit from [facility] but this caused increased agitation.The patient is a current patient of Dr. Leonie Warren  who is out of the office today . This note is sent to the work in doctor.     Continue Aricept 23 mg daily Call for any behavioral changes such as hallucinations or agitation Follow-up in 6 months Dennie Bible, [facility], [facility], [facility] [address], [place], [place] [address] [phone]

## 2015-05-29 NOTE — Patient Instructions (Signed)
Per nsg home sheet 

## 2015-05-29 NOTE — Progress Notes (Signed)
I reviewed note and agree with plan.   Penni Bombard, MD 1/60/1093, 2:35 PM Certified in Neurology, Neurophysiology and Neuroimaging  [facility] [address], [place], [place] [address] [phone]

## 2015-06-03 ENCOUNTER — Encounter: Payer: Medicare Other | Admitting: Cardiovascular Disease

## 2015-06-18 ENCOUNTER — Non-Acute Institutional Stay (SKILLED_NURSING_FACILITY): Payer: Medicare Other | Admitting: Nurse Practitioner

## 2015-06-18 ENCOUNTER — Telehealth: Payer: Self-pay | Admitting: Cardiology

## 2015-06-18 ENCOUNTER — Encounter: Payer: Medicare Other | Admitting: *Deleted

## 2015-06-18 DIAGNOSIS — S72141S Displaced intertrochanteric fracture of right femur, sequela: Secondary | ICD-10-CM | POA: Diagnosis not present

## 2015-06-18 DIAGNOSIS — F0150 Vascular dementia without behavioral disturbance: Secondary | ICD-10-CM | POA: Diagnosis not present

## 2015-06-18 DIAGNOSIS — E1129 Type 2 diabetes mellitus with other diabetic kidney complication: Secondary | ICD-10-CM

## 2015-06-18 DIAGNOSIS — E039 Hypothyroidism, unspecified: Secondary | ICD-10-CM | POA: Diagnosis not present

## 2015-06-18 DIAGNOSIS — G309 Alzheimer's disease, unspecified: Secondary | ICD-10-CM | POA: Diagnosis not present

## 2015-06-18 DIAGNOSIS — F418 Other specified anxiety disorders: Secondary | ICD-10-CM

## 2015-06-18 DIAGNOSIS — I5042 Chronic combined systolic (congestive) and diastolic (congestive) heart failure: Secondary | ICD-10-CM

## 2015-06-18 DIAGNOSIS — F0280 Dementia in other diseases classified elsewhere without behavioral disturbance: Secondary | ICD-10-CM | POA: Diagnosis not present

## 2015-06-18 DIAGNOSIS — I482 Chronic atrial fibrillation: Secondary | ICD-10-CM | POA: Diagnosis not present

## 2015-06-18 DIAGNOSIS — N183 Chronic kidney disease, stage 3 unspecified: Secondary | ICD-10-CM

## 2015-06-18 DIAGNOSIS — N058 Unspecified nephritic syndrome with other morphologic changes: Secondary | ICD-10-CM | POA: Diagnosis not present

## 2015-06-18 DIAGNOSIS — I4821 Permanent atrial fibrillation: Secondary | ICD-10-CM

## 2015-06-18 NOTE — Telephone Encounter (Signed)
LMOVM reminding pt to send remote transmission.   

## 2015-06-18 NOTE — Progress Notes (Signed)
Patient ID: Troy Warren, male   DOB: June 24, 1931, 81 y.o.   MRN: [id number]    Nursing Home Location:  [place] of Service: SNF (31)  PCP: Blanchie Serve, MD  Allergies  Allergen Reactions  . Lipitor [Atorvastatin]     weakness    Chief Complaint  Patient presents with  . Medical Management of Chronic Issues    HPI:  Patient is a 81 y.o. male seen today at [facility] for routine follow up on chronic conditions. Pt with pmh of afib, HTN, hypothyroidism, depression w/ anxiety, mixed AD and vascular dementia, CHF, DM2. Pt recently readmitted after fall in facility causing right femur fracture now s/p IM nailing. Had some delirium on readmission but this has improved and confusion has stabilized. Pt without complaints of pain. Hx of right heel pressure ulcer followed by wound care has now resolved. Nursing without any acute concerns. Pt without concerns.   Review of Systems:  Review of Systems  Constitutional: Negative for activity change, appetite change, fatigue and unexpected weight change.  HENT: Negative for congestion and hearing loss.   Eyes: Negative.   Respiratory: Negative for cough and shortness of breath.   Cardiovascular: Negative for chest pain, palpitations and leg swelling.  Gastrointestinal: Negative for abdominal pain, diarrhea and constipation.  Genitourinary: Negative for dysuria and difficulty urinating.  Musculoskeletal: Negative for myalgias and arthralgias.  Skin: Negative for color change and wound.  Neurological: Negative for dizziness and weakness.  Psychiatric/Behavioral: Positive for behavioral problems and confusion. Negative for agitation.    Past Medical History  Diagnosis Date  . Hypertension   . Ischemic heart disease   . Hyperlipemia   . Depression   . Testicular cancer   . Polio     child polio  . Rheumatic fever   . Mild dementia   . Mild dementia   . Systolic heart failure    acute on chronic  . CAD (coronary artery disease)   . Chronic atrial fibrillation   . Vitamin D deficiency   . Type II or unspecified type diabetes mellitus without mention of complication, not stated as uncontrolled   . Other testicular hypofunction   . Permanent atrial fibrillation 01/21/2014  . ICD, biventricular, in situ - Medtronic Protecta 2013, leads 2009 01/21/2014    New Generator Medtronic [place] model number [id number], serial number [id number]  Right atrial lead (chronic) Medtronic, model number [id number], serial [id number] (implanted 05/01/2008)  Right ventricular lead (chronic) Medtronic, model number [id number], serial number [id number] (implanted 05/01/2008)  Left ventricular lead Medtronic, model number [id number], serial number [id number] (implanted 05/01/2008)  Exp  . Pleural effusion, right 03/18/2014  . Emphysema 03/18/2014  . Pulmonary infiltrate 05/10/2014    CT chest 03/2014:  RUL interstitial/GGO    Past Surgical History  Procedure Laterality Date  . Coronary artery bypass graft  06/26/2002    LIMA to diagonal artery & SVG to the first obtuse margin  . Nm myocar perf wall motion  09/15/2006    Dilated LV w/small area of possible nontransmural inferior scar w/mild perinfarct ischemia.  No significant change from previous study.  . Cardiac defibrillator placement  05/01/2008    Medtronic  . Biv icd genertaor change out N/A 10/27/2012    Procedure: BIV ICD GENERTAOR CHANGE OUT;  Surgeon: Sanda Klein, MD;  Location: [facility] CATH LAB;  Service: Cardiovascular;  Laterality: N/A;  . Intramedullary (im) nail intertrochanteric Right 04/07/2015    Procedure:  INTRAMEDULLARY (IM) NAIL INTERTROCHANTRIC;  Surgeon: Netta Cedars, MD;  Location: [place];  Service: Orthopedics;  Laterality: Right;   Social History:   reports that he quit smoking about 46 years ago. He has never used smokeless tobacco. He reports that he does not drink alcohol or use illicit drugs.  Family History  Problem Relation Age of Onset    . Other Mother   . Heart attack Father     Medications: Patient's Medications  New Prescriptions   No medications on file  Previous Medications   ACETAMINOPHEN (TYLENOL) 325 MG TABLET    Take 1-2 tablets (325-650 mg total) by mouth every 6 (six) hours as needed for moderate pain.   ASPIRIN 81 MG TABLET    Take 81 mg by mouth daily.   BISACODYL (DULCOLAX) 10 MG SUPPOSITORY    Place 1 suppository (10 mg total) rectally daily as needed for moderate constipation.   CHOLECALCIFEROL (VITAMIN D3) 2000 UNITS TABS    Take 2 tablets by mouth daily.    CITALOPRAM (CELEXA) 40 MG TABLET    Take 0.5 tablets (20 mg total) by mouth daily.   CYANOCOBALAMIN (VITAMIN B-12 PO)    Take 1,000 mcg by mouth daily.    DILTIAZEM (CARDIZEM) 120 MG TABLET    Take 1 tablet (120 mg total) by mouth 3 (three) times daily.   DONEPEZIL (ARICEPT) 23 MG TABS TABLET    Take 1 tablet (23 mg total) by mouth at bedtime.   DORZOLAMIDE-TIMOLOL (COSOPT) 22.3-6.8 MG/ML OPHTHALMIC SOLUTION    Place 1 drop into both eyes 2 (two) times daily.    FERROUS FUMARATE (HEMOCYTE - 106 MG FE) 325 (106 FE) MG TABS    Take 1 tablet by mouth 2 (two) times daily.   FUROSEMIDE (LASIX) 40 MG TABLET    Take 1 tablet (40 mg total) by mouth daily.   HYDROCODONE-ACETAMINOPHEN (NORCO/VICODIN) 5-325 MG PER TABLET    Take one tablet by mouth every four hours as needed for pain. Do not exceed 4grams of Tylenol in 24 hours   INSULIN DETEMIR (LEVEMIR) 100 UNIT/ML INJECTION    Inject 10 Units into the skin at bedtime.    INSULIN LISPRO (HUMALOG) 100 UNIT/ML INJECTION    Inject 5-10 Units into the skin 3 (three) times daily before meals. 5 units for cbg 250-350 8 units for cbg >350-450 10 units for cbg>450   ISOSORBIDE MONONITRATE (IMDUR) 60 MG 24 HR TABLET    Take 90 mg by mouth daily. Pt takes 1and 1/2 of the 60 mg pill to equal 90 mg.   KLOR-CON M10 10 MEQ TABLET    TAKE 1 TABLET TWICE DAILY   LABETALOL (NORMODYNE) 200 MG TABLET    TAKE 1 TABLET TWICE A  DAY   LEVOTHYROXINE (SYNTHROID, LEVOTHROID) 75 MCG TABLET    TAKE 1 TABLET EVERY DAY   LINAGLIPTIN (TRADJENTA) 5 MG TABS TABLET    Take 5 mg by mouth daily.   LISINOPRIL (PRINIVIL,ZESTRIL) 10 MG TABLET    Take 10 mg by mouth daily.   LORATADINE (CLARITIN) 10 MG TABLET    Take 10 mg by mouth daily.   LORAZEPAM (ATIVAN) 0.5 MG TABLET    Take one tablet by mouth at bedtime for anxiety   OLOPATADINE HCL 0.2 % SOLN    Place 1 drop into both eyes 2 (two) times daily.   POLYETHYLENE GLYCOL (MIRALAX / GLYCOLAX) PACKET    Take 17 g by mouth daily.   PRAVASTATIN (PRAVACHOL) 40 MG  TABLET    TAKE 1 TABLET EVERY DAY FOR CHOLESTEROL   TRAMADOL (ULTRAM) 50 MG TABLET    Take one tablet by mouth twice daily for pain   UNABLE TO FIND    Med Name:pro- stat SF wild cherry. 40ml po daily.  Modified Medications   No medications on file  Discontinued Medications   ENOXAPARIN (LOVENOX) 40 MG/0.4ML INJECTION    Inject 0.4 mLs (40 mg total) into the skin daily. 30 days post op     Physical Exam: Filed Vitals:   06/18/15 1511  BP: 121/63  Pulse: 74  Temp: 98.4 F (36.9 C)  Resp: 20  Weight: 157 lb (71.215 kg)    Physical Exam  Constitutional: He is oriented to person, place, and time. He appears well-developed and well-nourished. No distress.  Frail elderly male  HENT:  Head: Normocephalic and atraumatic.  Mouth/Throat: Oropharynx is clear and moist. No oropharyngeal exudate.  Eyes: Conjunctivae and EOM are normal. Pupils are equal, round, and reactive to light.  Neck: Normal range of motion. Neck supple.  Cardiovascular: Normal rate and normal heart sounds.  An irregularly irregular rhythm present.  Pulmonary/Chest: Effort normal and breath sounds normal.  Abdominal: Soft. Bowel sounds are normal.  Musculoskeletal: He exhibits no edema or tenderness.  Neurological: He is alert and oriented to person, place, and time.  Skin: Skin is warm and dry. He is not diaphoretic.  Psychiatric: He has a normal  mood and affect. Cognition and memory are impaired.    Labs reviewed: Basic Metabolic Panel:  Recent Labs  04/07/15 1832 04/07/15 1853 04/08/15 0100 04/08/15 0720 04/09/15 0620  NA 138 138  --  142 133*  K 4.1 4.0  --  3.9 4.5  CL 106 104  --  108 101  CO2 23  --   --  27 24  GLUCOSE 344* 347*  --  134* 253*  BUN 20 21  --  18 26*  CREATININE 1.15 1.10 1.22 1.11 1.37*  CALCIUM 8.9  --   --  8.7 8.3*   Liver Function Tests:  Recent Labs  08/27/14 1018 10/24/14 0845  AST 12 10  ALT <8 <5  ALKPHOS 80 68  BILITOT 1.2 0.9  PROT 6.1 5.9*  ALBUMIN 3.8 3.1*   No results for input(s): LIPASE, AMYLASE in the last 8760 hours. No results for input(s): AMMONIA in the last 8760 hours. CBC:  Recent Labs  08/27/14 1018 10/24/14 0845  04/07/15 1832  04/08/15 0720 04/09/15 0620 04/10/15 0524  WBC 8.5 8.2  < > 11.6*  < > 13.1* 11.4* 10.9*  NEUTROABS 7.0 6.3  --  9.6*  --   --   --   --   HGB 13.4 13.6  < > 12.7*  < > 11.4* 10.3* 10.3*  HCT 40.0 42.0  < > 38.0*  < > 35.6* 32.1* 31.9*  MCV 87.7 88.2  --  85.6  < > 87.9 89.7 89.9  PLT 177 140*  < > 113*  < > 120* 105* 118*  < > = values in this interval not displayed. TSH:  Recent Labs  08/27/14 1018 11/04/14 04/08/15 0100  TSH 1.064 0.85 0.985   A1C: Lab Results  Component Value Date   HGBA1C 8.0* 04/08/2015   Lipid Panel:  Recent Labs  08/27/14 1018 11/04/14  CHOL 126 116  HDL 50 49  LDLCALC 59 54  TRIG 87 66  CHOLHDL 2.5  --      Assessment/Plan 1.  Chronic combined systolic and diastolic CHF (congestive heart failure)  Echo 03/2014 showed EF 25-30% Continues on lisinopril 10mg  daily, labetalol 200mg  twice daily, lasix 40 mg daily with potassium supplement and imdur 90mg  daily. Staff monitoring daily weight. will follow up bmp next labs  2. Permanent atrial fibrillation Rate controlled, conts on ASA only due to hx of left temporal intracerebral hemorrhage in 2007  3. DM (diabetes mellitus) type II  controlled with renal manifestation Continues Levemir 10 units daily and tranjenta 5mg  daily with Humalog TID before meals. Continues lisinopril and pravachol with baby aspirin. Blood sugars variable from 87-200 fasting, last A1c elevated at 8.0, will follow up A1c in 1 month  4. Hypothyroidism, unspecified hypothyroidism type Continues levothyroxine 69mcg daily.   5. Mixed Alzheimer's and vascular dementia conts on aricept, side effect noted to namenda in the past Following with neurology  6. Chronic kidney disease, stage III (moderate) Will follow up cmp   7. Depression with anxiety Was having episodes of increased anxiety, now mood stable. Continue celexa 20mg  daily with ativan 0.5mg  at bedtime.   8. Right intertrochanteric hip fracture S/p IM nailing in march after fall, ongoing fall precautions, pain controlled at this time.   Carlos American. Harle Battiest  [facility] [phone] 8 am - 5 pm) [phone] (after hours)

## 2015-06-19 ENCOUNTER — Encounter: Payer: Self-pay | Admitting: Cardiology

## 2015-06-25 ENCOUNTER — Non-Acute Institutional Stay (SKILLED_NURSING_FACILITY): Payer: Medicare Other | Admitting: Nurse Practitioner

## 2015-06-25 ENCOUNTER — Encounter: Payer: Self-pay | Admitting: Nurse Practitioner

## 2015-06-25 DIAGNOSIS — M25561 Pain in right knee: Secondary | ICD-10-CM | POA: Diagnosis not present

## 2015-06-25 DIAGNOSIS — H109 Unspecified conjunctivitis: Secondary | ICD-10-CM

## 2015-06-25 NOTE — Progress Notes (Signed)
Patient ID: Troy Warren, male   DOB: February 13, 1931, 81 y.o.   MRN: [id number]    Nursing Home Location:  [place] of Service: SNF (31)  PCP: Blanchie Serve, MD  Allergies  Allergen Reactions  . Lipitor [Atorvastatin]     weakness    Chief Complaint  Patient presents with  . Acute Visit    Patient c/o right hip pain and eye issues     HPI:  Patient is a 81 y.o. male seen today at [facility] at the request of nursing due to eye discharge and pain in leg. Pt with pmh of afib, HTN, hypothyroidism, depression w/ anxiety, mixed AD and vascular dementia, CHF, DM2. Pt recently readmitted after fall in facility causing right femur fracture now s/p IM nailing. Pt denies hip pain and is able to walk around the room with a walker without difficulty but does report leg bothers him at times and points to around his left knee. No acute injury or new fall. Pt denies pain to eyes, itching or blurred vision.  Eyes have been red and matted. Greenish discharged noted by nursing.  Review of Systems:  Review of Systems  Constitutional: Negative for activity change, appetite change, fatigue and unexpected weight change.  HENT: Negative for congestion and hearing loss.   Eyes: Positive for discharge and redness. Negative for photophobia, pain, itching and visual disturbance.  Respiratory: Negative for cough and shortness of breath.   Cardiovascular: Negative for chest pain, palpitations and leg swelling.  Gastrointestinal: Negative for abdominal pain, diarrhea and constipation.  Genitourinary: Negative for dysuria and difficulty urinating.  Musculoskeletal: Positive for myalgias (around knee). Negative for arthralgias.  Skin: Negative for color change and wound.  Neurological: Negative for dizziness and weakness.  Psychiatric/Behavioral: Positive for behavioral problems and confusion. Negative for agitation.    Past Medical History  Diagnosis Date    . Hypertension   . Ischemic heart disease   . Hyperlipemia   . Depression   . Testicular cancer   . Polio     child polio  . Rheumatic fever   . Mild dementia   . Mild dementia   . Systolic heart failure     acute on chronic  . CAD (coronary artery disease)   . Chronic atrial fibrillation   . Vitamin D deficiency   . Type II or unspecified type diabetes mellitus without mention of complication, not stated as uncontrolled   . Other testicular hypofunction   . Permanent atrial fibrillation 01/21/2014  . ICD, biventricular, in situ - Medtronic Protecta 2013, leads 2009 01/21/2014    New Generator Medtronic [place] model number [id number], serial number [id number]  Right atrial lead (chronic) Medtronic, model number [id number], serial [id number] (implanted 05/01/2008)  Right ventricular lead (chronic) Medtronic, model number [id number], serial number [id number] (implanted 05/01/2008)  Left ventricular lead Medtronic, model number [id number], serial number [id number] (implanted 05/01/2008)  Exp  . Pleural effusion, right 03/18/2014  . Emphysema 03/18/2014  . Pulmonary infiltrate 05/10/2014    CT chest 03/2014:  RUL interstitial/GGO    Past Surgical History  Procedure Laterality Date  . Coronary artery bypass graft  06/26/2002    LIMA to diagonal artery & SVG to the first obtuse margin  . Nm myocar perf wall motion  09/15/2006    Dilated LV w/small area of possible nontransmural inferior scar w/mild perinfarct ischemia.  No significant change from previous study.  . Cardiac defibrillator placement  05/01/2008    Medtronic  . Biv icd genertaor change out N/A 10/27/2012    Procedure: BIV ICD GENERTAOR CHANGE OUT;  Surgeon: Sanda Klein, MD;  Location: [facility] CATH LAB;  Service: Cardiovascular;  Laterality: N/A;  . Intramedullary (im) nail intertrochanteric Right 04/07/2015    Procedure: INTRAMEDULLARY (IM) NAIL INTERTROCHANTRIC;  Surgeon: Netta Cedars, MD;  Location: [place];  Service: Orthopedics;  Laterality: Right;    Social History:   reports that he quit smoking about 46 years ago. He has never used smokeless tobacco. He reports that he does not drink alcohol or use illicit drugs.  Family History  Problem Relation Age of Onset  . Other Mother   . Heart attack Father     Medications: Patient's Medications  New Prescriptions   No medications on file  Previous Medications   AMINO ACIDS-PROTEIN HYDROLYS (FEEDING SUPPLEMENT, PRO-STAT SUGAR FREE 64,) LIQD    Take 30 mLs by mouth daily.   ASPIRIN 81 MG TABLET    Take 81 mg by mouth daily.   BISACODYL (DULCOLAX) 10 MG SUPPOSITORY    Place 1 suppository (10 mg total) rectally daily as needed for moderate constipation.   CHOLECALCIFEROL (VITAMIN D3) 2000 UNITS TABS    Take 2 tablets by mouth daily.    CITALOPRAM (CELEXA) 40 MG TABLET    Take 0.5 tablets (20 mg total) by mouth daily.   CYANOCOBALAMIN (VITAMIN B-12 PO)    Take 1,000 mcg by mouth daily.    DILTIAZEM (CARDIZEM) 120 MG TABLET    Take 1 tablet (120 mg total) by mouth 3 (three) times daily.   DONEPEZIL (ARICEPT) 23 MG TABS TABLET    Take 1 tablet (23 mg total) by mouth at bedtime.   DORZOLAMIDE-TIMOLOL (COSOPT) 22.3-6.8 MG/ML OPHTHALMIC SOLUTION    Place 1 drop into both eyes 2 (two) times daily.    FERROUS FUMARATE (HEMOCYTE - 106 MG FE) 325 (106 FE) MG TABS    Take 1 tablet by mouth 2 (two) times daily.   FUROSEMIDE (LASIX) 40 MG TABLET    Take 1 tablet (40 mg total) by mouth daily.   HYDROCODONE-ACETAMINOPHEN (NORCO/VICODIN) 5-325 MG PER TABLET    Take one tablet by mouth every four hours as needed for pain. Do not exceed 4grams of Tylenol in 24 hours   INSULIN DETEMIR (LEVEMIR) 100 UNIT/ML INJECTION    Inject 10 Units into the skin at bedtime.    INSULIN LISPRO (HUMALOG) 100 UNIT/ML INJECTION    Check blood sugar three times a day before meals with sliding scale 5 units for cbg 250-350 8 units for cbg >350-450 10 units for cbg>450   ISOSORBIDE MONONITRATE (IMDUR) 60 MG 24 HR TABLET    Take  90 mg by mouth daily. Pt takes 1and 1/2 of the 60 mg pill to equal 90 mg.   KLOR-CON M10 10 MEQ TABLET    TAKE 1 TABLET TWICE DAILY   LABETALOL (NORMODYNE) 200 MG TABLET    TAKE 1 TABLET TWICE A DAY   LEVOTHYROXINE (SYNTHROID, LEVOTHROID) 75 MCG TABLET    TAKE 1 TABLET EVERY DAY   LINAGLIPTIN (TRADJENTA) 5 MG TABS TABLET    Take 5 mg by mouth daily.   LISINOPRIL (PRINIVIL,ZESTRIL) 10 MG TABLET    Take 10 mg by mouth daily.   LORATADINE (CLARITIN) 10 MG TABLET    Take 10 mg by mouth daily.   LORAZEPAM (ATIVAN) 0.5 MG TABLET    Take one tablet by mouth at bedtime for  anxiety   OLOPATADINE HCL 0.2 % SOLN    Place 1 drop into both eyes 2 (two) times daily.   POLYETHYLENE GLYCOL (MIRALAX / GLYCOLAX) PACKET    Take 17 g by mouth daily.   PRAVASTATIN (PRAVACHOL) 40 MG TABLET    TAKE 1 TABLET EVERY DAY FOR CHOLESTEROL   TRAMADOL (ULTRAM) 50 MG TABLET    Take one tablet by mouth twice daily for pain  Modified Medications   Modified Medication Previous Medication   ACETAMINOPHEN (TYLENOL) 325 MG TABLET acetaminophen (TYLENOL) 325 MG tablet      Take 2 tablets (650 mg total) by mouth 3 (three) times daily.    Take 1-2 tablets (325-650 mg total) by mouth every 6 (six) hours as needed for moderate pain.  Discontinued Medications   UNABLE TO FIND    Med Name:pro- stat SF wild cherry. 51ml po daily.     Physical Exam: Filed Vitals:   06/25/15 1358  BP: 138/67  Pulse: 61  Temp: 98.1 F (36.7 C)  TempSrc: Oral  Resp: 20    Physical Exam  Constitutional: He is oriented to person, place, and time. He appears well-developed and well-nourished. No distress.  Frail elderly male  HENT:  Head: Normocephalic and atraumatic.  Mouth/Throat: Oropharynx is clear and moist. No oropharyngeal exudate.  Eyes: EOM are normal. Pupils are equal, round, and reactive to light. Right eye exhibits discharge. Left eye exhibits discharge. Right conjunctiva is injected. Left conjunctiva is injected.  Neck: Normal range  of motion. Neck supple.  Cardiovascular: Normal rate and normal heart sounds.  An irregularly irregular rhythm present.  Pulmonary/Chest: Effort normal and breath sounds normal.  Abdominal: Soft. Bowel sounds are normal.  Musculoskeletal: He exhibits no edema or tenderness.  Limited ROM to right hip, otherwise moves LE without difficulty or pain  Neurological: He is alert and oriented to person, place, and time.  Skin: Skin is warm and dry. He is not diaphoretic.  Psychiatric: He has a normal mood and affect. Cognition and memory are impaired.    Labs reviewed: Basic Metabolic Panel:  Recent Labs  04/07/15 1832 04/07/15 1853  04/08/15 0720 04/09/15 0620 04/22/15  NA 138 138  --  142 133* 140  K 4.1 4.0  --  3.9 4.5 4.2  CL 106 104  --  108 101  --   CO2 23  --   --  27 24  --   GLUCOSE 344* 347*  --  134* 253*  --   BUN 20 21  --  18 26* 19  CREATININE 1.15 1.10  < > 1.11 1.37* 1.0  CALCIUM 8.9  --   --  8.7 8.3*  --   < > = values in this interval not displayed. Liver Function Tests:  Recent Labs  08/27/14 1018 10/24/14 0845 04/22/15  AST 12 10 10*  ALT <8 <5 8*  ALKPHOS 80 68 116  BILITOT 1.2 0.9  --   PROT 6.1 5.9*  --   ALBUMIN 3.8 3.1*  --    No results for input(s): LIPASE, AMYLASE in the last 8760 hours. No results for input(s): AMMONIA in the last 8760 hours. CBC:  Recent Labs  08/27/14 1018 10/24/14 0845  04/07/15 1832  04/08/15 0720 04/09/15 0620 04/10/15 0524 04/22/15  WBC 8.5 8.2  < > 11.6*  < > 13.1* 11.4* 10.9*  --   NEUTROABS 7.0 6.3  --  9.6*  --   --   --   --   --  HGB 13.4 13.6  < > 12.7*  < > 11.4* 10.3* 10.3* 11.9*  HCT 40.0 42.0  < > 38.0*  < > 35.6* 32.1* 31.9* 37*  MCV 87.7 88.2  --  85.6  < > 87.9 89.7 89.9  --   PLT 177 140*  < > 113*  < > 120* 105* 118* 244  < > = values in this interval not displayed. TSH:  Recent Labs  08/27/14 1018 11/04/14 04/08/15 0100  TSH 1.064 0.85 0.985   A1C: Lab Results  Component Value Date    HGBA1C 8.0* 04/08/2015   Lipid Panel:  Recent Labs  08/27/14 1018 11/04/14  CHOL 126 116  HDL 50 49  LDLCALC 59 54  TRIG 87 66  CHOLHDL 2.5  --      Assessment/Plan 1. Bilateral conjunctivitis ciloxan ophthalmic 0.3% ointment, 0.5 inch ribbon into both eyd TID for 2 days then BID for 5 days  2. Right knee pain -reports this bothers him some but still able to walk, to use as needed tramadol if needed for pain, has tylenol scheduled.     Carlos American. Harle Battiest  [facility] [phone] 8 am - 5 pm) [phone] (after hours)

## 2015-07-10 ENCOUNTER — Non-Acute Institutional Stay (SKILLED_NURSING_FACILITY): Payer: Medicare Other | Admitting: Internal Medicine

## 2015-07-10 ENCOUNTER — Encounter: Payer: Self-pay | Admitting: Internal Medicine

## 2015-07-10 DIAGNOSIS — H6123 Impacted cerumen, bilateral: Secondary | ICD-10-CM

## 2015-07-10 DIAGNOSIS — H9202 Otalgia, left ear: Secondary | ICD-10-CM | POA: Diagnosis not present

## 2015-07-10 NOTE — Progress Notes (Signed)
Patient ID: Troy Warren, male   DOB: Nov 06, 1931, 81 y.o.   MRN: [id number]   [facility]  Code Status: Full Code    Chief Complaint  Patient presents with  . Acute Visit    Earache    Allergies  Allergen Reactions  . Lipitor [Atorvastatin]     weakness   HPI 81 y/o pt seen today with complaints of left ear ache on and off for 2 weeks. He complaints of the ear feeling stuffed up and is uncomfortable. He has noticed hearing loss.  ROS Denies fever or chills Denies runny nose/ nasal discharge/ sore throat or cough Denies nasal or chest congestion Denies chest pain or dyspnea No ear discharge  Past Medical History  Diagnosis Date  . Hypertension   . Ischemic heart disease   . Hyperlipemia   . Depression   . Testicular cancer   . Polio     child polio  . Rheumatic fever   . Mild dementia   . Mild dementia   . Systolic heart failure     acute on chronic  . CAD (coronary artery disease)   . Chronic atrial fibrillation   . Vitamin D deficiency   . Type II or unspecified type diabetes mellitus without mention of complication, not stated as uncontrolled   . Other testicular hypofunction   . Permanent atrial fibrillation 01/21/2014  . ICD, biventricular, in situ - Medtronic Protecta 2013, leads 2009 01/21/2014    New Generator Medtronic [place] model number [id number], serial number [id number]  Right atrial lead (chronic) Medtronic, model number [id number], serial [id number] (implanted 05/01/2008)  Right ventricular lead (chronic) Medtronic, model number [id number], serial number [id number] (implanted 05/01/2008)  Left ventricular lead Medtronic, model number [id number], serial number [id number] (implanted 05/01/2008)  Exp  . Pleural effusion, right 03/18/2014  . Emphysema 03/18/2014  . Pulmonary infiltrate 05/10/2014    CT chest 03/2014:  RUL interstitial/GGO        Medication List       This list is accurate as of: 07/10/15  2:28 PM.  Always use your most recent med  list.               aspirin 81 MG tablet  Take 81 mg by mouth daily.     bisacodyl 10 MG suppository  Commonly known as:  DULCOLAX  Place 1 suppository (10 mg total) rectally daily as needed for moderate constipation.     citalopram 20 MG tablet  Commonly known as:  CELEXA  Take 20 mg by mouth daily.     diltiazem 120 MG tablet  Commonly known as:  CARDIZEM  Take 1 tablet (120 mg total) by mouth 3 (three) times daily.     donepezil 23 MG Tabs tablet  Commonly known as:  ARICEPT  Take 1 tablet (23 mg total) by mouth at bedtime.     dorzolamide-timolol 22.3-6.8 MG/ML ophthalmic solution  Commonly known as:  COSOPT  Place 1 drop into both eyes 2 (two) times daily.     feeding supplement (PRO-STAT SUGAR FREE 64) Liqd  Take 30 mLs by mouth daily.     ferrous sulfate 324 (65 FE) MG Tbec  Take by mouth 2 (two) times daily.     furosemide 40 MG tablet  Commonly known as:  LASIX  Take 1 tablet (40 mg total) by mouth daily.     HYDROcodone-acetaminophen 5-325 MG per tablet  Commonly known as:  NORCO/VICODIN  Take one tablet by  mouth every four hours as needed for pain. Do not exceed 4grams of Tylenol in 24 hours     IMDUR 30 MG 24 hr tablet  Generic drug:  isosorbide mononitrate  Take 90 mg by mouth daily. For Hypertension     insulin detemir 100 UNIT/ML injection  Commonly known as:  LEVEMIR  Inject 10 Units into the skin at bedtime.     insulin lispro 100 UNIT/ML injection  Commonly known as:  HUMALOG  Check blood sugar three times a day before meals with sliding scale 5 units for cbg 250-350 8 units for cbg >350-450 10 units for cbg>450     KLOR-CON M10 10 MEQ tablet  Generic drug:  potassium chloride  TAKE 1 TABLET TWICE DAILY     labetalol 200 MG tablet  Commonly known as:  NORMODYNE  TAKE 1 TABLET TWICE A DAY     levothyroxine 75 MCG tablet  Commonly known as:  SYNTHROID, LEVOTHROID  TAKE 1 TABLET EVERY DAY     linagliptin 5 MG Tabs tablet  Commonly  known as:  TRADJENTA  Take 5 mg by mouth daily.     lisinopril 10 MG tablet  Commonly known as:  PRINIVIL,ZESTRIL  Take 10 mg by mouth daily.     loratadine 10 MG tablet  Commonly known as:  CLARITIN  Take 10 mg by mouth daily.     LORazepam 0.5 MG tablet  Commonly known as:  ATIVAN  Take one tablet by mouth at bedtime for anxiety     Olopatadine HCl 0.2 % Soln  Place 1 drop into both eyes 2 (two) times daily.     polyethylene glycol packet  Commonly known as:  MIRALAX / GLYCOLAX  Take 17 g by mouth daily.     pravastatin 40 MG tablet  Commonly known as:  PRAVACHOL  TAKE 1 TABLET EVERY DAY FOR CHOLESTEROL     traMADol 50 MG tablet  Commonly known as:  ULTRAM  Take one tablet by mouth twice daily for pain     TYLENOL 325 MG tablet  Generic drug:  acetaminophen  Take 650 mg by mouth 3 (three) times daily. Scheduled then 2 by mouth every 6 hours as needed for mild pain     VITAMIN B-12 PO  Take 1,000 mcg by mouth daily.     Vitamin D3 2000 UNITS Tabs  Take 2 tablets by mouth daily.        Physical exam BP 163/52 mmHg  Pulse 62  Temp(Src) 97.2 F (36.2 C) (Oral)  Resp 19  SpO2 95%  General: elderly male in no distress Head: Normocephalic and atraumatic.  Ears: both ear canal impacted with cerumen, cannot visualize tympanic membrane Mouth/Throat: Oropharynx is clear and moist. No oropharyngeal exudate.  Nose: no nasal discharge, no maxillary sinus tenderness Eyes: EOM are normal. Pupils are equal, round, and reactive to light.  Neck: Normal range of motion. Neck supple.  Cardiovascular: irregular heart rate, normal s1,s2 Pulmonary/Chest: Effort normal and breath sounds normal.  Abdominal: Soft. Bowel sounds are normal.  Skin: Skin is warm and dry. He is not diaphoretic.  Psychiatric: He has a normal mood and affect.    Assessment/plan  Earache From impacted cerumen. See below. No signs of infection at present  Impacted cerumen Will need debrox drops  to both ears bid x 5 days, followed by ear lavage   Blanchie Serve, MD  [place] (Monday-Friday 8 am - 5 pm) [phone] (afterhours)

## 2015-07-21 LAB — HEPATIC FUNCTION PANEL
ALT: 4 U/L — AB (ref 10–40)
AST: 8 U/L — AB (ref 14–40)
Alkaline Phosphatase: 83 U/L (ref 25–125)

## 2015-07-21 LAB — HEMOGLOBIN A1C: HEMOGLOBIN A1C: 6.8 % — AB (ref 4.0–6.0)

## 2015-07-21 LAB — CBC AND DIFFERENTIAL
HEMATOCRIT: 37 % — AB (ref 41–53)
HEMOGLOBIN: 12.4 g/dL — AB (ref 13.5–17.5)
Platelets: 150 10*3/uL (ref 150–399)
WBC: 8.3 10*3/mL

## 2015-07-21 LAB — BASIC METABOLIC PANEL
BUN: 16 mg/dL (ref 4–21)
Creatinine: 1 mg/dL (ref 0.6–1.3)
Glucose: 128 mg/dL
POTASSIUM: 3.6 mmol/L (ref 3.4–5.3)
Sodium: 142 mmol/L (ref 137–147)

## 2015-08-04 ENCOUNTER — Other Ambulatory Visit: Payer: Self-pay | Admitting: *Deleted

## 2015-08-04 MED ORDER — LORAZEPAM 0.5 MG PO TABS: ORAL_TABLET | ORAL | Status: DC

## 2015-08-04 NOTE — Telephone Encounter (Signed)
Milta Deiters medical Medtronic

## 2015-08-08 ENCOUNTER — Non-Acute Institutional Stay (SKILLED_NURSING_FACILITY): Payer: Medicare Other | Admitting: Nurse Practitioner

## 2015-08-08 DIAGNOSIS — I482 Chronic atrial fibrillation: Secondary | ICD-10-CM | POA: Diagnosis not present

## 2015-08-08 DIAGNOSIS — F0150 Vascular dementia without behavioral disturbance: Secondary | ICD-10-CM | POA: Diagnosis not present

## 2015-08-08 DIAGNOSIS — G309 Alzheimer's disease, unspecified: Secondary | ICD-10-CM | POA: Diagnosis not present

## 2015-08-08 DIAGNOSIS — E1129 Type 2 diabetes mellitus with other diabetic kidney complication: Secondary | ICD-10-CM | POA: Diagnosis not present

## 2015-08-08 DIAGNOSIS — N058 Unspecified nephritic syndrome with other morphologic changes: Secondary | ICD-10-CM | POA: Diagnosis not present

## 2015-08-08 DIAGNOSIS — N183 Chronic kidney disease, stage 3 unspecified: Secondary | ICD-10-CM

## 2015-08-08 DIAGNOSIS — I129 Hypertensive chronic kidney disease with stage 1 through stage 4 chronic kidney disease, or unspecified chronic kidney disease: Secondary | ICD-10-CM

## 2015-08-08 DIAGNOSIS — E1122 Type 2 diabetes mellitus with diabetic chronic kidney disease: Secondary | ICD-10-CM

## 2015-08-08 DIAGNOSIS — I5042 Chronic combined systolic (congestive) and diastolic (congestive) heart failure: Secondary | ICD-10-CM

## 2015-08-08 DIAGNOSIS — F0280 Dementia in other diseases classified elsewhere without behavioral disturbance: Secondary | ICD-10-CM

## 2015-08-08 DIAGNOSIS — I4821 Permanent atrial fibrillation: Secondary | ICD-10-CM

## 2015-08-08 NOTE — Progress Notes (Signed)
Patient ID: Troy Warren, male   DOB: [birth year], 81 y.o.   MRN: [id number]    Nursing Home Location:  [place] of Service: SNF (31)  PCP: Blanchie Serve, MD  Allergies  Allergen Reactions  . Lipitor [Atorvastatin]     weakness    Chief Complaint  Patient presents with  . Medical Management of Chronic Issues  . Acute Visit    weight gain and swelling to LE    HPI:  Patient is a 81 y.o. male seen today at [facility] and Rehab for routine follow up on chronic conditions, nursing also reports weight gain and swelling in LE. Pt with pmh of afib, HTN, hypothyroidism, depression w/ anxiety, mixed AD and vascular dementia, CHF, DM2.  Pt reports some discomfort to lower legs due to edema.staff has noted pt with a weight gain from 159 on 07/14/15 to 177 lbs on 8/25. pts diet has also recently been changed to puree and he is eating much better.  No recent falls.  Gets up and goes to bathroom without difficulty using walker. No recent falls Pt reports some shortness of breath with increase in activity otherwise breathing has been stable.  Bowels moving okay at this time. Reports some problems with this occasionally.  Mood has been good. No depression or anxiety.  Review of Systems:  Review of Systems  Constitutional: Negative for activity change, appetite change, fatigue and unexpected weight change.  HENT: Negative for congestion and hearing loss.   Eyes: Negative.   Respiratory: Negative for cough and shortness of breath.   Cardiovascular: Positive for leg swelling. Negative for chest pain and palpitations.  Gastrointestinal: Negative for abdominal pain, diarrhea and constipation.  Genitourinary: Negative for dysuria and difficulty urinating.  Musculoskeletal: Negative for myalgias and arthralgias.  Skin: Negative for color change and wound.  Neurological: Negative for dizziness and weakness.  Psychiatric/Behavioral: Positive for behavioral  problems and confusion. Negative for agitation.    Past Medical History  Diagnosis Date  . Hypertension   . Ischemic heart disease   . Hyperlipemia   . Depression   . Testicular cancer   . Polio     child polio  . Rheumatic fever   . Mild dementia   . Mild dementia   . Systolic heart failure     acute on chronic  . CAD (coronary artery disease)   . Chronic atrial fibrillation   . Vitamin D deficiency   . Type II or unspecified type diabetes mellitus without mention of complication, not stated as uncontrolled   . Other testicular hypofunction   . Permanent atrial fibrillation 01/21/2014  . ICD, biventricular, in situ - Medtronic Protecta 2013, leads 2009 01/21/2014    New Generator Medtronic [place] model number [id number], serial number [id number]  Right atrial lead (chronic) Medtronic, model number [id number], serial [id number] (implanted 05/01/2008)  Right ventricular lead (chronic) Medtronic, model number [id number], serial number [id number] (implanted 05/01/2008)  Left ventricular lead Medtronic, model number [id number], serial number [id number] (implanted 05/01/2008)  Exp  . Pleural effusion, right 03/18/2014  . Emphysema 03/18/2014  . Pulmonary infiltrate 05/10/2014    CT chest 03/2014:  RUL interstitial/GGO    Past Surgical History  Procedure Laterality Date  . Coronary artery bypass graft  06/26/2002    LIMA to diagonal artery & SVG to the first obtuse margin  . Nm myocar perf wall motion  09/15/2006    Dilated LV w/small area of possible nontransmural inferior  scar w/mild perinfarct ischemia.  No significant change from previous study.  . Cardiac defibrillator placement  05/01/2008    Medtronic  . Biv icd genertaor change out N/A 10/27/2012    Procedure: BIV ICD GENERTAOR CHANGE OUT;  Surgeon: Sanda Klein, MD;  Location: [facility] CATH LAB;  Service: Cardiovascular;  Laterality: N/A;  . Intramedullary (im) nail intertrochanteric Right 04/07/2015    Procedure: INTRAMEDULLARY (IM) NAIL INTERTROCHANTRIC;   Surgeon: Netta Cedars, MD;  Location: [place];  Service: Orthopedics;  Laterality: Right;   Social History:   reports that he quit smoking about 46 years ago. He has never used smokeless tobacco. He reports that he does not drink alcohol or use illicit drugs.  Family History  Problem Relation Age of Onset  . Other Mother   . Heart attack Father     Medications: Patient's Medications  New Prescriptions   No medications on file  Previous Medications   ACETAMINOPHEN (TYLENOL) 325 MG TABLET    Take 650 mg by mouth 3 (three) times daily. Scheduled then 2 by mouth every 6 hours as needed for mild pain   AMINO ACIDS-PROTEIN HYDROLYS (FEEDING SUPPLEMENT, PRO-STAT SUGAR FREE 64,) LIQD    Take 30 mLs by mouth daily.   ASPIRIN 81 MG TABLET    Take 81 mg by mouth daily.   BISACODYL (DULCOLAX) 10 MG SUPPOSITORY    Place 1 suppository (10 mg total) rectally daily as needed for moderate constipation.   CHOLECALCIFEROL (VITAMIN D3) 2000 UNITS TABS    Take 2 tablets by mouth daily.    CITALOPRAM (CELEXA) 20 MG TABLET    Take 20 mg by mouth daily.   CYANOCOBALAMIN (VITAMIN B-12 PO)    Take 1,000 mcg by mouth daily.    DILTIAZEM (CARDIZEM) 120 MG TABLET    Take 1 tablet (120 mg total) by mouth 3 (three) times daily.   DONEPEZIL (ARICEPT) 23 MG TABS TABLET    Take 1 tablet (23 mg total) by mouth at bedtime.   DORZOLAMIDE-TIMOLOL (COSOPT) 22.3-6.8 MG/ML OPHTHALMIC SOLUTION    Place 1 drop into both eyes 2 (two) times daily.    FERROUS SULFATE 324 (65 FE) MG TBEC    Take by mouth 2 (two) times daily.   FUROSEMIDE (LASIX) 40 MG TABLET    Take 1 tablet (40 mg total) by mouth daily.   HYDROCODONE-ACETAMINOPHEN (NORCO/VICODIN) 5-325 MG PER TABLET    Take one tablet by mouth every four hours as needed for pain. Do not exceed 4grams of Tylenol in 24 hours   INSULIN DETEMIR (LEVEMIR) 100 UNIT/ML INJECTION    Inject 10 Units into the skin at bedtime.    INSULIN LISPRO (HUMALOG) 100 UNIT/ML INJECTION    Check blood  sugar three times a day before meals with sliding scale 5 units for cbg 250-350 8 units for cbg >350-450 10 units for cbg>450   ISOSORBIDE MONONITRATE (IMDUR) 30 MG 24 HR TABLET    Take 90 mg by mouth daily. For Hypertension   KLOR-CON M10 10 MEQ TABLET    TAKE 1 TABLET TWICE DAILY   LABETALOL (NORMODYNE) 200 MG TABLET    TAKE 1 TABLET TWICE A DAY   LEVOTHYROXINE (SYNTHROID, LEVOTHROID) 75 MCG TABLET    TAKE 1 TABLET EVERY DAY   LINAGLIPTIN (TRADJENTA) 5 MG TABS TABLET    Take 5 mg by mouth daily.   LISINOPRIL (PRINIVIL,ZESTRIL) 10 MG TABLET    Take 10 mg by mouth daily.   LORATADINE (CLARITIN) 10 MG  TABLET    Take 10 mg by mouth daily.   LORAZEPAM (ATIVAN) 0.5 MG TABLET    Take one tablet by mouth at bedtime for anxiety   OLOPATADINE HCL 0.2 % SOLN    Place 1 drop into both eyes 2 (two) times daily.   POLYETHYLENE GLYCOL (MIRALAX / GLYCOLAX) PACKET    Take 17 g by mouth daily.   PRAVASTATIN (PRAVACHOL) 40 MG TABLET    TAKE 1 TABLET EVERY DAY FOR CHOLESTEROL   TRAMADOL (ULTRAM) 50 MG TABLET    Take one tablet by mouth twice daily for pain  Modified Medications   No medications on file  Discontinued Medications   No medications on file     Physical Exam: Filed Vitals:   08/08/15 1141  BP: 133/86  Pulse: 60  Temp: 97.9 F (36.6 C)  Resp: 20  Weight: 177 lb (80.287 kg)    Physical Exam  Constitutional: He is oriented to person, place, and time. He appears well-developed and well-nourished. No distress.  Frail elderly male  HENT:  Head: Normocephalic and atraumatic.  Mouth/Throat: Oropharynx is clear and moist. No oropharyngeal exudate.  Eyes: Conjunctivae and EOM are normal. Pupils are equal, round, and reactive to light.  Neck: Normal range of motion. Neck supple.  Cardiovascular: Normal rate and normal heart sounds.  An irregularly irregular rhythm present.  Pulmonary/Chest: Effort normal and breath sounds normal.  Abdominal: Soft. Bowel sounds are normal.    Musculoskeletal: He exhibits edema (2+ pitting edema bilaterally up into knee) and tenderness.  Neurological: He is alert and oriented to person, place, and time.  Skin: Skin is warm and dry. He is not diaphoretic.  Psychiatric: He has a normal mood and affect. Cognition and memory are impaired.    Labs reviewed: Basic Metabolic Panel:  Recent Labs  04/07/15 1832 04/07/15 1853  04/08/15 0720 04/09/15 0620 04/22/15 07/21/15  NA 138 138  --  142 133* 140 142  K 4.1 4.0  --  3.9 4.5 4.2 3.6  CL 106 104  --  108 101  --   --   CO2 23  --   --  27 24  --   --   GLUCOSE 344* 347*  --  134* 253*  --   --   BUN 20 21  --  18 26* 19 16  CREATININE 1.15 1.10  < > 1.11 1.37* 1.0 1.0  CALCIUM 8.9  --   --  8.7 8.3*  --   --   < > = values in this interval not displayed. Liver Function Tests:  Recent Labs  08/27/14 1018 10/24/14 0845 04/22/15 07/21/15  AST 12 10 10* 8*  ALT <8 <5 8* 4*  ALKPHOS 80 68 116 83  BILITOT 1.2 0.9  --   --   PROT 6.1 5.9*  --   --   ALBUMIN 3.8 3.1*  --   --    No results for input(s): LIPASE, AMYLASE in the last 8760 hours. No results for input(s): AMMONIA in the last 8760 hours. CBC:  Recent Labs  08/27/14 1018 10/24/14 0845  04/07/15 1832  04/08/15 0720 04/09/15 0620 04/10/15 0524 04/22/15 07/21/15  WBC 8.5 8.2  < > 11.6*  < > 13.1* 11.4* 10.9*  --  8.3  NEUTROABS 7.0 6.3  --  9.6*  --   --   --   --   --   --   HGB 13.4 13.6  < > 12.7*  < >  11.4* 10.3* 10.3* 11.9* 12.4*  HCT 40.0 42.0  < > 38.0*  < > 35.6* 32.1* 31.9* 37* 37*  MCV 87.7 88.2  --  85.6  < > 87.9 89.7 89.9  --   --   PLT 177 140*  < > 113*  < > 120* 105* 118* 244 150  < > = values in this interval not displayed. TSH:  Recent Labs  08/27/14 1018 11/04/14 04/08/15 0100  TSH 1.064 0.85 0.985   A1C: Lab Results  Component Value Date   HGBA1C 6.8* 07/21/2015   Lipid Panel:  Recent Labs  08/27/14 1018 11/04/14  CHOL 126 116  HDL 50 49  LDLCALC 59 54  TRIG 87 66   CHOLHDL 2.5  --      Assessment/Plan    1. Chronic combined systolic and diastolic CHF (congestive heart failure) Worsening LE edema with significant weight gain and some mention of shortness of breath, Echo 03/2014 showed EF 25-30% Continues on lisinopril 10mg  daily, labetalol 200mg  twice daily,  and imdur 90mg  daily.  -pt currently on lasix 40 mg daily with potassium supplement. Will increase lasix to 40 mg BID and increase potassium supplement to 20 meq BID then to resume previous dosing  -follow up BMP in 5 days -cont daily weights -staff to have pt elevate LE as tolerates -to use TED hose   2. Hypertension associated with stage 3 chronic kidney disease due to type 2 diabetes mellitus Blood pressure remains stable on current regimen -BMP up to date and stable  3. DM (diabetes mellitus) type II controlled with renal manifestation Recent A1c at goal at 6.8, will cont current medication regimen.   4. Permanent atrial fibrillation Rate controlled on current regimen, conts on ASA 81 mg daily  5. Mixed Alzheimer's and vascular dementia Dementia has been stable without acute decline. conts on Aricept daily   6. Chronic kidney disease, stage III (moderate) -BMP reviewed and Cr stable.    Carlos American. Harle Battiest  [facility] [phone] 8 am - 5 pm) [phone] (after hours)

## 2015-08-13 ENCOUNTER — Non-Acute Institutional Stay (SKILLED_NURSING_FACILITY): Payer: Medicare Other | Admitting: Nurse Practitioner

## 2015-08-13 DIAGNOSIS — I5042 Chronic combined systolic (congestive) and diastolic (congestive) heart failure: Secondary | ICD-10-CM

## 2015-08-13 DIAGNOSIS — R635 Abnormal weight gain: Secondary | ICD-10-CM | POA: Diagnosis not present

## 2015-08-13 DIAGNOSIS — R609 Edema, unspecified: Secondary | ICD-10-CM | POA: Diagnosis not present

## 2015-08-13 NOTE — Progress Notes (Signed)
Patient ID: Troy Warren, male   DOB: 1931/03/12, 81 y.o.   MRN: [id number]    Nursing Home Location:  [place] of Service: SNF (31)  PCP: Blanchie Serve, MD  Allergies  Allergen Reactions  . Lipitor [Atorvastatin]     weakness    No chief complaint on file.   HPI:  Patient is a 81 y.o. male seen today at [facility] and Rehab for follow up of his CHF and weight gain. Pt was seen last week and lasix and potassium increased.  Today he is feeling well, says that he is short of breath with exertion but this is unchanged.  He is up in the wheelchair, feeding himself.  He denies any shortness of breath or palpitations, no JVD noted.  His weight has been stable for the past 6 days after his furosemide was increased.  He has lost 1 pound since 8/26. Nursing without any acute concerns.  Discussed edema with treatment nurse and reports he does not keep on TED hose.  Review of Systems:  Review of Systems  Constitutional: Negative for activity change, appetite change, fatigue and unexpected weight change.  HENT: Negative for congestion and hearing loss.   Eyes: Negative.   Respiratory: Negative for cough and shortness of breath.   Cardiovascular: Positive for leg swelling. Negative for chest pain and palpitations.  Gastrointestinal: Negative for abdominal pain, diarrhea and constipation.  Genitourinary: Negative for dysuria, frequency and difficulty urinating.  Musculoskeletal: Negative for myalgias and arthralgias.  Skin: Negative for color change and wound.  Neurological: Negative for dizziness and weakness.  Psychiatric/Behavioral: Positive for confusion. Negative for agitation.    Past Medical History  Diagnosis Date  . Hypertension   . Ischemic heart disease   . Hyperlipemia   . Depression   . Testicular cancer   . Polio     child polio  . Rheumatic fever   . Mild dementia   . Mild dementia   . Systolic heart failure     acute on  chronic  . CAD (coronary artery disease)   . Chronic atrial fibrillation   . Vitamin D deficiency   . Type II or unspecified type diabetes mellitus without mention of complication, not stated as uncontrolled   . Other testicular hypofunction   . Permanent atrial fibrillation 01/21/2014  . ICD, biventricular, in situ - Medtronic Protecta 2013, leads 2009 01/21/2014    New Generator Medtronic [place] model number [id number], serial number [id number]  Right atrial lead (chronic) Medtronic, model number [id number], serial [id number] (implanted 05/01/2008)  Right ventricular lead (chronic) Medtronic, model number [id number], serial number [id number] (implanted 05/01/2008)  Left ventricular lead Medtronic, model number [id number], serial number [id number] (implanted 05/01/2008)  Exp  . Pleural effusion, right 03/18/2014  . Emphysema 03/18/2014  . Pulmonary infiltrate 05/10/2014    CT chest 03/2014:  RUL interstitial/GGO    Past Surgical History  Procedure Laterality Date  . Coronary artery bypass graft  06/26/2002    LIMA to diagonal artery & SVG to the first obtuse margin  . Nm myocar perf wall motion  09/15/2006    Dilated LV w/small area of possible nontransmural inferior scar w/mild perinfarct ischemia.  No significant change from previous study.  . Cardiac defibrillator placement  05/01/2008    Medtronic  . Biv icd genertaor change out N/A 10/27/2012    Procedure: BIV ICD GENERTAOR CHANGE OUT;  Surgeon: Sanda Klein, MD;  Location: [facility] CATH LAB;  Service: Cardiovascular;  Laterality: N/A;  . Intramedullary (im) nail intertrochanteric Right 04/07/2015    Procedure: INTRAMEDULLARY (IM) NAIL INTERTROCHANTRIC;  Surgeon: Netta Cedars, MD;  Location: [place];  Service: Orthopedics;  Laterality: Right;   Social History:   reports that he quit smoking about 46 years ago. He has never used smokeless tobacco. He reports that he does not drink alcohol or use illicit drugs.  Family History  Problem Relation Age of Onset  . Other  Mother   . Heart attack Father     Medications: Patient's Medications  New Prescriptions   No medications on file  Previous Medications   ACETAMINOPHEN (TYLENOL) 325 MG TABLET    Take 650 mg by mouth 3 (three) times daily. Scheduled then 2 by mouth every 6 hours as needed for mild pain   AMINO ACIDS-PROTEIN HYDROLYS (FEEDING SUPPLEMENT, PRO-STAT SUGAR FREE 64,) LIQD    Take 30 mLs by mouth daily.   ASPIRIN 81 MG TABLET    Take 81 mg by mouth daily.   BISACODYL (DULCOLAX) 10 MG SUPPOSITORY    Place 1 suppository (10 mg total) rectally daily as needed for moderate constipation.   CHOLECALCIFEROL (VITAMIN D3) 2000 UNITS TABS    Take 2 tablets by mouth daily.    CITALOPRAM (CELEXA) 20 MG TABLET    Take 20 mg by mouth daily.   CYANOCOBALAMIN (VITAMIN B-12 PO)    Take 1,000 mcg by mouth daily.    DILTIAZEM (CARDIZEM) 120 MG TABLET    Take 1 tablet (120 mg total) by mouth 3 (three) times daily.   DONEPEZIL (ARICEPT) 23 MG TABS TABLET    Take 1 tablet (23 mg total) by mouth at bedtime.   DORZOLAMIDE-TIMOLOL (COSOPT) 22.3-6.8 MG/ML OPHTHALMIC SOLUTION    Place 1 drop into both eyes 2 (two) times daily.    FERROUS SULFATE 324 (65 FE) MG TBEC    Take by mouth 2 (two) times daily.   FUROSEMIDE (LASIX) 40 MG TABLET    Take 1 tablet (40 mg total) by mouth daily.   HYDROCODONE-ACETAMINOPHEN (NORCO/VICODIN) 5-325 MG PER TABLET    Take one tablet by mouth every four hours as needed for pain. Do not exceed 4grams of Tylenol in 24 hours   INSULIN DETEMIR (LEVEMIR) 100 UNIT/ML INJECTION    Inject 10 Units into the skin at bedtime.    INSULIN LISPRO (HUMALOG) 100 UNIT/ML INJECTION    Check blood sugar three times a day before meals with sliding scale 5 units for cbg 250-350 8 units for cbg >350-450 10 units for cbg>450   ISOSORBIDE MONONITRATE (IMDUR) 30 MG 24 HR TABLET    Take 90 mg by mouth daily. For Hypertension   KLOR-CON M10 10 MEQ TABLET    TAKE 1 TABLET TWICE DAILY   LABETALOL (NORMODYNE) 200 MG  TABLET    TAKE 1 TABLET TWICE A DAY   LEVOTHYROXINE (SYNTHROID, LEVOTHROID) 75 MCG TABLET    TAKE 1 TABLET EVERY DAY   LINAGLIPTIN (TRADJENTA) 5 MG TABS TABLET    Take 5 mg by mouth daily.   LISINOPRIL (PRINIVIL,ZESTRIL) 10 MG TABLET    Take 10 mg by mouth daily.   LORATADINE (CLARITIN) 10 MG TABLET    Take 10 mg by mouth daily.   LORAZEPAM (ATIVAN) 0.5 MG TABLET    Take one tablet by mouth at bedtime for anxiety   OLOPATADINE HCL 0.2 % SOLN    Place 1 drop into both eyes 2 (two) times daily.  POLYETHYLENE GLYCOL (MIRALAX / GLYCOLAX) PACKET    Take 17 g by mouth daily.   PRAVASTATIN (PRAVACHOL) 40 MG TABLET    TAKE 1 TABLET EVERY DAY FOR CHOLESTEROL   TRAMADOL (ULTRAM) 50 MG TABLET    Take one tablet by mouth twice daily for pain  Modified Medications   No medications on file  Discontinued Medications   No medications on file     Physical Exam: Filed Vitals:   08/13/15 1052  BP: 133/86  Pulse: 68  Temp: 97.9 F (36.6 C)  TempSrc: Oral  Resp: 19  Weight: 183 lb 4 oz (83.122 kg)    Physical Exam  Constitutional: He is oriented to person, place, and time. He appears well-developed. No distress.  Frail elderly male  HENT:  Head: Normocephalic and atraumatic.  Mouth/Throat: Oropharynx is clear and moist. No oropharyngeal exudate.  Eyes: Conjunctivae and EOM are normal. Pupils are equal, round, and reactive to light.  Neck: Normal range of motion. Neck supple.  Cardiovascular: Normal rate and normal heart sounds.  An irregularly irregular rhythm present.  Pulmonary/Chest: Effort normal and breath sounds normal.  Abdominal: Soft. Bowel sounds are normal.  Musculoskeletal: He exhibits edema (3+ pitting edema bilateral tibias. BLE weeping ) and tenderness.  Neurological: He is alert and oriented to person, place, and time.  Skin: Skin is warm and dry. He is not diaphoretic.  Large fluid filled ulcer on right lower extremity on tibia.    Psychiatric: He has a normal mood and  affect. Cognition and memory are impaired.    Labs reviewed: Basic Metabolic Panel:  Recent Labs  04/07/15 1832 04/07/15 1853  04/08/15 0720 04/09/15 0620 04/22/15 07/21/15  NA 138 138  --  142 133* 140 142  K 4.1 4.0  --  3.9 4.5 4.2 3.6  CL 106 104  --  108 101  --   --   CO2 23  --   --  27 24  --   --   GLUCOSE 344* 347*  --  134* 253*  --   --   BUN 20 21  --  18 26* 19 16  CREATININE 1.15 1.10  < > 1.11 1.37* 1.0 1.0  CALCIUM 8.9  --   --  8.7 8.3*  --   --   < > = values in this interval not displayed. Liver Function Tests:  Recent Labs  08/27/14 1018 10/24/14 0845 04/22/15 07/21/15  AST 12 10 10* 8*  ALT <8 <5 8* 4*  ALKPHOS 80 68 116 83  BILITOT 1.2 0.9  --   --   PROT 6.1 5.9*  --   --   ALBUMIN 3.8 3.1*  --   --    No results for input(s): LIPASE, AMYLASE in the last 8760 hours. No results for input(s): AMMONIA in the last 8760 hours. CBC:  Recent Labs  08/27/14 1018 10/24/14 0845  04/07/15 1832  04/08/15 0720 04/09/15 0620 04/10/15 0524 04/22/15 07/21/15  WBC 8.5 8.2  < > 11.6*  < > 13.1* 11.4* 10.9*  --  8.3  NEUTROABS 7.0 6.3  --  9.6*  --   --   --   --   --   --   HGB 13.4 13.6  < > 12.7*  < > 11.4* 10.3* 10.3* 11.9* 12.4*  HCT 40.0 42.0  < > 38.0*  < > 35.6* 32.1* 31.9* 37* 37*  MCV 87.7 88.2  --  85.6  < > 87.9  89.7 89.9  --   --   PLT 177 140*  < > 113*  < > 120* 105* 118* 244 150  < > = values in this interval not displayed. TSH:  Recent Labs  08/27/14 1018 11/04/14 04/08/15 0100  TSH 1.064 0.85 0.985   A1C: Lab Results  Component Value Date   HGBA1C 6.8* 07/21/2015   Lipid Panel:  Recent Labs  08/27/14 1018 11/04/14  CHOL 126 116  HDL 50 49  LDLCALC 59 54  TRIG 87 66  CHOLHDL 2.5  --      Assessment/Plan    1. Chronic combined systolic and diastolic CHF (congestive heart failure) - Denies shortness of breath.  Weight stable.  Continue furosemide 40mg  po bid dose for an additional 5 days, then return to 40mg  po qd  dose.   - Continue daily weights - Potassium 60mEq po daily for 5 days.  Then resume 65mEq daily dose. - BMP pending for today.  Will repeat BMP and BNP on Monday 9/5.   - Bilateral lower extremity edema with ulcer on right tibia.  Need legs wrapped by wound care. Nursing to elevate legs while patient is in bed.    2. Weight gain - Continue daily weights.  - Restrict fluids to 1517ml per day.    3. LE edema   - Obtain ABI's. - If normal, apply bilateral lower extremities with kerlex and coban per treatment nurse. To changes weekly and PRN for edema  - Patient needs protection for skin breakdown and infection.   Carlos American. Harle Battiest  [facility] [phone] 8 am - 5 pm) [phone] (after hours)

## 2015-08-19 ENCOUNTER — Inpatient Hospital Stay (HOSPITAL_COMMUNITY)
Admission: AD | Admit: 2015-08-19 | Discharge: 2015-08-25 | DRG: 293 | Disposition: A | Discharge status: Skilled Nursing Facility | Payer: Medicare Other | Source: Ambulatory Visit | Attending: Cardiovascular Disease | Admitting: Cardiovascular Disease

## 2015-08-19 ENCOUNTER — Emergency Department (HOSPITAL_COMMUNITY)
Admission: EM | Admit: 2015-08-19 | Discharge: 2015-08-19 | Disposition: A | Discharge status: Home / Self Care | Payer: Medicare Other | Source: Home / Self Care

## 2015-08-19 ENCOUNTER — Encounter: Payer: Self-pay | Admitting: Cardiovascular Disease

## 2015-08-19 ENCOUNTER — Ambulatory Visit (INDEPENDENT_AMBULATORY_CARE_PROVIDER_SITE_OTHER): Payer: Medicare Other | Admitting: Cardiovascular Disease

## 2015-08-19 VITALS — BP 169/81 | HR 70 | Resp 22 | Ht 67.0 in | Wt 184.9 lb

## 2015-08-19 DIAGNOSIS — Z7982 Long term (current) use of aspirin: Secondary | ICD-10-CM

## 2015-08-19 DIAGNOSIS — Z951 Presence of aortocoronary bypass graft: Secondary | ICD-10-CM

## 2015-08-19 DIAGNOSIS — E876 Hypokalemia: Secondary | ICD-10-CM | POA: Diagnosis present

## 2015-08-19 DIAGNOSIS — Z794 Long term (current) use of insulin: Secondary | ICD-10-CM | POA: Diagnosis not present

## 2015-08-19 DIAGNOSIS — I4821 Permanent atrial fibrillation: Secondary | ICD-10-CM | POA: Diagnosis present

## 2015-08-19 DIAGNOSIS — Z79899 Other long term (current) drug therapy: Secondary | ICD-10-CM | POA: Diagnosis not present

## 2015-08-19 DIAGNOSIS — E11649 Type 2 diabetes mellitus with hypoglycemia without coma: Secondary | ICD-10-CM | POA: Diagnosis not present

## 2015-08-19 DIAGNOSIS — I10 Essential (primary) hypertension: Secondary | ICD-10-CM | POA: Diagnosis present

## 2015-08-19 DIAGNOSIS — Z8547 Personal history of malignant neoplasm of testis: Secondary | ICD-10-CM

## 2015-08-19 DIAGNOSIS — I25708 Atherosclerosis of coronary artery bypass graft(s), unspecified, with other forms of angina pectoris: Secondary | ICD-10-CM | POA: Diagnosis not present

## 2015-08-19 DIAGNOSIS — I5042 Chronic combined systolic (congestive) and diastolic (congestive) heart failure: Secondary | ICD-10-CM

## 2015-08-19 DIAGNOSIS — E559 Vitamin D deficiency, unspecified: Secondary | ICD-10-CM | POA: Diagnosis present

## 2015-08-19 DIAGNOSIS — I482 Chronic atrial fibrillation: Secondary | ICD-10-CM

## 2015-08-19 DIAGNOSIS — F0280 Dementia in other diseases classified elsewhere without behavioral disturbance: Secondary | ICD-10-CM | POA: Diagnosis present

## 2015-08-19 DIAGNOSIS — F0390 Unspecified dementia without behavioral disturbance: Secondary | ICD-10-CM | POA: Diagnosis not present

## 2015-08-19 DIAGNOSIS — I5043 Acute on chronic combined systolic (congestive) and diastolic (congestive) heart failure: Secondary | ICD-10-CM | POA: Diagnosis present

## 2015-08-19 DIAGNOSIS — E1122 Type 2 diabetes mellitus with diabetic chronic kidney disease: Secondary | ICD-10-CM | POA: Diagnosis present

## 2015-08-19 DIAGNOSIS — I5023 Acute on chronic systolic (congestive) heart failure: Secondary | ICD-10-CM

## 2015-08-19 DIAGNOSIS — G309 Alzheimer's disease, unspecified: Secondary | ICD-10-CM | POA: Diagnosis present

## 2015-08-19 DIAGNOSIS — I2510 Atherosclerotic heart disease of native coronary artery without angina pectoris: Secondary | ICD-10-CM | POA: Diagnosis present

## 2015-08-19 DIAGNOSIS — I481 Persistent atrial fibrillation: Secondary | ICD-10-CM | POA: Diagnosis not present

## 2015-08-19 DIAGNOSIS — Z95810 Presence of automatic (implantable) cardiac defibrillator: Secondary | ICD-10-CM | POA: Diagnosis not present

## 2015-08-19 DIAGNOSIS — N183 Chronic kidney disease, stage 3 (moderate): Secondary | ICD-10-CM | POA: Diagnosis present

## 2015-08-19 DIAGNOSIS — E1129 Type 2 diabetes mellitus with other diabetic kidney complication: Secondary | ICD-10-CM | POA: Diagnosis present

## 2015-08-19 DIAGNOSIS — F0150 Vascular dementia without behavioral disturbance: Secondary | ICD-10-CM | POA: Diagnosis present

## 2015-08-19 DIAGNOSIS — I129 Hypertensive chronic kidney disease with stage 1 through stage 4 chronic kidney disease, or unspecified chronic kidney disease: Secondary | ICD-10-CM | POA: Diagnosis present

## 2015-08-19 LAB — COMPREHENSIVE METABOLIC PANEL
ALBUMIN: 3.1 g/dL — AB (ref 3.5–5.0)
ALT: 7 U/L — ABNORMAL LOW (ref 17–63)
AST: 15 U/L (ref 15–41)
Alkaline Phosphatase: 96 U/L (ref 38–126)
Anion gap: 10 (ref 5–15)
BILIRUBIN TOTAL: 0.9 mg/dL (ref 0.3–1.2)
BUN: 12 mg/dL (ref 6–20)
CO2: 29 mmol/L (ref 22–32)
Calcium: 8.8 mg/dL — ABNORMAL LOW (ref 8.9–10.3)
Chloride: 100 mmol/L — ABNORMAL LOW (ref 101–111)
Creatinine, Ser: 1.16 mg/dL (ref 0.61–1.24)
GFR calc Af Amer: >60 mL/min (ref >60)
GFR calc non Af Amer: 57 mL/min — ABNORMAL LOW (ref >60)
GLUCOSE: 163 mg/dL — AB (ref 65–99)
POTASSIUM: 3.7 mmol/L (ref 3.5–5.1)
SODIUM: 139 mmol/L (ref 135–145)
TOTAL PROTEIN: 6.2 g/dL — AB (ref 6.5–8.1)

## 2015-08-19 LAB — CBC
HEMATOCRIT: 41.9 % (ref 39.0–52.0)
HEMOGLOBIN: 13 g/dL (ref 13.0–17.0)
MCH: 28.8 pg (ref 26.0–34.0)
MCHC: 31 g/dL (ref 30.0–36.0)
MCV: 92.7 fL (ref 78.0–100.0)
Platelets: 149 10*3/uL — ABNORMAL LOW (ref 150–400)
RBC: 4.52 MIL/uL (ref 4.22–5.81)
RDW: 16.6 % — AB (ref 11.5–15.5)
WBC: 7.7 10*3/uL (ref 4.0–10.5)

## 2015-08-19 LAB — GLUCOSE, CAPILLARY: Glucose-Capillary: 173 mg/dL — ABNORMAL HIGH (ref 65–99)

## 2015-08-19 MED ORDER — FUROSEMIDE 10 MG/ML IJ SOLN
40.0000 mg | Freq: Two times a day (BID) | INTRAMUSCULAR | Status: DC
  Administered 2015-08-19 – 2015-08-23 (×8): 40 mg via INTRAVENOUS
  Filled 2015-08-19 (×8): qty 4

## 2015-08-19 MED ORDER — ASPIRIN EC 81 MG PO TBEC
81.0000 mg | DELAYED_RELEASE_TABLET | Freq: Every day | ORAL | Status: DC
  Administered 2015-08-20 – 2015-08-25 (×6): 81 mg via ORAL
  Filled 2015-08-19 (×6): qty 1

## 2015-08-19 MED ORDER — POTASSIUM CHLORIDE CRYS ER 10 MEQ PO TBCR
10.0000 meq | EXTENDED_RELEASE_TABLET | Freq: Two times a day (BID) | ORAL | Status: DC
  Administered 2015-08-19 – 2015-08-21 (×4): 10 meq via ORAL
  Filled 2015-08-19 (×4): qty 1

## 2015-08-19 MED ORDER — METOPROLOL TARTRATE 100 MG PO TABS
100.0000 mg | ORAL_TABLET | Freq: Two times a day (BID) | ORAL | Status: DC
  Administered 2015-08-19 – 2015-08-21 (×3): 100 mg via ORAL
  Filled 2015-08-19 (×4): qty 1

## 2015-08-19 MED ORDER — LINAGLIPTIN 5 MG PO TABS
5.0000 mg | ORAL_TABLET | Freq: Every day | ORAL | Status: DC
  Filled 2015-08-19 (×2): qty 1

## 2015-08-19 MED ORDER — FERROUS SULFATE 324 (65 FE) MG PO TBEC: 324.0000 mg | DELAYED_RELEASE_TABLET | Freq: Two times a day (BID) | ORAL | Status: DC

## 2015-08-19 MED ORDER — INSULIN DETEMIR 100 UNIT/ML ~~LOC~~ SOLN
10.0000 [IU] | Freq: Every day | SUBCUTANEOUS | Status: DC
  Administered 2015-08-19 – 2015-08-20 (×2): 10 [IU] via SUBCUTANEOUS
  Filled 2015-08-19 (×3): qty 0.1

## 2015-08-19 MED ORDER — ISOSORBIDE MONONITRATE ER 60 MG PO TB24
90.0000 mg | ORAL_TABLET | Freq: Every day | ORAL | Status: DC
  Administered 2015-08-20 – 2015-08-25 (×6): 90 mg via ORAL
  Filled 2015-08-19 (×12): qty 1

## 2015-08-19 MED ORDER — CITALOPRAM HYDROBROMIDE 20 MG PO TABS
20.0000 mg | ORAL_TABLET | Freq: Every day | ORAL | Status: DC
  Administered 2015-08-20 – 2015-08-25 (×6): 20 mg via ORAL
  Filled 2015-08-19 (×6): qty 1

## 2015-08-19 MED ORDER — ASPIRIN 81 MG PO TABS: 81.0000 mg | ORAL_TABLET | Freq: Every day | ORAL | Status: DC

## 2015-08-19 MED ORDER — ACETAMINOPHEN 325 MG PO TABS
650.0000 mg | ORAL_TABLET | Freq: Three times a day (TID) | ORAL | Status: DC
  Administered 2015-08-19 – 2015-08-25 (×16): 650 mg via ORAL
  Filled 2015-08-19 (×17): qty 2

## 2015-08-19 MED ORDER — LEVOTHYROXINE SODIUM 75 MCG PO TABS
75.0000 ug | ORAL_TABLET | Freq: Every day | ORAL | Status: DC
  Administered 2015-08-20 – 2015-08-25 (×6): 75 ug via ORAL
  Filled 2015-08-19 (×7): qty 1

## 2015-08-19 MED ORDER — DORZOLAMIDE HCL-TIMOLOL MAL 2-0.5 % OP SOLN
1.0000 [drp] | Freq: Two times a day (BID) | OPHTHALMIC | Status: DC
  Administered 2015-08-19 – 2015-08-25 (×11): 1 [drp] via OPHTHALMIC
  Filled 2015-08-19: qty 10

## 2015-08-19 MED ORDER — LISINOPRIL 10 MG PO TABS
10.0000 mg | ORAL_TABLET | Freq: Every day | ORAL | Status: DC
  Administered 2015-08-20 – 2015-08-21 (×2): 10 mg via ORAL
  Filled 2015-08-19 (×3): qty 1

## 2015-08-19 MED ORDER — DONEPEZIL HCL 23 MG PO TABS
23.0000 mg | ORAL_TABLET | Freq: Every day | ORAL | Status: DC
  Administered 2015-08-19 – 2015-08-24 (×6): 23 mg via ORAL
  Filled 2015-08-19 (×7): qty 1

## 2015-08-19 MED ORDER — PRAVASTATIN SODIUM 40 MG PO TABS
40.0000 mg | ORAL_TABLET | Freq: Every day | ORAL | Status: DC
  Administered 2015-08-19 – 2015-08-24 (×6): 40 mg via ORAL
  Filled 2015-08-19 (×6): qty 1

## 2015-08-19 MED ORDER — FERROUS SULFATE 325 (65 FE) MG PO TABS
325.0000 mg | ORAL_TABLET | Freq: Two times a day (BID) | ORAL | Status: DC
  Administered 2015-08-19 – 2015-08-25 (×12): 325 mg via ORAL
  Filled 2015-08-19 (×12): qty 1

## 2015-08-19 NOTE — H&P (Signed)
Date: 08/19/2015   ID: Troy Warren, DOB 10-21-1931, MRN [id number]  PCP: Blanchie Serve, MD Cardiologist: Sanda Klein, MD   Chief Complaint  Patient presents with  . Annual Exam    No complaints of chest pain,SOB or dizziness. Swelling of both legs for about a month.      History of Present Illness: Troy Warren is a 81 y.o. male who presents for Follow-up 4 stage the combined systolic and diastolic heart failure secondary to ischemic cardiomyopathy , with a background of permanent atrial fibrillation and CRT-D Device therapy.  This is his first office visit since February 2015 and his first pacemaker check since May 2016. He has progressive dementia and is a permanent nursing home resident. He is accompanied today by his wife.  He has had steady functional decline as well as cognitive decline over the last months , but over the last 1 month he has developed massive swelling of his lower extremities. He denies problems with chest pain and shortness of breath although he is breathing a little fast in the office today. He has not had syncope or palpitations and there have been no defibrillator discharges. He has anasarca on exam.  His weight has increased 28 pounds since July.  Interrogation of his device today shows that the efficiency of biventricular pacing is down to only 70% (as opposed to 88% last year). This is due to increased ventricular rates during permanent atrial fibrillation. In parallel, his thoracic impedance has shown drastic increase starting around June 1 , consistent with deteriorating volume status. There have been no episodes of sustained or nonsustained ventricular tachycardia. His average ventricular rate appears to be in the 80s and his nocturnal heart rate seems to be faster than during the day or at activity level is extremely poor.  His echocardiogram shows atrial fibrillation and there are frequent native AV conducted  beats with triggered pacing , fusion and pseudofusion. This suggests that the actual true percentage of biventricular pacing may be a lot lower. On the current tracing it appears to be around 50%.  He has a history of left temporal intracerebral hemorrhage in 2007 while on treatment with warfarin. He has never had a stroke or transient ischemic attack. He is not receiving anticoagulation but takes warfarin for stroke prevention.  He has an extensive history of coronary artery disease and previous bypass surgery. His last coronary angiogram was performed in 2007 and showed that the internal mammary artery bypass to the LAD and the saphenous vein graft bypass to the ramus intermedius artery are both patent , as was his right coronary artery.  His initial biventricular defibrillator was implanted in 2009 with a generator change out in 2013. The current device is a Medtronic protecta. Lead parameters are good, but as mentioned the percentage of biventricular pacing is poor.   He had severely depressed left ventricular systolic function that apparently improved back to normal range following implementation of CRT. Repeat assessment with echocardiography during an admission for pneumonia in April 2015 showed a severely dilated left ventricle , severely depressed ejection fraction 25-30 % and there was evidence of restrictive filling. Later that same month he had a hip fracture And tolerated surgery without major complications. Repeat evaluation for his cardiomyopathy has not been performed in part due to the progressive cognitive decline due to combined Alzheimer's and vascular dementia  Past Medical History  Diagnosis Date  . Hypertension   . Ischemic heart disease   . Hyperlipemia   . Depression   .  Testicular cancer   . Polio     child polio  . Rheumatic fever   . Mild dementia   . Mild dementia   . Systolic heart failure     acute on chronic  .  CAD (coronary artery disease)   . Chronic atrial fibrillation   . Vitamin D deficiency   . Type II or unspecified type diabetes mellitus without mention of complication, not stated as uncontrolled   . Other testicular hypofunction   . Permanent atrial fibrillation 01/21/2014  . ICD, biventricular, in situ - Medtronic Protecta 2013, leads 2009 01/21/2014    New Generator Medtronic Shipman model number [id number], serial number [id number] Right atrial lead (chronic) Medtronic, model number [id number], serial [id number] (implanted 05/01/2008) Right ventricular lead (chronic) Medtronic, model number [id number], serial number [id number] (implanted 05/01/2008) Left ventricular lead Medtronic, model number [id number], serial number [id number] (implanted 05/01/2008) Exp  . Pleural effusion, right 03/18/2014  . Emphysema 03/18/2014  . Pulmonary infiltrate 05/10/2014    CT chest 03/2014: RUL interstitial/GGO     Past Surgical History  Procedure Laterality Date  . Coronary artery bypass graft  06/26/2002    LIMA to diagonal artery & SVG to the first obtuse margin  . Nm myocar perf wall motion  09/15/2006    Dilated LV w/small area of possible nontransmural inferior scar w/mild perinfarct ischemia. No significant change from previous study.  . Cardiac defibrillator placement  05/01/2008    Medtronic  . Biv icd genertaor change out N/A 10/27/2012    Procedure: BIV ICD GENERTAOR CHANGE OUT; Surgeon: Sanda Klein, MD; Location[facility] CATH LAB; Service: Cardiovascular; Laterality: N/A;  . Intramedullary (im) nail intertrochanteric Right 04/07/2015    Procedure: INTRAMEDULLARY (IM) NAIL INTERTROCHANTRIC; Surgeon: Netta Cedars, MD; Location: [place]; Service: Orthopedics; Laterality: Right;     Current Outpatient Prescriptions  Medication Sig Dispense Refill  . acetaminophen (TYLENOL) 325 MG tablet Take 650 mg by mouth 3 (three) times  daily. Scheduled then 2 by mouth every 6 hours as needed for mild pain 60 tablet 1  . Amino Acids-Protein Hydrolys (FEEDING SUPPLEMENT, PRO-STAT SUGAR FREE 64,) LIQD Take 30 mLs by mouth daily.    Marland Kitchen aspirin 81 MG tablet Take 81 mg by mouth daily.    . bisacodyl (DULCOLAX) 10 MG suppository Place 1 suppository (10 mg total) rectally daily as needed for moderate constipation. 12 suppository 0  . Cholecalciferol (VITAMIN D3) 2000 UNITS TABS Take 2 tablets by mouth daily.     . citalopram (CELEXA) 20 MG tablet Take 20 mg by mouth daily.    . Cyanocobalamin (VITAMIN B-12 PO) Take 1,000 mcg by mouth daily.     Marland Kitchen diltiazem (CARDIZEM) 120 MG tablet Take 1 tablet (120 mg total) by mouth 3 (three) times daily. 270 tablet 3  . donepezil (ARICEPT) 23 MG TABS tablet Take 1 tablet (23 mg total) by mouth at bedtime. 60 tablet 1  . dorzolamide-timolol (COSOPT) 22.3-6.8 MG/ML ophthalmic solution Place 1 drop into both eyes 2 (two) times daily.     . ferrous sulfate 324 (65 FE) MG TBEC Take by mouth 2 (two) times daily.    . furosemide (LASIX) 40 MG tablet Take 1 tablet (40 mg total) by mouth daily. 30 tablet 9  . HYDROcodone-acetaminophen (NORCO/VICODIN) 5-325 MG per tablet Take one tablet by mouth every four hours as needed for pain. Do not exceed 4grams of Tylenol in 24 hours 180 tablet 0  . insulin detemir (LEVEMIR) 100 UNIT/ML injection Inject 10 Units  into the skin at bedtime.     . insulin lispro (HUMALOG) 100 UNIT/ML injection Check blood sugar three times a day before meals with sliding scale 5 units for cbg 250-350 8 units for cbg >350-450 10 units for cbg>450    . isosorbide mononitrate (IMDUR) 30 MG 24 hr tablet Take 90 mg by mouth daily. For Hypertension    . KLOR-CON M10 10 MEQ tablet TAKE 1 TABLET TWICE DAILY 180 tablet 0  . labetalol (NORMODYNE) 200 MG tablet TAKE 1 TABLET TWICE A DAY 180 tablet 99  .  levothyroxine (SYNTHROID, LEVOTHROID) 75 MCG tablet TAKE 1 TABLET EVERY DAY 90 tablet 3  . linagliptin (TRADJENTA) 5 MG TABS tablet Take 5 mg by mouth daily.    Marland Kitchen lisinopril (PRINIVIL,ZESTRIL) 10 MG tablet Take 10 mg by mouth daily.    Marland Kitchen loratadine (CLARITIN) 10 MG tablet Take 10 mg by mouth daily.    Marland Kitchen LORazepam (ATIVAN) 0.5 MG tablet Take one tablet by mouth at bedtime for anxiety 30 tablet 5  . Olopatadine HCl 0.2 % SOLN Place 1 drop into both eyes 2 (two) times daily.    . polyethylene glycol (MIRALAX / GLYCOLAX) packet Take 17 g by mouth daily.    . pravastatin (PRAVACHOL) 40 MG tablet TAKE 1 TABLET EVERY DAY FOR CHOLESTEROL 90 tablet 3  . traMADol (ULTRAM) 50 MG tablet Take one tablet by mouth twice daily for pain 60 tablet 5   No current facility-administered medications for this visit.    Allergies: Lipitor    Social History: The patient  reports that he quit smoking about 46 years ago. He has never used smokeless tobacco. He reports that he does not drink alcohol or use illicit drugs.   Family History: The patient's family history includes Heart attack in his father; Other in his mother.    ROS: Please see the history of present illness.  Otherwise, review of systems positive for none. He is really unable to answer most of the questions. His wife states that he has not had much in the way of complaints. All other systems are reviewed and negative.    PHYSICAL EXAM: VS: BP 169/81 mmHg  Pulse 70  Resp 22  Ht 5\' 7"  (1.702 m)  Wt 184 lb 14.4 oz (83.87 kg)  BMI 28.95 kg/m2 , BMI Body mass index is 28.95 kg/(m^2).  General: Alert, oriented To self, circumstance, not to date or time, no distress Head: no evidence of trauma, PERRL, EOMI, no exophtalmos or lid lag, no myxedema, no xanthelasma; normal ears, nose and oropharynx Neck: 10-12 cm jugular venous pulsations and no hepatojugular reflux; brisk carotid pulses without  delay and no carotid bruits Chest: clear to auscultation, no signs of consolidation by percussion or palpation, normal fremitus, symmetrical and full respiratory excursions, Healthy appearing left subclavian defibrillator site Cardiovascular: Lateral displacement and effacement of the apical impulse, regular rhythm, normal first and second heart sounds, Grade 2/6 early peaking aortic ejection murmur , radiating towards the carotids,no diastolic murmurs, rubs or gallops Abdomen: no tenderness or distention, no masses by palpation, no abnormal pulsatility or arterial bruits, normal bowel sounds, no hepatosplenomegaly Extremities: no clubbing, cyanosis; 3-4+ Hard pitting edema from the feet to the groin bilaterally and presacral edema; 2+ radial, ulnar and brachial pulses bilaterally; Edema prevents palpation of pulses in the lower extremities; no subclavian or femoral bruits Neurological: grossly nonfocal Psych: euthymic mood, full affect   EKG: EKG is ordered today. The ekg ordered today demonstrates Atrial  fibrillation , roughly 50% native A-V conduction and 50% ventricular paced beats with triggered pacing and pseudofusion   Recent Labs: 10/24/2014: Pro B Natriuretic peptide (BNP) 2991.0* 04/08/2015: TSH 0.985 07/21/2015: ALT 4*; BUN 16; Creatinine 1.0; Hemoglobin 12.4*; Platelets 150; Potassium 3.6; Sodium 142    Lipid Panel  Labs (Brief)       Component Value Date/Time   CHOL 116 11/04/2014   TRIG 66 11/04/2014   HDL 49 11/04/2014   CHOLHDL 2.5 08/27/2014 1018   VLDL 17 08/27/2014 1018   LDLCALC 54 11/04/2014       Wt Readings from Last 3 Encounters:  08/19/15 184 lb 14.4 oz (83.87 kg)  08/13/15 183 lb 4 oz (83.122 kg)  08/08/15 177 lb (80.287 kg)      ASSESSMENT AND PLAN:  1. Acute on chronic combined systolic and diastolic heart failure with anasarca  Estimated he is roughly 25-30 pounds overloaded. Will need more  furosemide, but would like to review his renal function and potassium level first.  He has underlying severe ischemic cardiomyopathy but the cause of his current decompensation could be the significant reduction in the percentage of biventricular pacing due to atrial fibrillation with poorly controlled rate.  Would like to replace his labetalol with a better rate control beta blocker such as metoprolol. Ideally we will also be able to stop his diltiazem which has a negative inotropic effect.  2. Permanent atrial fibrillation Not a candidate for anticoagulation due to history of intracranial bleeding  3. Coronary artery disease status post CABG He does not have any symptoms of coronary insufficiency at this time and I do not think you be a candidate for revascularization even if we identified new coronary stenoses.  4. Essential hypertension, poorly controlled There is room to increase his lisinopril , but first we'll make the changes in his beta blocker agent.  5. Dementia, Alzheimer's and vascular  there has been a clear and significant further decrease in his cognitive ability and functional status. I discussed with Mrs. Peppard the option of turning off ICD tachycardia therapies, while maintaining the biventricular pacing. We would therefore focus on interventions that improve quality of life, Rather than necessarily prolonging life at any cost.  6. CRT-D Medtronic Protecta 2013 (leads 2009).   Current medicines are reviewed at length with the patient today. The patient does not have concerns regarding medicines.  The following changes have been made: hospitalization  Labs/ tests ordered today include:  No orders of the defined types were placed in this encounter.    Patient Instructions  You are being admitted to [facility]. Please arrive at the admitting office in the [facility]. The [facility] is the main entrance to the hospital and is located on [address].     Signed, Sanda Klein, MD  08/19/2015 5:18 PM  Sanda Klein, MD, [facility] [phone] office [phone] pager

## 2015-08-19 NOTE — Patient Instructions (Signed)
You are being admitted to [facility]. Please arrive at the admitting office in the [facility]. The [facility] is the main entrance to the hospital and is located on [address].

## 2015-08-19 NOTE — Progress Notes (Signed)
Patient ID: DEMARCO BACCI, male   DOB: [birth year], 81 y.o.   MRN: [id number]     Cardiology Office Note   Date:  08/19/2015   ID:  DONNAVIN VANDENBRINK, DOB 05/29/1931, MRN [id number]  PCP:  Blanchie Serve, MD  Cardiologist:   Sanda Klein, MD   Chief Complaint  Patient presents with  . Annual Exam    No complaints of chest pain,SOB or dizziness.  Swelling of both legs for about a month.        History of Present Illness: YACINE GARRIGA is a 81 y.o. male who presents for  Follow-up 4 stage the combined systolic and diastolic heart failure secondary to ischemic cardiomyopathy , with a background of permanent atrial fibrillation and CRT-D  Device therapy.  This is his first office visit since February 2015 and his first pacemaker check since May 2016. He has progressive dementia and is a permanent nursing home resident. He is accompanied today by his wife.  He has had steady functional decline as well as cognitive decline over the last months , but over the last 1 month he has developed massive swelling of his lower extremities. He denies problems with chest pain and shortness of breath although he is breathing a little fast in the office today. He has not had syncope or palpitations and there have been no defibrillator discharges. He has anasarca on exam.  His weight has increased 28 pounds since July.  Interrogation of his device today shows that the efficiency of biventricular pacing is down to only 70% (as opposed to 88% last year). This is due to increased ventricular rates during permanent atrial fibrillation. In parallel, his thoracic impedance has shown drastic increase starting around June 1 , consistent with deteriorating volume status. There have been no episodes of sustained or nonsustained ventricular tachycardia. His average ventricular rate appears to be in the 80s and his nocturnal heart rate seems to be faster than during the day or at activity level is extremely  poor.  His echocardiogram shows atrial fibrillation and there are frequent native AV conducted beats with triggered pacing , fusion and pseudofusion. This suggests that the actual true percentage of biventricular pacing may be a lot lower. On the current tracing it appears to be around 50%.  He has a history of left temporal intracerebral hemorrhage in 2007 while on treatment with warfarin. He has never had a stroke or transient ischemic attack. He is not receiving anticoagulation but takes warfarin for stroke prevention.  He has an extensive history of coronary artery disease and previous bypass surgery. His last coronary angiogram was performed in 2007 and showed that the internal mammary artery bypass to the LAD and the saphenous vein graft bypass to the ramus intermedius artery are both patent , as was his right coronary artery.  His initial biventricular defibrillator was implanted in 2009 with a generator change out in 2013. The current device is a Medtronic protecta.  Lead parameters are good, but as mentioned the percentage of biventricular pacing is poor.    He had severely depressed left ventricular systolic function that  apparently improved back to normal range following implementation of CRT.  Repeat assessment with echocardiography during an admission for pneumonia in April 2015 showed a severely dilated left ventricle , severely depressed ejection fraction 25-30 % and there was evidence of restrictive filling.  Later that same month he had a hip fracture  And tolerated surgery without major complications. Repeat evaluation for his cardiomyopathy has  not been performed in part due to the progressive cognitive decline due to combined Alzheimer's and vascular dementia  Past Medical History  Diagnosis Date  . Hypertension   . Ischemic heart disease   . Hyperlipemia   . Depression   . Testicular cancer   . Polio     child polio  . Rheumatic fever   . Mild dementia   . Mild dementia    . Systolic heart failure     acute on chronic  . CAD (coronary artery disease)   . Chronic atrial fibrillation   . Vitamin D deficiency   . Type II or unspecified type diabetes mellitus without mention of complication, not stated as uncontrolled   . Other testicular hypofunction   . Permanent atrial fibrillation 01/21/2014  . ICD, biventricular, in situ - Medtronic Protecta 2013, leads 2009 01/21/2014    New Generator Medtronic [place] model number J242AST, serial number [id number]  Right atrial lead (chronic) Medtronic, model number [id number], serial [id number] (implanted 05/01/2008)  Right ventricular lead (chronic) Medtronic, model number [id number], serial number [id number] (implanted 05/01/2008)  Left ventricular lead Medtronic, model number [id number], serial number [id number] (implanted 05/01/2008)  Exp  . Pleural effusion, right 03/18/2014  . Emphysema 03/18/2014  . Pulmonary infiltrate 05/10/2014    CT chest 03/2014:  RUL interstitial/GGO     Past Surgical History  Procedure Laterality Date  . Coronary artery bypass graft  06/26/2002    LIMA to diagonal artery & SVG to the first obtuse margin  . Nm myocar perf wall motion  09/15/2006    Dilated LV w/small area of possible nontransmural inferior scar w/mild perinfarct ischemia.  No significant change from previous study.  . Cardiac defibrillator placement  05/01/2008    Medtronic  . Biv icd genertaor change out N/A 10/27/2012    Procedure: BIV ICD GENERTAOR CHANGE OUT;  Surgeon: Sanda Klein, MD;  Location: [facility] CATH LAB;  Service: Cardiovascular;  Laterality: N/A;  . Intramedullary (im) nail intertrochanteric Right 04/07/2015    Procedure: INTRAMEDULLARY (IM) NAIL INTERTROCHANTRIC;  Surgeon: Netta Cedars, MD;  Location: [place];  Service: Orthopedics;  Laterality: Right;     Current Outpatient Prescriptions  Medication Sig Dispense Refill  . acetaminophen (TYLENOL) 325 MG tablet Take 650 mg by mouth 3 (three) times daily. Scheduled then 2 by mouth  every 6 hours as needed for mild pain 60 tablet 1  . Amino Acids-Protein Hydrolys (FEEDING SUPPLEMENT, PRO-STAT SUGAR FREE 64,) LIQD Take 30 mLs by mouth daily.    Marland Kitchen aspirin 81 MG tablet Take 81 mg by mouth daily.    . bisacodyl (DULCOLAX) 10 MG suppository Place 1 suppository (10 mg total) rectally daily as needed for moderate constipation. 12 suppository 0  . Cholecalciferol (VITAMIN D3) 2000 UNITS TABS Take 2 tablets by mouth daily.     . citalopram (CELEXA) 20 MG tablet Take 20 mg by mouth daily.    . Cyanocobalamin (VITAMIN B-12 PO) Take 1,000 mcg by mouth daily.     Marland Kitchen diltiazem (CARDIZEM) 120 MG tablet Take 1 tablet (120 mg total) by mouth 3 (three) times daily. 270 tablet 3  . donepezil (ARICEPT) 23 MG TABS tablet Take 1 tablet (23 mg total) by mouth at bedtime. 60 tablet 1  . dorzolamide-timolol (COSOPT) 22.3-6.8 MG/ML ophthalmic solution Place 1 drop into both eyes 2 (two) times daily.     . ferrous sulfate 324 (65 FE) MG TBEC Take by mouth 2 (two) times daily.    . furosemide (  LASIX) 40 MG tablet Take 1 tablet (40 mg total) by mouth daily. 30 tablet 9  . HYDROcodone-acetaminophen (NORCO/VICODIN) 5-325 MG per tablet Take one tablet by mouth every four hours as needed for pain. Do not exceed 4grams of Tylenol in 24 hours 180 tablet 0  . insulin detemir (LEVEMIR) 100 UNIT/ML injection Inject 10 Units into the skin at bedtime.     . insulin lispro (HUMALOG) 100 UNIT/ML injection Check blood sugar three times a day before meals with sliding scale 5 units for cbg 250-350 8 units for cbg >350-450 10 units for cbg>450    . isosorbide mononitrate (IMDUR) 30 MG 24 hr tablet Take 90 mg by mouth daily. For Hypertension    . KLOR-CON M10 10 MEQ tablet TAKE 1 TABLET TWICE DAILY 180 tablet 0  . labetalol (NORMODYNE) 200 MG tablet TAKE 1 TABLET TWICE A DAY 180 tablet 99  . levothyroxine (SYNTHROID, LEVOTHROID) 75 MCG tablet TAKE 1 TABLET EVERY DAY 90 tablet 3  . linagliptin (TRADJENTA) 5 MG TABS  tablet Take 5 mg by mouth daily.    Marland Kitchen lisinopril (PRINIVIL,ZESTRIL) 10 MG tablet Take 10 mg by mouth daily.    Marland Kitchen loratadine (CLARITIN) 10 MG tablet Take 10 mg by mouth daily.    Marland Kitchen LORazepam (ATIVAN) 0.5 MG tablet Take one tablet by mouth at bedtime for anxiety 30 tablet 5  . Olopatadine HCl 0.2 % SOLN Place 1 drop into both eyes 2 (two) times daily.    . polyethylene glycol (MIRALAX / GLYCOLAX) packet Take 17 g by mouth daily.    . pravastatin (PRAVACHOL) 40 MG tablet TAKE 1 TABLET EVERY DAY FOR CHOLESTEROL 90 tablet 3  . traMADol (ULTRAM) 50 MG tablet Take one tablet by mouth twice daily for pain 60 tablet 5   No current facility-administered medications for this visit.    Allergies:   Lipitor    Social History:  The patient  reports that he quit smoking about 46 years ago. He has never used smokeless tobacco. He reports that he does not drink alcohol or use illicit drugs.   Family History:  The patient's family history includes Heart attack in his father; Other in his mother.    ROS:  Please see the history of present illness.    Otherwise, review of systems positive for none.   He is really unable to answer most of the questions. His wife states that he has not had much in the way of complaints. All other systems are reviewed and negative.    PHYSICAL EXAM: VS:  BP 169/81 mmHg  Pulse 70  Resp 22  Ht 5\' 7"  (1.702 m)  Wt 184 lb 14.4 oz (83.87 kg)  BMI 28.95 kg/m2 , BMI Body mass index is 28.95 kg/(m^2).  General: Alert, oriented  To self, circumstance, not to date or time, no distress Head: no evidence of trauma, PERRL, EOMI, no exophtalmos or lid lag, no myxedema, no xanthelasma; normal ears, nose and oropharynx Neck: 10-12 cm jugular venous pulsations and no hepatojugular reflux; brisk carotid pulses without delay and no carotid bruits Chest: clear to auscultation, no signs of consolidation by percussion or palpation, normal fremitus, symmetrical and full respiratory  excursions,  Healthy appearing left subclavian defibrillator site Cardiovascular:  Lateral displacement and effacement of the apical impulse, regular rhythm, normal first and second heart sounds,  Grade 2/6 early peaking aortic ejection murmur , radiating towards the carotids,no  diastolic murmurs, rubs or gallops Abdomen: no tenderness or distention, no  masses by palpation, no abnormal pulsatility or arterial bruits, normal bowel sounds, no hepatosplenomegaly Extremities: no clubbing, cyanosis;  3-4+ Hard pitting edema from the feet to the groin bilaterally and presacral edema; 2+ radial, ulnar and brachial pulses bilaterally;  Edema prevents palpation of pulses in the lower extremities; no subclavian or femoral bruits Neurological: grossly nonfocal Psych: euthymic mood, full affect   EKG:  EKG is ordered today. The ekg ordered today demonstrates  Atrial fibrillation , roughly 50% native A-V conduction and 50% ventricular paced beats with triggered pacing and pseudofusion   Recent Labs: 10/24/2014: Pro B Natriuretic peptide (BNP) 2991.0* 04/08/2015: TSH 0.985 07/21/2015: ALT 4*; BUN 16; Creatinine 1.0; Hemoglobin 12.4*; Platelets 150; Potassium 3.6; Sodium 142    Lipid Panel    Component Value Date/Time   CHOL 116 11/04/2014   TRIG 66 11/04/2014   HDL 49 11/04/2014   CHOLHDL 2.5 08/27/2014 1018   VLDL 17 08/27/2014 1018   LDLCALC 54 11/04/2014      Wt Readings from Last 3 Encounters:  08/19/15 184 lb 14.4 oz (83.87 kg)  08/13/15 183 lb 4 oz (83.122 kg)  08/08/15 177 lb (80.287 kg)      ASSESSMENT AND PLAN:  1.  Acute on chronic combined systolic and diastolic heart failure with anasarca  Estimated he is roughly 25-30 pounds overloaded. Will need more furosemide, but would like to review his renal function and potassium level first.  He has underlying severe ischemic cardiomyopathy but the cause of his current decompensation could be the significant reduction in the percentage  of biventricular pacing due to atrial fibrillation with poorly controlled rate.  Would like to replace his labetalol with a better rate control beta blocker such as metoprolol. Ideally we will also be able to stop his diltiazem which has a negative inotropic effect.  2.  Permanent atrial fibrillation  Not a candidate for anticoagulation due to history of intracranial bleeding  3. Coronary artery disease status post CABG  He does not have any symptoms of coronary insufficiency at this time and I do not think you be a candidate for revascularization even if we identified new coronary stenoses.  4. Essential hypertension, poorly controlled  There is room to increase his lisinopril , but first we'll make the changes in his beta blocker agent.  5. Dementia, Alzheimer's and vascular   there has been a clear and significant further decrease in his cognitive ability and functional status. I discussed with Mrs. Maraj the option of turning off ICD tachycardia therapies, while maintaining the biventricular pacing. We would therefore focus on interventions that improve quality of life,  Rather than necessarily prolonging life at any cost.  6. CRT-D Medtronic Protecta 2013 (leads 2009).   Current medicines are reviewed at length with the patient today.  The patient does not have concerns regarding medicines.  The following changes have been made:   hospitalization  Labs/ tests ordered today include:  No orders of the defined types were placed in this encounter.     Patient Instructions  You are being admitted to [facility]. Please arrive at the admitting office in the [facility]. The [place] is the main entrance to the hospital and is located on [address].     Signed, Sanda Klein, MD  08/19/2015 5:18 PM    Sanda Klein, MD, [facility] [phone] office [phone] pager

## 2015-08-20 ENCOUNTER — Encounter (HOSPITAL_COMMUNITY): Payer: Self-pay | Admitting: General Practice

## 2015-08-20 DIAGNOSIS — I481 Persistent atrial fibrillation: Secondary | ICD-10-CM

## 2015-08-20 DIAGNOSIS — F0390 Unspecified dementia without behavioral disturbance: Secondary | ICD-10-CM

## 2015-08-20 LAB — CBC
HEMATOCRIT: 41.3 % (ref 39.0–52.0)
HEMOGLOBIN: 13 g/dL (ref 13.0–17.0)
MCH: 29.1 pg (ref 26.0–34.0)
MCHC: 31.5 g/dL (ref 30.0–36.0)
MCV: 92.4 fL (ref 78.0–100.0)
Platelets: 154 10*3/uL (ref 150–400)
RBC: 4.47 MIL/uL (ref 4.22–5.81)
RDW: 16.7 % — ABNORMAL HIGH (ref 11.5–15.5)
WBC: 7.6 10*3/uL (ref 4.0–10.5)

## 2015-08-20 LAB — BASIC METABOLIC PANEL
ANION GAP: 8 (ref 5–15)
BUN: 12 mg/dL (ref 6–20)
CALCIUM: 8.8 mg/dL — AB (ref 8.9–10.3)
CO2: 29 mmol/L (ref 22–32)
Chloride: 103 mmol/L (ref 101–111)
Creatinine, Ser: 1.1 mg/dL (ref 0.61–1.24)
Glucose, Bld: 59 mg/dL — ABNORMAL LOW (ref 65–99)
POTASSIUM: 3.4 mmol/L — AB (ref 3.5–5.1)
Sodium: 140 mmol/L (ref 135–145)

## 2015-08-20 LAB — GLUCOSE, CAPILLARY
GLUCOSE-CAPILLARY: 46 mg/dL — AB (ref 65–99)
GLUCOSE-CAPILLARY: 117 mg/dL — AB (ref 65–99)
GLUCOSE-CAPILLARY: 116 mg/dL — AB (ref 65–99)
Glucose-Capillary: 79 mg/dL (ref 65–99)

## 2015-08-20 NOTE — Progress Notes (Signed)
Utilization review completed. Lashauna Arpin, RN, BSN. 

## 2015-08-20 NOTE — Care Management Note (Addendum)
Case Management Note  Patient Details  Name: Troy Warren MRN: [id number] Date of Birth: 1931-10-22  Subjective/Objective:    Pt admitted for Acute on Chronic CHF. Pt is from [facility].                 Action/Plan: CSW to assist with disposition needs once stable for d/c. No further needs from CM at this time.    Expected Discharge Date:                  Expected Discharge Plan:  Skilled Nursing Facility  In-House Referral:  Clinical Social Work  Discharge planning Services  CM Consult  Post Acute Care Choice:  NA Choice offered to:  NA  DME Arranged:  N/A DME Agency:  NA  HH Arranged:  NA HH Agency:  NA  Status of Service:  Completed, signed off  Medicare Important Message Given:    Date Medicare IM Given:    Medicare IM give by:    Date Additional Medicare IM Given:    Additional Medicare Important Message give by:     If discussed at [place] of Stay Meetings, dates discussed:    Additional Comments: 1200 08-25-15 Jacqlyn Krauss, RN,BSN [phone] Pt plan for d/c to [facility] today. No further needs from CM at this time.   Bethena Roys, RN 08/20/2015, 12:14 PM

## 2015-08-20 NOTE — Progress Notes (Signed)
Cardiac monitoring strip reveals  undersensing pacer; pacer spikes seen on top of QRS complex. HR in the 80's. Cardiology team notified. Will continue to monitor closely.  Ferdinand Lango, RN

## 2015-08-20 NOTE — Evaluation (Addendum)
Occupational Therapy Evaluation Patient Details Name: Troy Warren MRN: [id number] DOB: 01-06-1931 Today's Date: 08/20/2015    History of Present Illness 81 y.o. 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 Admitted directly from office. Interrogation of his device today shows that the efficiency of biventricular pacing is down to only 70% (as opposed to 88% last year)   Clinical Impression   Pt admitted with above. Unsure of pt's PLOF with ADLs. Feel pt will benefit from acute OT to increase independence and activity tolerance prior to d/c.     Follow Up Recommendations  SNF    Equipment Recommendations  Other (comment) (defer to next venue)    Recommendations for Other Services       Precautions / Restrictions Precautions Precautions: Fall Restrictions Weight Bearing Restrictions: No      Mobility Bed Mobility Overal bed mobility: Needs Assistance Bed Mobility: Supine to Sit;Sit to Supine     Supine to sit: Supervision Sit to supine/sidelying: Supervision     Transfers Overall transfer level: Needs assistance Equipment used: Rolling walker (2 wheeled) Transfers: Sit to/from Stand Sit to Stand: Min guard            Balance Used RW for support for ambulation.                       ADL Overall ADL's : Needs assistance/impaired                     Lower Body Dressing: Moderate-Maximal assistance;Sit to/from stand   Toilet Transfer: Min guard;Ambulation;RW (sit to stand from bed)           Functional mobility during ADLs: Min guard;Rolling walker General ADL Comments: Educated on energy conservation and deep breathing technique. Pt unable to doff socks sitting on EOB.     Vision     Perception     Praxis       Pertinent Vitals/Pain Pain Assessment: Faces Faces Pain Scale: Hurts little more Pain Location: Lt foot Pain Intervention(s): Monitored during session; limited activity in session   O2 reading in 70s-80s in session with pt on RA (monitor appeared to have difficulty reading) and after ambulating short distance. Reapplied O2 on pt and appeared to trend up to 90s (monitor appeared to have difficulty reading)     Hand Dominance Right   Extremity/Trunk Assessment Upper Extremity Assessment Upper Extremity Assessment: Overall WFL for tasks assessed   Lower Extremity Assessment Lower Extremity Assessment: Defer to PT evaluation      Communication Communication Communication: HOH   Cognition Arousal/Alertness: Awake/alert Behavior During Therapy: WFL for tasks assessed/performed Overall Cognitive Status: History of cognitive impairments - at baseline (also no family/caregiver present to determine baseline)                   General Comments       Exercises       Shoulder Instructions      Home Living Family/patient expects to be discharged to:: Skilled nursing facility                                        Prior Functioning/Environment  Level of Independence: Needs assistance  Gait / Transfers Assistance Needed: has been at [facility] since hip fx in 4/16, reports that he ambulates with RW     Comments: pt with dementia, unsure of true baseline    OT Diagnosis: Acute pain;Generalized weakness   OT Problem List: Decreased knowledge of use of DME or AE;Decreased strength;Decreased activity tolerance;Decreased cognition;Pain;Decreased knowledge of precautions   OT Treatment/Interventions: Self-care/ADL training;DME and/or AE instruction;Therapeutic activities;Patient/family education;Balance training;Cognitive remediation/compensation;Therapeutic exercise;Energy conservation    OT Goals(Current goals can be found in the care plan section) Acute  Rehab OT Goals Patient Stated Goal: none stated OT Goal Formulation: With patient Time For Goal Achievement: 08/27/15 Potential to Achieve Goals: Fair ADL Goals Pt Will Perform Grooming: with set-up;with supervision;standing Pt Will Perform Upper Body Bathing: with set-up;sitting Pt Will Perform Lower Body Bathing: with min assist;with adaptive equipment;sit to/from stand Pt Will Perform Upper Body Dressing: with set-up;sitting Pt Will Perform Lower Body Dressing: with mod assist;sit to/from stand (with or without AE) Pt Will Transfer to Toilet: with supervision;ambulating;bedside commode Pt Will Perform Toileting - Clothing Manipulation and hygiene: sit to/from stand;with min guard assist  OT Frequency: Min 2X/week   Barriers to D/C:            Co-evaluation              End of Session Equipment Utilized During Treatment: Gait belt;Rolling walker;Other (comment) (Oxygen for part of session) Nurse Communication: Other (comment) (O2 sats)  Activity Tolerance: Patient limited by fatigue/pain Patient left: in bed;with call bell/phone within reach;with bed alarm set   Time: 5038-8828 OT Time Calculation (min): 14 min Charges:  OT General Charges $OT Visit: 1 Procedure OT Evaluation $Initial OT Evaluation Tier I: 1 Procedure G-CodesBenito Mccreedy OTR/L [id number] 08/20/2015, 4:47 PM

## 2015-08-20 NOTE — Evaluation (Signed)
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 Admitted directly from office. Interrogation of his device today shows that the efficiency of biventricular pacing is down to only 70% (as opposed to 88% last year)  Clinical Impression  Pt admitted with above diagnosis. Pt currently with functional limitations due to the deficits listed below (see PT Problem List). Pt ambulated 44' with RW and min A, limited by left foot pain and O2 desat to 83%. 2L O2 reapplied after session.  Pt will benefit from skilled PT to increase their independence and safety with mobility to allow discharge to the venue listed below.       Follow Up Recommendations SNF    Equipment Recommendations  None recommended by PT    Recommendations for Other Services       Precautions / Restrictions Precautions Precautions: Fall Restrictions Weight Bearing Restrictions: No      Mobility  Bed Mobility Overal bed mobility: Needs Assistance Bed Mobility: Supine to Sit;Sit to Supine     Supine to sit: Min assist Sit to supine: Min assist   General bed mobility comments: pt able to sit up without physical assist but needed min A to scoot to EOB as well as to get legs back into bed for supine  Transfers Overall transfer level: Needs assistance Equipment used: Rolling walker (2 wheeled) Transfers: Sit to/from Stand Sit to Stand: Min assist         General transfer comment: min A for power up and to steady  Ambulation/Gait Ambulation/Gait assistance: Min assist Ambulation Distance (Feet): 18 Feet Assistive device: Rolling walker (2 wheeled) Gait  Pattern/deviations: Step-through pattern;Trunk flexed;Shuffle Gait velocity: decreased Gait velocity interpretation: <1.8 ft/sec, indicative of risk for recurrent falls General Gait Details: pt with L foot pain with ambulation which limited distance, also desat to 83% on RA  Stairs            Wheelchair Mobility    Modified Rankin (Stroke Patients Only)       Balance Overall balance assessment: Needs assistance;History of Falls Sitting-balance support: Bilateral upper extremity supported Sitting balance-Leahy Scale: Fair     Standing balance support: Bilateral upper extremity supported Standing balance-Leahy Scale: Poor                               Pertinent Vitals/Pain Pain Assessment: Faces Faces Pain Scale: Hurts even more Pain Location: left foot Pain Intervention(s): Limited activity within patient's tolerance;Monitored during session    [place] expects to be discharged to:: Skilled nursing facility                      Prior Function Level of Independence: Needs assistance   Gait / Transfers Assistance Needed: has been at [facility] since hip fx in 4/16, reports that he ambulates with RW     Comments: pt with dementia, hard to discern true baseline     Hand Dominance   Dominant Hand: Right    Extremity/Trunk Assessment   Upper Extremity Assessment: Generalized weakness  Lower Extremity Assessment: Generalized weakness      Cervical / Trunk Assessment: Kyphotic  Communication   Communication: No difficulties  Cognition Arousal/Alertness: Awake/alert Behavior During Therapy: Agitated Overall Cognitive Status: History of cognitive impairments - at baseline       Memory: Decreased short-term memory              General Comments General comments (skin integrity, edema, etc.): 94% O2 sats on 2L O2, desat to 83% on RA, O2 reapplied after session    Exercises         Assessment/Plan    PT Assessment Patient needs continued PT services  PT Diagnosis Abnormality of gait;Acute pain;Generalized weakness;Difficulty walking   PT Problem List Decreased strength;Decreased activity tolerance;Decreased balance;Decreased mobility;Decreased cognition;Decreased knowledge of precautions;Pain;Cardiopulmonary status limiting activity  PT Treatment Interventions DME instruction;Gait training;Functional mobility training;Therapeutic activities;Therapeutic exercise;Balance training;Patient/family education   PT Goals (Current goals can be found in the Care Plan section) Acute Rehab PT Goals Patient Stated Goal: none stated PT Goal Formulation: With patient Time For Goal Achievement: 09/03/15 Potential to Achieve Goals: Good    Frequency Min 2X/week   Barriers to discharge        Co-evaluation               End of Session Equipment Utilized During Treatment: Gait belt;Oxygen Activity Tolerance: Patient limited by pain Patient left: in bed;with call bell/phone within reach;with bed alarm set Nurse Communication: Mobility status         Time: 9798-9211 PT Time Calculation (min) (ACUTE ONLY): 16 min   Charges:   PT Evaluation $Initial PT Evaluation Tier I: 1 Procedure     PT G Codes:      Leighton Roach, PT  Acute Rehab Services  [place], [place] 08/20/2015, 3:32 PM

## 2015-08-20 NOTE — Clinical Social Work Note (Signed)
Clinical Social Work Assessment  Patient Details  Name: Troy Warren MRN: [id number] Date of Birth: [birth year]  Date of referral:  08/20/15               Reason for consult:  Facility Placement                Permission sought to share information with:  Chartered certified accountant granted to share information::  Yes, Verbal Permission Granted  Name::        Agency::   [facility])  Relationship::     Contact Information:     Housing/Transportation Living arrangements for the past 2 months:  [place] of Information:  Adult Children Patient Interpreter Needed:  None Criminal Activity/Legal Involvement Pertinent to Current Situation/Hospitalization:  No - Comment as needed Significant Relationships:  Adult Children, Other Family Members, Spouse Lives with:  Facility Resident Do you feel safe going back to the place where you live?  Yes Need for family participation in patient care:  Yes (Comment)  Care giving concerns:  CSW notified pt admitted from [facility] Worker assessment / plan:  CSW spoke with pt daughter over the phone and she confirmed pt is a long term resident at [facility] and has been there for about 10 months. Pt daughter expressed no concerns and confirmed plan is for pt to return at dc.  CSW spoke with the facility and they are requesting CSW attempt to get new insurance authorization so pt is able to receive therapy on return. Pt will need PT/OT evaluation. CSW notified nursing.  Employment status:  Retired Nurse, adult PT Recommendations:  Not assessed at this time Information / Referral to community resources:  [place]  Patient/Family's Response to care: Pt family agreeable to pt returning to facility at [facility].  Patient/Family's Understanding of and Emotional Response to Diagnosis, Current Treatment, and Prognosis:  Pt daughter has good understanding of pt  condition. Emotional response appropriate at this time.   Emotional Assessment Appearance:  Appears stated age, Well-Groomed Attitude/Demeanor/Rapport:  Unable to Assess Affect (typically observed):  Unable to Assess Orientation:  Oriented to Self Alcohol / Substance use:  Not Applicable Psych involvement (Current and /or in the community):  No (Comment)  Discharge Needs  Concerns to be addressed:  Discharge Planning Concerns Readmission within the last 30 days:  No Current discharge risk:  Dependent with Mobility, Cognitively Impaired Barriers to Discharge:  Continued Medical Work up   [employer], [place]

## 2015-08-20 NOTE — Progress Notes (Signed)
Hypoglycemic Event  CBG: 46  Treatment: Meal tray  Symptoms: None  Follow-up CBG: Time:1021 AM CBG Result:117  Possible Reasons for Event: Inadequate meal intake     [facility] C  Remember to initiate Hypoglycemia Order Set & complete

## 2015-08-20 NOTE — Progress Notes (Signed)
Inpatient Diabetes Program Recommendations  AACE/ADA: New Consensus Statement on Inpatient Glycemic Control (2013)  Target Ranges:  Prepandial:   less than 140 mg/dL      Peak postprandial:   less than 180 mg/dL (1-2 hours)      Critically ill patients:  140 - 180 mg/dL   Glycemic Control Recommendations  Diabetes history: DM2 Outpatient Diabetes medications: Levemir 10 units QHS, Humalog 5-10 units tidwc, tradjenta 5 mg QD Current orders for Inpatient glycemic control: Levemir 10 units QHS, tradjenta 5 mg QD  Results for Troy Warren, Troy Warren (MRN [id number]) as of 08/20/2015 17:25  Ref. Range 08/19/2015 22:00 08/20/2015 08:30 08/20/2015 10:21 08/20/2015 11:23 08/20/2015 16:35  Glucose-Capillary Latest Ref Range: 65-99 mg/dL 173 (H) 46 (L) 117 (H) 116 (H) 79  Results for Troy Warren, Troy Warren (MRN [id number]) as of 08/20/2015 17:25  Ref. Range 07/21/2015 00:00  Hemoglobin A1C Latest Ref Range: 4.0-6.0 % 6.8 (A)   Blood sugars 46-173. Hypoglycemia this am.  Recommendations: Decrease Levemir to 5 units QHS Add Novolog sensitive tidwc D/C Tradjenta while inpatient.  Will f/u in am. Thank you. Lorenda Peck, RD, LDN, CDE Inpatient Diabetes Coordinator [phone]

## 2015-08-20 NOTE — Progress Notes (Signed)
Patient Name: Troy Warren Date of Encounter: 08/20/2015  Primary cardiologist: Dr. Sallyanne Kuster   Active Problems:   Acute on chronic combined systolic and diastolic heart failure    SUBJECTIVE  Denies any CP. When asked about SOB, he states "i'm all right."  CURRENT MEDS . acetaminophen  650 mg Oral TID  . aspirin EC  81 mg Oral Daily  . citalopram  20 mg Oral Daily  . donepezil  23 mg Oral QHS  . dorzolamide-timolol  1 drop Both Eyes BID  . ferrous sulfate  325 mg Oral BID WC  . furosemide  40 mg Intravenous BID  . insulin detemir  10 Units Subcutaneous QHS  . isosorbide mononitrate  90 mg Oral Daily  . levothyroxine  75 mcg Oral QAC breakfast  . linagliptin  5 mg Oral Daily  . lisinopril  10 mg Oral Daily  . metoprolol tartrate  100 mg Oral BID  . potassium chloride  10 mEq Oral BID  . pravastatin  40 mg Oral q1800    OBJECTIVE  Filed Vitals:   08/19/15 2055 08/20/15 0034 08/20/15 0500 08/20/15 0836  BP: 167/79 173/73 188/67 185/99  Pulse:  70 74 89  Temp:  98.4 F (36.9 C) 97.4 F (36.3 C)   TempSrc:  Oral Oral   Resp: 18 18 20    Weight:   183 lb 12.8 oz (83.371 kg)   SpO2:  99% 93%     Intake/Output Summary (Last 24 hours) at 08/20/15 1115 Last data filed at 08/20/15 0907  Gross per 24 hour  Intake    240 ml  Output    900 ml  Net   -660 ml   Filed Weights   08/20/15 0500  Weight: 183 lb 12.8 oz (83.371 kg)    PHYSICAL EXAM  General: Pleasant, NAD. Neuro: Alert. Questionable mental status, appears to be demented.  Moves all extremities spontaneously. Psych: Normal affect. HEENT:  Normal  Neck: Supple without bruits  +JVD. Lungs:  Resp regular and unlabored, CTA with mild decreased breath sound in bibasilar region. Heart: RRR no s3, s4, or murmurs. Abdomen: Soft, non-tender, non-distended, BS + x 4.  Extremities: No clubbing, cyanosis. DP/PT/Radials 2+ and equal bilaterally. 1-2+ pitting edema.  Accessory Clinical Findings  CBC  Recent  Labs  08/19/15 1930 08/20/15 0426  WBC 7.7 7.6  HGB 13.0 13.0  HCT 41.9 41.3  MCV 92.7 92.4  PLT 149* 616   Basic Metabolic Panel  Recent Labs  08/19/15 1930 08/20/15 0426  NA 139 140  K 3.7 3.4*  CL 100* 103  CO2 29 29  GLUCOSE 163* 59*  BUN 12 12  CREATININE 1.16 1.10  CALCIUM 8.8* 8.8*   Liver Function Tests  Recent Labs  08/19/15 1930  AST 15  ALT 7*  ALKPHOS 96  BILITOT 0.9  PROT 6.2*  ALBUMIN 3.1*    TELE Paced rhythm with underlying a-fib    ECG  No new EKG  Echocardiogram 03/19/2014  LV EF: 25% -  30%  ------------------------------------------------------------ Study Conclusions  - Left ventricle: The cavity size was severely dilated. Wall thickness was increased in a pattern of severe LVH. There was mild concentric hypertrophy. Systolic function was severely reduced. The estimated ejection fraction was in the range of 25% to 30%. Wall motion was normal; there were no regional wall motion abnormalities. Doppler parameters are consistent with a reversible restrictive pattern, indicative of decreased left ventricular diastolic compliance and/or increased left atrial pressure (grade  3 diastolic dysfunction). Doppler parameters are consistent withseverely elevated ventricular end-diastolic filling pressure and elevated left atrial filling pressure. - Aortic valve: Trileaflet; moderately thickened, moderately calcified leaflets. Cusp separation was moderately reduced. There was mild stenosis. Mild regurgitation. - Mitral valve: Mild regurgitation. - Left atrium: The atrium was mildly dilated. - Right ventricle: The cavity size was mildly dilated. Wall thickness was normal. Systolic function was normal. - Tricuspid valve: Mild regurgitation. - Pulmonary arteries: Systolic pressure was mildly increased. PA peak pressure: 62mm Hg (S). - Impressions: Compared to the prior study from 10/10/2009 the left  ventricle is now severely dilated with severely impaired ejection fraction and restrictive pattern of diastolic function with severely increased LV filling pressures. LVEF is 25-30%, previosuly reported as 40-45%. RVSP is now mildly elevated, previously reported as normal. Impressions:  - Compared to the prior study from 10/10/2009 the left ventricle is now severely dilated with severely impaired ejection fraction and restrictive pattern of diastolic function with severely increased LV filling pressures. LVEF is 25-30%, previosuly reported as 40-45%. RVSP is now mildly elevated, previously reported as normal.    Radiology/Studies  No results found.  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 Admitted directly from office. Interrogation of his device today shows that the efficiency of biventricular pacing is down to only 70% (as opposed to 88% last year)  1. Acute on chronic combined systolic and diastolic HF  - still fluid overloaded, continue IV lasix, did not put out as much as I expected. If continue to be like this, will increase IV lasix to 80mg  BID. Did not appreciate significant rale on exam, but still has significant edema and JVD.   2. Permanent atrial fibrillation: rate controlled on metoprolol.  3. CAD s/p CABG  4. HTN  5. Dementia, alzheimer's and vascular.  6. H/o ICM s/p Medtronic CRT-D  Signed, Woodward Ku Pager: [phone]  I have seen and examined the patient along with Almyra Deforest PA-C.  I have reviewed the chart, notes and new data.  I agree with PA's note.  Key new complaints: calm, disoriented, family no longer at the bedside Key examination changes: still with massive edema, clear lungs, +++JVD, BP  high Key new findings / data:  K 3.4; on telemetry, rate control is much improved and BiV pacing is mostly present.  PLAN: Add metolazone and spironolactone and increase loop diuretic dose. Monitor electrolytes and renal function daily Prior to DC need to readdress code status and strongly consider discontinuing tachy therapies (Leave BiV pacing on, turn ICD shocks/ATP off).  Sanda Klein, MD, [place] [phone] 08/20/2015, 12:03 PM

## 2015-08-21 DIAGNOSIS — F0280 Dementia in other diseases classified elsewhere without behavioral disturbance: Secondary | ICD-10-CM

## 2015-08-21 DIAGNOSIS — G309 Alzheimer's disease, unspecified: Secondary | ICD-10-CM

## 2015-08-21 DIAGNOSIS — F0150 Vascular dementia without behavioral disturbance: Secondary | ICD-10-CM

## 2015-08-21 LAB — C DIFFICILE QUICK SCREEN W PCR REFLEX
C Diff antigen: NEGATIVE
C Diff interpretation: NEGATIVE
C Diff toxin: NEGATIVE

## 2015-08-21 LAB — GLUCOSE, CAPILLARY
GLUCOSE-CAPILLARY: 187 mg/dL — AB (ref 65–99)
GLUCOSE-CAPILLARY: 62 mg/dL — AB (ref 65–99)
Glucose-Capillary: 96 mg/dL (ref 65–99)
Glucose-Capillary: 186 mg/dL — ABNORMAL HIGH (ref 65–99)
Glucose-Capillary: 251 mg/dL — ABNORMAL HIGH (ref 65–99)

## 2015-08-21 LAB — BASIC METABOLIC PANEL WITH GFR
Anion gap: 8 (ref 5–15)
BUN: 11 mg/dL (ref 6–20)
CO2: 31 mmol/L (ref 22–32)
Calcium: 9 mg/dL (ref 8.9–10.3)
Chloride: 101 mmol/L (ref 101–111)
Creatinine, Ser: 0.97 mg/dL (ref 0.61–1.24)
GFR calc Af Amer: >60 mL/min
GFR calc non Af Amer: >60 mL/min
Glucose, Bld: 53 mg/dL — ABNORMAL LOW (ref 65–99)
Potassium: 3.1 mmol/L — ABNORMAL LOW (ref 3.5–5.1)
Sodium: 140 mmol/L (ref 135–145)

## 2015-08-21 MED ORDER — INSULIN ASPART 100 UNIT/ML ~~LOC~~ SOLN
0.0000 [IU] | Freq: Three times a day (TID) | SUBCUTANEOUS | Status: DC
  Administered 2015-08-21 – 2015-08-22 (×2): 3 [IU] via SUBCUTANEOUS
  Administered 2015-08-23: 1 [IU] via SUBCUTANEOUS
  Administered 2015-08-23 – 2015-08-24 (×2): 3 [IU] via SUBCUTANEOUS
  Administered 2015-08-25: 2 [IU] via SUBCUTANEOUS

## 2015-08-21 MED ORDER — INSULIN DETEMIR 100 UNIT/ML ~~LOC~~ SOLN
5.0000 [IU] | Freq: Every day | SUBCUTANEOUS | Status: DC
  Administered 2015-08-21: 5 [IU] via SUBCUTANEOUS
  Filled 2015-08-21 (×2): qty 0.05

## 2015-08-21 MED ORDER — INSULIN ASPART 100 UNIT/ML ~~LOC~~ SOLN
0.0000 [IU] | Freq: Every day | SUBCUTANEOUS | Status: DC
  Administered 2015-08-21: 3 [IU] via SUBCUTANEOUS
  Administered 2015-08-22 – 2015-08-24 (×2): 2 [IU] via SUBCUTANEOUS

## 2015-08-21 MED ORDER — LEVALBUTEROL HCL 0.63 MG/3ML IN NEBU: 0.6300 mg | INHALATION_SOLUTION | Freq: Four times a day (QID) | RESPIRATORY_TRACT | Status: DC | PRN

## 2015-08-21 MED ORDER — POTASSIUM CHLORIDE CRYS ER 20 MEQ PO TBCR
20.0000 meq | EXTENDED_RELEASE_TABLET | Freq: Two times a day (BID) | ORAL | Status: DC
  Administered 2015-08-21 – 2015-08-25 (×8): 20 meq via ORAL
  Filled 2015-08-21 (×8): qty 1

## 2015-08-21 MED ORDER — METOPROLOL TARTRATE 50 MG PO TABS
150.0000 mg | ORAL_TABLET | Freq: Two times a day (BID) | ORAL | Status: DC
  Administered 2015-08-21 – 2015-08-25 (×8): 150 mg via ORAL
  Filled 2015-08-21 (×16): qty 1

## 2015-08-21 MED ORDER — SPIRONOLACTONE 25 MG PO TABS
25.0000 mg | ORAL_TABLET | Freq: Every day | ORAL | Status: DC
  Administered 2015-08-21 – 2015-08-25 (×5): 25 mg via ORAL
  Filled 2015-08-21 (×5): qty 1

## 2015-08-21 NOTE — Progress Notes (Signed)
Patient Name: Troy Warren Date of Encounter: 08/21/2015  Active Problems:   Permanent atrial fibrillation   ICD, biventricular, in situ - Medtronic Protecta 2013, leads 2009   Hypertension associated with stage 3 chronic kidney disease due to type 2 diabetes mellitus   DM (diabetes mellitus) type II controlled with renal manifestation   Mixed Alzheimer's and vascular dementia   Acute on chronic combined systolic and diastolic heart failure   Length of Stay: 2  SUBJECTIVE  Greatly improved diuresis by in/out. Weight not much changed though. Edema seems a lot less. HR fair, BiV pacing percentage appears much better BP still high K 3.1  CURRENT MEDS . acetaminophen  650 mg Oral TID  . aspirin EC  81 mg Oral Daily  . citalopram  20 mg Oral Daily  . donepezil  23 mg Oral QHS  . dorzolamide-timolol  1 drop Both Eyes BID  . ferrous sulfate  325 mg Oral BID WC  . furosemide  40 mg Intravenous BID  . insulin aspart  0-15 Units Subcutaneous TID WC  . insulin aspart  0-5 Units Subcutaneous QHS  . insulin detemir  5 Units Subcutaneous QHS  . isosorbide mononitrate  90 mg Oral Daily  . levothyroxine  75 mcg Oral QAC breakfast  . lisinopril  10 mg Oral Daily  . metoprolol tartrate  100 mg Oral BID  . potassium chloride  20 mEq Oral BID  . pravastatin  40 mg Oral q1800  . spironolactone  25 mg Oral Daily    OBJECTIVE   Intake/Output Summary (Last 24 hours) at 08/21/15 1008 Last data filed at 08/21/15 0635  Gross per 24 hour  Intake      0 ml  Output   3750 ml  Net  -3750 ml   Filed Weights   08/20/15 0500 08/21/15 0400  Weight: 183 lb 12.8 oz (83.371 kg) 184 lb 8.4 oz (83.7 kg)    PHYSICAL EXAM Filed Vitals:   08/21/15 0015 08/21/15 0400 08/21/15 0834 08/21/15 0838  BP:    188/83  Pulse:      Temp: 98.2 F (36.8 C) 97.9 F (36.6 C) 97.8 F (36.6 C)   TempSrc: Oral Oral Oral   Resp:      Weight:  184 lb 8.4 oz (83.7 kg)    SpO2:       General: Alert,  disoriented to everything but self, no distress Head: no evidence of trauma, PERRL, EOMI, no exophtalmos or lid lag, no myxedema, no xanthelasma; normal ears, nose and oropharynx Neck: 5-6 cm jugular venous pulsations and no hepatojugular reflux; brisk carotid pulses without delay and no carotid bruits Chest: clear to auscultation, no signs of consolidation by percussion or palpation, normal fremitus, symmetrical and full respiratory excursions Cardiovascular: normal position and quality of the apical impulse, irregular rhythm, normal first and second heart sounds, no rubs or gallops, 1/6 LLSB holosystolic murmur Abdomen: no tenderness or distention, no masses by palpation, no abnormal pulsatility or arterial bruits, normal bowel sounds, no hepatosplenomegaly Extremities: no clubbing, cyanosis; 2-3 + hard pitting leg edema, now down to knees; 2+ radial, ulnar and brachial pulses bilaterally; 2+ right femoral, posterior tibial and dorsalis pedis pulses; 2+ left femoral, posterior tibial and dorsalis pedis pulses; no subclavian or femoral bruits Neurological: grossly nonfocal  LABS  CBC  Recent Labs  08/19/15 1930 08/20/15 0426  WBC 7.7 7.6  HGB 13.0 13.0  HCT 41.9 41.3  MCV 92.7 92.4  PLT 149* 154  Basic Metabolic Panel  Recent Labs  08/20/15 0426 08/21/15 0543  NA 140 140  K 3.4* 3.1*  CL 103 101  CO2 29 31  GLUCOSE 59* 53*  BUN 12 11  CREATININE 1.10 0.97  CALCIUM 8.8* 9.0   Liver Function Tests  Recent Labs  08/19/15 1930  AST 15  ALT 7*  ALKPHOS 96  BILITOT 0.9  PROT 6.2*  ALBUMIN 3.1*    Radiology Studies Imaging results have been reviewed and No results found.  TELE atrial fibrillation, mostly V paced   ASSESSMENT AND PLAN 1. Acute on chronic combined systolic and diastolic HF Improved diuresis, continue furosemide and add spironolactone. Might be ready to switch to po loop diuretic tomorrow.   2. Permanent atrial fibrillation Rate controlled  better on metoprolol, but still room for improvement. Increase metoprolol dose  3. CAD s/p CABG  4. HTN  5. Dementia, alzheimer's and vascular.  6. H/o ICM s/p Medtronic CRT-D Still need to review discontinuation of tachy therapy with family   Sanda Klein, MD, [facility] [phone] office [phone] pager 08/21/2015 10:08 AM

## 2015-08-22 LAB — BASIC METABOLIC PANEL
ANION GAP: 7 (ref 5–15)
BUN: 14 mg/dL (ref 6–20)
CHLORIDE: 100 mmol/L — AB (ref 101–111)
CO2: 34 mmol/L — AB (ref 22–32)
Calcium: 8.9 mg/dL (ref 8.9–10.3)
Creatinine, Ser: 1.11 mg/dL (ref 0.61–1.24)
GFR calc Af Amer: >60 mL/min (ref >60)
GFR calc non Af Amer: >60 mL/min (ref >60)
GLUCOSE: 87 mg/dL (ref 65–99)
POTASSIUM: 3.4 mmol/L — AB (ref 3.5–5.1)
Sodium: 141 mmol/L (ref 135–145)

## 2015-08-22 LAB — GLUCOSE, CAPILLARY
Glucose-Capillary: 73 mg/dL (ref 65–99)
Glucose-Capillary: 181 mg/dL — ABNORMAL HIGH (ref 65–99)
Glucose-Capillary: 73 mg/dL (ref 65–99)
Glucose-Capillary: 211 mg/dL — ABNORMAL HIGH (ref 65–99)

## 2015-08-22 LAB — OCCULT BLOOD X 1 CARD TO LAB, STOOL: Fecal Occult Bld: NEGATIVE

## 2015-08-22 MED ORDER — INSULIN DETEMIR 100 UNIT/ML ~~LOC~~ SOLN
5.0000 [IU] | Freq: Every day | SUBCUTANEOUS | Status: DC
  Administered 2015-08-22 – 2015-08-24 (×3): 5 [IU] via SUBCUTANEOUS
  Filled 2015-08-22 (×4): qty 0.05

## 2015-08-22 MED ORDER — POTASSIUM CHLORIDE CRYS ER 20 MEQ PO TBCR
40.0000 meq | EXTENDED_RELEASE_TABLET | Freq: Once | ORAL | Status: AC
  Administered 2015-08-22: 40 meq via ORAL
  Filled 2015-08-22: qty 2

## 2015-08-22 MED ORDER — LISINOPRIL 20 MG PO TABS
20.0000 mg | ORAL_TABLET | Freq: Every day | ORAL | Status: DC
  Administered 2015-08-23 – 2015-08-25 (×3): 20 mg via ORAL
  Filled 2015-08-22 (×3): qty 1

## 2015-08-22 NOTE — Progress Notes (Signed)
ICD tachyarrhythmia therapies are "off".

## 2015-08-22 NOTE — Progress Notes (Signed)
- 

## 2015-08-22 NOTE — Progress Notes (Signed)
CSW (Clinical Education officer, museum) spoke with [facility] and provided with update. Pt is able to dc back to SNF over the weekend if medically stable for dc.  [place], [place]

## 2015-08-22 NOTE — Progress Notes (Signed)
Occupational Therapy Treatment Patient Details Name: Troy Warren MRN: [id number] DOB: [birth year] Today's Date: 08/22/2015    History of present illness 81 y.o. 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 Admitted directly from office. Interrogation of his device today shows that the efficiency of biventricular pacing is down to only 70% (as opposed to 88% last year)   OT comments  Pt making progress with functional goals and should continue with acute OT services to increase level of function and safety  Follow Up Recommendations  SNF    Equipment Recommendations       Recommendations for Other Services      Precautions / Restrictions Precautions Precautions: Fall Precaution Comments: both from physical and cognitive deficits Restrictions Weight Bearing Restrictions: No       Mobility Bed Mobility Overal bed mobility: Modified Independent Bed Mobility: Supine to Sit     Supine to sit: Modified independent (Device/Increase time) Sit to supine: Supervision   General bed mobility comments: Pt HOB is elevated and he is using the railing to pull up.  Extra time needed to complete task, but he could do so without assistance.   Transfers Overall transfer level: Needs assistance Equipment used: Rolling walker (2 wheeled) Transfers: Sit to/from Stand Sit to Stand: Mod assist;Min assist         General transfer comment: Min assist for safety and balance during transition.  Up to mod assist from lower straight back chair.     Balance Overall balance assessment: Needs assistance Sitting-balance support: Feet supported Sitting balance-Leahy Scale: Fair     Standing balance support: Bilateral upper extremity supported;During functional activity Standing  balance-Leahy Scale: Poor                     ADL Overall ADL's : Needs assistance/impaired     Grooming: Wash/dry hands;Wash/dry face;Standing;Min guard;Minimal assistance   Upper Body Bathing: Set up;Supervision/ safety   Lower Body Bathing: Moderate assistance;Minimal assistance   Upper Body Dressing : Supervision/safety;Set up;Sitting       Toilet Transfer: Min guard;Ambulation;RW   Toileting- Clothing Manipulation and Hygiene: Minimal assistance                Vision  no change from baseline                   Perception Perception Perception Tested?: No   Praxis Praxis Praxis tested?: Not tested    Cognition   Behavior During Therapy: WFL for tasks assessed/performed Overall Cognitive Status: Within Functional Limits for tasks assessed       Memory: Decreased recall of precautions               Extremity/Trunk Assessment   generalized weakness                        General Comments  pt pleasant and cooperative    Pertinent Vitals/ Pain       Pain Assessment: 0-10 Faces Pain Scale: Hurts little more Pain Location: left foot while walking Pain Descriptors / Indicators: Grimacing Pain Intervention(s): Limited activity within patient's tolerance;Monitored during session;Repositioned (RN to bring pain meds)  Home Living  SNF, LTC resident  Prior Functioning/Environment  uses RW, assist/sup with ADLs           Frequency Min 2X/week     Progress Toward Goals  OT Goals(current goals can now be found in the care plan section)  Progress towards OT goals: Progressing toward goals  Acute Rehab OT Goals Patient Stated Goal: none stated OT Goal Formulation: With patient  Plan Discharge plan remains appropriate       End of Session Equipment Utilized During Treatment: Gait belt;Rolling walker   Activity Tolerance Patient limited by fatigue   Patient Left  in bed;with call bell/phone within reach;with bed alarm set;with family/visitor present   Nurse Communication          Time: 0761-5183 OT Time Calculation (min): 27 min  Charges: OT General Charges $OT Visit: 1 Procedure OT Treatments $Self Care/Home Management : 8-22 mins $Therapeutic Activity: 8-22 mins  Britt Bottom 08/22/2015, 1:05 PM

## 2015-08-22 NOTE — Progress Notes (Signed)
Physical Therapy Treatment Patient Details Name: Troy Warren MRN: [id number] DOB: 1931-04-06 Today's Date: 08/22/2015    History of Present Illness 81 y.o. 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 Admitted directly from office. Interrogation of his device today shows that the efficiency of biventricular pacing is down to only 70% (as opposed to 88% last year)    PT Comments    Pt was able to progress gait into the hallway today, based on last PT treatment note, we decided to keep O2 [place] on during session and O2 sats were in the low 90s at the end of gait.  Pt is generally weak and deconditioned with poor activity tolerance (although improving with every PT session).  PT will continue to follow acutely.  Follow Up Recommendations  SNF     Equipment Recommendations  None recommended by PT    Recommendations for Other Services   NA     Precautions / Restrictions Precautions Precautions: Fall Precaution Comments: both from physical and cognitive deficits    Mobility  Bed Mobility Overal bed mobility: Modified Independent Bed Mobility: Supine to Sit     Supine to sit: Modified independent (Device/Increase time)     General bed mobility comments: Pt HOB is elevated and he is using the railing to pull up.  Extra time needed to complete task, but he could do so without assistance.   Transfers Overall transfer level: Needs assistance Equipment used: Rolling walker (2 wheeled) Transfers: Sit to/from Stand Sit to Stand: Mod assist;Min assist         General transfer comment: Min assist for safety and balance during transition.  Up to mod assist from lower straight back chair.   Ambulation/Gait Ambulation/Gait assistance: Min assist Ambulation Distance (Feet): 75  Feet Assistive device: Rolling walker (2 wheeled) Gait Pattern/deviations: Step-through pattern;Shuffle;Trunk flexed Gait velocity: decreased   General Gait Details: Pt with shuffling gait pattern and reports of left foot pain with ambulation.  Verbal and manual assist to stay closer to RW.           Balance Overall balance assessment: Needs assistance Sitting-balance support: Feet supported;Bilateral upper extremity supported Sitting balance-Leahy Scale: Fair     Standing balance support: Bilateral upper extremity supported Standing balance-Leahy Scale: Poor                      Cognition Arousal/Alertness: Awake/alert Behavior During Therapy: WFL for tasks assessed/performed Overall Cognitive Status: History of cognitive impairments - at baseline                             Pertinent Vitals/Pain Pain Assessment: Faces Faces Pain Scale: Hurts little more Pain Location: left foot while walking Pain Descriptors / Indicators: Grimacing Pain Intervention(s): Limited activity within patient's tolerance;Monitored during session;Repositioned (RN to bring pain meds)           PT Goals (current goals can now be found in the care plan section) Acute Rehab PT Goals Patient Stated Goal: none stated Progress towards PT goals: Progressing toward goals    Frequency  Min 2X/week    PT Plan Current plan remains appropriate       End of Session Equipment Utilized During Treatment: Gait belt;Oxygen  Activity Tolerance: Patient limited by fatigue Patient left: in chair;with call bell/phone within reach;with chair alarm set;with nursing/sitter in room     Time: 8727-6184 PT Time Calculation (min) (ACUTE ONLY): 25 min  Charges:  $Gait Training: 8-22 mins $Therapeutic Activity: 8-22 mins                      Jefry Lesinski B. [place], [place], DPT [phone]   08/22/2015, 12:28 PM

## 2015-08-22 NOTE — Progress Notes (Signed)
Patient Name: Troy Warren Date of Encounter: 08/22/2015  Principal Problem:  Acute on chronic combined systolic and diastolic heart failure Active Problems:  Permanent atrial fibrillation  ICD, biventricular, in situ - Medtronic Protecta 2013, leads 2009  Hypertension associated with stage 3 chronic kidney disease due to type 2 diabetes mellitus  DM (diabetes mellitus) type II controlled with renal manifestation  Mixed Alzheimer's and vascular dementia   Primary Cardiologist: Dr Sallyanne Kuster  Patient Profile: 4 stage the combined systolic and diastolic heart failure secondary to ischemic cardiomyopathy , with a background of permanent atrial fibrillation and CRT-D Device therapy. Admitted 09/06 w/ CHF, anasarca, elevated HR  SUBJECTIVE: Awake, alert, confused. Denies chest pain or SOB. Has not been OOB.  OBJECTIVE Filed Vitals:   08/21/15 2038 08/22/15 0014 08/22/15 0521 08/22/15 0905  BP: 164/64 173/76  146/84  Pulse: 73     Temp: 98 F (36.7 C)  98.5 F (36.9 C)   TempSrc: Oral  Oral   Resp: 16     Weight:   175 lb 7.8 oz (79.6 kg)   SpO2: 96% 94%  97%    Intake/Output Summary (Last 24 hours) at 08/22/15 0942 Last data filed at 08/22/15 0800  Gross per 24 hour  Intake  360 ml  Output  1700 ml  Net -1340 ml   Filed Weights   08/20/15 0500 08/21/15 0400 08/22/15 0521  Weight: 183 lb 12.8 oz (83.371 kg) 184 lb 8.4 oz (83.7 kg) 175 lb 7.8 oz (79.6 kg)    PHYSICAL EXAM General: Well developed, well nourished, male in no acute distress. Head: Normocephalic, atraumatic. Neck: Supple without bruits, JVD 8 cm, + hepatojugular reflux Lungs: Resp regular and unlabored, decreased BS bases, . Heart: RRR, S1, S2, no S3, S4, or murmur; no rub. Abdomen: Soft, non-tender, non-distended, BS + x 4.  Extremities: No clubbing, cyanosis, edema.  Neuro: Alert and oriented X 3. Moves all extremities  spontaneously. Psych: Normal affect.  LABS: CBC:  Recent Labs (last 2 labs)      Recent Labs  08/19/15 1930 08/20/15 0426  WBC 7.7 7.6  HGB 13.0 13.0  HCT 41.9 41.3  MCV 92.7 92.4  PLT 149* 154     Basic Metabolic Panel:  Recent Labs (last 2 labs)      Recent Labs  08/21/15 0543 08/22/15 0529  NA 140 141  K 3.1* 3.4*  CL 101 100*  CO2 31 34*  GLUCOSE 53* 87  BUN 11 14  CREATININE 0.97 1.11  CALCIUM 9.0 8.9     Liver Function Tests:  Recent Labs (last 2 labs)      Recent Labs  08/19/15 1930  AST 15  ALT 7*  ALKPHOS 96  BILITOT 0.9  PROT 6.2*  ALBUMIN 3.1*      TELE: Afib, pacing most of the time but a high percentage of fusion beats.   Radiology/Studies: none   Current Medications:  . acetaminophen 650 mg Oral TID  . aspirin EC 81 mg Oral Daily  . citalopram 20 mg Oral Daily  . donepezil 23 mg Oral QHS  . dorzolamide-timolol 1 drop Both Eyes BID  . ferrous sulfate 325 mg Oral BID WC  . furosemide 40 mg Intravenous BID  . insulin aspart 0-15 Units Subcutaneous TID WC  . insulin aspart 0-5 Units Subcutaneous QHS  . insulin detemir 5 Units Subcutaneous QHS  . isosorbide mononitrate 90 mg Oral Daily  . levothyroxine 75 mcg Oral QAC breakfast  .  lisinopril 10 mg Oral Daily  . metoprolol tartrate 150 mg Oral BID  . potassium chloride 20 mEq Oral BID  . pravastatin 40 mg Oral q1800  . spironolactone 25 mg Oral Daily      ASSESSMENT AND PLAN: 1. Acute on chronic combined systolic and diastolic HF - Improved diuresis, continue furosemide and add spironolactone.  - Might be ready to switch to po loop diuretic.  - Weight is down now but still some JVD and decreased BS bases. MD advise on checking CXR - weight in May and July 2016 was 157 lbs  2. Permanent atrial fibrillation -  Rate controlled better on metoprolol, but still room for improvement.  - metoprolol dose increased 09/08 - further med adjustments per MD - with multiple fusion beats, MD advise if device needs to be adjusted  3. CAD s/p CABG - no ongoing ischemic sx  4. HTN - on nitrates, BB - MD advise on increasing lisinopril, could also add hydralazine.  5. Dementia, alzheimer's and vascular. - continue current rx  6. H/o ICM s/p Medtronic CRT-D Still need to review discontinuation of tachy therapy with family  7. Hypokalemia - give additional 40 meq x 1 today.  Principal Problem:  Acute on chronic combined systolic and diastolic heart failure Active Problems:  Permanent atrial fibrillation  ICD, biventricular, in situ - Medtronic Protecta 2013, leads 2009  Hypertension associated with stage 3 chronic kidney disease due to type 2 diabetes mellitus  DM (diabetes mellitus) type II controlled with renal manifestation  Mixed Alzheimer's and vascular dementia   Signed, Rosaria Ferries , PA-C 9:42 AM 08/22/2015  I have seen and examined the patient along with Barrett, Rhonda , PA-C. I have reviewed the chart, notes and new data. I agree with PA's note.  Key new complaints: pleasant, as always he has no complaints Key examination changes: edema is better, still significant; probably still 15-20 lb of extra fluid on board Key new findings / data: K better, still low, creat normal despite adding spironolactone  Continue IV diuretics over the weekend. Increase lisinopril  Discussed goals of care with the patient, wife and daughter. They do want want him to receive CPR/shoks and would like ICD shocks turned "off". Will leave CRT on.   Sanda Klein, MD, [place] [phone] 08/22/2015, 10:54 AM

## 2015-08-23 DIAGNOSIS — I5043 Acute on chronic combined systolic (congestive) and diastolic (congestive) heart failure: Principal | ICD-10-CM

## 2015-08-23 LAB — GLUCOSE, CAPILLARY
GLUCOSE-CAPILLARY: 146 mg/dL — AB (ref 65–99)
GLUCOSE-CAPILLARY: 185 mg/dL — AB (ref 65–99)
GLUCOSE-CAPILLARY: 146 mg/dL — AB (ref 65–99)
Glucose-Capillary: 59 mg/dL — ABNORMAL LOW (ref 65–99)
Glucose-Capillary: 108 mg/dL — ABNORMAL HIGH (ref 65–99)

## 2015-08-23 LAB — BASIC METABOLIC PANEL
Anion gap: 6 (ref 5–15)
BUN: 18 mg/dL (ref 6–20)
CHLORIDE: 100 mmol/L — AB (ref 101–111)
CO2: 32 mmol/L (ref 22–32)
Calcium: 8.8 mg/dL — ABNORMAL LOW (ref 8.9–10.3)
Creatinine, Ser: 0.96 mg/dL (ref 0.61–1.24)
GFR calc Af Amer: >60 mL/min (ref >60)
GFR calc non Af Amer: >60 mL/min (ref >60)
GLUCOSE: 83 mg/dL (ref 65–99)
POTASSIUM: 3.8 mmol/L (ref 3.5–5.1)
Sodium: 138 mmol/L (ref 135–145)

## 2015-08-23 MED ORDER — FUROSEMIDE 40 MG PO TABS
40.0000 mg | ORAL_TABLET | Freq: Two times a day (BID) | ORAL | Status: DC
  Administered 2015-08-23 – 2015-08-25 (×4): 40 mg via ORAL
  Filled 2015-08-23 (×4): qty 1

## 2015-08-23 NOTE — Progress Notes (Signed)
Assumed care from off going RN @ [phone]; patient alert oriented times 2; Blood sugar 59 POC; orange juice; peanut butter; breakfast; will recheck blood sugar; HR 78 paced on tele; bed larm in placed

## 2015-08-23 NOTE — Progress Notes (Signed)
Patient Name: Troy Warren Date of Encounter: 08/23/2015     Principal Problem:   Acute on chronic combined systolic and diastolic heart failure Active Problems:   Permanent atrial fibrillation   ICD, biventricular, in situ - Medtronic Protecta 2013, leads 2009   Hypertension associated with stage 3 chronic kidney disease due to type 2 diabetes mellitus   DM (diabetes mellitus) type II controlled with renal manifestation   Mixed Alzheimer's and vascular dementia    SUBJECTIVE  The patient states that his breathing is improved.  Denies any chest discomfort.  His weight is down 9 pounds yesterday, not weighed today yet.  CURRENT MEDS . acetaminophen  650 mg Oral TID  . aspirin EC  81 mg Oral Daily  . citalopram  20 mg Oral Daily  . donepezil  23 mg Oral QHS  . dorzolamide-timolol  1 drop Both Eyes BID  . ferrous sulfate  325 mg Oral BID WC  . furosemide  40 mg Intravenous BID  . insulin aspart  0-15 Units Subcutaneous TID WC  . insulin aspart  0-5 Units Subcutaneous QHS  . insulin detemir  5 Units Subcutaneous QHS  . isosorbide mononitrate  90 mg Oral Daily  . levothyroxine  75 mcg Oral QAC breakfast  . lisinopril  20 mg Oral Daily  . metoprolol tartrate  150 mg Oral BID  . potassium chloride  20 mEq Oral BID  . pravastatin  40 mg Oral q1800  . spironolactone  25 mg Oral Daily    OBJECTIVE  Filed Vitals:   08/22/15 2217 08/22/15 2222 08/23/15 0602 08/23/15 0700  BP: 167/89 167/89 156/66   Pulse: 76 76 75   Temp:   97.6 F (36.4 C)   TempSrc:   Oral   Resp: 21  20   Height:    5\' 7"  (1.702 m)  Weight:      SpO2: 98%  99%     Intake/Output Summary (Last 24 hours) at 08/23/15 1027 Last data filed at 08/23/15 0000  Gross per 24 hour  Intake    420 ml  Output    475 ml  Net    -55 ml   Filed Weights   08/20/15 0500 08/21/15 0400 08/22/15 0521  Weight: 183 lb 12.8 oz (83.371 kg) 184 lb 8.4 oz (83.7 kg) 175 lb 7.8 oz (79.6 kg)    PHYSICAL  EXAM  General: Pleasant, NAD. HEENT:  Normal  Neck: Supple without bruits or JVD. Lungs:  Resp regular and unlabored, CTA. Heart: RRR no s3, s4, or murmurs. Abdomen: Soft, non-tender, non-distended, BS + x 4.  Extremities: Trace chronic edema  Accessory Clinical Findings  CBC No results for input(s): WBC, NEUTROABS, HGB, HCT, MCV, PLT in the last 72 hours. Basic Metabolic Panel  Recent Labs  08/22/15 0529 08/23/15 0358  NA 141 138  K 3.4* 3.8  CL 100* 100*  CO2 34* 32  GLUCOSE 87 83  BUN 14 18  CREATININE 1.11 0.96  CALCIUM 8.9 8.8*   Liver Function Tests No results for input(s): AST, ALT, ALKPHOS, BILITOT, PROT, ALBUMIN in the last 72 hours. No results for input(s): LIPASE, AMYLASE in the last 72 hours. Cardiac Enzymes No results for input(s): CKTOTAL, CKMB, CKMBINDEX, TROPONINI in the last 72 hours. BNP Invalid input(s): POCBNP D-Dimer No results for input(s): DDIMER in the last 72 hours. Hemoglobin A1C No results for input(s): HGBA1C in the last 72 hours. Fasting Lipid Panel No results for input(s): CHOL, HDL, LDLCALC,  TRIG, CHOLHDL, LDLDIRECT in the last 72 hours. Thyroid Function Tests No results for input(s): TSH, T4TOTAL, T3FREE, THYROIDAB in the last 72 hours.  Invalid input(s): FREET3  TELE  Predominantly V paced rhythm.  Underlying atrial fibrillation  ECG    Radiology/Studies  No results found.  ASSESSMENT AND PLAN  1. Acute on chronic combined systolic and diastolic HF Continue spironolactone.  Switch to by mouth Lasix today  2. Permanent atrial fibrillation Rate controlled better on metoprolol.  3. CAD s/p CABG  4. HTN  5. Dementia, alzheimer's and vascular.  6. H/o ICM s/p Medtronic CRT-D Tachycardia therapies were discontinued by Dr. Sallyanne Kuster    yesterday after discussion with the family.  Plan: We will switch to oral Lasix today. Signed, Darlin Coco MD

## 2015-08-24 LAB — BASIC METABOLIC PANEL
Anion gap: 6 (ref 5–15)
BUN: 21 mg/dL — ABNORMAL HIGH (ref 6–20)
CHLORIDE: 103 mmol/L (ref 101–111)
CO2: 31 mmol/L (ref 22–32)
CREATININE: 1.05 mg/dL (ref 0.61–1.24)
Calcium: 9 mg/dL (ref 8.9–10.3)
GFR calc non Af Amer: >60 mL/min (ref >60)
Glucose, Bld: 156 mg/dL — ABNORMAL HIGH (ref 65–99)
Potassium: 4 mmol/L (ref 3.5–5.1)
Sodium: 140 mmol/L (ref 135–145)

## 2015-08-24 LAB — GLUCOSE, CAPILLARY
GLUCOSE-CAPILLARY: 181 mg/dL — AB (ref 65–99)
GLUCOSE-CAPILLARY: 87 mg/dL (ref 65–99)
Glucose-Capillary: 101 mg/dL — ABNORMAL HIGH (ref 65–99)
Glucose-Capillary: 225 mg/dL — ABNORMAL HIGH (ref 65–99)

## 2015-08-24 NOTE — Progress Notes (Signed)
Patient Name: Troy Warren Date of Encounter: 08/24/2015     Principal Problem:   Acute on chronic combined systolic and diastolic heart failure Active Problems:   Permanent atrial fibrillation   ICD, biventricular, in situ - Medtronic Protecta 2013, leads 2009   Hypertension associated with stage 3 chronic kidney disease due to type 2 diabetes mellitus   DM (diabetes mellitus) type II controlled with renal manifestation   Mixed Alzheimer's and vascular dementia    SUBJECTIVE  The patient is lying relatively flat.  Denies any chest pain or dyspnea.  Remains confused.  CURRENT MEDS . acetaminophen  650 mg Oral TID  . aspirin EC  81 mg Oral Daily  . citalopram  20 mg Oral Daily  . donepezil  23 mg Oral QHS  . dorzolamide-timolol  1 drop Both Eyes BID  . ferrous sulfate  325 mg Oral BID WC  . furosemide  40 mg Oral BID  . insulin aspart  0-15 Units Subcutaneous TID WC  . insulin aspart  0-5 Units Subcutaneous QHS  . insulin detemir  5 Units Subcutaneous QHS  . isosorbide mononitrate  90 mg Oral Daily  . levothyroxine  75 mcg Oral QAC breakfast  . lisinopril  20 mg Oral Daily  . metoprolol tartrate  150 mg Oral BID  . potassium chloride  20 mEq Oral BID  . pravastatin  40 mg Oral q1800  . spironolactone  25 mg Oral Daily    OBJECTIVE  Filed Vitals:   08/23/15 2040 08/23/15 2117 08/24/15 0430 08/24/15 0621  BP: 143/60 143/60 150/65   Pulse: 88 72 71   Temp: 98.9 F (37.2 C)  98.4 F (36.9 C)   TempSrc: Oral  Oral   Resp: 22  21   Height:      Weight:    164 lb 11.2 oz (74.707 kg)  SpO2: 91%  98%     Intake/Output Summary (Last 24 hours) at 08/24/15 1033 Last data filed at 08/24/15 0900  Gross per 24 hour  Intake    700 ml  Output    850 ml  Net   -150 ml   Filed Weights   08/21/15 0400 08/22/15 0521 08/24/15 0621  Weight: 184 lb 8.4 oz (83.7 kg) 175 lb 7.8 oz (79.6 kg) 164 lb 11.2 oz (74.707 kg)    PHYSICAL EXAM  General: Pleasant, NAD. Neuro:  Alert and oriented X 3. Moves all extremities spontaneously. Psych: Normal affect. HEENT:  Normal  Neck: Supple without bruits or JVD. Lungs:  Resp regular and unlabored, CTA. Heart: Slightly irregular, paced.  no s3, s4, or murmurs. Abdomen: Soft, non-tender, non-distended, BS + x 4.  Extremities: Trace pedal edema.  Accessory Clinical Findings  CBC No results for input(s): WBC, NEUTROABS, HGB, HCT, MCV, PLT in the last 72 hours. Basic Metabolic Panel  Recent Labs  08/23/15 0358 08/24/15 0338  NA 138 140  K 3.8 4.0  CL 100* 103  CO2 32 31  GLUCOSE 83 156*  BUN 18 21*  CREATININE 0.96 1.05  CALCIUM 8.8* 9.0   Liver Function Tests No results for input(s): AST, ALT, ALKPHOS, BILITOT, PROT, ALBUMIN in the last 72 hours. No results for input(s): LIPASE, AMYLASE in the last 72 hours. Cardiac Enzymes No results for input(s): CKTOTAL, CKMB, CKMBINDEX, TROPONINI in the last 72 hours. BNP Invalid input(s): POCBNP D-Dimer No results for input(s): DDIMER in the last 72 hours. Hemoglobin A1C No results for input(s): HGBA1C in the last 72  hours. Fasting Lipid Panel No results for input(s): CHOL, HDL, LDLCALC, TRIG, CHOLHDL, LDLDIRECT in the last 72 hours. Thyroid Function Tests No results for input(s): TSH, T4TOTAL, T3FREE, THYROIDAB in the last 72 hours.  Invalid input(s): FREET3  TELE  Ventricular paced rhythm.  Underlying atrial fibrillation  ECG    Radiology/Studies  No results found.  ASSESSMENT AND PLAN 1. Acute on chronic combined systolic and diastolic HF Continue spironolactone. Now on Lasix 40 mg twice a day.  Kidney function stable so far.  Approaching dry weight.  At home he was taking furosemide once a day  2. Permanent atrial fibrillation Rate controlled better on metoprolol.  3. CAD s/p CABG  4. HTN  5. Dementia, alzheimer's and vascular.  6. H/o ICM s/p Medtronic CRT-D  Plan: Continue physical therapy.  Continue attempts to mobilize.   Possible discharge back to skilled nursing facility Monday. Signed, Darlin Coco MD

## 2015-08-25 DIAGNOSIS — E1122 Type 2 diabetes mellitus with diabetic chronic kidney disease: Secondary | ICD-10-CM

## 2015-08-25 DIAGNOSIS — N183 Chronic kidney disease, stage 3 (moderate): Secondary | ICD-10-CM

## 2015-08-25 DIAGNOSIS — I129 Hypertensive chronic kidney disease with stage 1 through stage 4 chronic kidney disease, or unspecified chronic kidney disease: Secondary | ICD-10-CM

## 2015-08-25 DIAGNOSIS — Z95810 Presence of automatic (implantable) cardiac defibrillator: Secondary | ICD-10-CM

## 2015-08-25 DIAGNOSIS — I482 Chronic atrial fibrillation: Secondary | ICD-10-CM

## 2015-08-25 LAB — BASIC METABOLIC PANEL
ANION GAP: 8 (ref 5–15)
BUN: 21 mg/dL — ABNORMAL HIGH (ref 6–20)
CO2: 29 mmol/L (ref 22–32)
Calcium: 8.9 mg/dL (ref 8.9–10.3)
Chloride: 103 mmol/L (ref 101–111)
Creatinine, Ser: 1 mg/dL (ref 0.61–1.24)
Glucose, Bld: 87 mg/dL (ref 65–99)
POTASSIUM: 3.7 mmol/L (ref 3.5–5.1)
SODIUM: 140 mmol/L (ref 135–145)

## 2015-08-25 LAB — GLUCOSE, CAPILLARY
GLUCOSE-CAPILLARY: 82 mg/dL (ref 65–99)
Glucose-Capillary: 124 mg/dL — ABNORMAL HIGH (ref 65–99)

## 2015-08-25 MED ORDER — SPIRONOLACTONE 25 MG PO TABS: 25.0000 mg | ORAL_TABLET | Freq: Every day | ORAL | Status: AC

## 2015-08-25 MED ORDER — FUROSEMIDE 40 MG PO TABS: 40.0000 mg | ORAL_TABLET | Freq: Two times a day (BID) | ORAL | Status: AC

## 2015-08-25 MED ORDER — LISINOPRIL 20 MG PO TABS: 20.0000 mg | ORAL_TABLET | Freq: Every day | ORAL | Status: AC

## 2015-08-25 MED ORDER — LEVALBUTEROL HCL 0.63 MG/3ML IN NEBU: 0.6300 mg | INHALATION_SOLUTION | Freq: Four times a day (QID) | RESPIRATORY_TRACT | Status: AC | PRN

## 2015-08-25 MED ORDER — INSULIN DETEMIR 100 UNIT/ML ~~LOC~~ SOLN: 5.0000 [IU] | Freq: Every day | SUBCUTANEOUS | Status: AC

## 2015-08-25 MED ORDER — METOPROLOL TARTRATE 75 MG PO TABS: 150.0000 mg | ORAL_TABLET | Freq: Two times a day (BID) | ORAL | Status: AC

## 2015-08-25 MED ORDER — ACETAMINOPHEN 325 MG PO TABS: 650.0000 mg | ORAL_TABLET | Freq: Three times a day (TID) | ORAL | Status: AC

## 2015-08-25 NOTE — Progress Notes (Addendum)
CSW (Clinical Education officer, museum) received renewed insurance authorization and provided to facility. CSW spoke with pt wife who confirmed she would like CSW to call Gwenette Greet with [facility] to get pt back to facility. CSW scheduled transport (wheelchair Lucianne Lei) at [facility]. Pt nurse aware and will make sure pt dc packet goes with pt. CSW signing off.  [place], [place]

## 2015-08-25 NOTE — Progress Notes (Signed)
   SUBJECTIVE:  No complaints today.  Pleasantly demented  OBJECTIVE:   Vitals:   Filed Vitals:   08/24/15 0621 08/24/15 1438 08/24/15 2100 08/25/15 0435  BP:  164/74 148/71 145/67  Pulse:  77 80 68  Temp:  97.8 F (36.6 C) 99.3 F (37.4 C) 98.6 F (37 C)  TempSrc:  Oral  Oral  Resp:  20 16 18   Height:      Weight: 164 lb 11.2 oz (74.707 kg)   163 lb 6.4 oz (74.118 kg)  SpO2:  94% 99% 94%   I&O's:   Intake/Output Summary (Last 24 hours) at 08/25/15 0847 Last data filed at 08/24/15 2200  Gross per 24 hour  Intake    700 ml  Output      0 ml  Net    700 ml   TELEMETRY: Reviewed telemetry pt in atrial fibrillation with V pacing:     PHYSICAL EXAM General: Well developed, well nourished, in no acute distress Head: Eyes PERRLA, No xanthomas.   Normal cephalic and atramatic  Lungs:   Clear bilaterally to auscultation and percussion. Heart:   HRRR S1 S2 Pulses are 2+ & equal. Abdomen: Bowel sounds are positive, abdomen soft and non-tender without masses  Extremities:   No clubbing, cyanosis or edema.  DP +1 Neuro: Alert and but demented    LABS: Basic Metabolic Panel:  Recent Labs  08/24/15 0338 08/25/15 0352  NA 140 140  K 4.0 3.7  CL 103 103  CO2 31 29  GLUCOSE 156* 87  BUN 21* 21*  CREATININE 1.05 1.00  CALCIUM 9.0 8.9   Liver Function Tests: No results for input(s): AST, ALT, ALKPHOS, BILITOT, PROT, ALBUMIN in the last 72 hours. No results for input(s): LIPASE, AMYLASE in the last 72 hours. CBC: No results for input(s): WBC, NEUTROABS, HGB, HCT, MCV, PLT in the last 72 hours. Cardiac Enzymes: No results for input(s): CKTOTAL, CKMB, CKMBINDEX, TROPONINI in the last 72 hours. BNP: Invalid input(s): POCBNP D-Dimer: No results for input(s): DDIMER in the last 72 hours. Hemoglobin A1C: No results for input(s): HGBA1C in the last 72 hours. Fasting Lipid Panel: No results for input(s): CHOL, HDL, LDLCALC, TRIG, CHOLHDL, LDLDIRECT in the last 72  hours. Thyroid Function Tests: No results for input(s): TSH, T4TOTAL, T3FREE, THYROIDAB in the last 72 hours.  Invalid input(s): FREET3 Anemia Panel: No results for input(s): VITAMINB12, FOLATE, FERRITIN, TIBC, IRON, RETICCTPCT in the last 72 hours. Coag Panel:   Lab Results  Component Value Date   INR 1.25 04/07/2015   INR 1.13 03/24/2014   INR 1.13 03/18/2014    RADIOLOGY: No results found.  ASSESSMENT AND PLAN 1. Acute on chronic combined systolic and diastolic HF - Net neg 5.9D Continue spironolactone. Now on Lasix 40 mg twice a day. Kidney function stable so far. Weight today 163lbs and appears at dry weight at this time. At home he was taking furosemide once a day but will send out on BID dosing.  He has no LE edema and his lungs are clear  2. Permanent atrial fibrillation Rate controlled better on metoprolol. Not a candidate for anticoagulation due to history of intracranial bleeding  3. CAD s/p CABG Continue ASA, long acting nitrates, BB, statin  4. HTN  Controlled on ACE I, BB, aldactone  5. Dementia, alzheimer's and vascular.  6. H/o ICM s/p Medtronic CRT-D    Sueanne Margarita, MD  08/25/2015  8:47 AM

## 2015-08-25 NOTE — Care Management Important Message (Signed)
Important Message  Patient Details  Name: Troy Warren MRN: [id number] Date of Birth: December 01, 1931   Medicare Important Message Given:  Yes-third notification given    Nathen May 08/25/2015, 1:07 [place] Message  Patient Details  Name: Troy Warren MRN: [id number] Date of Birth: Jul 11, 1931   Medicare Important Message Given:  Yes-third notification given    Nathen May 08/25/2015, 1:07 PM

## 2015-08-25 NOTE — Progress Notes (Signed)
Attempted to call report to [facility].  Will try again within 30 minutes.

## 2015-08-25 NOTE — Progress Notes (Signed)
CSW (Clinical Education officer, museum) notified pt is ready for dc today. CSW updated facility and confirmed they can accept pt today.   [place], [place]

## 2015-08-25 NOTE — Discharge Instructions (Signed)
Weigh daily Call [phone] if weight climbs more than 3 pounds in a day or 5 pounds in a week. No salt to very little salt in your diet.  No more than 2000 mg in a day. Also diabetic diet. Call if increased shortness of breath or increased swelling.   If weight continues to drop may need to decrease lasix.  BMP later this week or first of next.

## 2015-08-25 NOTE — Discharge Summary (Signed)
Physician Discharge Summary       Patient ID: Troy Warren MRN: [id number] DOB/AGE: [birth year] 81 y.o.  Admit date: 08/19/2015 Discharge date: 08/25/2015 Primary Cardiologist:Dr. Croitoru   Discharge Diagnoses:  Principal Problem:   Acute on chronic combined systolic and diastolic heart failure Active Problems:   Permanent atrial fibrillation   ICD, biventricular, in situ - Medtronic Protecta 2013, leads 2009, defib portion has been turned to off   Hypertension associated with stage 3 chronic kidney disease due to type 2 diabetes mellitus   DM (diabetes mellitus) type II controlled with renal manifestation   Mixed Alzheimer's and vascular dementia   Discharged Condition: fair  Procedures: none  Hospital Course: 81 year old male was seen in the office by Dr. Jerilynn Mages. Croitoru on 9/616 and found to be in acute on chronic combined systolic and diastolic HF with anasarca.  Dr. Jerilynn Mages. Croitoru thought the pt was 25-30 lbs overloaded with fluid.   With his hx of severe ischemic cardiomyopathy there was concern that  his current decompensation could be the significant reduction in the percentage of biventricular pacing due to atrial fibrillation with poorly controlled rate.  He has permanent atrial fib and not a candidate for anticoagulation  Due to hx of intracranial bleeding.  CAD s/p CABG but no coronary insufficiency on this visit.  His HTN was poorly controlled on admit.   He has medtronic protecta CRT-D.  He lives at [facility] and has Dementia.    During his stay, he was diuresed with lasix 40 mg IV BID and is negative 5,835.  His wt is down from 184 to 163 lbs.  He is now on po lasix at higher dose than his usual dose.  But we will leave on lasix 40 mg po BID.  He has no edema and lungs are clear at discharge.   His ICD (defibrillator) tachyarrhythmia therapies are "off", the BIV pacer will continue to pace him.  He remains in a fib with pacing.  He has had episodes of hypoglycemia during  the stay. We decreased his Levemir to 5 units HS, this may need to be increased as outpatient.  We stopped cardizem and labetalol. New meds on med list.  If wt continues to decrease may need to decrease lasix.   Pt will follow up in Dr. Jerilynn Mages. Croitoru's office in 1 week or so.     PT evaluated and recommend PT therapy at SNF.    BMP Thursday.    Consults: cardiology  Significant Diagnostic Studies:  BMP Latest Ref Rng 08/25/2015 08/24/2015 08/23/2015  Glucose 65 - 99 mg/dL 87 156(H) 83  BUN 6 - 20 mg/dL 21(H) 21(H) 18  Creatinine 0.61 - 1.24 mg/dL 1.00 1.05 0.96  Sodium 135 - 145 mmol/L 140 140 138  Potassium 3.5 - 5.1 mmol/L 3.7 4.0 3.8  Chloride 101 - 111 mmol/L 103 103 100(L)  CO2 22 - 32 mmol/L 29 31 32  Calcium 8.9 - 10.3 mg/dL 8.9 9.0 8.8(L)   CBC Latest Ref Rng 08/20/2015 08/19/2015 07/21/2015  WBC 4.0 - 10.5 K/uL 7.6 7.7 8.3  Hemoglobin 13.0 - 17.0 g/dL 13.0 13.0 12.4(A)  Hematocrit 39.0 - 52.0 % 41.3 41.9 37(A)  Platelets 150 - 400 K/uL 154 149(L) 150   C diff negative.       Discharge Exam: Blood pressure 145/67, pulse 68, temperature 98.6 F (37 C), temperature source Oral, resp. rate 18, height 5\' 7"  (1.702 m), weight 163 lb 6.4 oz (74.118 kg), SpO2  94 %.   Disposition: SNF     Medication List    STOP taking these medications        diltiazem 120 MG tablet  Commonly known as:  CARDIZEM     labetalol 200 MG tablet  Commonly known as:  NORMODYNE      TAKE these medications        acetaminophen 325 MG tablet  Commonly known as:  TYLENOL  Take 2 tablets (650 mg total) by mouth 3 (three) times daily.     aspirin 81 MG tablet  Take 81 mg by mouth daily.     bisacodyl 10 MG suppository  Commonly known as:  DULCOLAX  Place 1 suppository (10 mg total) rectally daily as needed for moderate constipation.     citalopram 20 MG tablet  Commonly known as:  CELEXA  Take 20 mg by mouth daily.     COSOPT PF 22.3-6.8 MG/ML Soln  Generic drug:  Dorzolamide HCl-Timolol  Mal PF  Apply 1 drop to eye 2 (two) times daily.     donepezil 23 MG Tabs tablet  Commonly known as:  ARICEPT  Take 1 tablet (23 mg total) by mouth at bedtime.     Ferrous Fumarate 324 (106 FE) MG Tabs  Take 324 mg by mouth daily.     furosemide 40 MG tablet  Commonly known as:  LASIX  Take 1 tablet (40 mg total) by mouth 2 (two) times daily.     HYDROcodone-acetaminophen 5-325 MG per tablet  Commonly known as:  NORCO/VICODIN  Take one tablet by mouth every four hours as needed for pain. Do not exceed 4grams of Tylenol in 24 hours     IMDUR 30 MG 24 hr tablet  Generic drug:  isosorbide mononitrate  Take 90 mg by mouth daily. For Hypertension     insulin detemir 100 UNIT/ML injection  Commonly known as:  LEVEMIR  Inject 0.05 mLs (5 Units total) into the skin at bedtime.     insulin lispro 100 UNIT/ML injection  Commonly known as:  HUMALOG  Check blood sugar three times a day before meals with sliding scale 5 units for cbg 250-350 8 units for cbg >350-450 10 units for cbg>450     KLOR-CON M10 10 MEQ tablet  Generic drug:  potassium chloride  TAKE 1 TABLET TWICE DAILY     levalbuterol 0.63 MG/3ML nebulizer solution  Commonly known as:  XOPENEX  Take 3 mLs (0.63 mg total) by nebulization every 6 (six) hours as needed for wheezing or shortness of breath.     levothyroxine 75 MCG tablet  Commonly known as:  SYNTHROID, LEVOTHROID  TAKE 1 TABLET EVERY DAY     linagliptin 5 MG Tabs tablet  Commonly known as:  TRADJENTA  Take 5 mg by mouth daily.     lisinopril 20 MG tablet  Commonly known as:  PRINIVIL,ZESTRIL  Take 1 tablet (20 mg total) by mouth daily.     loratadine 10 MG tablet  Commonly known as:  CLARITIN  Take 10 mg by mouth daily.     LORazepam 0.5 MG tablet  Commonly known as:  ATIVAN  Take one tablet by mouth at bedtime for anxiety     Metoprolol Tartrate 75 MG Tabs  Take 150 mg by mouth 2 (two) times daily.     pravastatin 40 MG tablet  Commonly known  as:  PRAVACHOL  TAKE 1 TABLET EVERY DAY FOR CHOLESTEROL     spironolactone 25 MG  tablet  Commonly known as:  ALDACTONE  Take 1 tablet (25 mg total) by mouth daily.     traMADol 50 MG tablet  Commonly known as:  ULTRAM  Take one tablet by mouth twice daily for pain     VITAMIN B-12 PO  Take 1,000 mcg by mouth daily.     Vitamin D3 2000 UNITS Tabs  Take 2 tablets by mouth daily.       Follow-up Information    Follow up with Erlene Quan, PA-C On 09/09/2015.   Specialties:  Cardiology, Radiology   Why:  at 10:30 AM-  Dr. Victorino December PA   Contact information:   [address] [place] [address] [phone]        Discharge Instructions: Weigh daily Call [phone] if weight climbs more than 3 pounds in a day or 5 pounds in a week. No salt to very little salt in your diet.  No more than 2000 mg in a day. Also diabetic diet. Call if increased shortness of breath or increased swelling.   If weight continues to drop may need to decrease lasix.  BMP later this week or first of next.    Signed: Isaiah Serge Nurse Practitioner-Certified [facility]: [facility] 08/25/2015, 11:55 AM  Time spent on discharge : > 35 minutes.

## 2015-08-26 ENCOUNTER — Non-Acute Institutional Stay (SKILLED_NURSING_FACILITY): Payer: Medicare Other | Admitting: Internal Medicine

## 2015-08-26 ENCOUNTER — Telehealth: Payer: Self-pay | Admitting: Cardiovascular Disease

## 2015-08-26 DIAGNOSIS — N183 Chronic kidney disease, stage 3 unspecified: Secondary | ICD-10-CM

## 2015-08-26 DIAGNOSIS — F0280 Dementia in other diseases classified elsewhere without behavioral disturbance: Secondary | ICD-10-CM

## 2015-08-26 DIAGNOSIS — F0150 Vascular dementia without behavioral disturbance: Secondary | ICD-10-CM

## 2015-08-26 DIAGNOSIS — E1129 Type 2 diabetes mellitus with other diabetic kidney complication: Secondary | ICD-10-CM

## 2015-08-26 DIAGNOSIS — E039 Hypothyroidism, unspecified: Secondary | ICD-10-CM | POA: Diagnosis not present

## 2015-08-26 DIAGNOSIS — S72141S Displaced intertrochanteric fracture of right femur, sequela: Secondary | ICD-10-CM

## 2015-08-26 DIAGNOSIS — E46 Unspecified protein-calorie malnutrition: Secondary | ICD-10-CM | POA: Diagnosis not present

## 2015-08-26 DIAGNOSIS — E1122 Type 2 diabetes mellitus with diabetic chronic kidney disease: Secondary | ICD-10-CM | POA: Diagnosis not present

## 2015-08-26 DIAGNOSIS — D509 Iron deficiency anemia, unspecified: Secondary | ICD-10-CM

## 2015-08-26 DIAGNOSIS — I482 Chronic atrial fibrillation: Secondary | ICD-10-CM

## 2015-08-26 DIAGNOSIS — I5042 Chronic combined systolic (congestive) and diastolic (congestive) heart failure: Secondary | ICD-10-CM | POA: Diagnosis not present

## 2015-08-26 DIAGNOSIS — I4821 Permanent atrial fibrillation: Secondary | ICD-10-CM

## 2015-08-26 DIAGNOSIS — N058 Unspecified nephritic syndrome with other morphologic changes: Secondary | ICD-10-CM

## 2015-08-26 DIAGNOSIS — G309 Alzheimer's disease, unspecified: Secondary | ICD-10-CM

## 2015-08-26 DIAGNOSIS — I129 Hypertensive chronic kidney disease with stage 1 through stage 4 chronic kidney disease, or unspecified chronic kidney disease: Secondary | ICD-10-CM

## 2015-08-26 DIAGNOSIS — E785 Hyperlipidemia, unspecified: Secondary | ICD-10-CM

## 2015-08-26 DIAGNOSIS — R5381 Other malaise: Secondary | ICD-10-CM | POA: Diagnosis not present

## 2015-08-26 NOTE — Telephone Encounter (Signed)
D/C phone call .Marland Kitchen Appt on 09/09/15 at 10:30am w/ Kerin Ransom at [facility] office..  Thanks

## 2015-08-26 NOTE — Progress Notes (Signed)
Patient ID: Troy Warren, male   DOB: [birth year], 81 y.o.   MRN: [id number]     Facility: [facility]    PCP: Blanchie Serve, MD  Code Status: full code  Allergies  Allergen Reactions  . Lipitor [Atorvastatin]     weakness    Chief Complaint  Patient presents with  . Readmit To SNF     HPI:  81 y.o. patient is here for long term care post hospital admission from 08/19/15-08/25/15 with acute on chronic systolic and diastolic heart failure. He responded well to diuresis with iv lasix and diuresed close to 20 lbs. He was switched to lasix 40 mg po bid, his levemir dosing was adjusted with hypoglycemia. His AV nodal blocking agent cardizem and labetalol were stopped. He is seen in his room today. He minimally participates in HPI and ROS.   Review of Systems:  Constitutional: Negative for fever, chills HENT: Negative for headache Respiratory: Negative for cough.   Cardiovascular: Negative for chest pain  Gastrointestinal: Negative for heartburn, nausea, vomiting, abdominal pain  Musculoskeletal: using walker, no falls reported   Past Medical History  Diagnosis Date  . Hypertension   . Ischemic heart disease   . Hyperlipemia   . Depression   . Testicular cancer   . Polio     child polio  . Rheumatic fever   . Mild dementia   . Mild dementia   . Systolic heart failure     acute on chronic  . CAD (coronary artery disease)   . Chronic atrial fibrillation   . Vitamin D deficiency   . Type II or unspecified type diabetes mellitus without mention of complication, not stated as uncontrolled   . Other testicular hypofunction   . Permanent atrial fibrillation 01/21/2014  . ICD, biventricular, in situ - Medtronic Protecta 2013, leads 2009 01/21/2014    New Generator Medtronic [place] model number [id number], serial number [id number]  Right atrial lead (chronic) Medtronic, model number [id number], serial [id number] (implanted 05/01/2008)  Right ventricular  lead (chronic) Medtronic, model number [id number], serial number [id number] (implanted 05/01/2008)  Left ventricular lead Medtronic, model number [id number], serial number [id number] (implanted 05/01/2008)  Exp  . Pleural effusion, right 03/18/2014  . Emphysema 03/18/2014  . Pulmonary infiltrate 05/10/2014    CT chest 03/2014:  RUL interstitial/GGO    Past Surgical History  Procedure Laterality Date  . Coronary artery bypass graft  06/26/2002    LIMA to diagonal artery & SVG to the first obtuse margin  . Nm myocar perf wall motion  09/15/2006    Dilated LV w/small area of possible nontransmural inferior scar w/mild perinfarct ischemia.  No significant change from previous study.  . Cardiac defibrillator placement  05/01/2008    Medtronic  . Biv icd genertaor change out N/A 10/27/2012    Procedure: BIV ICD GENERTAOR CHANGE OUT;  Surgeon: Sanda Klein, MD;  Location: [facility] CATH LAB;  Service: Cardiovascular;  Laterality: N/A;  . Intramedullary (im) nail intertrochanteric Right 04/07/2015    Procedure: INTRAMEDULLARY (IM) NAIL INTERTROCHANTRIC;  Surgeon: Netta Cedars, MD;  Location: [place];  Service: Orthopedics;  Laterality: Right;   Social History:   reports that he quit smoking about 46 years ago. He has never used smokeless tobacco. He reports that he does not drink alcohol or use illicit drugs.  Family History  Problem Relation Age of Onset  . Other Mother   . Heart attack Father     Medications:   Medication List  This list is accurate as of: 08/26/15 11:59 PM.  Always use your most recent med list.               acetaminophen 325 MG tablet  Commonly known as:  TYLENOL  Take 2 tablets (650 mg total) by mouth 3 (three) times daily.     aspirin 81 MG tablet  Take 81 mg by mouth daily.     bisacodyl 10 MG suppository  Commonly known as:  DULCOLAX  Place 1 suppository (10 mg total) rectally daily as needed for moderate constipation.     citalopram 20 MG tablet  Commonly known as:  CELEXA    Take 20 mg by mouth daily.     COSOPT PF 22.3-6.8 MG/ML Soln  Generic drug:  Dorzolamide HCl-Timolol Mal PF  Apply 1 drop to eye 2 (two) times daily.     donepezil 23 MG Tabs tablet  Commonly known as:  ARICEPT  Take 1 tablet (23 mg total) by mouth at bedtime.     Ferrous Fumarate 324 (106 FE) MG Tabs  Take 324 mg by mouth daily.     furosemide 40 MG tablet  Commonly known as:  LASIX  Take 1 tablet (40 mg total) by mouth 2 (two) times daily.     HYDROcodone-acetaminophen 5-325 MG per tablet  Commonly known as:  NORCO/VICODIN  Take one tablet by mouth every four hours as needed for pain. Do not exceed 4grams of Tylenol in 24 hours     IMDUR 30 MG 24 hr tablet  Generic drug:  isosorbide mononitrate  Take 90 mg by mouth daily. For Hypertension     insulin detemir 100 UNIT/ML injection  Commonly known as:  LEVEMIR  Inject 0.05 mLs (5 Units total) into the skin at bedtime.     insulin lispro 100 UNIT/ML injection  Commonly known as:  HUMALOG  Check blood sugar three times a day before meals with sliding scale 5 units for cbg 250-350 8 units for cbg >350-450 10 units for cbg>450     KLOR-CON M10 10 MEQ tablet  Generic drug:  potassium chloride  TAKE 1 TABLET TWICE DAILY     levalbuterol 0.63 MG/3ML nebulizer solution  Commonly known as:  XOPENEX  Take 3 mLs (0.63 mg total) by nebulization every 6 (six) hours as needed for wheezing or shortness of breath.     levothyroxine 75 MCG tablet  Commonly known as:  SYNTHROID, LEVOTHROID  TAKE 1 TABLET EVERY DAY     linagliptin 5 MG Tabs tablet  Commonly known as:  TRADJENTA  Take 5 mg by mouth daily.     lisinopril 20 MG tablet  Commonly known as:  PRINIVIL,ZESTRIL  Take 1 tablet (20 mg total) by mouth daily.     loratadine 10 MG tablet  Commonly known as:  CLARITIN  Take 10 mg by mouth daily.     LORazepam 0.5 MG tablet  Commonly known as:  ATIVAN  Take one tablet by mouth at bedtime for anxiety     Metoprolol  Tartrate 75 MG Tabs  Take 150 mg by mouth 2 (two) times daily.     pravastatin 40 MG tablet  Commonly known as:  PRAVACHOL  TAKE 1 TABLET EVERY DAY FOR CHOLESTEROL     spironolactone 25 MG tablet  Commonly known as:  ALDACTONE  Take 1 tablet (25 mg total) by mouth daily.     traMADol 50 MG tablet  Commonly known as:  ULTRAM  Take  one tablet by mouth twice daily for pain     VITAMIN B-12 PO  Take 1,000 mcg by mouth daily.     Vitamin D3 2000 UNITS Tabs  Take 2 tablets by mouth daily.         Physical Exam: Filed Vitals:   08/26/15 1502  BP: 135/88  Pulse: 76  Temp: 98.3 F (36.8 C)  Resp: 18  SpO2: 97%    General- elderly male, in no acute distress Head- normocephalic, atraumatic Nose- no maxillary or frontal sinus tenderness, no nasal discharge Throat- moist mucus membrane Eyes- no pallor, no icterus, no discharge, normal conjunctiva, normal sclera Neck- no cervical lymphadenopathy Cardiovascular- irregular heart rate, no murmurs, palpable dorsalis pedis, leg edema + Respiratory- bilateral clear to auscultation, no wheeze, no rhonchi, no crackles, no use of accessory muscles Abdomen- bowel sounds present, soft, non tender, foley catheter present Musculoskeletal- able to move all 4 extremities, generalized weakness Neurological- alert and oriented to self only Skin- warm and dry Psychiatry- calm during visit but confused   Labs reviewed: Basic Metabolic Panel:  Recent Labs  08/23/15 0358 08/24/15 0338 08/25/15 0352  NA 138 140 140  K 3.8 4.0 3.7  CL 100* 103 103  CO2 32 31 29  GLUCOSE 83 156* 87  BUN 18 21* 21*  CREATININE 0.96 1.05 1.00  CALCIUM 8.8* 9.0 8.9   Liver Function Tests:  Recent Labs  10/24/14 0845 04/22/15 07/21/15 08/19/15 1930  AST 10 10* 8* 15  ALT <5 8* 4* 7*  ALKPHOS 68 116 83 96  BILITOT 0.9  --   --  0.9  PROT 5.9*  --   --  6.2*  ALBUMIN 3.1*  --   --  3.1*   No results for input(s): LIPASE, AMYLASE in the last 8760  hours. No results for input(s): AMMONIA in the last 8760 hours. CBC:  Recent Labs  10/24/14 0845  04/07/15 1832  04/10/15 0524  07/21/15 08/19/15 1930 08/20/15 0426  WBC 8.2  < > 11.6*  < > 10.9*  --  8.3 7.7 7.6  NEUTROABS 6.3  --  9.6*  --   --   --   --   --   --   HGB 13.6  < > 12.7*  < > 10.3*  < > 12.4* 13.0 13.0  HCT 42.0  < > 38.0*  < > 31.9*  < > 37* 41.9 41.3  MCV 88.2  --  85.6  < > 89.9  --   --  92.7 92.4  PLT 140*  < > 113*  < > 118*  < > 150 149* 154  < > = values in this interval not displayed. Cardiac Enzymes:  Recent Labs  10/24/14 0845  TROPONINI <0.30   BNP: Invalid input(s): POCBNP CBG:  Recent Labs  08/24/15 2138 08/25/15 0733 08/25/15 1123  GLUCAP 225* 82 124*   Lab Results  Component Value Date   HGBA1C 6.8* 07/21/2015     Assessment/Plan  Physical deconditioning Will have him work with physical therapy and occupational therapy team to help with gait training and muscle strengthening exercises.fall precautions with high fall risk. Skin care. Encourage to be out of bed.   Chronic systolic congestive heart failure Continue  lisinopril 20mg  daily, metoprolol tartrate 150 mg twice daily, lasix 40 mg twice daily, aldactone 25 mg daily and imdur 90mg  daily. Continue daily weight. Monitor bmp. Continue kcl supplement  Hypertensive heart diease Stable bp, continue metoprolol, lisinopril and imdur  as above, monitor bp readings  Protein calorie malnutrition Monitor po itnake and weight, dietary consult to assess for protein supplement  Iron def anemia Continue ferrous fumarate 324 mg daily  DM type 2 with renal manifestations Continue Levemir 5 units daily and tranjenta 5mg  daily with Humalog TID before meals. Monitor cbg. Continue lisinopril and pravachol with baby aspirin.  Mixed dementia Continue aricept 23 mg daily, to provide assistance with ADLs, fall prevention, pressure ulcer prophylaxis  afib Rate controlled. Has ICD. Continue  metoprolol 150 mg bid and baby aspirin  Right femur fracture S/p surgical repair, continue tylenol, tramadol and norco prn pain with fall precautions. Continue vitamin d supplement. Continue dulcolax prn to prevent constipation and monitor  Hypothyroidism Stable. Continue levothyroxine 9mcg daily and monitor.   HLD Continue pravachol 40 mg daily and monitor   Goals of care: long term care  Labs/tests ordered: daily weight, dietary consult,bmp in 1 week  Family/ staff Communication: reviewed care plan with patient and nursing supervisor    Blanchie Serve, MD  [place] [phone] (Monday-Friday 8 am - 5 pm) [phone] (afterhours)

## 2015-08-26 NOTE — Telephone Encounter (Signed)
Patient contacted regarding discharge from [facility] on 08/25/15  Patient understands to follow up with provider luke kilroy pa on 09-09-15 at 10:30 am at [facility] Patient understands discharge instructions? yes Patient understands medications and regiment? yes Patient understands to bring all medications to this visit? yes  Pt currently is at [facility], discussed with his wife.

## 2015-09-01 ENCOUNTER — Telehealth: Payer: Self-pay | Admitting: Cardiovascular Disease

## 2015-09-01 NOTE — Telephone Encounter (Signed)
TCM previously completed

## 2015-09-01 NOTE — Telephone Encounter (Signed)
D/C phone call .Marland Kitchen Appt is on 09/09/15 @2pm  w/ Kerin Ransom at [facility] .   Thanks

## 2015-09-09 ENCOUNTER — Ambulatory Visit (INDEPENDENT_AMBULATORY_CARE_PROVIDER_SITE_OTHER): Payer: Medicare Other | Admitting: Physician Assistant

## 2015-09-09 ENCOUNTER — Encounter: Payer: Self-pay | Admitting: Physician Assistant

## 2015-09-09 ENCOUNTER — Ambulatory Visit: Payer: Medicare Other | Admitting: Cardiology

## 2015-09-09 ENCOUNTER — Telehealth: Payer: Self-pay

## 2015-09-09 VITALS — BP 100/60 | HR 70 | Ht 67.0 in | Wt 144.9 lb

## 2015-09-09 DIAGNOSIS — I4821 Permanent atrial fibrillation: Secondary | ICD-10-CM

## 2015-09-09 DIAGNOSIS — I482 Chronic atrial fibrillation: Secondary | ICD-10-CM | POA: Diagnosis not present

## 2015-09-09 NOTE — Progress Notes (Signed)
Patient ID: Troy Warren, male   DOB: Jun 11, 1931, 81 y.o.   MRN: [id number]    Date:  09/09/2015   ID:  Troy Warren, DOB October 25, 1931, MRN [id number]      PCP:  Blanchie Serve, MD  Primary Cardiologist:  Croitoru   Chief complaint: Post hospital follow-up   History of Present Illness: Troy Warren is a 81 y.o. male was seen in the office by Dr. Jerilynn Mages. Croitoru on 08/19/15 and found to be in acute on chronic combined systolic and diastolic HF with anasarca. Dr. Jerilynn Mages. Croitoru thought the pt was 25-30 lbs overloaded with fluid. With his hx of severe ischemic cardiomyopathy there was concern that his current decompensation could be the significant reduction in the percentage of biventricular pacing due to atrial fibrillation with poorly controlled rate. He has permanent atrial fib and not a candidate for anticoagulationdue to hx of intracranial bleeding. CAD s/p CABG but no coronary insufficiency on this visit. His HTN was poorly controlled on admit. He has medtronic protecta CRT-D. He lives at [facility] and has Dementia.   During his stay, he was diuresed with lasix 40 mg IV BID and was negative 5.8L. His wt  decreased from 184 to 163 lbs. he was discharged on lasix 40 mg po BID. His ICD (defibrillator) tachyarrhythmia therapies are "off", the BIV pacer will continue to pace him. He had episodes of hypoglycemia during the stay. We decreased his Levemir to 5 units HS,   The patient presents for posthospital evaluation.  Overall the patient has no complaints. He denies any shortness of breath or orthopnea. He has bilateral ankle wounds which are being followed at the nursing facility.  They are wrapped currently. He's noticed a decrease in his hearing in the left ear and does fiddle with it. His wife asked me to look at it. Appears to be a large amount of wax. He also has some mild lower extremity edema but otherwise denies nausea, vomiting, fever, chest pain,  dizziness, PND,  cough, congestion, abdominal pain, hematochezia, melena,  claudication.  Wt Readings from Last 3 Encounters:  09/09/15 144 lb 14.4 oz (65.726 kg)  08/25/15 163 lb 6.4 oz (74.118 kg)  08/19/15 184 lb 14.4 oz (83.87 kg)     Past Medical History  Diagnosis Date  . Hypertension   . Ischemic heart disease   . Hyperlipemia   . Depression   . Testicular cancer   . Polio     child polio  . Rheumatic fever   . Mild dementia   . Mild dementia   . Systolic heart failure     acute on chronic  . CAD (coronary artery disease)   . Chronic atrial fibrillation   . Vitamin D deficiency   . Type II or unspecified type diabetes mellitus without mention of complication, not stated as uncontrolled   . Other testicular hypofunction   . Permanent atrial fibrillation 01/21/2014  . ICD, biventricular, in situ - Medtronic Protecta 2013, leads 2009 01/21/2014    New Generator Medtronic [place] model number [id number], serial number [id number]  Right atrial lead (chronic) Medtronic, model number [id number], serial [id number] (implanted 05/01/2008)  Right ventricular lead (chronic) Medtronic, model number [id number], serial number [id number] (implanted 05/01/2008)  Left ventricular lead Medtronic, model number [id number], serial number [id number] (implanted 05/01/2008)  Exp  . Pleural effusion, right 03/18/2014  . Emphysema 03/18/2014  . Pulmonary infiltrate 05/10/2014    CT chest 03/2014:  RUL interstitial/GGO     Current  Outpatient Prescriptions  Medication Sig Dispense Refill  . acetaminophen (TYLENOL) 325 MG tablet Take 2 tablets (650 mg total) by mouth 3 (three) times daily.    Marland Kitchen aspirin 81 MG tablet Take 81 mg by mouth daily.    . bisacodyl (DULCOLAX) 10 MG suppository Place 1 suppository (10 mg total) rectally daily as needed for moderate constipation. 12 suppository 0  . Cholecalciferol (VITAMIN D3) 2000 UNITS TABS Take 2 tablets by mouth daily.     . citalopram (CELEXA) 20 MG tablet Take 20 mg by mouth daily.    .  Cyanocobalamin (VITAMIN B-12 PO) Take 1,000 mcg by mouth daily.     Marland Kitchen donepezil (ARICEPT) 23 MG TABS tablet Take 1 tablet (23 mg total) by mouth at bedtime. 60 tablet 1  . Dorzolamide HCl-Timolol Mal PF (COSOPT PF) 22.3-6.8 MG/ML SOLN Apply 1 drop to eye 2 (two) times daily.    . Ferrous Fumarate 324 (106 FE) MG TABS Take 324 mg by mouth daily.    . furosemide (LASIX) 40 MG tablet Take 1 tablet (40 mg total) by mouth 2 (two) times daily. 60 tablet 3  . HYDROcodone-acetaminophen (NORCO/VICODIN) 5-325 MG per tablet Take one tablet by mouth every four hours as needed for pain. Do not exceed 4grams of Tylenol in 24 hours 180 tablet 0  . insulin detemir (LEVEMIR) 100 UNIT/ML injection Inject 0.05 mLs (5 Units total) into the skin at bedtime. 10 mL 11  . insulin lispro (HUMALOG) 100 UNIT/ML injection Check blood sugar three times a day before meals with sliding scale 5 units for cbg 250-350 8 units for cbg >350-450 10 units for cbg>450    . isosorbide mononitrate (IMDUR) 30 MG 24 hr tablet Take 90 mg by mouth daily. For Hypertension    . KLOR-CON M10 10 MEQ tablet TAKE 1 TABLET TWICE DAILY 180 tablet 0  . levalbuterol (XOPENEX) 0.63 MG/3ML nebulizer solution Take 3 mLs (0.63 mg total) by nebulization every 6 (six) hours as needed for wheezing or shortness of breath. 3 mL 12  . levothyroxine (SYNTHROID, LEVOTHROID) 75 MCG tablet TAKE 1 TABLET EVERY DAY 90 tablet 3  . linagliptin (TRADJENTA) 5 MG TABS tablet Take 5 mg by mouth daily.    Marland Kitchen lisinopril (PRINIVIL,ZESTRIL) 20 MG tablet Take 1 tablet (20 mg total) by mouth daily. 30 tablet 6  . loratadine (CLARITIN) 10 MG tablet Take 10 mg by mouth daily.    Marland Kitchen LORazepam (ATIVAN) 0.5 MG tablet Take one tablet by mouth at bedtime for anxiety (Patient taking differently: Take 0.5 mg by mouth at bedtime. Take one tablet by mouth at bedtime for anxiety) 30 tablet 5  . Metoprolol Tartrate 75 MG TABS Take 150 mg by mouth 2 (two) times daily. 120 tablet 6  .  pravastatin (PRAVACHOL) 40 MG tablet TAKE 1 TABLET EVERY DAY FOR CHOLESTEROL 90 tablet 3  . spironolactone (ALDACTONE) 25 MG tablet Take 1 tablet (25 mg total) by mouth daily. 30 tablet 6  . traMADol (ULTRAM) 50 MG tablet Take one tablet by mouth twice daily for pain (Patient taking differently: Take 50 mg by mouth 2 (two) times daily. Take one tablet by mouth twice daily for pain) 60 tablet 5   No current facility-administered medications for this visit.    Allergies:    Allergies  Allergen Reactions  . Lipitor [Atorvastatin]     weakness    Social History:  The patient  reports that he quit smoking about 46 years ago. He has  never used smokeless tobacco. He reports that he does not drink alcohol or use illicit drugs.   Family history:   Family History  Problem Relation Age of Onset  . Other Mother   . Heart attack Father     ROS:  Please see the history of present illness.  All other systems reviewed and negative.   PHYSICAL EXAM: VS:  BP 100/60 mmHg  Pulse 70  Ht 5\' 7"  (1.702 m)  Wt 144 lb 14.4 oz (65.726 kg)  BMI 22.69 kg/m2 Well nourished, well developed, in no acute distress.  He is essentially wheelchair bound. HEENT: Pupils are equal round react to light accommodation extraocular movements are intact.  He is a large amount of wax in the left ear. Neck: no JVDNo cervical lymphadenopathy. Cardiac: Regular rate and rhythm without murmurs rubs or gallops. Lungs:  clear to auscultation bilaterally, no wheezing, rhonchi or rales Abd: soft, nontender, positive bowel sounds all quadrants, no hepatosplenomegaly Ext:  lower extremity edema.  2+ radial and dorsalis pedis pulses. Skin: warm and very dry.  He has bilateral wounds on each of his ankles. Both were wrapped in her checked every 2-3 days at the nursing facility. I did not unwrap them Neuro:  Grossly normal  EKG:    V paced 70 bpm  ASSESSMENT AND PLAN:  Chronic systolic and diastolic heart failure Patient is lost  another 19 pounds since his discharge. I'm concerned that he is either not eating or that he is now dehydrated.  We have called the skilled nursing facility and asked him to draw a basic metabolic panel. They'll hold his Lasix tonight and tomorrow until we get those results.  Permanent atrial fibrillation  Rate well controlled. Not on anticoagulation due to history of intracranial bleeding. ICD, biventricular: Medtronic protecta 2013  Device appears to be working properly. Essential hypertension He is mildly hypotensive. Hopefully adjusting his Lasix will give him a little better blood pressure.  Lasix dosing adjustment will be considered after basic metabolic panel results are reviewed.

## 2015-09-09 NOTE — Patient Instructions (Signed)
Continue same medications   Your physician recommends that you schedule a follow-up appointment with Dr.Croitoru in 1 month or schedule with a extender    Your physician recommends that you schedule a follow-up appointment in: 3 months with Dr.Croitoru.

## 2015-09-09 NOTE — Telephone Encounter (Signed)
[facility] called spoke to patient's nurse.Tarri Fuller PA advised pt needs a bmet.Fax results to fax [phone].Advised to hold lasix tonight and tomorrow 09/10/15.

## 2015-09-11 ENCOUNTER — Encounter: Payer: Self-pay | Admitting: Cardiovascular Disease

## 2015-09-12 ENCOUNTER — Other Ambulatory Visit: Payer: Self-pay | Admitting: *Deleted

## 2015-09-12 MED ORDER — LORAZEPAM 0.5 MG PO TABS: ORAL_TABLET | ORAL | Status: DC

## 2015-09-12 NOTE — Telephone Encounter (Signed)
[facility] 

## 2015-09-14 LAB — CUP PACEART INCLINIC DEVICE CHECK
Brady Statistic AP VP Percent: 0 %
Brady Statistic AP VS Percent: 0 %
Brady Statistic AS VS Percent: 29.9 %
Brady Statistic RA Percent Paced: 0 %
Brady Statistic RV Percent Paced: 70.1 %
HIGH POWER IMPEDANCE MEASURED VALUE: 304 Ohm
HighPow Impedance: 39 Ohm
HighPow Impedance: 52 Ohm
Lead Channel Impedance Value: 361 Ohm
Lead Channel Impedance Value: 361 Ohm
Lead Channel Impedance Value: 418 Ohm
Lead Channel Impedance Value: 513 Ohm
Lead Channel Sensing Intrinsic Amplitude: 1.5 mV
Lead Channel Setting Pacing Amplitude: 2.5 V
Lead Channel Setting Pacing Pulse Width: 1 ms
Lead Channel Setting Sensing Sensitivity: 0.3 mV
MDC IDC MSMT BATTERY VOLTAGE: 2.9 V
MDC IDC MSMT LEADCHNL LV IMPEDANCE VALUE: 760 Ohm
MDC IDC MSMT LEADCHNL LV PACING THRESHOLD AMPLITUDE: 2.125 V
MDC IDC MSMT LEADCHNL LV PACING THRESHOLD PULSEWIDTH: 1 ms
MDC IDC MSMT LEADCHNL RA SENSING INTR AMPL: 1.5 mV
MDC IDC MSMT LEADCHNL RV PACING THRESHOLD AMPLITUDE: 1.125 V
MDC IDC MSMT LEADCHNL RV PACING THRESHOLD PULSEWIDTH: 0.4 ms
MDC IDC MSMT LEADCHNL RV SENSING INTR AMPL: 5.125 mV
MDC IDC MSMT LEADCHNL RV SENSING INTR AMPL: 5.125 mV
MDC IDC SESS DTM: 20160906184645
MDC IDC SET LEADCHNL LV PACING AMPLITUDE: 3 V
MDC IDC SET LEADCHNL RV PACING PULSEWIDTH: 0.7 ms
MDC IDC SET ZONE DETECTION INTERVAL: 340 ms
MDC IDC SET ZONE DETECTION INTERVAL: 380 ms
MDC IDC STAT BRADY AS VP PERCENT: 70.1 %
Zone Setting Detection Interval: 300 ms
Zone Setting Detection Interval: 350 ms

## 2015-09-24 ENCOUNTER — Encounter: Payer: Self-pay | Admitting: Cardiovascular Disease

## 2015-09-28 ENCOUNTER — Encounter (HOSPITAL_COMMUNITY): Payer: Self-pay | Admitting: *Deleted

## 2015-09-28 ENCOUNTER — Emergency Department (HOSPITAL_COMMUNITY): Payer: Medicare Other

## 2015-09-28 ENCOUNTER — Inpatient Hospital Stay (HOSPITAL_COMMUNITY)
Admission: EM | Admit: 2015-09-28 | Discharge: 2015-10-01 | DRG: 871 | Disposition: A | Discharge status: Skilled Nursing Facility | Payer: Medicare Other | Attending: Internal Medicine | Admitting: Internal Medicine

## 2015-09-28 DIAGNOSIS — R6520 Severe sepsis without septic shock: Secondary | ICD-10-CM | POA: Diagnosis present

## 2015-09-28 DIAGNOSIS — Z7982 Long term (current) use of aspirin: Secondary | ICD-10-CM | POA: Diagnosis not present

## 2015-09-28 DIAGNOSIS — R1084 Generalized abdominal pain: Secondary | ICD-10-CM

## 2015-09-28 DIAGNOSIS — Z8249 Family history of ischemic heart disease and other diseases of the circulatory system: Secondary | ICD-10-CM | POA: Diagnosis not present

## 2015-09-28 DIAGNOSIS — Z794 Long term (current) use of insulin: Secondary | ICD-10-CM

## 2015-09-28 DIAGNOSIS — Z79899 Other long term (current) drug therapy: Secondary | ICD-10-CM | POA: Diagnosis not present

## 2015-09-28 DIAGNOSIS — Z951 Presence of aortocoronary bypass graft: Secondary | ICD-10-CM

## 2015-09-28 DIAGNOSIS — E875 Hyperkalemia: Secondary | ICD-10-CM | POA: Diagnosis present

## 2015-09-28 DIAGNOSIS — F0280 Dementia in other diseases classified elsewhere without behavioral disturbance: Secondary | ICD-10-CM | POA: Diagnosis present

## 2015-09-28 DIAGNOSIS — E039 Hypothyroidism, unspecified: Secondary | ICD-10-CM | POA: Diagnosis present

## 2015-09-28 DIAGNOSIS — Z95810 Presence of automatic (implantable) cardiac defibrillator: Secondary | ICD-10-CM

## 2015-09-28 DIAGNOSIS — N179 Acute kidney failure, unspecified: Secondary | ICD-10-CM | POA: Diagnosis not present

## 2015-09-28 DIAGNOSIS — Y92129 Unspecified place in nursing home as the place of occurrence of the external cause: Secondary | ICD-10-CM

## 2015-09-28 DIAGNOSIS — N170 Acute kidney failure with tubular necrosis: Secondary | ICD-10-CM | POA: Diagnosis present

## 2015-09-28 DIAGNOSIS — I130 Hypertensive heart and chronic kidney disease with heart failure and stage 1 through stage 4 chronic kidney disease, or unspecified chronic kidney disease: Secondary | ICD-10-CM | POA: Diagnosis present

## 2015-09-28 DIAGNOSIS — Z79891 Long term (current) use of opiate analgesic: Secondary | ICD-10-CM

## 2015-09-28 DIAGNOSIS — E785 Hyperlipidemia, unspecified: Secondary | ICD-10-CM | POA: Diagnosis present

## 2015-09-28 DIAGNOSIS — A419 Sepsis, unspecified organism: Secondary | ICD-10-CM | POA: Diagnosis not present

## 2015-09-28 DIAGNOSIS — N390 Urinary tract infection, site not specified: Secondary | ICD-10-CM | POA: Diagnosis not present

## 2015-09-28 DIAGNOSIS — R627 Adult failure to thrive: Secondary | ICD-10-CM | POA: Diagnosis not present

## 2015-09-28 DIAGNOSIS — Z888 Allergy status to other drugs, medicaments and biological substances status: Secondary | ICD-10-CM | POA: Diagnosis not present

## 2015-09-28 DIAGNOSIS — N189 Chronic kidney disease, unspecified: Secondary | ICD-10-CM | POA: Diagnosis present

## 2015-09-28 DIAGNOSIS — G309 Alzheimer's disease, unspecified: Secondary | ICD-10-CM | POA: Diagnosis present

## 2015-09-28 DIAGNOSIS — E872 Acidosis, unspecified: Secondary | ICD-10-CM

## 2015-09-28 DIAGNOSIS — R52 Pain, unspecified: Secondary | ICD-10-CM | POA: Diagnosis not present

## 2015-09-28 DIAGNOSIS — I5042 Chronic combined systolic (congestive) and diastolic (congestive) heart failure: Secondary | ICD-10-CM | POA: Diagnosis present

## 2015-09-28 DIAGNOSIS — I482 Chronic atrial fibrillation: Secondary | ICD-10-CM | POA: Diagnosis present

## 2015-09-28 DIAGNOSIS — E559 Vitamin D deficiency, unspecified: Secondary | ICD-10-CM | POA: Diagnosis present

## 2015-09-28 DIAGNOSIS — W19XXXA Unspecified fall, initial encounter: Secondary | ICD-10-CM | POA: Diagnosis present

## 2015-09-28 DIAGNOSIS — N183 Chronic kidney disease, stage 3 (moderate): Secondary | ICD-10-CM

## 2015-09-28 DIAGNOSIS — Z515 Encounter for palliative care: Secondary | ICD-10-CM | POA: Diagnosis not present

## 2015-09-28 DIAGNOSIS — I129 Hypertensive chronic kidney disease with stage 1 through stage 4 chronic kidney disease, or unspecified chronic kidney disease: Secondary | ICD-10-CM

## 2015-09-28 DIAGNOSIS — E1122 Type 2 diabetes mellitus with diabetic chronic kidney disease: Secondary | ICD-10-CM | POA: Diagnosis present

## 2015-09-28 DIAGNOSIS — J9600 Acute respiratory failure, unspecified whether with hypoxia or hypercapnia: Secondary | ICD-10-CM | POA: Diagnosis present

## 2015-09-28 DIAGNOSIS — I4821 Permanent atrial fibrillation: Secondary | ICD-10-CM | POA: Diagnosis present

## 2015-09-28 DIAGNOSIS — I255 Ischemic cardiomyopathy: Secondary | ICD-10-CM | POA: Diagnosis present

## 2015-09-28 DIAGNOSIS — Z66 Do not resuscitate: Secondary | ICD-10-CM | POA: Diagnosis present

## 2015-09-28 DIAGNOSIS — E1129 Type 2 diabetes mellitus with other diabetic kidney complication: Secondary | ICD-10-CM | POA: Diagnosis present

## 2015-09-28 DIAGNOSIS — Z8547 Personal history of malignant neoplasm of testis: Secondary | ICD-10-CM

## 2015-09-28 DIAGNOSIS — Z87891 Personal history of nicotine dependence: Secondary | ICD-10-CM | POA: Diagnosis not present

## 2015-09-28 DIAGNOSIS — F329 Major depressive disorder, single episode, unspecified: Secondary | ICD-10-CM | POA: Diagnosis present

## 2015-09-28 DIAGNOSIS — B964 Proteus (mirabilis) (morganii) as the cause of diseases classified elsewhere: Secondary | ICD-10-CM | POA: Diagnosis present

## 2015-09-28 DIAGNOSIS — J9601 Acute respiratory failure with hypoxia: Secondary | ICD-10-CM | POA: Diagnosis not present

## 2015-09-28 DIAGNOSIS — F0150 Vascular dementia without behavioral disturbance: Secondary | ICD-10-CM | POA: Diagnosis present

## 2015-09-28 DIAGNOSIS — I959 Hypotension, unspecified: Secondary | ICD-10-CM | POA: Diagnosis present

## 2015-09-28 DIAGNOSIS — R092 Respiratory arrest: Secondary | ICD-10-CM | POA: Diagnosis present

## 2015-09-28 DIAGNOSIS — L8990 Pressure ulcer of unspecified site, unspecified stage: Secondary | ICD-10-CM | POA: Insufficient documentation

## 2015-09-28 DIAGNOSIS — I2510 Atherosclerotic heart disease of native coronary artery without angina pectoris: Secondary | ICD-10-CM | POA: Diagnosis present

## 2015-09-28 LAB — I-STAT TROPONIN, ED: Troponin i, poc: 0.02 ng/mL (ref 0.00–0.08)

## 2015-09-28 LAB — COMPREHENSIVE METABOLIC PANEL
ALBUMIN: 3.1 g/dL — AB (ref 3.5–5.0)
ALK PHOS: 90 U/L (ref 38–126)
ALT: 10 U/L — ABNORMAL LOW (ref 17–63)
ANION GAP: 8 (ref 5–15)
AST: 20 U/L (ref 15–41)
BILIRUBIN TOTAL: 0.7 mg/dL (ref 0.3–1.2)
BUN: 52 mg/dL — ABNORMAL HIGH (ref 6–20)
CALCIUM: 9 mg/dL (ref 8.9–10.3)
CO2: 25 mmol/L (ref 22–32)
Chloride: 100 mmol/L — ABNORMAL LOW (ref 101–111)
Creatinine, Ser: 2.12 mg/dL — ABNORMAL HIGH (ref 0.61–1.24)
GFR, EST AFRICAN AMERICAN: 32 mL/min — AB (ref >60)
GFR, EST NON AFRICAN AMERICAN: 28 mL/min — AB (ref >60)
Glucose, Bld: 219 mg/dL — ABNORMAL HIGH (ref 65–99)
Potassium: 5.3 mmol/L — ABNORMAL HIGH (ref 3.5–5.1)
Sodium: 133 mmol/L — ABNORMAL LOW (ref 135–145)
TOTAL PROTEIN: 6 g/dL — AB (ref 6.5–8.1)

## 2015-09-28 LAB — I-STAT CG4 LACTIC ACID, ED
LACTIC ACID, VENOUS: 2.04 mmol/L — AB (ref 0.5–2.0)
Lactic Acid, Venous: 1.18 mmol/L (ref 0.5–2.0)

## 2015-09-28 LAB — CBC WITH DIFFERENTIAL/PLATELET
BASOS ABS: 0 10*3/uL (ref 0.0–0.1)
BASOS PCT: 0 %
Eosinophils Absolute: 0.2 10*3/uL (ref 0.0–0.7)
Eosinophils Relative: 2 %
HEMATOCRIT: 40.2 % (ref 39.0–52.0)
HEMOGLOBIN: 13.3 g/dL (ref 13.0–17.0)
LYMPHS PCT: 14 %
Lymphs Abs: 1.2 10*3/uL (ref 0.7–4.0)
MCH: 29 pg (ref 26.0–34.0)
MCHC: 33.1 g/dL (ref 30.0–36.0)
MCV: 87.6 fL (ref 78.0–100.0)
MONO ABS: 0.9 10*3/uL (ref 0.1–1.0)
MONOS PCT: 10 %
NEUTROS ABS: 6.7 10*3/uL (ref 1.7–7.7)
NEUTROS PCT: 74 %
Platelets: 145 10*3/uL — ABNORMAL LOW (ref 150–400)
RBC: 4.59 MIL/uL (ref 4.22–5.81)
RDW: 15.6 % — AB (ref 11.5–15.5)
WBC: 9.1 10*3/uL (ref 4.0–10.5)

## 2015-09-28 LAB — I-STAT ARTERIAL BLOOD GAS, ED
Acid-base deficit: 6 mmol/L — ABNORMAL HIGH (ref 0.0–2.0)
BICARBONATE: 20.8 meq/L (ref 20.0–24.0)
O2 SAT: 99 %
PCO2 ART: 42.7 mmHg (ref 35.0–45.0)
TCO2: 22 mmol/L (ref 0–100)
pH, Arterial: 7.296 — ABNORMAL LOW (ref 7.350–7.450)
pO2, Arterial: 129 mmHg — ABNORMAL HIGH (ref 80.0–100.0)

## 2015-09-28 LAB — URINALYSIS, ROUTINE W REFLEX MICROSCOPIC
BILIRUBIN URINE: NEGATIVE
Glucose, UA: 100 mg/dL — AB
HGB URINE DIPSTICK: NEGATIVE
KETONES UR: NEGATIVE mg/dL
NITRITE: NEGATIVE
Protein, ur: NEGATIVE mg/dL
Specific Gravity, Urine: 1.012 (ref 1.005–1.030)
UROBILINOGEN UA: 0.2 mg/dL (ref 0.0–1.0)
pH: 6 (ref 5.0–8.0)

## 2015-09-28 LAB — I-STAT CHEM 8, ED
BUN: 50 mg/dL — AB (ref 6–20)
CREATININE: 2.1 mg/dL — AB (ref 0.61–1.24)
Calcium, Ion: 1.25 mmol/L (ref 1.13–1.30)
Chloride: 103 mmol/L (ref 101–111)
Glucose, Bld: 217 mg/dL — ABNORMAL HIGH (ref 65–99)
HEMATOCRIT: 41 % (ref 39.0–52.0)
Hemoglobin: 13.9 g/dL (ref 13.0–17.0)
POTASSIUM: 5.2 mmol/L — AB (ref 3.5–5.1)
SODIUM: 136 mmol/L (ref 135–145)
TCO2: 21 mmol/L (ref 0–100)

## 2015-09-28 LAB — PROTIME-INR
INR: 1.16 (ref 0.00–1.49)
Prothrombin Time: 15 seconds (ref 11.6–15.2)

## 2015-09-28 LAB — BRAIN NATRIURETIC PEPTIDE: B NATRIURETIC PEPTIDE 5: 175.1 pg/mL — AB (ref 0.0–100.0)

## 2015-09-28 LAB — GLUCOSE, CAPILLARY: Glucose-Capillary: 142 mg/dL — ABNORMAL HIGH (ref 65–99)

## 2015-09-28 LAB — APTT: APTT: 26 s (ref 24–37)

## 2015-09-28 LAB — LACTIC ACID, PLASMA: LACTIC ACID, VENOUS: 1.2 mmol/L (ref 0.5–2.0)

## 2015-09-28 LAB — URINE MICROSCOPIC-ADD ON

## 2015-09-28 LAB — PROCALCITONIN: Procalcitonin: 0.15 ng/mL

## 2015-09-28 MED ORDER — PIPERACILLIN-TAZOBACTAM 3.375 G IVPB 30 MIN
3.3750 g | Freq: Once | INTRAVENOUS | Status: AC
  Administered 2015-09-28: 3.375 g via INTRAVENOUS
  Filled 2015-09-28: qty 50

## 2015-09-28 MED ORDER — BISACODYL 10 MG RE SUPP: 10.0000 mg | Freq: Every day | RECTAL | Status: DC | PRN

## 2015-09-28 MED ORDER — DONEPEZIL HCL 23 MG PO TABS
23.0000 mg | ORAL_TABLET | Freq: Every day | ORAL | Status: DC
  Administered 2015-09-28 – 2015-09-29 (×2): 23 mg via ORAL
  Filled 2015-09-28 (×3): qty 1

## 2015-09-28 MED ORDER — PIPERACILLIN-TAZOBACTAM 3.375 G IVPB
3.3750 g | Freq: Three times a day (TID) | INTRAVENOUS | Status: DC
  Administered 2015-09-29 (×2): 3.375 g via INTRAVENOUS
  Filled 2015-09-28 (×3): qty 50

## 2015-09-28 MED ORDER — INSULIN ASPART 100 UNIT/ML ~~LOC~~ SOLN
0.0000 [IU] | SUBCUTANEOUS | Status: DC
  Administered 2015-09-28: 1 [IU] via SUBCUTANEOUS
  Administered 2015-09-29 – 2015-09-30 (×3): 2 [IU] via SUBCUTANEOUS
  Administered 2015-09-30: 1 [IU] via SUBCUTANEOUS

## 2015-09-28 MED ORDER — LEVALBUTEROL HCL 0.63 MG/3ML IN NEBU: 0.6300 mg | INHALATION_SOLUTION | Freq: Four times a day (QID) | RESPIRATORY_TRACT | Status: DC | PRN

## 2015-09-28 MED ORDER — DIAZEPAM 2 MG PO TABS: 1.0000 mg | ORAL_TABLET | Freq: Three times a day (TID) | ORAL | Status: DC | PRN

## 2015-09-28 MED ORDER — SODIUM CHLORIDE 0.9 % IV BOLUS (SEPSIS)
1000.0000 mL | Freq: Once | INTRAVENOUS | Status: AC
  Administered 2015-09-28: 1000 mL via INTRAVENOUS

## 2015-09-28 MED ORDER — INSULIN DETEMIR 100 UNIT/ML ~~LOC~~ SOLN
5.0000 [IU] | Freq: Every day | SUBCUTANEOUS | Status: DC
  Administered 2015-09-28 – 2015-09-29 (×2): 5 [IU] via SUBCUTANEOUS
  Filled 2015-09-28 (×3): qty 0.05

## 2015-09-28 MED ORDER — HEPARIN SODIUM (PORCINE) 5000 UNIT/ML IJ SOLN
5000.0000 [IU] | Freq: Three times a day (TID) | INTRAMUSCULAR | Status: DC
  Administered 2015-09-28 – 2015-09-30 (×5): 5000 [IU] via SUBCUTANEOUS
  Filled 2015-09-28 (×5): qty 1

## 2015-09-28 MED ORDER — DEXTROSE 5 % IV SOLN
1.0000 g | Freq: Once | INTRAVENOUS | Status: AC
  Administered 2015-09-28: 1 g via INTRAVENOUS
  Filled 2015-09-28: qty 10

## 2015-09-28 MED ORDER — LORAZEPAM 2 MG/ML IJ SOLN
0.5000 mg | Freq: Four times a day (QID) | INTRAMUSCULAR | Status: DC | PRN
  Administered 2015-09-29 – 2015-10-01 (×2): 0.5 mg via INTRAVENOUS
  Filled 2015-09-28 (×3): qty 1

## 2015-09-28 MED ORDER — CITALOPRAM HYDROBROMIDE 20 MG PO TABS
20.0000 mg | ORAL_TABLET | Freq: Every day | ORAL | Status: DC
  Administered 2015-09-29 – 2015-09-30 (×2): 20 mg via ORAL
  Filled 2015-09-28 (×2): qty 1

## 2015-09-28 MED ORDER — DORZOLAMIDE HCL-TIMOLOL MAL 2-0.5 % OP SOLN
1.0000 [drp] | Freq: Two times a day (BID) | OPHTHALMIC | Status: DC
  Administered 2015-09-28 – 2015-10-01 (×6): 1 [drp] via OPHTHALMIC
  Filled 2015-09-28: qty 10

## 2015-09-28 MED ORDER — POLYVINYL ALCOHOL 1.4 % OP SOLN
1.0000 [drp] | Freq: Three times a day (TID) | OPHTHALMIC | Status: DC
  Administered 2015-09-28 – 2015-10-01 (×8): 1 [drp] via OPHTHALMIC
  Filled 2015-09-28: qty 15

## 2015-09-28 MED ORDER — SODIUM CHLORIDE 0.9 % IV SOLN
INTRAVENOUS | Status: AC
  Administered 2015-09-28: 21:00:00 via INTRAVENOUS

## 2015-09-28 MED ORDER — LORAZEPAM 2 MG/ML IJ SOLN
0.5000 mg | Freq: Once | INTRAMUSCULAR | Status: AC
  Administered 2015-09-28: 0.5 mg via INTRAVENOUS
  Filled 2015-09-28: qty 1

## 2015-09-28 MED ORDER — SODIUM CHLORIDE 0.9 % IV BOLUS (SEPSIS)
500.0000 mL | Freq: Once | INTRAVENOUS | Status: AC
  Administered 2015-09-28: 500 mL via INTRAVENOUS

## 2015-09-28 MED ORDER — ONDANSETRON HCL 4 MG PO TABS: 4.0000 mg | ORAL_TABLET | Freq: Four times a day (QID) | ORAL | Status: DC | PRN

## 2015-09-28 MED ORDER — PROPYLENE GLYCOL 0.6 % OP SOLN: 1.0000 [drp] | Freq: Three times a day (TID) | OPHTHALMIC | Status: DC

## 2015-09-28 MED ORDER — HYDROCODONE-ACETAMINOPHEN 5-325 MG PO TABS: 1.0000 | ORAL_TABLET | Freq: Four times a day (QID) | ORAL | Status: DC | PRN

## 2015-09-28 MED ORDER — ASPIRIN EC 81 MG PO TBEC
81.0000 mg | DELAYED_RELEASE_TABLET | Freq: Every day | ORAL | Status: DC
  Administered 2015-09-29 – 2015-09-30 (×2): 81 mg via ORAL
  Filled 2015-09-28 (×3): qty 1

## 2015-09-28 MED ORDER — PIPERACILLIN-TAZOBACTAM 3.375 G IVPB 30 MIN: 3.3750 g | Freq: Three times a day (TID) | INTRAVENOUS | Status: DC

## 2015-09-28 MED ORDER — LEVOTHYROXINE SODIUM 75 MCG PO TABS
75.0000 ug | ORAL_TABLET | Freq: Every day | ORAL | Status: DC
  Administered 2015-09-29 – 2015-09-30 (×2): 75 ug via ORAL
  Filled 2015-09-28 (×2): qty 1

## 2015-09-28 MED ORDER — SODIUM CHLORIDE 0.9 % IJ SOLN
3.0000 mL | Freq: Two times a day (BID) | INTRAMUSCULAR | Status: DC
  Administered 2015-09-28 – 2015-10-01 (×4): 3 mL via INTRAVENOUS

## 2015-09-28 MED ORDER — SODIUM CHLORIDE 0.9 % IV BOLUS (SEPSIS): 1000.0000 mL | Freq: Once | INTRAVENOUS | Status: DC

## 2015-09-28 MED ORDER — ONDANSETRON HCL 4 MG/2ML IJ SOLN: 4.0000 mg | Freq: Four times a day (QID) | INTRAMUSCULAR | Status: DC | PRN

## 2015-09-28 NOTE — Progress Notes (Signed)
ANTIBIOTIC CONSULT NOTE - INITIAL  Pharmacy Consult for Zosyn Indication: Pyelonephritis  Allergies  Allergen Reactions  . Lipitor [Atorvastatin]     weakness    Patient Measurements: Height: 5\' 7"  (170.2 cm) Weight: 144 lb (65.318 kg) IBW/kg (Calculated) : 66.1   Vital Signs: Temp: 96.8 F (36 C) (10/16 1223) Temp Source: Rectal (10/16 1223) BP: 145/94 mmHg (10/16 1800) Pulse Rate: 75 (10/16 1800)  Labs:  Recent Labs  09/28/15 1232 09/28/15 1242  WBC 9.1  --   HGB 13.3 13.9  PLT 145*  --   CREATININE 2.12* 2.10*   Estimated Creatinine Clearance: 25.5 mL/min (by C-G formula based on Cr of 2.1). No results for input(s): VANCOTROUGH, VANCOPEAK, VANCORANDOM, GENTTROUGH, GENTPEAK, GENTRANDOM, TOBRATROUGH, TOBRAPEAK, TOBRARND, AMIKACINPEAK, AMIKACINTROU, AMIKACIN in the last 72 hours.   Microbiology: No results found for this or any previous visit (from the past 720 hour(s)).  Medical History: Past Medical History  Diagnosis Date  . Hypertension   . Ischemic heart disease   . Hyperlipemia   . Depression   . Testicular cancer ([place])   . Polio     child polio  . Rheumatic fever   . Mild dementia   . Mild dementia   . Systolic heart failure ([facility])     acute on chronic  . CAD (coronary artery disease)   . Chronic atrial fibrillation ([place])   . Vitamin D deficiency   . Type II or unspecified type diabetes mellitus without mention of complication, not stated as uncontrolled   . Other testicular hypofunction   . Permanent atrial fibrillation ([place]) 01/21/2014  . ICD, biventricular, in situ - Medtronic Protecta 2013, leads 2009 01/21/2014    New Generator Medtronic [place] model number [id number], serial number [id number]  Right atrial lead (chronic) Medtronic, model number [id number], serial [id number] (implanted 05/01/2008)  Right ventricular lead (chronic) Medtronic, model number [id number], serial number [id number] (implanted 05/01/2008)  Left ventricular lead Medtronic, model  number [id number], serial number [id number] (implanted 05/01/2008)  Exp  . Pleural effusion, right 03/18/2014  . Emphysema 03/18/2014  . Pulmonary infiltrate 05/10/2014    CT chest 03/2014:  RUL interstitial/GGO     Assessment: 81 year old male to begin Zosyn for pyelonephritis. CrCl = 26 ml/ min  Goal of Therapy:  Appropriate dosing  Plan:  Zosyn 3.375 grams iv q 8 hours - 4 hr infusion Pharmacy to sign off and follow peripherally  Thank you Anette Guarneri, PharmD [phone]  09/28/2015,7:56 PM

## 2015-09-28 NOTE — ED Notes (Signed)
Per ems- pt is from [facility] and was last seen at 1105 staff went to check on him again at 1115 and found pt apneic and unresponsive. Staff started CPR. Upon ems arrival pt remained unresponsive and they assisted ventilations. Pulses were present. Pt became more responsive in transport. Pt received 561ml NS en route. Pt alert to self upon arrival. Pt noted to have left knee contusion from reported fall yesterday.

## 2015-09-28 NOTE — ED Notes (Signed)
Admitting MD at bedside.

## 2015-09-28 NOTE — H&P (Signed)
[facility] History and Physical  Troy Warren OZH:[id number] DOB: October 31, 1931 DOA: 09/28/2015  Referring physician: Emergency Department PCP: Blanchie Serve, MD   CHIEF COMPLAINT:  Respiratory arrest.   HPI: Troy Warren is a 81 y.o. male with multiple medical problems not limited to chronic combined systolic and diastolic heart failure, atrial fibrillation, biventricular ICD placement, hypertension, diabetes. Patient was hospitalized early September for almost a week for acute on chronic combined systolic and diastolic heart failure. He was diuresed with a 19 pound weight loss.  This morning patient was found by nursing home staff to be unresponsive / not breathing. Nursing home staff initiated CPR but when EMS arrived an patient determined to have a pulse, chest compressions were discontinued.  Bag/valve rescue breathing initiated, IVF started.  Patient became responsive on way to ED.   Wife, daughter and patient's sister at bedside. History comes from Epic records and family, patient has dementia and is confused at baseline. Patient has times where he is able to converse appropriately. He is able to recognize some family members. Lately patient has been sleeping excessively during the day. He has recently become unable to feed himself. Patient was ambulating with a walker to the bathroom but has not been able to do that the last 2 weeks. Yesterday patient was sitting in a wheelchair and of apparently stood up and then fail striking his head.   ED COURSE:  On the way over to the emergency department patient became responsive and apparently asked health care providers to leave them alone. Wife is in the emergency department,EDP spoke with the wife and patient made DO NOT RESUSCITATE. Initial systolic blood pressure 72, now up to 103 with IVF, heart stable in the sixties and seventies. Respirations 18. O2 saturation 100% on nasal cannula.  Temp 96. 8. ABGs - pH 7.2, /02 129, bicar  20 Chest x-ray negative.  Trop 0.02 EKG:  Atrial fibrillation Ventricular bigeminy Right bundle branch block Probable left ventricular hypertrophy Prolonged QT interval  Lactic acid 2.04 Creatinine 2.1, baseline around 1.0  K+. 5.2 Hgb 13, normal WBC  Head CT- no acute abnormality. Remote large left MCA territory infarct seen again  Urinalysis-cloudy, hyaline cast, large leukocytes, pH 6.0, WBCs 21-50  Medications  sodium chloride 0.9 % bolus 1,000 mL (not administered)  cefTRIAXone (ROCEPHIN) 1 g in dextrose 5 % 50 mL IVPB (not administered)  sodium chloride 0.9 % bolus 500 mL (0 mLs Intravenous Stopped 09/28/15 1455)   REVIEW OF SYSTEMS:  Unobtainable to obtain secondary to altered mental status/dementia  Past Medical History  Diagnosis Date  . Hypertension   . Ischemic heart disease   . Hyperlipemia   . Depression   . Testicular cancer ([place])   . Polio     child polio  . Rheumatic fever   . Mild dementia   . Mild dementia   . Systolic heart failure (HCC)     acute on chronic  . CAD (coronary artery disease)   . Chronic atrial fibrillation ([place])   . Vitamin D deficiency   . Type II or unspecified type diabetes mellitus without mention of complication, not stated as uncontrolled   . Other testicular hypofunction   . Permanent atrial fibrillation ([place]) 01/21/2014  . ICD, biventricular, in situ - Medtronic Protecta 2013, leads 2009 01/21/2014    New Generator Medtronic [place] model number [id number], serial number [id number]  Right atrial lead (chronic) Medtronic, model number [id number], serial [id number] (implanted 05/01/2008)  Right ventricular lead (chronic)  Medtronic, model number [id number], serial number [id number] (implanted 05/01/2008)  Left ventricular lead Medtronic, model number [id number], serial number [id number] (implanted 05/01/2008)  Exp  . Pleural effusion, right 03/18/2014  . Emphysema 03/18/2014  . Pulmonary infiltrate 05/10/2014    CT chest 03/2014:  RUL interstitial/GGO     Past Surgical History  Procedure Laterality Date  . Coronary artery bypass graft  06/26/2002    LIMA to diagonal artery & SVG to the first obtuse margin  . Nm myocar perf wall motion  09/15/2006    Dilated LV w/small area of possible nontransmural inferior scar w/mild perinfarct ischemia.  No significant change from previous study.  . Cardiac defibrillator placement  05/01/2008    Medtronic  . Biv icd genertaor change out N/A 10/27/2012    Procedure: BIV ICD GENERTAOR CHANGE OUT;  Surgeon: Sanda Klein, MD;  Location: [facility] CATH LAB;  Service: Cardiovascular;  Laterality: N/A;  . Intramedullary (im) nail intertrochanteric Right 04/07/2015    Procedure: INTRAMEDULLARY (IM) NAIL INTERTROCHANTRIC;  Surgeon: Netta Cedars, MD;  Location: [facility];  Service: Orthopedics;  Laterality: Right;   Social History:  reports that he quit smoking about 46 years ago. He has never used smokeless tobacco. He reports that he does not drink alcohol or use illicit drugs. Lives:   At Nursing Home Assistive devices:  Was ambulating to bathroom with walker but not ambulating in last two weeks.     Allergies  Allergen Reactions  . Lipitor [Atorvastatin]     weakness    Family History  Problem Relation Age of Onset  . Other Mother   . Heart attack Father     Prior to Admission medications   Medication Sig Start Date End Date Taking? Authorizing Provider  acetaminophen (TYLENOL) 325 MG tablet Take 2 tablets (650 mg total) by mouth 3 (three) times daily. 08/25/15  Yes Isaiah Serge, NP  aspirin 81 MG tablet Take 81 mg by mouth daily.   Yes Historical Provider, MD  bisacodyl (DULCOLAX) 10 MG suppository Place 1 suppository (10 mg total) rectally daily as needed for moderate constipation. 04/10/15  Yes Velvet Bathe, MD  Cholecalciferol (VITAMIN D3) 2000 UNITS TABS Take 4,000 Units by mouth daily.    Yes Historical Provider, MD  citalopram (CELEXA) 20 MG tablet Take 20 mg by mouth daily.   Yes Historical Provider, MD   Cyanocobalamin (VITAMIN B-12 PO) Take 1,000 mcg by mouth daily.    Yes Historical Provider, MD  donepezil (ARICEPT) 23 MG TABS tablet Take 1 tablet (23 mg total) by mouth at bedtime. 10/07/14  Yes Garvin Fila, MD  dorzolamide-timolol (COSOPT) 22.3-6.8 MG/ML ophthalmic solution Place 1 drop into both eyes 2 (two) times daily.   Yes Historical Provider, MD  Ferrous Fumarate 324 (106 FE) MG TABS Take 324 mg by mouth daily.   Yes Historical Provider, MD  HYDROcodone-acetaminophen (NORCO/VICODIN) 5-325 MG per tablet Take one tablet by mouth every four hours as needed for pain. Do not exceed 4grams of Tylenol in 24 hours 05/19/15  Yes Tiffany L Reed, DO  insulin detemir (LEVEMIR) 100 UNIT/ML injection Inject 0.05 mLs (5 Units total) into the skin at bedtime. 08/25/15  Yes Isaiah Serge, NP  insulin lispro (HUMALOG) 100 UNIT/ML injection Check blood sugar three times a day before meals with sliding scale 5 units for cbg 250-350 8 units for cbg >350-450 10 units for cbg>450   Yes Historical Provider, MD  isosorbide mononitrate (IMDUR) 30 MG 24 hr tablet Take 90  mg by mouth daily. For Hypertension   Yes Historical Provider, MD  KLOR-CON M10 10 MEQ tablet TAKE 1 TABLET TWICE DAILY 01/22/15  Yes Unk Pinto, MD  levalbuterol [facility]) 0.63 MG/3ML nebulizer solution Take 3 mLs (0.63 mg total) by nebulization every 6 (six) hours as needed for wheezing or shortness of breath. 08/25/15  Yes Isaiah Serge, NP  levothyroxine (SYNTHROID, LEVOTHROID) 75 MCG tablet TAKE 1 TABLET EVERY DAY 10/29/13  Yes Melissa Smith, PA-C  linagliptin (TRADJENTA) 5 MG TABS tablet Take 5 mg by mouth daily.   Yes Historical Provider, MD  lisinopril (PRINIVIL,ZESTRIL) 20 MG tablet Take 1 tablet (20 mg total) by mouth daily. 08/25/15  Yes Isaiah Serge, NP  loratadine (CLARITIN) 10 MG tablet Take 10 mg by mouth daily.   Yes Historical Provider, MD  LORazepam (ATIVAN) 0.5 MG tablet Take one tablet by mouth every night at bedtime for  anxiety Patient taking differently: Take 0.5 mg by mouth at bedtime. Take one tablet by mouth every night at bedtime for anxiety 09/12/15  Yes Gildardo Cranker, DO  Metoprolol Tartrate 75 MG TABS Take 150 mg by mouth 2 (two) times daily. 08/25/15  Yes Isaiah Serge, NP  nystatin cream (MYCOSTATIN) Apply 1 application topically 2 (two) times daily. 08/26/15  Yes Historical Provider, MD  pravastatin (PRAVACHOL) 40 MG tablet TAKE 1 TABLET EVERY DAY FOR CHOLESTEROL 08/23/14  Yes Unk Pinto, MD  Propylene Glycol (SYSTANE BALANCE) 0.6 % SOLN Apply 1 drop to eye 3 (three) times daily.   Yes Historical Provider, MD  spironolactone (ALDACTONE) 25 MG tablet Take 1 tablet (25 mg total) by mouth daily. 08/25/15  Yes Isaiah Serge, NP  traMADol (ULTRAM) 50 MG tablet Take one tablet by mouth twice daily for pain Patient taking differently: Take 50 mg by mouth 2 (two) times daily. Take one tablet by mouth twice daily for pain 04/16/15  Yes Gildardo Cranker, DO  Wheat Dextrin (BENEFIBER DRINK MIX PO) Take 1 Package by mouth 2 (two) times daily.   Yes Historical Provider, MD  furosemide (LASIX) 40 MG tablet Take 1 tablet (40 mg total) by mouth 2 (two) times daily. Patient not taking: Reported on 09/28/2015 08/25/15   Isaiah Serge, NP   PHYSICAL EXAM: Filed Vitals:   09/28/15 1455 09/28/15 1456 09/28/15 1530 09/28/15 1630  BP:  100/47 108/67 103/66  Pulse: 68  48 73  Temp:      TempSrc:      Resp: 22  16 18   Height:      Weight:      SpO2: 99%  100% 100%    Wt Readings from Last 3 Encounters:  09/28/15 65.318 kg (144 lb)  09/09/15 65.726 kg (144 lb 14.4 oz)  08/25/15 74.118 kg (163 lb 6.4 oz)    General:  White male. Agitated at nasal cannula, slapping at sheet.  Eyes: PER, normal lids, irises & conjunctiva. Bruise on left browbone ENT: grossly normal hearing, lips & tongue Neck: no LAD, masses Cardiovascular: irregular rhythm, normal rate.  No LE edema.  Respiratory: Respirations even and unlabored.  Normal respiratory effort. Lungs CTA bilaterally, no wheezes / rales .   Abdomen: soft, non-distended, non-tender, active bowel sounds. No obvious masses.  Skin: LLE - small ulcer inner leg mid way between knee and ankle. Another lesion (? laceration lateral aspect of LLE between knee and ankle.  Musculoskeletal: grossly normal tone BUE/BLE Psychiatric: agitated. Speech fluent  Neurologic: not oriented to place or time.Marland Kitchen  Labs on Admission:  Basic Metabolic Panel:  Recent Labs Lab 09/28/15 1232 09/28/15 1242  NA 133* 136  K 5.3* 5.2*  CL 100* 103  CO2 25  --   GLUCOSE 219* 217*  BUN 52* 50*  CREATININE 2.12* 2.10*  CALCIUM 9.0  --    Liver Function Tests:  Recent Labs Lab 09/28/15 1232  AST 20  ALT 10*  ALKPHOS 90  BILITOT 0.7  PROT 6.0*  ALBUMIN 3.1*    CBC:  Recent Labs Lab 09/28/15 1232 09/28/15 1242  WBC 9.1  --   NEUTROABS 6.7  --   HGB 13.3 13.9  HCT 40.2 41.0  MCV 87.6  --   PLT 145*  --     Radiological Exams on Admission: Ct Head Wo Contrast  09/28/2015  CLINICAL DATA:  Status post fall today with a hematoma about the left eye. Initial encounter. EXAM: CT HEAD WITHOUT CONTRAST TECHNIQUE: Contiguous axial images were obtained from the base of the skull through the vertex without intravenous contrast. COMPARISON:  Head CT scan 04/07/2015 and 03/22/2014. FINDINGS: The brain is atrophic with chronic microvascular ischemic change. Remote, large left MCA territory infarct is again seen. No evidence of acute infarction, hemorrhage, mass lesion, mass effect, midline shift or abnormal extra-axial fluid collection. No hydrocephalus or pneumocephalus. The calvarium is intact. Imaged paranasal sinuses and mastoid air cells are clear. IMPRESSION: No acute abnormality. Atrophy, chronic microvascular ischemic change remote left MCA infarct. Electronically Signed   By: Inge Rise M.D.   On: 09/28/2015 13:38   Dg Chest Port 1 View  09/28/2015  CLINICAL  DATA:  Fall, found unresponsive EXAM: PORTABLE CHEST 1 VIEW COMPARISON:  04/07/2015 FINDINGS: Cardiomediastinal silhouette is stable. Status post CABG. 4leads cardiac pacemaker unchanged in position. No acute infiltrate or pleural effusion. No pulmonary edema. No pneumothorax. IMPRESSION: No active disease. Electronically Signed   By: Lahoma Crocker M.D.   On: 09/28/2015 13:14    ASSESSMENT / PLAN    Principal Problem:   Sepsis ([place]) Active Problems:   CAD (coronary artery disease)   Permanent atrial fibrillation ([place])   ICD, biventricular, in situ - Medtronic Protecta 2013, leads 2009, defib portion has been turned to off   Hypertension associated with stage 3 chronic kidney disease due to type 2 diabetes mellitus ([place])   DM (diabetes mellitus) type II controlled with renal manifestation (HCC)   Mixed Alzheimer's and vascular dementia   Hypotension   Acute on chronic kidney failure ([place])   UTI (urinary tract infection)   Acute respiratory failure (HCC)   Fall at nursing home   Pressure ulcer   Hyperkalemia    Severe sepsis secondary to suspected pyelonephritis manifested by hypothermia, hypotension, elevated lactic acid.  -admit to stepdown -Blood and urine cultures -Change Rocephin to Zosyn -Received fluid resuscitation in ED  Acute respiratory arrest secondary to severe sepsis. Responded to rescue breathing. Now DNI / DNR. 02 sats now 100% on 3 liters. Was not on 02 prior to admission. Continue 02 per [place] to keep sats above 92%    UTI. Send urine for culture. Change Rocephin to Zosyn for broader coverage given sepsis.  Acute on chronic kidney disease. Baseline creatinine around 1.0, up to 2 today. Suspect decline in renal function secondary to sepsis / UTI  Hyperkalemia. Likely secondary to acute on chronic renal failure. Recheck BMET in am.   Fall at nursing home yesterday. Struck his head with left  forehead brusing. Head CTscan negative.  Severe ischemic cardiomyopathy /  Chronic combined systolic and diastolic heart failure . His ICD (defibrillator) tachyarrhythmia therapies are "off", the BIV pacer will continue to pace him. No signs of volume overload on CXR or physical exam. .Hold home cardiac meds secondary to hypotension   atrial fibrillation. Not a candidate for anticoagulation secondary to history of intracranial bleeding. EKG c/w with afib with rate control  CAD , s/p CABG. Continue home baby ASA. No chest pain at presenr  Dementia. Agitated on exam. Poor functioning at baseline. On Aricept. Per family patient can usually recognize wife, daughter and sister. He can have appropriate conversations at times. Will give prn Ativan.   Diabetes. Blood glucose 219. Implement SSI. Monitor CBGs.  DNR. Palliative Consult requested by EDP  Consultants-  Palliative Care  Code Status: DNR DVT Prophylaxis: Lovenox Family Communication: Wife, sister and daughter at bedside. They understand and agree with plan of care.  Disposition Plan: Back to [facility] in 2-3 days.   Time spent: 60 minutes Tye Savoy  NP [facility] Pager [phone]

## 2015-09-28 NOTE — ED Notes (Addendum)
MD at bedside discussing code status with wife and daughter

## 2015-09-28 NOTE — ED Notes (Signed)
Pt transported to CT with this CT. Pt tolerated well. Will continue to monitor.

## 2015-09-28 NOTE — ED Provider Notes (Addendum)
CSN: [id number]     Arrival date & time 09/28/15  1212 History   First MD Initiated Contact with Patient 09/28/15 1219     Chief Complaint  Patient presents with  . Altered Mental Status   Level V caveat  (Consider location/radiation/quality/duration/timing/severity/associated sxs/prior Treatment) HPI  81 year old male presents today from nursing home with report of prehospital apnea and unresponsiveness. History is obtained from EMS then from his wife after her arrival. EMS reports that they were called by the nursing home with a report that the patient was apneic and unresponsive and staff and started CPR. EMS reports upon their arrival patient was unresponsive and they assisted ventilations. However, pulses were present and no CPR was done. A gated the patient 500 mL of normal saline and route. Patient became much more alert and told them to leave him alone. On arrival here he is breathing well on his own. Report is that he fell yesterday and has a contusion of his left forehead and his knee.  After his wife's arrival, arrival she reports that his baseline status is oriented to her but otherwise confused. He has been nonambulatory at baseline for a while. Over the past couple of days he has had increased sleepiness and decreased appetite.  Past Medical History  Diagnosis Date  . Hypertension   . Ischemic heart disease   . Hyperlipemia   . Depression   . Testicular cancer ([place])   . Polio     child polio  . Rheumatic fever   . Mild dementia   . Mild dementia   . Systolic heart failure (HCC)     acute on chronic  . CAD (coronary artery disease)   . Chronic atrial fibrillation ([place])   . Vitamin D deficiency   . Type II or unspecified type diabetes mellitus without mention of complication, not stated as uncontrolled   . Other testicular hypofunction   . Permanent atrial fibrillation ([place]) 01/21/2014  . ICD, biventricular, in situ - Medtronic Protecta 2013, leads 2009 01/21/2014    New  Generator Medtronic [place] model number [id number], serial number [id number]  Right atrial lead (chronic) Medtronic, model number [id number], serial [id number] (implanted 05/01/2008)  Right ventricular lead (chronic) Medtronic, model number [id number], serial number [id number] (implanted 05/01/2008)  Left ventricular lead Medtronic, model number [id number], serial number [id number] (implanted 05/01/2008)  Exp  . Pleural effusion, right 03/18/2014  . Emphysema 03/18/2014  . Pulmonary infiltrate 05/10/2014    CT chest 03/2014:  RUL interstitial/GGO    Past Surgical History  Procedure Laterality Date  . Coronary artery bypass graft  06/26/2002    LIMA to diagonal artery & SVG to the first obtuse margin  . Nm myocar perf wall motion  09/15/2006    Dilated LV w/small area of possible nontransmural inferior scar w/mild perinfarct ischemia.  No significant change from previous study.  . Cardiac defibrillator placement  05/01/2008    Medtronic  . Biv icd genertaor change out N/A 10/27/2012    Procedure: BIV ICD GENERTAOR CHANGE OUT;  Surgeon: Sanda Klein, MD;  Location: [facility] CATH LAB;  Service: Cardiovascular;  Laterality: N/A;  . Intramedullary (im) nail intertrochanteric Right 04/07/2015    Procedure: INTRAMEDULLARY (IM) NAIL INTERTROCHANTRIC;  Surgeon: Netta Cedars, MD;  Location: [place];  Service: Orthopedics;  Laterality: Right;   Family History  Problem Relation Age of Onset  . Other Mother   . Heart attack Father    Social History  Substance Use Topics  . Smoking status: Former  Smoker    Quit date: 12/24/1968  . Smokeless tobacco: Never Used  . Alcohol Use: No    Review of Systems  Unable to perform ROS: Dementia      Allergies  Lipitor  Home Medications   Prior to Admission medications   Medication Sig Start Date End Date Taking? Authorizing Provider  acetaminophen (TYLENOL) 325 MG tablet Take 2 tablets (650 mg total) by mouth 3 (three) times daily. 08/25/15  Yes Isaiah Serge, NP  aspirin 81 MG  tablet Take 81 mg by mouth daily.   Yes Historical Provider, MD  bisacodyl (DULCOLAX) 10 MG suppository Place 1 suppository (10 mg total) rectally daily as needed for moderate constipation. 04/10/15  Yes Velvet Bathe, MD  Cholecalciferol (VITAMIN D3) 2000 UNITS TABS Take 4,000 Units by mouth daily.    Yes Historical Provider, MD  citalopram (CELEXA) 20 MG tablet Take 20 mg by mouth daily.   Yes Historical Provider, MD  Cyanocobalamin (VITAMIN B-12 PO) Take 1,000 mcg by mouth daily.    Yes Historical Provider, MD  donepezil (ARICEPT) 23 MG TABS tablet Take 1 tablet (23 mg total) by mouth at bedtime. 10/07/14  Yes Garvin Fila, MD  dorzolamide-timolol (COSOPT) 22.3-6.8 MG/ML ophthalmic solution Place 1 drop into both eyes 2 (two) times daily.   Yes Historical Provider, MD  Ferrous Fumarate 324 (106 FE) MG TABS Take 324 mg by mouth daily.   Yes Historical Provider, MD  HYDROcodone-acetaminophen (NORCO/VICODIN) 5-325 MG per tablet Take one tablet by mouth every four hours as needed for pain. Do not exceed 4grams of Tylenol in 24 hours 05/19/15  Yes Tiffany L Reed, DO  insulin detemir (LEVEMIR) 100 UNIT/ML injection Inject 0.05 mLs (5 Units total) into the skin at bedtime. 08/25/15  Yes Isaiah Serge, NP  insulin lispro (HUMALOG) 100 UNIT/ML injection Check blood sugar three times a day before meals with sliding scale 5 units for cbg 250-350 8 units for cbg >350-450 10 units for cbg>450   Yes Historical Provider, MD  isosorbide mononitrate (IMDUR) 30 MG 24 hr tablet Take 90 mg by mouth daily. For Hypertension   Yes Historical Provider, MD  KLOR-CON M10 10 MEQ tablet TAKE 1 TABLET TWICE DAILY 01/22/15  Yes Unk Pinto, MD  levalbuterol [facility]) 0.63 MG/3ML nebulizer solution Take 3 mLs (0.63 mg total) by nebulization every 6 (six) hours as needed for wheezing or shortness of breath. 08/25/15  Yes Isaiah Serge, NP  levothyroxine (SYNTHROID, LEVOTHROID) 75 MCG tablet TAKE 1 TABLET EVERY DAY 10/29/13   Yes Melissa Smith, PA-C  linagliptin (TRADJENTA) 5 MG TABS tablet Take 5 mg by mouth daily.   Yes Historical Provider, MD  lisinopril (PRINIVIL,ZESTRIL) 20 MG tablet Take 1 tablet (20 mg total) by mouth daily. 08/25/15  Yes Isaiah Serge, NP  loratadine (CLARITIN) 10 MG tablet Take 10 mg by mouth daily.   Yes Historical Provider, MD  LORazepam (ATIVAN) 0.5 MG tablet Take one tablet by mouth every night at bedtime for anxiety Patient taking differently: Take 0.5 mg by mouth at bedtime. Take one tablet by mouth every night at bedtime for anxiety 09/12/15  Yes Gildardo Cranker, DO  Metoprolol Tartrate 75 MG TABS Take 150 mg by mouth 2 (two) times daily. 08/25/15  Yes Isaiah Serge, NP  nystatin cream (MYCOSTATIN) Apply 1 application topically 2 (two) times daily. 08/26/15  Yes Historical Provider, MD  pravastatin (PRAVACHOL) 40 MG tablet TAKE 1 TABLET EVERY DAY FOR CHOLESTEROL 08/23/14  Yes  Unk Pinto, MD  Propylene Glycol (SYSTANE BALANCE) 0.6 % SOLN Apply 1 drop to eye 3 (three) times daily.   Yes Historical Provider, MD  spironolactone (ALDACTONE) 25 MG tablet Take 1 tablet (25 mg total) by mouth daily. 08/25/15  Yes Isaiah Serge, NP  traMADol (ULTRAM) 50 MG tablet Take one tablet by mouth twice daily for pain Patient taking differently: Take 50 mg by mouth 2 (two) times daily. Take one tablet by mouth twice daily for pain 04/16/15  Yes Gildardo Cranker, DO  Wheat Dextrin (BENEFIBER DRINK MIX PO) Take 1 Package by mouth 2 (two) times daily.   Yes Historical Provider, MD  furosemide (LASIX) 40 MG tablet Take 1 tablet (40 mg total) by mouth 2 (two) times daily. Patient not taking: Reported on 09/28/2015 08/25/15   Isaiah Serge, NP   BP 100/47 mmHg  Pulse 68  Temp(Src) 96.8 F (36 C) (Rectal)  Resp 22  Ht 5\' 7"  (1.702 m)  Wt 144 lb (65.318 kg)  BMI 22.55 kg/m2  SpO2 99% Physical Exam  Constitutional: He appears well-developed and well-nourished.  HENT:  Head: Normocephalic.  Contusion above  left eyebrow  Eyes: Conjunctivae and EOM are normal. Pupils are equal, round, and reactive to light.  Neck: Normal range of motion. Neck supple.  Cardiovascular: Normal rate and normal heart sounds.   Pulmonary/Chest: Effort normal and breath sounds normal.  Abdominal: Soft. Bowel sounds are normal.  Neurological: He is alert. He displays normal reflexes. No cranial nerve deficit. Coordination normal.  Skin: Skin is warm.  Nursing note and vitals reviewed.   ED Course  Procedures (including critical care time) Labs Review Labs Reviewed  CBC WITH DIFFERENTIAL/PLATELET - Abnormal; Notable for the following:    RDW 15.6 (*)    Platelets 145 (*)    All other components within normal limits  COMPREHENSIVE METABOLIC PANEL - Abnormal; Notable for the following:    Sodium 133 (*)    Potassium 5.3 (*)    Chloride 100 (*)    Glucose, Bld 219 (*)    BUN 52 (*)    Creatinine, Ser 2.12 (*)    Total Protein 6.0 (*)    Albumin 3.1 (*)    ALT 10 (*)    GFR calc non Af Amer 28 (*)    GFR calc Af Amer 32 (*)    All other components within normal limits  URINALYSIS, ROUTINE W REFLEX MICROSCOPIC (NOT AT [facility]) - Abnormal; Notable for the following:    APPearance CLOUDY (*)    Glucose, UA 100 (*)    Leukocytes, UA LARGE (*)    All other components within normal limits  URINE MICROSCOPIC-ADD ON - Abnormal; Notable for the following:    Casts HYALINE CASTS (*)    All other components within normal limits  I-STAT CHEM 8, ED - Abnormal; Notable for the following:    Potassium 5.2 (*)    BUN 50 (*)    Creatinine, Ser 2.10 (*)    Glucose, Bld 217 (*)    All other components within normal limits  I-STAT CG4 LACTIC ACID, ED - Abnormal; Notable for the following:    Lactic Acid, Venous 2.04 (*)    All other components within normal limits  I-STAT ARTERIAL BLOOD GAS, ED - Abnormal; Notable for the following:    pH, Arterial 7.296 (*)    pO2, Arterial 129.0 (*)    Acid-base deficit 6.0 (*)    All  other components within normal  limits  BLOOD GAS, ARTERIAL  I-STAT TROPOININ, ED  I-STAT CG4 LACTIC ACID, ED    Imaging Review Ct Head Wo Contrast  09/28/2015  CLINICAL DATA:  Status post fall today with a hematoma about the left eye. Initial encounter. EXAM: CT HEAD WITHOUT CONTRAST TECHNIQUE: Contiguous axial images were obtained from the base of the skull through the vertex without intravenous contrast. COMPARISON:  Head CT scan 04/07/2015 and 03/22/2014. FINDINGS: The brain is atrophic with chronic microvascular ischemic change. Remote, large left MCA territory infarct is again seen. No evidence of acute infarction, hemorrhage, mass lesion, mass effect, midline shift or abnormal extra-axial fluid collection. No hydrocephalus or pneumocephalus. The calvarium is intact. Imaged paranasal sinuses and mastoid air cells are clear. IMPRESSION: No acute abnormality. Atrophy, chronic microvascular ischemic change remote left MCA infarct. Electronically Signed   By: Inge Rise M.D.   On: 09/28/2015 13:38   Dg Chest Port 1 View  09/28/2015  CLINICAL DATA:  Fall, found unresponsive EXAM: PORTABLE CHEST 1 VIEW COMPARISON:  04/07/2015 FINDINGS: Cardiomediastinal silhouette is stable. Status post CABG. 4leads cardiac pacemaker unchanged in position. No acute infiltrate or pleural effusion. No pulmonary edema. No pneumothorax. IMPRESSION: No active disease. Electronically Signed   By: Lahoma Crocker M.D.   On: 09/28/2015 13:14   I have personally reviewed and evaluated these images and lab results as part of my medical decision-making.   EKG Interpretation   Date/Time:  Sunday September 28 2015 12:23:48 EDT Ventricular Rate:  62 PR Interval:    QRS Duration: 196 QT Interval:  532 QTC Calculation: 540 R Axis:   -89 Text Interpretation:  Atrial fibrillation Ventricular bigeminy Right  bundle branch block Probable left ventricular hypertrophy Prolonged QT  interval Confirmed by Kodie Kishi MD, Arvo Ealy ([id number])  on 09/28/2015 4:41:30 PM      MDM   Final diagnoses:  UTI (lower urinary tract infection)  Hypotension, unspecified hypotension type  Metabolic acidemia     81  year old male who is reported to have a respiratory arrest which resolved after a cyst. There is also reported that he was unconscious and CPR was performed at the nursing home. Patient's wife does not want patient to have CPR or intubation. I discussed DO NOT RESUSCITATE orders with her and signed them here in the department. She also does not wish vasoactive pressors or antiarrhythmics. She does wish him to have treatment of any conditions today that are not particularly invasive or painful. His evaluation here reveals that he likely has urinary tract infection. IV antibiotics and IV fluids are ordered. Patient is to be admitted for further evaluation and treatment. Palliative care consult is ensuing.  1- fall- head abrasion- ct without evidence of ich 2- ams/arrest- patient with decreased responsiveness and hypotension.  Patient receiving iv fluids and has had some increase in bp.  Patient with metabolic acidosis c.w. Episode of severe hypotension/arrest 3- uti- patient with 21-50 wbc on ua cw uti which may have precipitated 1 and 2.  Patient received iv rocephin.  He is not being aggressively resuscitated per sepsis protocol as now dnr 4-renal insufficiency creatinine today at 2.1 with last prior normal.   Discussed with Nevin Bloodgood on call for hospitalist and will admit to tele.   CRITICAL CARE Performed by: Shaune Pollack Total critical care time: 75 Critical care time was exclusive of separately billable procedures and treating other patients. Critical care was necessary to treat or prevent imminent or life-threatening deterioration. Critical care was time spent personally by me on  the following activities: development of treatment plan with patient and/or surrogate as well as nursing, discussions with consultants, evaluation of  patient's response to treatment, examination of patient, obtaining history from patient or surrogate, ordering and performing treatments and interventions, ordering and review of laboratory studies, ordering and review of radiographic studies, pulse oximetry and re-evaluation of patient's condition.   Pattricia Boss, MD 09/28/15 2500  Pattricia Boss, MD 09/28/15 [phone]

## 2015-09-29 DIAGNOSIS — E875 Hyperkalemia: Secondary | ICD-10-CM

## 2015-09-29 LAB — BASIC METABOLIC PANEL
ANION GAP: 8 (ref 5–15)
BUN: 43 mg/dL — ABNORMAL HIGH (ref 6–20)
CO2: 24 mmol/L (ref 22–32)
Calcium: 8.8 mg/dL — ABNORMAL LOW (ref 8.9–10.3)
Chloride: 104 mmol/L (ref 101–111)
Creatinine, Ser: 1.7 mg/dL — ABNORMAL HIGH (ref 0.61–1.24)
GFR calc Af Amer: 42 mL/min — ABNORMAL LOW (ref >60)
GFR calc non Af Amer: 36 mL/min — ABNORMAL LOW (ref >60)
GLUCOSE: 104 mg/dL — AB (ref 65–99)
POTASSIUM: 4.6 mmol/L (ref 3.5–5.1)
Sodium: 136 mmol/L (ref 135–145)

## 2015-09-29 LAB — GLUCOSE, CAPILLARY
GLUCOSE-CAPILLARY: 102 mg/dL — AB (ref 65–99)
GLUCOSE-CAPILLARY: 89 mg/dL (ref 65–99)
GLUCOSE-CAPILLARY: 118 mg/dL — AB (ref 65–99)
GLUCOSE-CAPILLARY: 195 mg/dL — AB (ref 65–99)
Glucose-Capillary: 95 mg/dL (ref 65–99)
Glucose-Capillary: 174 mg/dL — ABNORMAL HIGH (ref 65–99)

## 2015-09-29 LAB — LACTIC ACID, PLASMA: Lactic Acid, Venous: 1.8 mmol/L (ref 0.5–2.0)

## 2015-09-29 MED ORDER — MUPIROCIN CALCIUM 2 % EX CREA
TOPICAL_CREAM | Freq: Every day | CUTANEOUS | Status: DC
  Administered 2015-09-29 – 2015-10-01 (×3): via TOPICAL
  Filled 2015-09-29: qty 15

## 2015-09-29 MED ORDER — DEXTROSE 5 % IV SOLN
1.0000 g | INTRAVENOUS | Status: DC
  Administered 2015-09-29: 1 g via INTRAVENOUS
  Filled 2015-09-29 (×3): qty 10

## 2015-09-29 NOTE — Progress Notes (Addendum)
Patient Demographics  Troy Warren, is a 81 y.o. male, DOB - June 23, 1931, ZOX:[id number]  Admit date - 09/28/2015   Admitting Physician Norval Morton, MD  Outpatient Primary MD for the patient is Blanchie Serve, MD  LOS - 1   Chief Complaint  Patient presents with  . Altered Mental Status       Admission HPI/Brief narrative: 81 y.o. Male with history of chronic combined systolic and diastolic heart failure, atrial fibrillation, AICD, hypertension, diabetes. Recent hospitalization for acute CHF He was diuresed with a 19 pound weight loss. Patient with an episode of unresponsiveness at SNF, CPR was initiated by SNF staff, found to have pulse by EMS, NAD patient was back at baseline, made DO NOT RESUSCITATE by ED physician, workup was significant for UTI.  Subjective:   Troy Warren today has, No headache, No chest pain, No abdominal pain - No Nausea,  No Cough - SOB.   Assessment & Plan    Principal Problem:   Sepsis ([place]) Active Problems:   CAD (coronary artery disease)   Permanent atrial fibrillation ([place])   ICD, biventricular, in situ - Medtronic Protecta 2013, leads 2009, defib portion has been turned to off   Hypertension associated with stage 3 chronic kidney disease due to type 2 diabetes mellitus ([place])   DM (diabetes mellitus) type II controlled with renal manifestation (HCC)   Mixed Alzheimer's and vascular dementia   Hypotension   Acute on chronic kidney failure ([place])   UTI (urinary tract infection)   Acute respiratory failure (HCC)   Fall at nursing home   Pressure ulcer   Hyperkalemia  Sepsis secondary to UTI - Patient was hypotensive, hypothermic, negative chest x-ray, urinalysis significant for UTI. - Discontinue Zosyn, start on Rocephin, follow on urine cultures  Questionable cardiopulmonary arrest/more likely acute respiratory arrest - Unclear if patient lost  pulse, currently is DO NOT RESUSCITATE, significant events on telemetry, requested cardiology to interrogate  AICD. - (His ICD (defibrillator) tachyarrhythmia therapies are "off", the BIV pacer will continue to pace him), as per cardiology recent discharge - Continue with oxygen as needed  Acute renal failure - Most likely related to ATN from hypotension, avoid nephrotoxic medication, continue with IV fluid, improving, creatinine is 1.7 today.  Hyperkalemia - Resolved, potassium is 4.6 today  Dementia - Continue with donepezial  Severe ischemic cardiomyopathy with chronic combined systolic and diastolic heart failure - Currently appears to be euvolemic, denies any chest pain or shortness of breath, continue with aspirin. - We'll resume on ACE inhibitor, beta blockers, Imdur, Lasix,  if blood pressure remains stable, and renal function continues to improve  Hypothyroidism - Attenuation with Synthroid  Diabetes mellitus - Continue with  Lantus and insulin sliding scale.  Atrial fibrillation - Not candidate for anticoagulation secondary to history of intracranial bleed  CAD , s/p CABG.  - Continue home baby ASA. No chest pain at presenr  Code Status: DO NOT RESUSCITATE  Family Communication: Sister at bedside  Disposition Plan: Active SNF in 24-48 hours if remains stable   Procedures  None   Consults   Requested cardiology to interrogate AICD   Medications  Scheduled Meds: . aspirin EC  81 mg Oral Daily  . citalopram  20 mg  Oral Daily  . donepezil  23 mg Oral QHS  . dorzolamide-timolol  1 drop Both Eyes BID  . heparin  5,000 Units Subcutaneous 3 times per day  . insulin aspart  0-9 Units Subcutaneous 6 times per day  . insulin detemir  5 Units Subcutaneous QHS  . levothyroxine  75 mcg Oral QAC breakfast  . piperacillin-tazobactam (ZOSYN)  IV  3.375 g Intravenous 3 times per day  . polyvinyl alcohol  1 drop Both Eyes TID  . sodium chloride  1,000 mL Intravenous  Once  . sodium chloride  3 mL Intravenous Q12H   Continuous Infusions:  PRN Meds:.bisacodyl, HYDROcodone-acetaminophen, levalbuterol, LORazepam, ondansetron **OR** ondansetron (ZOFRAN) IV  DVT Prophylaxis   Heparin -   Lab Results  Component Value Date   PLT 145* 09/28/2015    Antibiotics    Anti-infectives    Start     Dose/Rate Route Frequency Ordered Stop   09/29/15 0300  piperacillin-tazobactam (ZOSYN) IVPB 3.375 g     3.375 g 12.5 mL/hr over 240 Minutes Intravenous 3 times per day 09/28/15 1955     09/28/15 2200  piperacillin-tazobactam (ZOSYN) IVPB 3.375 g  Status:  Discontinued     3.375 g 100 mL/hr over 30 Minutes Intravenous 3 times per day 09/28/15 1952 09/28/15 1953   09/28/15 2030  piperacillin-tazobactam (ZOSYN) IVPB 3.375 g     3.375 g 100 mL/hr over 30 Minutes Intravenous  Once 09/28/15 1955 09/28/15 2100   09/28/15 1615  cefTRIAXone (ROCEPHIN) 1 g in dextrose 5 % 50 mL IVPB     1 g 100 mL/hr over 30 Minutes Intravenous  Once 09/28/15 1602 09/28/15 1901          Objective:   Filed Vitals:   09/29/15 0129 09/29/15 0329 09/29/15 0443 09/29/15 0529  BP: 133/81 136/62  126/64  Pulse: 82 43  44  Temp:   98.8 F (37.1 C)   TempSrc:      Resp: 14 17  17   Height:      Weight:      SpO2: 100% 99%  98%    Wt Readings from Last 3 Encounters:  09/28/15 65.318 kg (144 lb)  09/09/15 65.726 kg (144 lb 14.4 oz)  08/25/15 74.118 kg (163 lb 6.4 oz)     Intake/Output Summary (Last 24 hours) at 09/29/15 1129 Last data filed at 09/28/15 1455  Gross per 24 hour  Intake    500 ml  Output      0 ml  Net    500 ml     Physical Exam  Awake Alert,  Supple Neck,No JVD,  Symmetrical Chest wall movement, fair air movement bilaterally,  No Gallops,Rubs or new Murmurs,  +ve B.Sounds, Abd Soft, No tenderness, No Cyanosis, Clubbing or edema, No new Rash or bruise     Data Review   Micro Results No results found for this or any previous visit (from the past  240 hour(s)).  Radiology Reports Ct Head Wo Contrast  09/28/2015  CLINICAL DATA:  Status post fall today with a hematoma about the left eye. Initial encounter. EXAM: CT HEAD WITHOUT CONTRAST TECHNIQUE: Contiguous axial images were obtained from the base of the skull through the vertex without intravenous contrast. COMPARISON:  Head CT scan 04/07/2015 and 03/22/2014. FINDINGS: The brain is atrophic with chronic microvascular ischemic change. Remote, large left MCA territory infarct is again seen. No evidence of acute infarction, hemorrhage, mass lesion, mass effect, midline shift or abnormal extra-axial fluid collection.  No hydrocephalus or pneumocephalus. The calvarium is intact. Imaged paranasal sinuses and mastoid air cells are clear. IMPRESSION: No acute abnormality. Atrophy, chronic microvascular ischemic change remote left MCA infarct. Electronically Signed   By: Inge Rise M.D.   On: 09/28/2015 13:38   Dg Chest Port 1 View  09/28/2015  CLINICAL DATA:  Fall, found unresponsive EXAM: PORTABLE CHEST 1 VIEW COMPARISON:  04/07/2015 FINDINGS: Cardiomediastinal silhouette is stable. Status post CABG. 4leads cardiac pacemaker unchanged in position. No acute infiltrate or pleural effusion. No pulmonary edema. No pneumothorax. IMPRESSION: No active disease. Electronically Signed   By: Lahoma Crocker M.D.   On: 09/28/2015 13:14     CBC  Recent Labs Lab 09/28/15 1232 09/28/15 1242  WBC 9.1  --   HGB 13.3 13.9  HCT 40.2 41.0  PLT 145*  --   MCV 87.6  --   MCH 29.0  --   MCHC 33.1  --   RDW 15.6*  --   LYMPHSABS 1.2  --   MONOABS 0.9  --   EOSABS 0.2  --   BASOSABS 0.0  --     Chemistries   Recent Labs Lab 09/28/15 1232 09/28/15 1242 09/29/15 0315  NA 133* 136 136  K 5.3* 5.2* 4.6  CL 100* 103 104  CO2 25  --  24  GLUCOSE 219* 217* 104*  BUN 52* 50* 43*  CREATININE 2.12* 2.10* 1.70*  CALCIUM 9.0  --  8.8*  AST 20  --   --   ALT 10*  --   --   ALKPHOS 90  --   --   BILITOT  0.7  --   --    ------------------------------------------------------------------------------------------------------------------ estimated creatinine clearance is 31.5 mL/min (by C-G formula based on Cr of 1.7). ------------------------------------------------------------------------------------------------------------------ No results for input(s): HGBA1C in the last 72 hours. ------------------------------------------------------------------------------------------------------------------ No results for input(s): CHOL, HDL, LDLCALC, TRIG, CHOLHDL, LDLDIRECT in the last 72 hours. ------------------------------------------------------------------------------------------------------------------ No results for input(s): TSH, T4TOTAL, T3FREE, THYROIDAB in the last 72 hours.  Invalid input(s): FREET3 ------------------------------------------------------------------------------------------------------------------ No results for input(s): VITAMINB12, FOLATE, FERRITIN, TIBC, IRON, RETICCTPCT in the last 72 hours.  Coagulation profile  Recent Labs Lab 09/28/15 2037  INR 1.16    No results for input(s): DDIMER in the last 72 hours.  Cardiac Enzymes No results for input(s): CKMB, TROPONINI, MYOGLOBIN in the last 168 hours.  Invalid input(s): CK ------------------------------------------------------------------------------------------------------------------ Invalid input(s): POCBNP     Time Spent in minutes   35 minutes   Keeya Dyckman M.D on 09/29/2015 at 11:29 AM  Between 7am to 7pm - Pager - [phone]  After 7pm go to [email or web address] - password [facility]  [facility]   Office  [phone]

## 2015-09-29 NOTE — Clinical Social Work Note (Signed)
Clinical Social Work Assessment  Patient Details  Name: Troy Warren MRN: [id number] Date of Birth: 1931/11/12  Date of referral:  09/29/15               Reason for consult:  Facility Placement, Discharge Planning                Permission sought to share information with:  Family Supports, Customer service manager Permission granted to share information::  No (Patient is confused)  Name::     Troy Warren, [place]::  Troy [facility]  Relationship::     Contact Information:     Housing/Transportation Living arrangements for the past 2 months:  [place] of Information:  Adult Children Patient Interpreter Needed:  None Criminal Activity/Legal Involvement Pertinent to Current Situation/Hospitalization:  No - Comment as needed Significant Relationships:  Adult Children, Spouse Lives with:  Facility Resident Do you feel safe going back to the place where you live?  Yes Need for family participation in patient care:  Yes (Comment)  Care giving concerns:  Patient's family (daughter Troy Warren) does not report any concerns at this time.   Social Worker assessment / plan:  CSW spoke with daughter Troy Warren to complete assessment. Attempts to reach Troy Warren were made. Per Troy Warren, the wife is very [facility] and the daughter Troy Warren is currently in the hospital for her chemo for breast cancer. Troy Warren asks to be contacted by the team. Per Troy Warren the patient has been a resident of [facility] for a year. The family is happy with the care the patient receives at the facility and wishes for him to return when discharged. Troy Warren does have questions regarding palliative care and looks forward to having a discussion with out team. CSW explained that if palliative or hospice services were needed after discharge these could continue for the patient at [facility] if the family would like this. CSW will update [facility] on patient and continue to  follow.  Employment status:  Disabled (Comment on whether or not currently receiving Disability), Retired Forensic scientist:  Medicaid In [place], [employer] PT Recommendations:  Not assessed at this time Information / Referral to community resources:  [place]  Patient/Family's Response to care:  Patient's family appears to be happy with the care the patient has received.  Patient/Family's Understanding of and Emotional Response to Diagnosis, Current Treatment, and Prognosis:  Patients family has good understanding of the reason for the patient's admission and the patient's diagnosis. The family is open to goals of care discussion with the PMT.   Emotional Assessment Appearance:  Appears stated age Attitude/Demeanor/Rapport:  Unable to Assess Affect (typically observed):  Unable to Assess Orientation:  Oriented to Self Alcohol / Substance use:  Tobacco Use (Hx of) Psych involvement (Current and /or in the community):  No (Comment)  Discharge Needs  Concerns to be addressed:  Discharge Planning Concerns Readmission within the last 30 days:  No Current discharge risk:  Chronically ill, Physical Impairment, Cognitively Impaired Barriers to Discharge:  Continued Medical Work up   [employer] MSW, [place], [place], [phone]

## 2015-09-29 NOTE — Progress Notes (Signed)
Utilization review completed. Alyas Creary, RN, BSN. 

## 2015-09-29 NOTE — Consult Note (Addendum)
WOC wound consult note Reason for Consult: Consult requested for multiple wounds of unknown etiology to LLE.  No family present and pt is unable to provide information regarding wounds. Wound type: Several full thickness wounds, some scattered partial thickness wounds. Measurement: Left upper leg with 2 partial thickness wounds; .8X.8cm and 1X.2cm; both dark brown and dry without odor or drainage, no depth. Left lower posterior leg with several full thickness wounds; .3X.3X.1cm; .2X.2X.1cm, .2X.2X.1cm. All are yellow, round, with "punched out appearance" which may indicate arterial insufficiency. No odor or drainage. Back of leg 2X.5X.2cm, .3X.3X.1cm, .2X.3X.1cm.  All are mostly yellow and dry, irregular in shape, no odor or drainage. Dressing procedure/placement/frequency: Bactroban to promote moist healing.  Foam dressings to protect from further injury.   Please re-consult if further assistance is needed.  Thank-you,  Julien Girt MSN, [place], [place], [place], [place]

## 2015-09-30 DIAGNOSIS — Z515 Encounter for palliative care: Secondary | ICD-10-CM

## 2015-09-30 DIAGNOSIS — R52 Pain, unspecified: Secondary | ICD-10-CM

## 2015-09-30 LAB — CBC
HEMATOCRIT: 42.4 % (ref 39.0–52.0)
HEMOGLOBIN: 13.9 g/dL (ref 13.0–17.0)
MCH: 29 pg (ref 26.0–34.0)
MCHC: 32.8 g/dL (ref 30.0–36.0)
MCV: 88.3 fL (ref 78.0–100.0)
PLATELETS: 128 10*3/uL — AB (ref 150–400)
RBC: 4.8 MIL/uL (ref 4.22–5.81)
RDW: 15.7 % — ABNORMAL HIGH (ref 11.5–15.5)
WBC: 9 10*3/uL (ref 4.0–10.5)

## 2015-09-30 LAB — BASIC METABOLIC PANEL
Anion gap: 8 (ref 5–15)
BUN: 30 mg/dL — AB (ref 6–20)
CHLORIDE: 111 mmol/L (ref 101–111)
CO2: 24 mmol/L (ref 22–32)
Calcium: 9.3 mg/dL (ref 8.9–10.3)
Creatinine, Ser: 1.29 mg/dL — ABNORMAL HIGH (ref 0.61–1.24)
GFR calc Af Amer: 58 mL/min — ABNORMAL LOW (ref >60)
GFR calc non Af Amer: 50 mL/min — ABNORMAL LOW (ref >60)
GLUCOSE: 89 mg/dL (ref 65–99)
POTASSIUM: 4.4 mmol/L (ref 3.5–5.1)
Sodium: 143 mmol/L (ref 135–145)

## 2015-09-30 LAB — URINE CULTURE

## 2015-09-30 LAB — GLUCOSE, CAPILLARY
GLUCOSE-CAPILLARY: 131 mg/dL — AB (ref 65–99)
Glucose-Capillary: 173 mg/dL — ABNORMAL HIGH (ref 65–99)
Glucose-Capillary: 201 mg/dL — ABNORMAL HIGH (ref 65–99)
Glucose-Capillary: 255 mg/dL — ABNORMAL HIGH (ref 65–99)
Glucose-Capillary: 84 mg/dL (ref 65–99)
Glucose-Capillary: 85 mg/dL (ref 65–99)

## 2015-09-30 MED ORDER — INSULIN ASPART 100 UNIT/ML ~~LOC~~ SOLN: 0.0000 [IU] | Freq: Three times a day (TID) | SUBCUTANEOUS | Status: DC

## 2015-09-30 MED ORDER — INSULIN ASPART 100 UNIT/ML ~~LOC~~ SOLN
0.0000 [IU] | Freq: Three times a day (TID) | SUBCUTANEOUS | Status: DC
  Administered 2015-09-30: 3 [IU] via SUBCUTANEOUS
  Administered 2015-09-30: 5 [IU] via SUBCUTANEOUS
  Administered 2015-10-01 (×2): 2 [IU] via SUBCUTANEOUS

## 2015-09-30 MED ORDER — MORPHINE SULFATE (CONCENTRATE) 10 MG/0.5ML PO SOLN
5.0000 mg | ORAL | Status: DC | PRN
Start: 2015-09-30 — End: 2015-10-01

## 2015-09-30 MED ORDER — MORPHINE SULFATE (CONCENTRATE) 10 MG/0.5ML PO SOLN
5.0000 mg | Freq: Four times a day (QID) | ORAL | Status: DC
  Administered 2015-09-30 – 2015-10-01 (×3): 5 mg via ORAL
  Filled 2015-09-30 (×4): qty 0.5

## 2015-09-30 NOTE — Consult Note (Signed)
Consultation Note Date: 09/30/2015   Patient Name: Troy Warren  DOB: 1931/08/07  MRN: [id number]  Age / Sex: 81 y.o., male   PCP: Blanchie Serve, MD Referring Physician: Albertine Patricia, MD  Reason for Consultation: Establishing goals of care  Palliative Care Assessment and Plan Summary of Established Goals of Care and Medical Treatment Preferences    Palliative Care Discussion Held Today:    This NP Wadie Lessen reviewed medical records, received report from team, assessed the patient and then meet at the patient's bedside along with his wife  to discuss diagnosis, prognosis, GOC, EOL wishes disposition and options.  A detailed discussion was had today regarding advanced directives.  Concepts specific to code status, artifical feeding and hydration, continued IV antibiotics and rehospitalization was had.  The difference between a aggressive medical intervention path  and a palliative comfort care path for this patient at this time was had.  Values and goals of care important to patient and family were attempted to be elicited.  Concept of Hospice and Palliative Care were discussed  Natural trajectory and expectations at EOL were discussed.  Questions and concerns addressed.   Family encouraged to call with questions or concerns.  PMT will continue to support holistically.    Primary Decision Maker: wife Trice Aspinall  [phone]  Goals of Care/Code Status/Advance Care Planning:   Code Status: DNR/DNI-comfort is main focus of care  Artificial feeding/hydration:not now or in the future  Antibiotics: none  Diagnostics:none  Rehospitalization:avoid  Comfort and dignity is the focus of care.  No further life prolonging measures   Symptom Management:    Pain: Chronic generalized pain-Roxanol 5 mg po/sl every 6 hrs  Anticipatory Dyspnea and Pain: Roxanol 5 mg po/sl every 2 hrs prn  Psycho-social/Spiritual:    Support System: wife and  step-daughters   Discharge Planning: Evaliate in morning, family is hopefu lfor hospice facility        Chief Complaint: Per SNF, respiratory arrest  History of Present Illness:  Troy Warren is a 81 y.o. male with multiple medical problems not limited to chronic combined systolic and diastolic heart failure, atrial fibrillation, biventricular ICD placement, hypertension, diabetes. Patient was hospitalized early September for almost a week for acute on chronic combined systolic and diastolic heart failure. He was diuresed with a 19 pound weight loss.  This morning patient was found by nursing home staff to be unresponsive / not breathing. Nursing home staff initiated CPR but when EMS arrived an patient determined to have a pulse, chest compressions were discontinued. Bag/valve rescue breathing initiated, IVF started. Patient became responsive on way to ED.   Patient has dementia and is confused at baseline.  Continued physical, functional and cognitive decline.  Family faced with advanced directive and anticipatory care needs.    Primary Diagnoses  Present on Admission:  . Hypotension . CAD (coronary artery disease) . DM (diabetes mellitus) type II controlled with renal manifestation ([place]) . Hypertension associated with stage 3 chronic kidney disease due to type 2 diabetes mellitus ([place]) . ICD, biventricular, in situ - Medtronic Protecta 2013, leads 2009, defib portion has been turned to off . Mixed Alzheimer's and vascular dementia . Permanent atrial fibrillation ([place]) . Acute on chronic kidney failure ([place]) . Acute respiratory failure ([place]) . Fall at nursing home . Sepsis ([place]) . Hyperkalemia  Palliative Review of Systems:    -unable to illicit 2/2 to confusion    I have reviewed the medical record, interviewed the patient and  family, and examined the patient. The following aspects are pertinent.  Past Medical History  Diagnosis Date  . Hypertension   .  Ischemic heart disease   . Hyperlipemia   . Depression   . Testicular cancer ([place])   . Polio     child polio  . Rheumatic fever   . Mild dementia   . Mild dementia   . Systolic heart failure (H[facility])     acute on chronic  . CAD (coronary artery disease)   . Chronic atrial fibrillation ([place])   . Vitamin D deficiency   . Type II or unspecified type diabetes mellitus without mention of complication, not stated as uncontrolled   . Other testicular hypofunction   . Permanent atrial fibrillation ([place]) 01/21/2014  . ICD, biventricular, in situ - Medtronic Protecta 2013, leads 2009 01/21/2014    New Generator Medtronic [place]ett model number [id number], serial number [id number]  Right atrial lead (chronic) Medtronic, model number [id number], serial [id number] (implanted 05/01/2008)  Right ventricular lead (chronic) Medtronic, model number [id number], serial number [id number] (implanted 05/01/2008)  Left ventricular lead Medtronic, model number [id number], serial number [id number] (implanted 05/01/2008)  Exp  . Pleural effusion, right 03/18/2014  . Emphysema 03/18/2014  . Pulmonary infiltrate 05/10/2014    CT chest 03/2014:  RUL interstitial/GGO    Social History   Social History  . Marital Status: Married    Spouse Name: Judeth Porch  . Number of Children: 0  . Years of Education: 8th   Occupational History  . retired    Social History Main Topics  . Smoking status: Former Smoker    Quit date: 12/24/1968  . Smokeless tobacco: Never Used  . Alcohol Use: No  . Drug Use: No  . Sexual Activity: No   Other Topics Concern  . None   Social History Narrative   Patient lives at [facility].     Patient has 8th grade education   Patient is right handed.   Caffeine Use: Rarely   Family History  Problem Relation Age of Onset  . Other Mother   . Heart attack Father    Scheduled Meds: . aspirin EC  81 mg Oral Daily  . cefTRIAXone (ROCEPHIN)  IV  1 g Intravenous Q24H  . citalopram  20 mg Oral Daily  . donepezil   23 mg Oral QHS  . dorzolamide-timolol  1 drop Both Eyes BID  . heparin  5,000 Units Subcutaneous 3 times per day  . insulin aspart  0-9 Units Subcutaneous 6 times per day  . insulin detemir  5 Units Subcutaneous QHS  . levothyroxine  75 mcg Oral QAC breakfast  . mupirocin cream   Topical Daily  . polyvinyl alcohol  1 drop Both Eyes TID  . sodium chloride  1,000 mL Intravenous Once  . sodium chloride  3 mL Intravenous Q12H   Continuous Infusions:  PRN Meds:.bisacodyl, HYDROcodone-acetaminophen, levalbuterol, LORazepam, ondansetron **OR** ondansetron (ZOFRAN) IV Medications Prior to Admission:  Prior to Admission medications   Medication Sig Start Date End Date Taking? Authorizing Provider  acetaminophen (TYLENOL) 325 MG tablet Take 2 tablets (650 mg total) by mouth 3 (three) times daily. 08/25/15  Yes Isaiah Serge, NP  aspirin 81 MG tablet Take 81 mg by mouth daily.   Yes Historical Provider, MD  bisacodyl (DULCOLAX) 10 MG suppository Place 1 suppository (10 mg total) rectally daily as needed for moderate constipation. 04/10/15  Yes Velvet Bathe, MD  Cholecalciferol (VITAMIN D3) 2000 UNITS TABS Take 4,000 Units  by mouth daily.    Yes Historical Provider, MD  citalopram (CELEXA) 20 MG tablet Take 20 mg by mouth daily.   Yes Historical Provider, MD  Cyanocobalamin (VITAMIN B-12 PO) Take 1,000 mcg by mouth daily.    Yes Historical Provider, MD  donepezil (ARICEPT) 23 MG TABS tablet Take 1 tablet (23 mg total) by mouth at bedtime. 10/07/14  Yes Garvin Fila, MD  dorzolamide-timolol (COSOPT) 22.3-6.8 MG/ML ophthalmic solution Place 1 drop into both eyes 2 (two) times daily.   Yes Historical Provider, MD  Ferrous Fumarate 324 (106 FE) MG TABS Take 324 mg by mouth daily.   Yes Historical Provider, MD  HYDROcodone-acetaminophen (NORCO/VICODIN) 5-325 MG per tablet Take one tablet by mouth every four hours as needed for pain. Do not exceed 4grams of Tylenol in 24 hours 05/19/15  Yes Tiffany L Reed, DO   insulin detemir (LEVEMIR) 100 UNIT/ML injection Inject 0.05 mLs (5 Units total) into the skin at bedtime. 08/25/15  Yes Isaiah Serge, NP  insulin lispro (HUMALOG) 100 UNIT/ML injection Check blood sugar three times a day before meals with sliding scale 5 units for cbg 250-350 8 units for cbg >350-450 10 units for cbg>450   Yes Historical Provider, MD  isosorbide mononitrate (IMDUR) 30 MG 24 hr tablet Take 90 mg by mouth daily. For Hypertension   Yes Historical Provider, MD  KLOR-CON M10 10 MEQ tablet TAKE 1 TABLET TWICE DAILY 01/22/15  Yes Unk Pinto, MD  levalbuterol [facility]) 0.63 MG/3ML nebulizer solution Take 3 mLs (0.63 mg total) by nebulization every 6 (six) hours as needed for wheezing or shortness of breath. 08/25/15  Yes Isaiah Serge, NP  levothyroxine (SYNTHROID, LEVOTHROID) 75 MCG tablet TAKE 1 TABLET EVERY DAY 10/29/13  Yes Melissa Smith, PA-C  linagliptin (TRADJENTA) 5 MG TABS tablet Take 5 mg by mouth daily.   Yes Historical Provider, MD  lisinopril (PRINIVIL,ZESTRIL) 20 MG tablet Take 1 tablet (20 mg total) by mouth daily. 08/25/15  Yes Isaiah Serge, NP  loratadine (CLARITIN) 10 MG tablet Take 10 mg by mouth daily.   Yes Historical Provider, MD  LORazepam (ATIVAN) 0.5 MG tablet Take one tablet by mouth every night at bedtime for anxiety Patient taking differently: Take 0.5 mg by mouth at bedtime. Take one tablet by mouth every night at bedtime for anxiety 09/12/15  Yes Gildardo Cranker, DO  Metoprolol Tartrate 75 MG TABS Take 150 mg by mouth 2 (two) times daily. 08/25/15  Yes Isaiah Serge, NP  nystatin cream (MYCOSTATIN) Apply 1 application topically 2 (two) times daily. 08/26/15  Yes Historical Provider, MD  pravastatin (PRAVACHOL) 40 MG tablet TAKE 1 TABLET EVERY DAY FOR CHOLESTEROL 08/23/14  Yes Unk Pinto, MD  Propylene Glycol (SYSTANE BALANCE) 0.6 % SOLN Apply 1 drop to eye 3 (three) times daily.   Yes Historical Provider, MD  spironolactone (ALDACTONE) 25 MG tablet  Take 1 tablet (25 mg total) by mouth daily. 08/25/15  Yes Isaiah Serge, NP  traMADol (ULTRAM) 50 MG tablet Take one tablet by mouth twice daily for pain Patient taking differently: Take 50 mg by mouth 2 (two) times daily. Take one tablet by mouth twice daily for pain 04/16/15  Yes Gildardo Cranker, DO  Wheat Dextrin (BENEFIBER DRINK MIX PO) Take 1 Package by mouth 2 (two) times daily.   Yes Historical Provider, MD  furosemide (LASIX) 40 MG tablet Take 1 tablet (40 mg total) by mouth 2 (two) times daily. Patient not taking: Reported on  09/28/2015 08/25/15   Isaiah Serge, NP   Allergies  Allergen Reactions  . Lipitor [Atorvastatin]     weakness   CBC:    Component Value Date/Time   WBC 9.0 09/30/2015 0259   WBC 8.3 07/21/2015   HGB 13.9 09/30/2015 0259   HCT 42.4 09/30/2015 0259   PLT 128* 09/30/2015 0259   MCV 88.3 09/30/2015 0259   NEUTROABS 6.7 09/28/2015 1232   LYMPHSABS 1.2 09/28/2015 1232   MONOABS 0.9 09/28/2015 1232   EOSABS 0.2 09/28/2015 1232   BASOSABS 0.0 09/28/2015 1232   Comprehensive Metabolic Panel:    Component Value Date/Time   NA 143 09/30/2015 0259   NA 142 07/21/2015   K 4.4 09/30/2015 0259   CL 111 09/30/2015 0259   CO2 24 09/30/2015 0259   BUN 30* 09/30/2015 0259   BUN 16 07/21/2015   CREATININE 1.29* 09/30/2015 0259   CREATININE 1.0 07/21/2015   CREATININE 1.10 08/27/2014 1018   GLUCOSE 89 09/30/2015 0259   CALCIUM 9.3 09/30/2015 0259   AST 20 09/28/2015 1232   ALT 10* 09/28/2015 1232   ALKPHOS 90 09/28/2015 1232   BILITOT 0.7 09/28/2015 1232   PROT 6.0* 09/28/2015 1232   ALBUMIN 3.1* 09/28/2015 1232    Physical Exam:  Vital Signs: BP 156/79 mmHg  Pulse 25  Temp(Src) 98.7 F (37.1 C) (Oral)  Resp 10  Ht 5\' 7"  (1.702 m)  Wt 64.728 kg (142 lb 11.2 oz)  BMI 22.34 kg/m2  SpO2 96% SpO2: SpO2: 96 % O2 Device: O2 Device: Not Delivered O2 Flow Rate: O2 Flow Rate (L/min): 2.5 L/min Intake/output summary:  Intake/Output Summary (Last 24 hours)  at 09/30/15 1342 Last data filed at 09/30/15 0815  Gross per 24 hour  Intake    500 ml  Output    900 ml  Net   -400 ml   LBM:   Baseline Weight: Weight: 65.318 kg (144 lb) Most recent weight: Weight: 64.728 kg (142 lb 11.2 oz)  Exam Findings:   General: chronically ill appearing, pleasantly confused HEENT: moist buccal membranes, no exudate CVS: RRR Resp: decreased in bases Abd: soft NT +BS Extrem: c/o  Skin: warm and dry, noted skin breakdown EMR Neuro: pleasantly confused, able to follow simple commands, c/o BLE pain  (per wife this is chronic)           Palliative Performance Scale: 30 % at best                Additional Data Reviewed: Recent Labs     09/28/15  1232  09/28/15  1242  09/29/15  0315  09/30/15  0259  WBC  9.1   --    --   9.0  HGB  13.3  13.9   --   13.9  PLT  145*   --    --   128*  NA  133*  136  136  143  BUN  52*  50*  43*  30*  CREATININE  2.12*  2.10*  1.70*  1.29*     Time In: 1200 Time Out: 1315 Time Total: 75 min  Greater than 50%  of this time was spent counseling and coordinating care related to the above assessment and plan.  Discussed with  Dr  Waldron Labs  Signed by: Wadie Lessen, NP  [place]ox Royalty, NP  09/30/2015, 1:42 PM  Please contact Palliative Medicine Team phone at [phone] for questions and concerns.   See AMION for contact information

## 2015-09-30 NOTE — Progress Notes (Signed)
Patient Demographics  Troy Warren, is a 81 y.o. male, DOB - Jul 04, 1931, GNF:[id number]  Admit date - 09/28/2015   Admitting Physician Troy Morton, MD  Outpatient Primary MD for the patient is Troy Serve, MD  LOS - 2   Chief Complaint  Patient presents with  . Altered Mental Status       Admission HPI/Brief narrative: 81 y.o. Male with history of chronic combined systolic and diastolic heart failure, atrial fibrillation, AICD, hypertension, diabetes. Recent hospitalization for acute CHF He was diuresed with a 19 pound weight loss. Patient with an episode of unresponsiveness at SNF, CPR was initiated by SNF staff, found to have pulse by EMS, NAD patient was back at baseline, made DO NOT RESUSCITATE by ED physician, workup was significant for UTI, patient with significant dementia, has seen by palliative care who had a family meeting 10/18, sodium was made for full comfort measure.  Subjective:   Troy Warren today has, No headache, No chest pain, No abdominal pain - No Nausea,  No Cough - SOB.   Assessment & Plan    Principal Problem:   Sepsis ([place]) Active Problems:   CAD (coronary artery disease)   Permanent atrial fibrillation ([place])   ICD, biventricular, in situ - Medtronic Protecta 2013, leads 2009, defib portion has been turned to off   Hypertension associated with stage 3 chronic kidney disease due to type 2 diabetes mellitus (Troy Warren)   DM (diabetes mellitus) type II controlled with renal manifestation ([facility])   Mixed Alzheimer's and vascular dementia   Hypotension   Acute on chronic kidney failure ([place])   UTI (urinary tract infection)   Acute respiratory failure ([place])   Fall at nursing home   Pressure ulcer   Hyperkalemia  Sepsis secondary to UTI - Patient was hypotensive, hypothermic, negative chest x-ray, urinalysis significant for UTI. -Initially on Zosyn to Rocephin,  currently off antibiotic as he is comfort measures only .  Questionable cardiopulmonary arrest/more likely acute respiratory arrest - Unclear if patient lost pulse, currently is DO NOT RESUSCITATE, significant events on telemetry, requested cardiology to interrogate  AICD. - (His ICD (defibrillator) tachyarrhythmia therapies are "off", the BIV pacer will continue to pace him), as per cardiology recent discharge - Continue with oxygen as needed  Acute renal failure - Most likely related to ATN from hypotension, avoid nephrotoxic medication, continue with IV fluid, improving, no further labs   Hyperkalemia - Resolved,   Dementia - Continue with donepezial - Known advanced dementia , and confusion/delirium conveyed  message to staff  earlier during the day that he wants to die and is ready to go to heaven , patient is confused, bedridden, with no actual means to be able to hurt himself , very poor cognition and coherence ,   Severe ischemic cardiomyopathy with chronic combined systolic and diastolic heart failureno chest pain or shortness of breath this admission   Hypothyroidism   Diabetes mellitus   Atrial fibrillation - Not candidate for anticoagulation secondary to history of intracranial bleed  CAD , s/p CABG.  - No chest pain at presenr  Prognosis: Palliative care had a family meeting today, decision was made for full comfort measures.  Code Status: DO NOT RESUSCITATE  Family Communication:  him  at bedside  Disposition:   residential hospice versus SNF hospice  Procedures  None   Consult Palliative care  Scheduled Meds: . dorzolamide-timolol  1 drop Both Eyes BID  . insulin aspart  0-9 Units Subcutaneous 3 times per day  . morphine CONCENTRATE  5 mg Oral Q6H  . mupirocin cream   Topical Daily  . polyvinyl alcohol  1 drop Both Eyes TID  . sodium chloride  1,000 mL Intravenous Once  . sodium chloride  3 mL Intravenous Q12H   Continuous Infusions:  PRN  Meds:.bisacodyl, levalbuterol, LORazepam, morphine CONCENTRATE, ondansetron **OR** ondansetron (ZOFRAN) IV  DVT Prophylaxis   comfort care  Lab Results  Component Value Date   PLT 128* 09/30/2015    Antibiotics    Anti-infectives    Start     Dose/Rate Route Frequency Ordered Stop   09/29/15 1230  cefTRIAXone (ROCEPHIN) 1 g in dextrose 5 % 50 mL IVPB  Status:  Discontinued     1 g 100 mL/hr over 30 Minutes Intravenous Every 24 hours 09/29/15 1148 09/30/15 1345   09/29/15 0300  piperacillin-tazobactam (ZOSYN) IVPB 3.375 g  Status:  Discontinued     3.375 g 12.5 mL/hr over 240 Minutes Intravenous 3 times per day 09/28/15 1955 09/29/15 1148   09/28/15 2200  piperacillin-tazobactam (ZOSYN) IVPB 3.375 g  Status:  Discontinued     3.375 g 100 mL/hr over 30 Minutes Intravenous 3 times per day 09/28/15 1952 09/28/15 1953   09/28/15 2030  piperacillin-tazobactam (ZOSYN) IVPB 3.375 g     3.375 g 100 mL/hr over 30 Minutes Intravenous  Once 09/28/15 1955 09/28/15 2100   09/28/15 1615  cefTRIAXone (ROCEPHIN) 1 g in dextrose 5 % 50 mL IVPB     1 g 100 mL/hr over 30 Minutes Intravenous  Once 09/28/15 1602 09/28/15 1901          Objective:   Filed Vitals:   09/30/15 0416 09/30/15 0530 09/30/15 0815 09/30/15 1140  BP:  156/79    Pulse:  25    Temp: 99.4 F (37.4 C)   98.7 F (37.1 C)  TempSrc: Oral   Oral  Resp:  10    Height:      Weight: 64.728 kg (142 lb 11.2 oz)     SpO2:  99% 96%     Wt Readings from Last 3 Encounters:  09/30/15 64.728 kg (142 lb 11.2 oz)  09/09/15 65.726 kg (144 lb 14.4 oz)  08/25/15 74.118 kg (163 lb 6.4 oz)     Intake/Output Summary (Last 24 hours) at 09/30/15 1606 Last data filed at 09/30/15 0815  Gross per 24 hour  Intake    500 ml  Output    900 ml  Net   -400 ml     Physical Exam   awake, confused Symmetrical Chest wall movement, fair air movement bilaterally,  No Gallops,Rubs or new Murmurs,  +ve B.Sounds, Abd Soft, No  tenderness, No Cyanosis, Clubbing or edema, No new Rash or bruise     Data Review   Micro Results Recent Results (from the past 240 hour(s))  Culture, Urine     Status: None   Collection Time: 09/28/15  1:32 PM  Result Value Ref Range Status   Specimen Description URINE, RANDOM  Final   Special Requests NONE  Final   Culture >=100,000 COLONIES/mL PROTEUS MIRABILIS  Final   Report Status 09/30/2015 FINAL  Final   Organism ID, Bacteria PROTEUS MIRABILIS  Final  Susceptibility   Proteus mirabilis - MIC*    AMPICILLIN <=2 SENSITIVE Sensitive     CEFAZOLIN <=4 SENSITIVE Sensitive     CEFTRIAXONE <=1 SENSITIVE Sensitive     CIPROFLOXACIN >=4 RESISTANT Resistant     GENTAMICIN <=1 SENSITIVE Sensitive     IMIPENEM 4 SENSITIVE Sensitive     NITROFURANTOIN 128 RESISTANT Resistant     TRIMETH/SULFA <=20 SENSITIVE Sensitive     AMPICILLIN/SULBACTAM <=2 SENSITIVE Sensitive     PIP/TAZO <=4 SENSITIVE Sensitive     * >=100,000 COLONIES/mL PROTEUS MIRABILIS    Radiology Reports Ct Head Wo Contrast  09/28/2015  CLINICAL DATA:  Status post fall today with a hematoma about the left eye. Initial encounter. EXAM: CT HEAD WITHOUT CONTRAST TECHNIQUE: Contiguous axial images were obtained from the base of the skull through the vertex without intravenous contrast. COMPARISON:  Head CT scan 04/07/2015 and 03/22/2014. FINDINGS: The brain is atrophic with chronic microvascular ischemic change. Remote, large left MCA territory infarct is again seen. No evidence of acute infarction, hemorrhage, mass lesion, mass effect, midline shift or abnormal extra-axial fluid collection. No hydrocephalus or pneumocephalus. The calvarium is intact. Imaged paranasal sinuses and mastoid air cells are clear. IMPRESSION: No acute abnormality. Atrophy, chronic microvascular ischemic change remote left MCA infarct. Electronically Signed   By: Inge Rise M.D.   On: 09/28/2015 13:38   Dg Chest Port 1 View  09/28/2015   CLINICAL DATA:  Fall, found unresponsive EXAM: PORTABLE CHEST 1 VIEW COMPARISON:  04/07/2015 FINDINGS: Cardiomediastinal silhouette is stable. Status post CABG. 4leads cardiac pacemaker unchanged in position. No acute infiltrate or pleural effusion. No pulmonary edema. No pneumothorax. IMPRESSION: No active disease. Electronically Signed   By: Lahoma Crocker M.D.   On: 09/28/2015 13:14     CBC  Recent Labs Lab 09/28/15 1232 09/28/15 1242 09/30/15 0259  WBC 9.1  --  9.0  HGB 13.3 13.9 13.9  HCT 40.2 41.0 42.4  PLT 145*  --  128*  MCV 87.6  --  88.3  MCH 29.0  --  29.0  MCHC 33.1  --  32.8  RDW 15.6*  --  15.7*  LYMPHSABS 1.2  --   --   MONOABS 0.9  --   --   EOSABS 0.2  --   --   BASOSABS 0.0  --   --     Chemistries   Recent Labs Lab 09/28/15 1232 09/28/15 1242 09/29/15 0315 09/30/15 0259  NA 133* 136 136 143  K 5.3* 5.2* 4.6 4.4  CL 100* 103 104 111  CO2 25  --  24 24  GLUCOSE 219* 217* 104* 89  BUN 52* 50* 43* 30*  CREATININE 2.12* 2.10* 1.70* 1.29*  CALCIUM 9.0  --  8.8* 9.3  AST 20  --   --   --   ALT 10*  --   --   --   ALKPHOS 90  --   --   --   BILITOT 0.7  --   --   --    ------------------------------------------------------------------------------------------------------------------ estimated creatinine clearance is 41.1 mL/min (by C-G formula based on Cr of 1.29). ------------------------------------------------------------------------------------------------------------------ No results for input(s): HGBA1C in the last 72 hours. ------------------------------------------------------------------------------------------------------------------ No results for input(s): CHOL, HDL, LDLCALC, TRIG, CHOLHDL, LDLDIRECT in the last 72 hours. ------------------------------------------------------------------------------------------------------------------ No results for input(s): TSH, T4TOTAL, T3FREE, THYROIDAB in the last 72 hours.  Invalid input(s):  FREET3 ------------------------------------------------------------------------------------------------------------------ No results for input(s): VITAMINB12, FOLATE, FERRITIN, TIBC, IRON, RETICCTPCT in the last 72  hours.  Coagulation profile  Recent Labs Lab 09/28/15 2037  INR 1.16    No results for input(s): DDIMER in the last 72 hours.  Cardiac Enzymes No results for input(s): CKMB, TROPONINI, MYOGLOBIN in the last 168 hours.  Invalid input(s): CK ------------------------------------------------------------------------------------------------------------------ Invalid input(s): POCBNP     Time Spent in minutes   25 minutes   Danay Mckellar M.D on 09/30/2015 at 4:06 PM  Between 7am to 7pm - Pager - [phone]  After 7pm go to [email or web address] - password [email or web address]  [facility]   Office  [phone]

## 2015-09-30 NOTE — Progress Notes (Signed)
Pt stating that he wants to die, asking nursing staff and nursing tech to kill him, states he is ready to go on to heaven, message sent to MD, sleeping at present, have been unable to reach family members, will continue to monitor closely, Edward Qualia RN

## 2015-10-01 DIAGNOSIS — B964 Proteus (mirabilis) (morganii) as the cause of diseases classified elsewhere: Secondary | ICD-10-CM | POA: Diagnosis present

## 2015-10-01 DIAGNOSIS — F0150 Vascular dementia without behavioral disturbance: Secondary | ICD-10-CM

## 2015-10-01 DIAGNOSIS — A419 Sepsis, unspecified organism: Principal | ICD-10-CM

## 2015-10-01 DIAGNOSIS — N179 Acute kidney failure, unspecified: Secondary | ICD-10-CM

## 2015-10-01 DIAGNOSIS — I2510 Atherosclerotic heart disease of native coronary artery without angina pectoris: Secondary | ICD-10-CM

## 2015-10-01 DIAGNOSIS — I482 Chronic atrial fibrillation: Secondary | ICD-10-CM

## 2015-10-01 DIAGNOSIS — G309 Alzheimer's disease, unspecified: Secondary | ICD-10-CM

## 2015-10-01 DIAGNOSIS — N189 Chronic kidney disease, unspecified: Secondary | ICD-10-CM

## 2015-10-01 DIAGNOSIS — Z515 Encounter for palliative care: Secondary | ICD-10-CM

## 2015-10-01 DIAGNOSIS — N183 Chronic kidney disease, stage 3 (moderate): Secondary | ICD-10-CM

## 2015-10-01 DIAGNOSIS — Z794 Long term (current) use of insulin: Secondary | ICD-10-CM

## 2015-10-01 DIAGNOSIS — R627 Adult failure to thrive: Secondary | ICD-10-CM

## 2015-10-01 DIAGNOSIS — E1122 Type 2 diabetes mellitus with diabetic chronic kidney disease: Secondary | ICD-10-CM

## 2015-10-01 DIAGNOSIS — N390 Urinary tract infection, site not specified: Secondary | ICD-10-CM | POA: Diagnosis present

## 2015-10-01 DIAGNOSIS — R1084 Generalized abdominal pain: Secondary | ICD-10-CM

## 2015-10-01 DIAGNOSIS — J9601 Acute respiratory failure with hypoxia: Secondary | ICD-10-CM

## 2015-10-01 DIAGNOSIS — R52 Pain, unspecified: Secondary | ICD-10-CM | POA: Insufficient documentation

## 2015-10-01 DIAGNOSIS — F0280 Dementia in other diseases classified elsewhere without behavioral disturbance: Secondary | ICD-10-CM

## 2015-10-01 LAB — GLUCOSE, CAPILLARY
Glucose-Capillary: 173 mg/dL — ABNORMAL HIGH (ref 65–99)
Glucose-Capillary: 171 mg/dL — ABNORMAL HIGH (ref 65–99)

## 2015-10-01 MED ORDER — HYDROCODONE-ACETAMINOPHEN 5-325 MG PO TABS: ORAL_TABLET | ORAL | Status: DC

## 2015-10-01 MED ORDER — LORAZEPAM 0.5 MG PO TABS: ORAL_TABLET | ORAL | Status: AC

## 2015-10-01 NOTE — Discharge Summary (Addendum)
Physician Discharge Summary  Troy Warren JJK:[id number] DOB: October 15, 1931 DOA: 09/28/2015  PCP: Blanchie Serve, MD  Admit date: 09/28/2015 Discharge date: 10/01/2015  Time spent: < 30 minutes  Recommendations for Outpatient Follow-up:  1. Follow up with Hospice at SNF   Discharge Diagnoses:  Principal Problem:   Sepsis [facility]) Active Problems:   CAD (coronary artery disease)   Permanent atrial fibrillation ([place])   ICD, biventricular, in situ - Medtronic Protecta 2013, leads 2009, defib portion has been turned to off   Hypertension associated with stage 3 chronic kidney disease due to type 2 diabetes mellitus ([place])   DM (diabetes mellitus) type II controlled with renal manifestation ([facility])   Mixed Alzheimer's and vascular dementia   Hypotension   Acute on chronic kidney failure ([place])   UTI (urinary tract infection)   Acute respiratory failure ([facility])   Fall at nursing home   Pressure ulcer   Hyperkalemia   Palliative care encounter   Pain, abdominal, generalized   Pain, generalized   Adult failure to thrive   Urinary tract infection due to Proteus  Discharge Condition: guarded, with hospice  Diet recommendation: as tolerated  Filed Weights   09/28/15 1223 09/30/15 0416 10/01/15 0548  Weight: 65.318 kg (144 lb) 64.728 kg (142 lb 11.2 oz) 64.3 kg (141 lb 12.1 oz)   History of present illness:  BEATRICE SEHGAL is a 81 y.o. male with multiple medical problems not limited to chronic combined systolic and diastolic heart failure, atrial fibrillation, biventricular ICD placement, hypertension, diabetes. Patient was hospitalized early September for almost a week for acute on chronic combined systolic and diastolic heart failure. He was diuresed with a 19 pound weight loss. This morning patient was found by nursing home staff to be unresponsive / not breathing. Nursing home staff initiated CPR but when EMS arrived an patient determined to have a pulse, chest compressions were  discontinued. Bag/valve rescue breathing initiated, IVF started. Patient became responsive on way to ED. Wife, daughter and patient's sister at bedside. History comes from Epic records and family, patient has dementia and is confused at baseline. Patient has times where he is able to converse appropriately. He is able to recognize some family members. Lately patient has been sleeping excessively during the day. He has recently become unable to feed himself. Patient was ambulating with a walker to the bathroom but has not been able to do that the last 2 weeks. Yesterday patient was sitting in a wheelchair and of apparently stood up and then fail striking his head.   Hospital Course:  Sepsis secondary to UTI - Patient was hypotensive, hypothermic, negative chest x-ray, urinalysis significant for UTI, he was initially on Zosyn to Rocephin, currently off antibiotic as he is comfort measures only as below per family wishes and after discussions with Palliative care Questionable cardiopulmonary arrest/more likely acute respiratory arrest - Unclear if patient lost pulse, currently is DO NOT RESUSCITATE. (His ICD (defibrillator) tachyarrhythmia therapies are "off", the BIV pacer will continue to pace him), as per cardiology recent discharge Acute renal failure - Most likely related to ATN from hypotension Hyperkalemia - Resolved Dementia - Continue with donepezil, known advanced dementia Severe ischemic cardiomyopathy with chronic combined systolic and diastolic heart failure - no chest pain or shortness of breath this admission Hypothyroidism Diabetes mellitus Atrial fibrillation - Not candidate for anticoagulation secondary to history of intracranial bleed CAD , s/p CABG - No chest pain at presenr Goals of care: Palliative care had a family  meeting 10/18, decision was made for full comfort measures.  Procedures:  None    Consultations:  Palliative care  Discharge Exam: Filed Vitals:   09/30/15  0815 09/30/15 1140 09/30/15 2106 10/01/15 0548  BP:   139/79 159/80  Pulse:   94 84  Temp:  98.7 F (37.1 C) 98.6 F (37 C) 98.6 F (37 C)  TempSrc:  Oral Oral Oral  Resp:   18 18  Height:      Weight:    64.3 kg (141 lb 12.1 oz)  SpO2: 96%  96% 96%   General: NAD Cardiovascular: RRR Respiratory: CTA biL  Discharge Instructions     Medication List    TAKE these medications        acetaminophen 325 MG tablet  Commonly known as:  TYLENOL  Take 2 tablets (650 mg total) by mouth 3 (three) times daily.     aspirin 81 MG tablet  Take 81 mg by mouth daily.     BENEFIBER DRINK MIX PO  Take 1 Package by mouth 2 (two) times daily.     bisacodyl 10 MG suppository  Commonly known as:  DULCOLAX  Place 1 suppository (10 mg total) rectally daily as needed for moderate constipation.     citalopram 20 MG tablet  Commonly known as:  CELEXA  Take 20 mg by mouth daily.     donepezil 23 MG Tabs tablet  Commonly known as:  ARICEPT  Take 1 tablet (23 mg total) by mouth at bedtime.     dorzolamide-timolol 22.3-6.8 MG/ML ophthalmic solution  Commonly known as:  COSOPT  Place 1 drop into both eyes 2 (two) times daily.     Ferrous Fumarate 324 (106 FE) MG Tabs  Take 324 mg by mouth daily.     furosemide 40 MG tablet  Commonly known as:  LASIX  Take 1 tablet (40 mg total) by mouth 2 (two) times daily.     HYDROcodone-acetaminophen 5-325 MG tablet  Commonly known as:  NORCO/VICODIN  Take one tablet by mouth every four hours as needed for pain. Do not exceed 4grams of Tylenol in 24 hours     IMDUR 30 MG 24 hr tablet  Generic drug:  isosorbide mononitrate  Take 90 mg by mouth daily. For Hypertension     insulin detemir 100 UNIT/ML injection  Commonly known as:  LEVEMIR  Inject 0.05 mLs (5 Units total) into the skin at bedtime.     insulin lispro 100 UNIT/ML injection  Commonly known as:  HUMALOG  Check blood sugar three times a day before meals with sliding scale 5 units for  cbg 250-350 8 units for cbg >350-450 10 units for cbg>450     KLOR-CON M10 10 MEQ tablet  Generic drug:  potassium chloride  TAKE 1 TABLET TWICE DAILY     levalbuterol 0.63 MG/3ML nebulizer solution  Commonly known as:  XOPENEX  Take 3 mLs (0.63 mg total) by nebulization every 6 (six) hours as needed for wheezing or shortness of breath.     levothyroxine 75 MCG tablet  Commonly known as:  SYNTHROID, LEVOTHROID  TAKE 1 TABLET EVERY DAY     linagliptin 5 MG Tabs tablet  Commonly known as:  TRADJENTA  Take 5 mg by mouth daily.     lisinopril 20 MG tablet  Commonly known as:  PRINIVIL,ZESTRIL  Take 1 tablet (20 mg total) by mouth daily.     loratadine 10 MG tablet  Commonly known as:  CLARITIN  Take 10 mg by mouth daily.     LORazepam 0.5 MG tablet  Commonly known as:  ATIVAN  Take one tablet by mouth every night at bedtime for anxiety     Metoprolol Tartrate 75 MG Tabs  Take 150 mg by mouth 2 (two) times daily.     nystatin cream  Commonly known as:  MYCOSTATIN  Apply 1 application topically 2 (two) times daily.     pravastatin 40 MG tablet  Commonly known as:  PRAVACHOL  TAKE 1 TABLET EVERY DAY FOR CHOLESTEROL     spironolactone 25 MG tablet  Commonly known as:  ALDACTONE  Take 1 tablet (25 mg total) by mouth daily.     SYSTANE BALANCE 0.6 % Soln  Generic drug:  Propylene Glycol  Apply 1 drop to eye 3 (three) times daily.     traMADol 50 MG tablet  Commonly known as:  ULTRAM  Take one tablet by mouth twice daily for pain     VITAMIN B-12 PO  Take 1,000 mcg by mouth daily.     Vitamin D3 2000 UNITS Tabs  Take 4,000 Units by mouth daily.       Follow-up Information    Follow up with Blanchie Serve, MD. Schedule an appointment as soon as possible for a visit in 1 month.   Specialty:  Internal Medicine   Contact information:   [place] [address] [phone]       The results of significant diagnostics from this hospitalization  (including imaging, microbiology, ancillary and laboratory) are listed below for reference.    Significant Diagnostic Studies: Ct Head Wo Contrast  09/28/2015  CLINICAL DATA:  Status post fall today with a hematoma about the left eye. Initial encounter. EXAM: CT HEAD WITHOUT CONTRAST TECHNIQUE: Contiguous axial images were obtained from the base of the skull through the vertex without intravenous contrast. COMPARISON:  Head CT scan 04/07/2015 and 03/22/2014. FINDINGS: The brain is atrophic with chronic microvascular ischemic change. Remote, large left MCA territory infarct is again seen. No evidence of acute infarction, hemorrhage, mass lesion, mass effect, midline shift or abnormal extra-axial fluid collection. No hydrocephalus or pneumocephalus. The calvarium is intact. Imaged paranasal sinuses and mastoid air cells are clear. IMPRESSION: No acute abnormality. Atrophy, chronic microvascular ischemic change remote left MCA infarct. Electronically Signed   By: Inge Rise M.D.   On: 09/28/2015 13:38   Dg Chest Port 1 View  09/28/2015  CLINICAL DATA:  Fall, found unresponsive EXAM: PORTABLE CHEST 1 VIEW COMPARISON:  04/07/2015 FINDINGS: Cardiomediastinal silhouette is stable. Status post CABG. 4leads cardiac pacemaker unchanged in position. No acute infiltrate or pleural effusion. No pulmonary edema. No pneumothorax. IMPRESSION: No active disease. Electronically Signed   By: Lahoma Crocker M.D.   On: 09/28/2015 13:14    Microbiology: Recent Results (from the past 240 hour(s))  Culture, Urine     Status: None   Collection Time: 09/28/15  1:32 PM  Result Value Ref Range Status   Specimen Description URINE, RANDOM  Final   Special Requests NONE  Final   Culture >=100,000 COLONIES/mL PROTEUS MIRABILIS  Final   Report Status 09/30/2015 FINAL  Final   Organism ID, Bacteria PROTEUS MIRABILIS  Final      Susceptibility   Proteus mirabilis - MIC*    AMPICILLIN <=2 SENSITIVE Sensitive     CEFAZOLIN  <=4 SENSITIVE Sensitive     CEFTRIAXONE <=1 SENSITIVE Sensitive     CIPROFLOXACIN >=4 RESISTANT Resistant  GENTAMICIN <=1 SENSITIVE Sensitive     IMIPENEM 4 SENSITIVE Sensitive     NITROFURANTOIN 128 RESISTANT Resistant     TRIMETH/SULFA <=20 SENSITIVE Sensitive     AMPICILLIN/SULBACTAM <=2 SENSITIVE Sensitive     PIP/TAZO <=4 SENSITIVE Sensitive     * >=100,000 COLONIES/mL PROTEUS MIRABILIS     Labs: Basic Metabolic Panel:  Recent Labs Lab 09/28/15 1232 09/28/15 1242 09/29/15 0315 09/30/15 0259  NA 133* 136 136 143  K 5.3* 5.2* 4.6 4.4  CL 100* 103 104 111  CO2 25  --  24 24  GLUCOSE 219* 217* 104* 89  BUN 52* 50* 43* 30*  CREATININE 2.12* 2.10* 1.70* 1.29*  CALCIUM 9.0  --  8.8* 9.3   Liver Function Tests:  Recent Labs Lab 09/28/15 1232  AST 20  ALT 10*  ALKPHOS 90  BILITOT 0.7  PROT 6.0*  ALBUMIN 3.1*   CBC:  Recent Labs Lab 09/28/15 1232 09/28/15 1242 09/30/15 0259  WBC 9.1  --  9.0  NEUTROABS 6.7  --   --   HGB 13.3 13.9 13.9  HCT 40.2 41.0 42.4  MCV 87.6  --  88.3  PLT 145*  --  128*   BNP: BNP (last 3 results)  Recent Labs  09/28/15 2037  BNP 175.1*    ProBNP (last 3 results)  Recent Labs  10/24/14 0845  PROBNP 2991.0*    CBG:  Recent Labs Lab 09/30/15 1136 09/30/15 1647 09/30/15 2158 10/01/15 0753 10/01/15 1157  GLUCAP 131* 201* 255* 173* 171*    Signed:  Isadore Bokhari  [facility] 10/01/2015, 12:37 PM

## 2015-10-01 NOTE — Discharge Instructions (Signed)
Follow with Blanchie Serve, MD in 5-7 days  Please get a complete blood count and chemistry panel checked by your Primary MD at your next visit, and again as instructed by your Primary MD. Please get your medications reviewed and adjusted by your Primary MD.  Please request your Primary MD to go over all Hospital Tests and Procedure/Radiological results at the follow up, please get all Hospital records sent to your Prim MD by signing hospital release before you go home.  If you had Pneumonia of Lung problems at the Hospital: Please get a 2 view Chest X ray done in 6-8 weeks after hospital discharge or sooner if instructed by your Primary MD.  If you have Congestive Heart Failure: Please call your Cardiologist or Primary MD anytime you have any of the following symptoms:  1) 3 pound weight gain in 24 hours or 5 pounds in 1 week  2) shortness of breath, with or without a dry hacking cough  3) swelling in the hands, feet or stomach  4) if you have to sleep on extra pillows at night in order to breathe  Follow cardiac low salt diet and 1.5 lit/day fluid restriction.  If you have diabetes Accuchecks 4 times/day, Once in AM empty stomach and then before each meal. Log in all results and show them to your primary doctor at your next visit. If any glucose reading is under 80 or above 300 call your primary MD immediately.  If you have Seizure/Convulsions/Epilepsy: Please do not drive, operate heavy machinery, participate in activities at heights or participate in high speed sports until you have seen by Primary MD or a Neurologist and advised to do so again.  If you had Gastrointestinal Bleeding: Please ask your Primary MD to check a complete blood count within one week of discharge or at your next visit. Your endoscopic/colonoscopic biopsies that are pending at the time of discharge, will also need to followed by your Primary MD.  Get Medicines reviewed and adjusted. Please take all your  medications with you for your next visit with your Primary MD  Please request your Primary MD to go over all hospital tests and procedure/radiological results at the follow up, please ask your Primary MD to get all Hospital records sent to his/her office.  If you experience worsening of your admission symptoms, develop shortness of breath, life threatening emergency, suicidal or homicidal thoughts you must seek medical attention immediately by calling 911 or calling your MD immediately  if symptoms less severe.  You must read complete instructions/literature along with all the possible adverse reactions/side effects for all the Medicines you take and that have been prescribed to you. Take any new Medicines after you have completely understood and accpet all the possible adverse reactions/side effects.   Do not drive or operate heavy machinery when taking Pain medications.   Do not take more than prescribed Pain, Sleep and Anxiety Medications  Special Instructions: If you have smoked or chewed Tobacco  in the last 2 yrs please stop smoking, stop any regular Alcohol  and or any Recreational drug use.  Wear Seat belts while driving.  Please note You were cared for by a hospitalist during your hospital stay. If you have any questions about your discharge medications or the care you received while you were in the hospital after you are discharged, you can call the unit and asked to speak with the hospitalist on call if the hospitalist that took care of you is not available. Once  you are discharged, your primary care physician will handle any further medical issues. Please note that NO REFILLS for any discharge medications will be authorized once you are discharged, as it is imperative that you return to your primary care physician (or establish a relationship with a primary care physician if you do not have one) for your aftercare needs so that they can reassess your need for medications and monitor your  lab values.  You can reach the hospitalist office at phone [phone] or fax [phone]   If you do not have a primary care physician, you can call [phone] for a physician referral.  Activity: As tolerated with Full fall precautions use walker/cane & assistance as needed  Diet: as tolerated  Disposition SNF with hospice

## 2015-10-01 NOTE — Clinical Social Work Note (Signed)
Per MD patient ready to DC back to [facility] with hospice services. RN, patient/family Judeth Porch at bedside, and CHristine by phone), and facility notified of patient's DC. RN given number for report. DC packet on patient's chart. Ambulance transport requested for patient. CSW signing off at this time.   Liz Beach MSW, [place], [place], [phone]

## 2015-10-01 NOTE — Care Management Note (Signed)
Case Management Note  Patient Details  Name: ANTOINETTE HASKETT MRN: [id number] Date of Birth: 04-Sep-1931  Subjective/Objective:   Pt admitted for Sepsis. Plan for d/c to [place] with Hospice Services.                  Action/Plan: [place] to make referral with [place]. No needs identified for CM.    Expected Discharge Date:  09/30/15               Expected Discharge Plan:  Skilled Nursing Facility  In-House Referral:  Clinical Social Work  Discharge planning Services  CM Consult  Post Acute Care Choice:  NA Choice offered to:  NA  DME Arranged:  N/A DME Agency:  NA  HH Arranged:  NA HH Agency:  NA  Status of Service:     Medicare Important Message Given:  Yes-second notification given Date Medicare IM Given:    Medicare IM give by:    Date Additional Medicare IM Given:    Additional Medicare Important Message give by:     If discussed at [place] of Stay Meetings, dates discussed:    Additional Comments:  Bethena Roys, RN 10/01/2015, 2:42 PM

## 2015-10-01 NOTE — Progress Notes (Signed)
Inpatient Diabetes Program Recommendations  AACE/ADA: New Consensus Statement on Inpatient Glycemic Control (2015)  Target Ranges:  Prepandial:   less than 140 mg/dL      Peak postprandial:   less than 180 mg/dL (1-2 hours)      Critically ill patients:  140 - 180 mg/dL  Results for JEORGE, REISTER (MRN [id number]) as of 10/01/2015 10:32  Ref. Range 09/30/2015 07:59 09/30/2015 11:36 09/30/2015 16:47 09/30/2015 21:58 10/01/2015 07:53  Glucose-Capillary Latest Ref Range: 65-99 mg/dL 85 131 (H) 201 (H) 255 (H) 173 (H)   Review of Glycemic Control  Diabetes history: DM2 Outpatient Diabetes medications: Levemir 5 units QHS, Humalog 5-10 units TID with meals, Tradjenta 5 mg daily Current orders for Inpatient glycemic control: Novolog 0-9 units TID with meals  Inpatient Diabetes Program Recommendations: Insulin - Meal Coverage: Please consider ordering Novolog 3 units TID with meals for meal coverage (in addition to Novlog correction scale) if patient is eating at least 50% of meals.  Thanks, Barnie Alderman, RN, MSN, CCRN, CDE Diabetes Coordinator Inpatient Diabetes Program [phone] (Team Pager from [place] to [place]) [phone] ([facility] office) [phone] [facility] office) [phone] [facility] office)

## 2015-10-01 NOTE — Progress Notes (Signed)
Daily Progress Note   Patient Name: Troy Warren       Date: 10/01/2015 DOB: 08-19-1931  Age: 81 y.o. MRN#: [id number] Attending Physician: Caren Griffins, MD Primary Care Physician: Blanchie Serve, MD Admit Date: 09/28/2015  Reason for Consultation/Follow-up: Establishing goals of care  Subjective:     -family are hopeful for residential hospice placement, will write for choice  -focus of care is full comfort.  Patient has had dramatic physical, functional and cognitive decline over the past month, with recent hospitalization less than one month ago   Length of Stay: 3 days  Current Medications: Scheduled Meds:  . dorzolamide-timolol  1 drop Both Eyes BID  . insulin aspart  0-9 Units Subcutaneous 3 times per day  . morphine CONCENTRATE  5 mg Oral Q6H  . mupirocin cream   Topical Daily  . polyvinyl alcohol  1 drop Both Eyes TID  . sodium chloride  1,000 mL Intravenous Once  . sodium chloride  3 mL Intravenous Q12H    Continuous Infusions:    PRN Meds: bisacodyl, levalbuterol, LORazepam, morphine CONCENTRATE, ondansetron **OR** ondansetron (ZOFRAN) IV  Palliative Performance Scale: 30 % at best     Vital Signs: BP 159/80 mmHg  Pulse 84  Temp(Src) 98.6 F (37 C) (Oral)  Resp 18  Ht 5\' 7"  (1.702 m)  Wt 64.3 kg (141 lb 12.1 oz)  BMI 22.20 kg/m2  SpO2 96% SpO2: SpO2: 96 % O2 Device: O2 Device: Not Delivered O2 Flow Rate: O2 Flow Rate (L/min): 2.5 L/min  Intake/output summary:  Intake/Output Summary (Last 24 hours) at 10/01/15 1025 Last data filed at 10/01/15 0848  Gross per 24 hour  Intake    240 ml  Output   1100 ml  Net   -860 ml   LBM:   Baseline Weight: Weight: 65.318 kg (144 lb) Most recent weight: Weight: 64.3 kg (141 lb 12.1 oz)  Physical Exam:          General: ill appearing, pleasantly confused HEENT:  Moist buccal membranes, no exudate CVS: RRR Resp: decreased in bases Abd: soft NT +BS Skin: noted skin breakdown per EMR, warm and  dry    Additional Data Reviewed: Recent Labs     09/28/15  1232  09/28/15  1242  09/29/15  0315  09/30/15  0259  WBC  9.1   --    --   9.0  HGB  13.3  13.9   --   13.9  PLT  145*   --    --   128*  NA  133*  136  136  143  BUN  52*  50*  43*  30*  CREATININE  2.12*  2.10*  1.70*  1.29*     Problem List:  Patient Active Problem List   Diagnosis Date Noted  . Palliative care encounter 10/01/2015  . Pain, abdominal, generalized 10/01/2015  . Pain, generalized   . Hypotension 09/28/2015  . Acute on chronic kidney failure ([place]) 09/28/2015  . UTI (urinary tract infection) 09/28/2015  . Acute respiratory failure ([place]) 09/28/2015  . Fall at nursing home 09/28/2015  . Sepsis ([place]) 09/28/2015  . Pressure ulcer 09/28/2015  . Hyperkalemia 09/28/2015  . Acute on chronic combined systolic and diastolic heart failure ([place]) 08/19/2015  . Chronic combined systolic and diastolic CHF (congestive heart failure) ([place]) 04/08/2015  . Closed intratrochanteric fracture of right femur ([place]) 04/07/2015  . Closed right hip fracture ([place]) 04/07/2015  . Closed intertrochanteric fracture of  right femur ([place]) 04/07/2015  . Chronic systolic congestive heart failure ([place]) 11/11/2014  . Hypertension associated with stage 3 chronic kidney disease due to type 2 diabetes mellitus ([place]) 11/05/2014  . DM (diabetes mellitus) type II controlled with renal manifestation ([place]) 11/05/2014  . Depression with anxiety 11/05/2014  . Mixed Alzheimer's and vascular dementia 11/05/2014  . Mediastinal lymphadenopathy 05/10/2014  . Chronic kidney disease, stage III (moderate) 03/25/2014  . Acute on chronic systolic heart failure ([place]) 03/22/2014  . Hypothyroidism 03/22/2014  . Emphysema 03/18/2014  . Permanent atrial fibrillation ([place]) 01/21/2014  . ICD, biventricular, in situ - Medtronic Protecta 2013, leads 2009, defib portion has been turned to off 01/21/2014  . Vitamin D deficiency   . Hyperlipemia   . CAD  (coronary artery disease)      Palliative Care Assessment & Plan    Code Status:  DNR   Nursing checking to ensure ICD is deactivated  Goals of Care:   Focus of care is comfort and dignity, no further life prolonging measures. Avoid rehospitalization  Hospice benefit on discharge     Symptom Management:  Pain: Chronic generalized pain-Roxanol 5 mg po/sl every 6 hrs  Anticipatory Dyspnea and Pain: Roxanol 5 mg po/sl every 2 hrs prn     Prognosis: weeks to months  Discharge Planning: Hopeful for hospice facility vs SNF with hospice   Care plan was discussed with Dr Cruzita Lederer  Thank you for allowing the Palliative Medicine Team to assist in the care of this patient.   Time In: 1000 Time Out: 1025 Total Time 25 min Prolonged Time Billed  no     Greater than 50%  of this time was spent counseling and coordinating care related to the above assessment and plan.     Knox Royalty, NP  10/01/2015, 10:25 AM  Please contact Palliative Medicine Team phone at [phone] for questions and concerns.

## 2015-10-01 NOTE — Care Management Important Message (Signed)
Important Message  Patient Details  Name: Troy Warren MRN: [id number] Date of Birth: September 13, 1931   Medicare Important Message Given:  Yes-second notification given    Nathen May 10/01/2015, 10:08 AM

## 2015-10-02 ENCOUNTER — Non-Acute Institutional Stay (SKILLED_NURSING_FACILITY): Payer: Medicare Other | Admitting: Internal Medicine

## 2015-10-02 ENCOUNTER — Other Ambulatory Visit: Payer: Self-pay | Admitting: *Deleted

## 2015-10-02 DIAGNOSIS — F418 Other specified anxiety disorders: Secondary | ICD-10-CM | POA: Diagnosis not present

## 2015-10-02 DIAGNOSIS — I2510 Atherosclerotic heart disease of native coronary artery without angina pectoris: Secondary | ICD-10-CM | POA: Diagnosis not present

## 2015-10-02 DIAGNOSIS — F03C0 Unspecified dementia, severe, without behavioral disturbance, psychotic disturbance, mood disturbance, and anxiety: Secondary | ICD-10-CM

## 2015-10-02 DIAGNOSIS — I4821 Permanent atrial fibrillation: Secondary | ICD-10-CM

## 2015-10-02 DIAGNOSIS — E785 Hyperlipidemia, unspecified: Secondary | ICD-10-CM

## 2015-10-02 DIAGNOSIS — S72001S Fracture of unspecified part of neck of right femur, sequela: Secondary | ICD-10-CM | POA: Diagnosis not present

## 2015-10-02 DIAGNOSIS — R5381 Other malaise: Secondary | ICD-10-CM

## 2015-10-02 DIAGNOSIS — D638 Anemia in other chronic diseases classified elsewhere: Secondary | ICD-10-CM

## 2015-10-02 DIAGNOSIS — E1122 Type 2 diabetes mellitus with diabetic chronic kidney disease: Secondary | ICD-10-CM

## 2015-10-02 DIAGNOSIS — I5022 Chronic systolic (congestive) heart failure: Secondary | ICD-10-CM | POA: Diagnosis not present

## 2015-10-02 DIAGNOSIS — E039 Hypothyroidism, unspecified: Secondary | ICD-10-CM

## 2015-10-02 DIAGNOSIS — N183 Chronic kidney disease, stage 3 unspecified: Secondary | ICD-10-CM

## 2015-10-02 DIAGNOSIS — I878 Other specified disorders of veins: Secondary | ICD-10-CM

## 2015-10-02 DIAGNOSIS — E46 Unspecified protein-calorie malnutrition: Secondary | ICD-10-CM | POA: Diagnosis not present

## 2015-10-02 DIAGNOSIS — L97909 Non-pressure chronic ulcer of unspecified part of unspecified lower leg with unspecified severity: Secondary | ICD-10-CM

## 2015-10-02 DIAGNOSIS — I482 Chronic atrial fibrillation: Secondary | ICD-10-CM

## 2015-10-02 DIAGNOSIS — F0390 Unspecified dementia without behavioral disturbance: Secondary | ICD-10-CM

## 2015-10-02 DIAGNOSIS — I83009 Varicose veins of unspecified lower extremity with ulcer of unspecified site: Secondary | ICD-10-CM

## 2015-10-02 DIAGNOSIS — D696 Thrombocytopenia, unspecified: Secondary | ICD-10-CM

## 2015-10-02 DIAGNOSIS — I129 Hypertensive chronic kidney disease with stage 1 through stage 4 chronic kidney disease, or unspecified chronic kidney disease: Secondary | ICD-10-CM

## 2015-10-02 DIAGNOSIS — Z794 Long term (current) use of insulin: Secondary | ICD-10-CM

## 2015-10-02 MED ORDER — HYDROCODONE-ACETAMINOPHEN 5-325 MG PO TABS: ORAL_TABLET | ORAL | Status: AC

## 2015-10-02 NOTE — Progress Notes (Signed)
Patient ID: Troy Warren, male   DOB: 02-15-1931, 81 y.o.   MRN: [id number]     Facility: [facility]    PCP: Blanchie Serve, MD  Code Status: DNR  Allergies  Allergen Reactions  . Lipitor [Atorvastatin]     weakness    Chief Complaint  Patient presents with  . Readmit To SNF     HPI:  81 y.o. patient is here for long term care post hospital admission from 09/28/15-10/01/15 with unresponsiveness. CPR was initiated and pulse was restored. In the ED he was diagnosed with sepsis in setting of UTI. He was started on antibiotics but after family meeting and review of goals of care, antibiotics were discontinued and patient was seen by palliative care. He was noted to have acute renal failure from ATN and responded well to iv fluids. He has PMH of chronic combined systolic and diastolic heart failure, dementia, atrial fibrillation, biventricular ICD placement, hypertension, diabetes and hypothyroidism. He is seen in his room today. He minimally participates in HPI and ROS with his dementia. He has had bowel incontinence during history taking.    Review of Systems: from patient and nursing staff Constitutional: Negative for fever, chills HENT: Negative for headache Respiratory: Negative for cough.   Cardiovascular: Negative for chest pain  Gastrointestinal: Negative for heartburn, nausea, vomiting, abdominal pain  Musculoskeletal: no falls reported Skin: has venous ulcers    Past Medical History  Diagnosis Date  . Hypertension   . Ischemic heart disease   . Hyperlipemia   . Depression   . Testicular cancer ([place])   . Polio     child polio  . Rheumatic fever   . Mild dementia   . Mild dementia   . Systolic heart failure (HCC)     acute on chronic  . CAD (coronary artery disease)   . Chronic atrial fibrillation ([place])   . Vitamin D deficiency   . Type II or unspecified type diabetes mellitus without mention of complication, not stated as  uncontrolled   . Other testicular hypofunction   . Permanent atrial fibrillation ([place]) 01/21/2014  . ICD, biventricular, in situ - Medtronic Protecta 2013, leads 2009 01/21/2014    New Generator Medtronic [place] model number [id number], serial number [id number]  Right atrial lead (chronic) Medtronic, model number [id number], serial [id number] (implanted 05/01/2008)  Right ventricular lead (chronic) Medtronic, model number [id number], serial number [id number] (implanted 05/01/2008)  Left ventricular lead Medtronic, model number [id number], serial number [id number] (implanted 05/01/2008)  Exp  . Pleural effusion, right 03/18/2014  . Emphysema 03/18/2014  . Pulmonary infiltrate 05/10/2014    CT chest 03/2014:  RUL interstitial/GGO    Past Surgical History  Procedure Laterality Date  . Coronary artery bypass graft  06/26/2002    LIMA to diagonal artery & SVG to the first obtuse margin  . Nm myocar perf wall motion  09/15/2006    Dilated LV w/small area of possible nontransmural inferior scar w/mild perinfarct ischemia.  No significant change from previous study.  . Cardiac defibrillator placement  05/01/2008    Medtronic  . Biv icd genertaor change out N/A 10/27/2012    Procedure: BIV ICD GENERTAOR CHANGE OUT;  Surgeon: Sanda Klein, MD;  Location[facility] C[facility] LAB;  Service: Cardiovascular;  Laterality: N/A;  . Intramedullary (im) nail intertrochanteric Right 04/07/2015    Procedure: INTRAMEDULLARY (IM) NAIL INTERTROCHANTRIC;  Surgeon: Netta Cedars, MD;  Location: [facility];  Service: Orthopedics;  Laterality: Right;   Social History:  reports that he quit smoking about 46 years ago. He has never used smokeless tobacco. He reports that he does not drink alcohol or use illicit drugs.  Family History  Problem Relation Age of Onset  . Other Mother   . Heart attack Father     Medications:   Medication List       This list is accurate as of: 10/02/15  9:07 AM.  Always use your most recent med list.                acetaminophen 325 MG tablet  Commonly known as:  TYLENOL  Take 2 tablets (650 mg total) by mouth 3 (three) times daily.     aspirin 81 MG tablet  Take 81 mg by mouth daily.     BENEFIBER DRINK MIX PO  Take 1 Package by mouth 2 (two) times daily.     bisacodyl 10 MG suppository  Commonly known as:  DULCOLAX  Place 1 suppository (10 mg total) rectally daily as needed for moderate constipation.     citalopram 20 MG tablet  Commonly known as:  CELEXA  Take 20 mg by mouth daily.     donepezil 23 MG Tabs tablet  Commonly known as:  ARICEPT  Take 1 tablet (23 mg total) by mouth at bedtime.     dorzolamide-timolol 22.3-6.8 MG/ML ophthalmic solution  Commonly known as:  COSOPT  Place 1 drop into both eyes 2 (two) times daily.     Ferrous Fumarate 324 (106 FE) MG Tabs  Take 324 mg by mouth daily.     furosemide 40 MG tablet  Commonly known as:  LASIX  Take 1 tablet (40 mg total) by mouth 2 (two) times daily.     HYDROcodone-acetaminophen 5-325 MG tablet  Commonly known as:  NORCO/VICODIN  Take one tablet by mouth every four hours as needed for pain. Do not exceed 4grams of Tylenol in 24 hours     IMDUR 30 MG 24 hr tablet  Generic drug:  isosorbide mononitrate  Take 90 mg by mouth daily. For Hypertension     insulin detemir 100 UNIT/ML injection  Commonly known as:  LEVEMIR  Inject 0.05 mLs (5 Units total) into the skin at bedtime.     insulin lispro 100 UNIT/ML injection  Commonly known as:  HUMALOG  Check blood sugar three times a day before meals with sliding scale 5 units for cbg 250-350 8 units for cbg >350-450 10 units for cbg>450     KLOR-CON M10 10 MEQ tablet  Generic drug:  potassium chloride  TAKE 1 TABLET TWICE DAILY     levalbuterol 0.63 MG/3ML nebulizer solution  Commonly known as:  XOPENEX  Take 3 mLs (0.63 mg total) by nebulization every 6 (six) hours as needed for wheezing or shortness of breath.     levothyroxine 75 MCG tablet  Commonly known as:   SYNTHROID, LEVOTHROID  TAKE 1 TABLET EVERY DAY     linagliptin 5 MG Tabs tablet  Commonly known as:  TRADJENTA  Take 5 mg by mouth daily.     lisinopril 20 MG tablet  Commonly known as:  PRINIVIL,ZESTRIL  Take 1 tablet (20 mg total) by mouth daily.     loratadine 10 MG tablet  Commonly known as:  CLARITIN  Take 10 mg by mouth daily.     LORazepam 0.5 MG tablet  Commonly known as:  ATIVAN  Take one tablet by mouth every night at bedtime for anxiety  Metoprolol Tartrate 75 MG Tabs  Take 150 mg by mouth 2 (two) times daily.     nystatin cream  Commonly known as:  MYCOSTATIN  Apply 1 application topically 2 (two) times daily.     pravastatin 40 MG tablet  Commonly known as:  PRAVACHOL  TAKE 1 TABLET EVERY DAY FOR CHOLESTEROL     spironolactone 25 MG tablet  Commonly known as:  ALDACTONE  Take 1 tablet (25 mg total) by mouth daily.     SYSTANE BALANCE 0.6 % Soln  Generic drug:  Propylene Glycol  Apply 1 drop to eye 3 (three) times daily.     traMADol 50 MG tablet  Commonly known as:  ULTRAM  Take one tablet by mouth twice daily for pain     VITAMIN B-12 PO  Take 1,000 mcg by mouth daily.     Vitamin D3 2000 UNITS Tabs  Take 4,000 Units by mouth daily.         Physical Exam: Filed Vitals:   10/02/15 0905  BP: 111/66  Pulse: 72  Temp: 98.8 F (37.1 C)  Resp: 18  SpO2: 96%    General- elderly male, frail, in no acute distress Head- normocephalic, atraumatic Nose- no maxillary or frontal sinus tenderness, no nasal discharge Throat- moist mucus membrane Eyes- no pallor, no icterus, no discharge, normal conjunctiva, normal sclera Neck- no cervical lymphadenopathy Cardiovascular- irregular heart rate, no murmurs, palpable dorsalis pedis, trace leg edema + Respiratory- bilateral clear to auscultation, no wheeze, no rhonchi, no crackles, no use of accessory muscles Abdomen- bowel sounds present, soft, non tender Musculoskeletal- able to move all 4  extremities, generalized weakness Neurological- alert and oriented to self only Skin- warm and dry, venous ulcer in both legs, mild erythematous changes to lower extremity  Psychiatry- calm during visit but confused   Labs reviewed: Basic Metabolic Panel:  Recent Labs  09/28/15 1232 09/28/15 1242 09/29/15 0315 09/30/15 0259  NA 133* 136 136 143  K 5.3* 5.2* 4.6 4.4  CL 100* 103 104 111  CO2 25  --  24 24  GLUCOSE 219* 217* 104* 89  BUN 52* 50* 43* 30*  CREATININE 2.12* 2.10* 1.70* 1.29*  CALCIUM 9.0  --  8.8* 9.3   Liver Function Tests:  Recent Labs  10/24/14 0845  07/21/15 08/19/15 1930 09/28/15 1232  AST 10  < > 8* 15 20  ALT <5  < > 4* 7* 10*  ALKPHOS 68  < > 83 96 90  BILITOT 0.9  --   --  0.9 0.7  PROT 5.9*  --   --  6.2* 6.0*  ALBUMIN 3.1*  --   --  3.1* 3.1*  < > = values in this interval not displayed. No results for input(s): LIPASE, AMYLASE in the last 8760 hours. No results for input(s): AMMONIA in the last 8760 hours. CBC:  Recent Labs  10/24/14 0845  04/07/15 1832  08/20/15 0426 09/28/15 1232 09/28/15 1242 09/30/15 0259  WBC 8.2  < > 11.6*  < > 7.6 9.1  --  9.0  NEUTROABS 6.3  --  9.6*  --   --  6.7  --   --   HGB 13.6  < > 12.7*  < > 13.0 13.3 13.9 13.9  HCT 42.0  < > 38.0*  < > 41.3 40.2 41.0 42.4  MCV 88.2  --  85.6  < > 92.4 87.6  --  88.3  PLT 140*  < > 113*  < >  154 145*  --  128*  < > = values in this interval not displayed. Cardiac Enzymes:  Recent Labs  10/24/14 0845  TROPONINI <0.30   BNP: Invalid input(s): POCBNP CBG:  Recent Labs  09/30/15 2158 10/01/15 0753 10/01/15 1157  GLUCAP 255* 173* 171*    Radiological Exams: Ct Head Wo Contrast  09/28/2015  CLINICAL DATA:  Status post fall today with a hematoma about the left eye. Initial encounter. EXAM: CT HEAD WITHOUT CONTRAST TECHNIQUE: Contiguous axial images were obtained from the base of the skull through the vertex without intravenous contrast. COMPARISON:  Head  CT scan 04/07/2015 and 03/22/2014. FINDINGS: The brain is atrophic with chronic microvascular ischemic change. Remote, large left MCA territory infarct is again seen. No evidence of acute infarction, hemorrhage, mass lesion, mass effect, midline shift or abnormal extra-axial fluid collection. No hydrocephalus or pneumocephalus. The calvarium is intact. Imaged paranasal sinuses and mastoid air cells are clear. IMPRESSION: No acute abnormality. Atrophy, chronic microvascular ischemic change remote left MCA infarct. Electronically Signed   By: Inge Rise M.D.   On: 09/28/2015 13:38   Dg Chest Port 1 View  09/28/2015  CLINICAL DATA:  Fall, found unresponsive EXAM: PORTABLE CHEST 1 VIEW COMPARISON:  04/07/2015 FINDINGS: Cardiomediastinal silhouette is stable. Status post CABG. 4leads cardiac pacemaker unchanged in position. No acute infiltrate or pleural effusion. No pulmonary edema. No pneumothorax. IMPRESSION: No active disease. Electronically Signed   By: Lahoma Crocker M.D.   On: 09/28/2015 13:14    Assessment/Plan  Physical deconditioning Recent infection, medical co-morbidities and advanced dementia are all contributing to this. Will have him work with physical therapy and occupational therapy team to help with muscle strengthening exercises as tolerated. fall precautions. Skin care. Encourage to be out of bed. Reviewed discharge summary and will get hospice consult for now with comfort care being the main goal of care with his overall poor prognosis. D/c statin and vitamin b12 with no benefit forseen for now  Advanced dementia Decline anticipated. No acute behavioral changes. Currently on aricept which is not providing clinical benefit at present. With comfort cre being the main goal at present, discontinue aricept for now  ckd stage 3 Monitor renal function. With improved edema, decrease lasix to 40 mg daily for now. Monitor weight  Thrombocytopenia His recent sepsis and being on aspirin are  likely contributing to this. Monitor clinically for signs of bleeding  Protein calorie malnutrition Add procel 2 scoop bid with meals, assistance to be provided with meals, dietary consult and monitor weight  Chronic depression Continue celexa 20 mg daily  Anemia of chronic disease Hb stable at present. discontinue iron supplement and b12  HTN Stable bp, continue imdur 90 mg daily, lisinopril 20 mg daily and metoprolol 150 mg bid. Monitor bp  CAD Remains chest pain free. Continue imdur, liisnopril and metorpolol for now with baby aspirin  chf Appears euvolemic. Decreased lasix to 40 mg daily from bid for now with worsening renal function. Continue spironolactone, imdur, lisinopril and metoprolol  afib Rate controlled. Off anticoagulation with fall risk and hx of intracranial bleed. Continue metoprolol. Has ICD but has been turned off. His biventricular pacer will pace him as per cardiology notes  Closed right hip fracture [place], continue Ultram 50 mg bid and norco 5-325 mg q4h prn pain  Venous ulcer To both his legs, continue wound care and pain medication as above  HLD On pravastatin, discontinue it for now with comfort care being the main goal- to reduce  pill burden. Given his advanced dementia and medical co-morbidities, statin at present will not help improve quality of life  DM Lab Results  Component Value Date   HGBA1C 6.8* 07/21/2015  with last a1c of 6.8 suggestive of controlled diabetes and his age, goal a1c is <7. Patient is at goal. D/c tradjenta. Continue levemir for now and SSI. Can consider discontinuing levemir if po intake decreases  Hypothyroidism Continue levothyroxine 75 mcg daily   Goals of care: short term rehabilitation   Labs/tests ordered: cbc, cmp 1 week  Family/ staff Communication: reviewed care plan with patient and nursing supervisor    Blanchie Serve, MD  [facility] [phone] (Monday-Friday 8 am - 5 pm) [phone]  (afterhours)

## 2015-10-02 NOTE — Telephone Encounter (Signed)
[facility] 

## 2015-10-09 ENCOUNTER — Ambulatory Visit: Payer: Medicare Other | Admitting: Physician Assistant

## 2015-10-20 ENCOUNTER — Encounter: Payer: Self-pay | Admitting: Cardiovascular Disease

## 2015-10-21 ENCOUNTER — Encounter: Payer: Self-pay | Admitting: Cardiovascular Disease

## 2015-10-29 ENCOUNTER — Other Ambulatory Visit: Payer: Self-pay | Admitting: *Deleted

## 2015-10-29 MED ORDER — TRAMADOL HCL 50 MG PO TABS: ORAL_TABLET | ORAL | Status: AC

## 2015-10-29 NOTE — Telephone Encounter (Signed)
[facility] 

## 2015-11-17 ENCOUNTER — Encounter: Payer: Medicare Other | Admitting: Cardiovascular Disease

## 2015-11-24 ENCOUNTER — Encounter: Payer: Medicare Other | Admitting: Cardiovascular Disease

## 2015-11-26 ENCOUNTER — Telehealth: Payer: Self-pay | Admitting: Cardiovascular Disease

## 2015-11-26 ENCOUNTER — Telehealth: Payer: Self-pay | Admitting: Nurse Practitioner

## 2015-11-26 NOTE — Telephone Encounter (Signed)
Daughter Troy Warren called to let Dr. Leonie Man know that her father died last night around 8pm

## 2015-11-26 NOTE — Telephone Encounter (Signed)
Spoke with daughter.  He died in his sleep at the facility peacefully.  We were both in tears at the end of the conversation.  Will defer message to Dr. Loletha Grayer.

## 2015-11-26 NOTE — Telephone Encounter (Signed)
Very sorry to hear that. Glad it was a peaceful event

## 2015-11-26 NOTE — Telephone Encounter (Signed)
Thanks

## 2015-11-26 NOTE — Telephone Encounter (Signed)
Pt's daughter called in wanting Dr. Loletha Grayer to know that the pt passed away last night.   Thanks

## 2015-11-27 ENCOUNTER — Ambulatory Visit: Payer: Medicare Other | Admitting: Nurse Practitioner

## 2015-12-14 DEATH — deceased

## 2015-12-17 ENCOUNTER — Encounter: Payer: Medicare Other | Admitting: Cardiovascular Disease

## 2016-01-13 ENCOUNTER — Encounter: Payer: Medicare Other | Admitting: Cardiovascular Disease
